# Patient Record
Sex: Female | Born: 1960
Health system: Southern US, Community
[De-identification: ages and names within clinical notes are randomized; demographics above are authoritative.]

## PROBLEM LIST (undated history)

## (undated) DIAGNOSIS — I639 Cerebral infarction, unspecified: Secondary | ICD-10-CM

## (undated) DIAGNOSIS — M797 Fibromyalgia: Secondary | ICD-10-CM

## (undated) DIAGNOSIS — R569 Unspecified convulsions: Secondary | ICD-10-CM

## (undated) DIAGNOSIS — K219 Gastro-esophageal reflux disease without esophagitis: Secondary | ICD-10-CM

## (undated) DIAGNOSIS — J45909 Unspecified asthma, uncomplicated: Secondary | ICD-10-CM

## (undated) DIAGNOSIS — F988 Other specified behavioral and emotional disorders with onset usually occurring in childhood and adolescence: Secondary | ICD-10-CM

## (undated) DIAGNOSIS — J301 Allergic rhinitis due to pollen: Secondary | ICD-10-CM

## (undated) DIAGNOSIS — F329 Major depressive disorder, single episode, unspecified: Secondary | ICD-10-CM

## (undated) DIAGNOSIS — B370 Candidal stomatitis: Secondary | ICD-10-CM

## (undated) DIAGNOSIS — D649 Anemia, unspecified: Secondary | ICD-10-CM

## (undated) DIAGNOSIS — G039 Meningitis, unspecified: Secondary | ICD-10-CM

## (undated) DIAGNOSIS — H7290 Unspecified perforation of tympanic membrane, unspecified ear: Secondary | ICD-10-CM

## (undated) DIAGNOSIS — F3289 Other specified depressive episodes: Secondary | ICD-10-CM

## (undated) DIAGNOSIS — R51 Headache: Secondary | ICD-10-CM

## (undated) DIAGNOSIS — M654 Radial styloid tenosynovitis [de Quervain]: Secondary | ICD-10-CM

## (undated) DIAGNOSIS — T8859XA Other complications of anesthesia, initial encounter: Secondary | ICD-10-CM

## (undated) DIAGNOSIS — F419 Anxiety disorder, unspecified: Secondary | ICD-10-CM

## (undated) DIAGNOSIS — N6019 Diffuse cystic mastopathy of unspecified breast: Secondary | ICD-10-CM

## (undated) DIAGNOSIS — K589 Irritable bowel syndrome without diarrhea: Secondary | ICD-10-CM

## (undated) DIAGNOSIS — H4089 Other specified glaucoma: Secondary | ICD-10-CM

## (undated) DIAGNOSIS — R296 Repeated falls: Secondary | ICD-10-CM

## (undated) DIAGNOSIS — S8410XA Injury of peroneal nerve at lower leg level, unspecified leg, initial encounter: Secondary | ICD-10-CM

## (undated) DIAGNOSIS — T7840XA Allergy, unspecified, initial encounter: Secondary | ICD-10-CM

## (undated) DIAGNOSIS — W19XXXA Unspecified fall, initial encounter: Secondary | ICD-10-CM

## (undated) DIAGNOSIS — K602 Anal fissure, unspecified: Secondary | ICD-10-CM

## (undated) DIAGNOSIS — T4145XA Adverse effect of unspecified anesthetic, initial encounter: Secondary | ICD-10-CM

## (undated) DIAGNOSIS — B019 Varicella without complication: Secondary | ICD-10-CM

## (undated) DIAGNOSIS — H269 Unspecified cataract: Secondary | ICD-10-CM

## (undated) DIAGNOSIS — J069 Acute upper respiratory infection, unspecified: Secondary | ICD-10-CM

## (undated) DIAGNOSIS — Z8659 Personal history of other mental and behavioral disorders: Secondary | ICD-10-CM

## (undated) DIAGNOSIS — N63 Unspecified lump in unspecified breast: Secondary | ICD-10-CM

## (undated) HISTORY — DX: Headache: R51

## (undated) HISTORY — DX: Unspecified perforation of tympanic membrane, unspecified ear: H72.90

## (undated) HISTORY — DX: Other specified glaucoma: H40.89

## (undated) HISTORY — PX: EYE SURGERY: SHX253

## (undated) HISTORY — DX: Injury of peroneal nerve at lower leg level, unspecified leg, initial encounter: S84.10XA

## (undated) HISTORY — DX: Cerebral infarction, unspecified: I63.9

## (undated) HISTORY — DX: Unspecified lump in unspecified breast: N63.0

## (undated) HISTORY — PX: BREAST SURGERY: SHX581

## (undated) HISTORY — DX: Anal fissure, unspecified: K60.2

## (undated) HISTORY — DX: Unspecified convulsions: R56.9

## (undated) HISTORY — PX: ABDOMINAL HYSTERECTOMY: SHX81

## (undated) HISTORY — DX: Diffuse cystic mastopathy of unspecified breast: N60.19

## (undated) HISTORY — DX: Meningitis, unspecified: G03.9

## (undated) HISTORY — DX: Allergy, unspecified, initial encounter: T78.40XA

## (undated) HISTORY — PX: TUBAL LIGATION: SHX77

## (undated) HISTORY — PX: INNER EAR SURGERY: SHX679

## (undated) HISTORY — DX: Allergic rhinitis due to pollen: J30.1

## (undated) HISTORY — PX: ADENOIDECTOMY: SUR15

## (undated) HISTORY — DX: Unspecified asthma, uncomplicated: J45.909

## (undated) HISTORY — DX: Other specified depressive episodes: F32.89

## (undated) HISTORY — DX: Gastro-esophageal reflux disease without esophagitis: K21.9

## (undated) HISTORY — DX: Acute upper respiratory infection, unspecified: J06.9

## (undated) HISTORY — DX: Major depressive disorder, single episode, unspecified: F32.9

## (undated) HISTORY — DX: Personal history of other mental and behavioral disorders: Z86.59

## (undated) HISTORY — PX: COSMETIC SURGERY: SHX468

## (undated) HISTORY — DX: Candidal stomatitis: B37.0

## (undated) HISTORY — DX: Irritable bowel syndrome, unspecified: K58.9

## (undated) HISTORY — DX: Repeated falls: R29.6

## (undated) HISTORY — DX: Varicella without complication: B01.9

## (undated) HISTORY — DX: Fibromyalgia: M79.7

## (undated) HISTORY — DX: Unspecified cataract: H26.9

## (undated) HISTORY — DX: Unspecified fall, initial encounter: W19.XXXA

## (undated) HISTORY — DX: Radial styloid tenosynovitis (de quervain): M65.4

## (undated) HISTORY — DX: Anxiety disorder, unspecified: F41.9

## (undated) HISTORY — DX: Other specified behavioral and emotional disorders with onset usually occurring in childhood and adolescence: F98.8

## (undated) HISTORY — DX: Anemia, unspecified: D64.9

---

## 1990-02-20 HISTORY — PX: ANKLE SURGERY: SHX546

## 1991-02-21 HISTORY — PX: LAPAROSCOPY: SHX197

## 1997-06-08 ENCOUNTER — Ambulatory Visit (HOSPITAL_COMMUNITY): Admission: RE | Admit: 1997-06-08 | Discharge: 1997-06-08 | Payer: Self-pay | Admitting: Gynecology

## 1999-02-21 HISTORY — PX: WRIST SURGERY: SHX841

## 2004-02-21 HISTORY — PX: DILATION AND CURETTAGE OF UTERUS: SHX78

## 2006-01-23 LAB — CONVERTED CEMR LAB: Pap Smear: NORMAL

## 2006-05-28 ENCOUNTER — Ambulatory Visit: Payer: Self-pay | Admitting: Internal Medicine

## 2006-05-28 LAB — CONVERTED CEMR LAB
ALT: 10 units/L (ref 0–40)
AST: 19 units/L (ref 0–37)
Albumin: 3.7 g/dL (ref 3.5–5.2)
Alkaline Phosphatase: 32 units/L — ABNORMAL LOW (ref 39–117)
BUN: 12 mg/dL (ref 6–23)
Basophils Absolute: 0.1 10*3/uL (ref 0.0–0.1)
Basophils Relative: 1.4 % — ABNORMAL HIGH (ref 0.0–1.0)
Bilirubin Urine: NEGATIVE
Bilirubin, Direct: 0.1 mg/dL (ref 0.0–0.3)
CO2: 28 meq/L (ref 19–32)
Calcium: 9.3 mg/dL (ref 8.4–10.5)
Chloride: 109 meq/L (ref 96–112)
Cholesterol: 135 mg/dL (ref 0–200)
Creatinine, Ser: 0.9 mg/dL (ref 0.4–1.2)
Crystals: NEGATIVE
Eosinophils Absolute: 0.3 10*3/uL (ref 0.0–0.6)
Eosinophils Relative: 6.2 % — ABNORMAL HIGH (ref 0.0–5.0)
GFR calc Af Amer: 87 mL/min
GFR calc non Af Amer: 72 mL/min
Glucose, Bld: 93 mg/dL (ref 70–99)
HCT: 40 % (ref 36.0–46.0)
HDL: 50.3 mg/dL (ref >39.0)
Hemoglobin: 13.8 g/dL (ref 12.0–15.0)
Ketones, ur: NEGATIVE mg/dL
LDL Cholesterol: 65 mg/dL (ref 0–99)
Leukocytes, UA: NEGATIVE
Lymphocytes Relative: 27.7 % (ref 12.0–46.0)
MCHC: 34.6 g/dL (ref 30.0–36.0)
MCV: 97 fL (ref 78.0–100.0)
Monocytes Absolute: 0.5 10*3/uL (ref 0.2–0.7)
Monocytes Relative: 9.5 % (ref 3.0–11.0)
Neutro Abs: 2.6 10*3/uL (ref 1.4–7.7)
Neutrophils Relative %: 55.2 % (ref 43.0–77.0)
Nitrite: NEGATIVE
Platelets: 199 10*3/uL (ref 150–400)
Potassium: 3.9 meq/L (ref 3.5–5.1)
RBC: 4.12 M/uL (ref 3.87–5.11)
RDW: 12.1 % (ref 11.5–14.6)
Sodium: 140 meq/L (ref 135–145)
Specific Gravity, Urine: 1.025 (ref 1.000–1.03)
TSH: 2.15 microintl units/mL (ref 0.35–5.50)
Total Bilirubin: 0.3 mg/dL (ref 0.3–1.2)
Total CHOL/HDL Ratio: 2.7
Total Protein, Urine: NEGATIVE mg/dL
Total Protein: 6.2 g/dL (ref 6.0–8.3)
Triglycerides: 97 mg/dL (ref 0–149)
Urine Glucose: NEGATIVE mg/dL
Urobilinogen, UA: 0.2 (ref 0.0–1.0)
VLDL: 19 mg/dL (ref 0–40)
WBC: 4.8 10*3/uL (ref 4.5–10.5)
pH: 6 (ref 5.0–8.0)

## 2006-06-04 ENCOUNTER — Ambulatory Visit: Payer: Self-pay | Admitting: Internal Medicine

## 2006-07-10 ENCOUNTER — Ambulatory Visit: Payer: Self-pay | Admitting: Internal Medicine

## 2006-09-27 ENCOUNTER — Ambulatory Visit: Payer: Self-pay | Admitting: Internal Medicine

## 2006-09-27 LAB — CONVERTED CEMR LAB
Amylase: 40 units/L (ref 27–131)
Basophils Absolute: 0 10*3/uL (ref 0.0–0.1)
Basophils Relative: 0.7 % (ref 0.0–1.0)
Eosinophils Absolute: 0.3 10*3/uL (ref 0.0–0.6)
Eosinophils Relative: 4 % (ref 0.0–5.0)
HCT: 41.9 % (ref 36.0–46.0)
Hemoglobin: 14.7 g/dL (ref 12.0–15.0)
Lipase: 21 units/L (ref 11.0–59.0)
Lymphocytes Relative: 23.6 % (ref 12.0–46.0)
MCHC: 35 g/dL (ref 30.0–36.0)
MCV: 96.4 fL (ref 78.0–100.0)
Monocytes Absolute: 0.7 10*3/uL (ref 0.2–0.7)
Monocytes Relative: 9.6 % (ref 3.0–11.0)
Neutro Abs: 4.3 10*3/uL (ref 1.4–7.7)
Neutrophils Relative %: 62.1 % (ref 43.0–77.0)
Platelets: 219 10*3/uL (ref 150–400)
RBC: 4.35 M/uL (ref 3.87–5.11)
RDW: 12 % (ref 11.5–14.6)
WBC: 7 10*3/uL (ref 4.5–10.5)

## 2006-10-18 ENCOUNTER — Encounter: Admission: RE | Admit: 2006-10-18 | Discharge: 2006-10-18 | Payer: Self-pay | Admitting: Internal Medicine

## 2006-12-05 ENCOUNTER — Ambulatory Visit: Payer: Self-pay | Admitting: Internal Medicine

## 2006-12-05 ENCOUNTER — Encounter: Payer: Self-pay | Admitting: Internal Medicine

## 2006-12-05 DIAGNOSIS — H4089 Other specified glaucoma: Secondary | ICD-10-CM | POA: Insufficient documentation

## 2006-12-05 DIAGNOSIS — F339 Major depressive disorder, recurrent, unspecified: Secondary | ICD-10-CM | POA: Insufficient documentation

## 2006-12-05 DIAGNOSIS — N6019 Diffuse cystic mastopathy of unspecified breast: Secondary | ICD-10-CM | POA: Insufficient documentation

## 2006-12-05 DIAGNOSIS — Z8659 Personal history of other mental and behavioral disorders: Secondary | ICD-10-CM | POA: Insufficient documentation

## 2006-12-05 DIAGNOSIS — Z9189 Other specified personal risk factors, not elsewhere classified: Secondary | ICD-10-CM | POA: Insufficient documentation

## 2006-12-05 DIAGNOSIS — F329 Major depressive disorder, single episode, unspecified: Secondary | ICD-10-CM

## 2006-12-05 DIAGNOSIS — H7290 Unspecified perforation of tympanic membrane, unspecified ear: Secondary | ICD-10-CM | POA: Insufficient documentation

## 2006-12-05 DIAGNOSIS — S8410XA Injury of peroneal nerve at lower leg level, unspecified leg, initial encounter: Secondary | ICD-10-CM | POA: Insufficient documentation

## 2006-12-05 DIAGNOSIS — G039 Meningitis, unspecified: Secondary | ICD-10-CM | POA: Insufficient documentation

## 2006-12-05 DIAGNOSIS — R519 Headache, unspecified: Secondary | ICD-10-CM | POA: Insufficient documentation

## 2006-12-05 DIAGNOSIS — D649 Anemia, unspecified: Secondary | ICD-10-CM | POA: Insufficient documentation

## 2006-12-05 DIAGNOSIS — Z9089 Acquired absence of other organs: Secondary | ICD-10-CM | POA: Insufficient documentation

## 2006-12-05 DIAGNOSIS — R51 Headache: Secondary | ICD-10-CM

## 2006-12-05 DIAGNOSIS — M654 Radial styloid tenosynovitis [de Quervain]: Secondary | ICD-10-CM | POA: Insufficient documentation

## 2006-12-05 HISTORY — DX: Major depressive disorder, recurrent, unspecified: F33.9

## 2006-12-05 HISTORY — DX: Unspecified perforation of tympanic membrane, unspecified ear: H72.90

## 2006-12-11 ENCOUNTER — Encounter: Admission: RE | Admit: 2006-12-11 | Discharge: 2006-12-11 | Payer: Self-pay | Admitting: Internal Medicine

## 2006-12-21 ENCOUNTER — Encounter: Payer: Self-pay | Admitting: Internal Medicine

## 2007-01-22 ENCOUNTER — Telehealth: Payer: Self-pay | Admitting: Internal Medicine

## 2007-01-22 ENCOUNTER — Ambulatory Visit: Payer: Self-pay | Admitting: Internal Medicine

## 2007-01-22 LAB — CONVERTED CEMR LAB: Streptococcus, Group A Screen (Direct): NEGATIVE

## 2007-02-09 ENCOUNTER — Telehealth (INDEPENDENT_AMBULATORY_CARE_PROVIDER_SITE_OTHER): Payer: Self-pay | Admitting: *Deleted

## 2007-02-18 DIAGNOSIS — J301 Allergic rhinitis due to pollen: Secondary | ICD-10-CM | POA: Insufficient documentation

## 2007-02-19 ENCOUNTER — Ambulatory Visit: Payer: Self-pay | Admitting: Internal Medicine

## 2007-02-19 DIAGNOSIS — M542 Cervicalgia: Secondary | ICD-10-CM

## 2007-02-19 HISTORY — DX: Cervicalgia: M54.2

## 2007-02-22 ENCOUNTER — Telehealth: Payer: Self-pay | Admitting: Internal Medicine

## 2007-03-22 ENCOUNTER — Telehealth: Payer: Self-pay | Admitting: Internal Medicine

## 2007-04-01 ENCOUNTER — Telehealth: Payer: Self-pay | Admitting: Internal Medicine

## 2007-04-25 ENCOUNTER — Ambulatory Visit: Payer: Self-pay | Admitting: Internal Medicine

## 2007-04-25 DIAGNOSIS — F410 Panic disorder [episodic paroxysmal anxiety] without agoraphobia: Secondary | ICD-10-CM | POA: Insufficient documentation

## 2007-04-25 DIAGNOSIS — G5600 Carpal tunnel syndrome, unspecified upper limb: Secondary | ICD-10-CM

## 2007-04-25 DIAGNOSIS — F40240 Claustrophobia: Secondary | ICD-10-CM | POA: Insufficient documentation

## 2007-04-25 HISTORY — DX: Carpal tunnel syndrome, unspecified upper limb: G56.00

## 2007-04-26 ENCOUNTER — Telehealth: Payer: Self-pay | Admitting: Internal Medicine

## 2007-05-15 ENCOUNTER — Encounter: Payer: Self-pay | Admitting: Internal Medicine

## 2007-06-25 ENCOUNTER — Telehealth: Payer: Self-pay | Admitting: Internal Medicine

## 2007-06-25 ENCOUNTER — Encounter: Payer: Self-pay | Admitting: Internal Medicine

## 2007-06-26 ENCOUNTER — Encounter: Payer: Self-pay | Admitting: Internal Medicine

## 2007-07-03 ENCOUNTER — Encounter: Admission: RE | Admit: 2007-07-03 | Discharge: 2007-07-03 | Payer: Self-pay | Admitting: Internal Medicine

## 2007-07-03 ENCOUNTER — Encounter (INDEPENDENT_AMBULATORY_CARE_PROVIDER_SITE_OTHER): Payer: Self-pay | Admitting: *Deleted

## 2007-07-04 ENCOUNTER — Telehealth: Payer: Self-pay | Admitting: Internal Medicine

## 2007-07-09 ENCOUNTER — Telehealth: Payer: Self-pay | Admitting: Internal Medicine

## 2007-07-10 ENCOUNTER — Telehealth (INDEPENDENT_AMBULATORY_CARE_PROVIDER_SITE_OTHER): Payer: Self-pay | Admitting: *Deleted

## 2007-07-12 ENCOUNTER — Encounter: Payer: Self-pay | Admitting: Internal Medicine

## 2007-07-22 ENCOUNTER — Ambulatory Visit (HOSPITAL_COMMUNITY): Admission: RE | Admit: 2007-07-22 | Discharge: 2007-07-22 | Payer: Self-pay | Admitting: Neurology

## 2007-09-16 ENCOUNTER — Encounter: Payer: Self-pay | Admitting: Gastroenterology

## 2007-09-23 ENCOUNTER — Encounter: Payer: Self-pay | Admitting: Internal Medicine

## 2007-10-02 ENCOUNTER — Encounter: Payer: Self-pay | Admitting: Internal Medicine

## 2007-10-17 ENCOUNTER — Encounter: Admission: RE | Admit: 2007-10-17 | Discharge: 2007-10-17 | Payer: Self-pay | Admitting: Psychiatry

## 2007-11-20 ENCOUNTER — Telehealth: Payer: Self-pay | Admitting: Internal Medicine

## 2007-11-21 ENCOUNTER — Telehealth: Payer: Self-pay | Admitting: Internal Medicine

## 2007-12-05 ENCOUNTER — Telehealth: Payer: Self-pay | Admitting: Internal Medicine

## 2007-12-06 ENCOUNTER — Ambulatory Visit: Payer: Self-pay | Admitting: Internal Medicine

## 2007-12-26 ENCOUNTER — Telehealth: Payer: Self-pay | Admitting: Internal Medicine

## 2008-01-01 ENCOUNTER — Encounter: Payer: Self-pay | Admitting: Internal Medicine

## 2008-02-03 ENCOUNTER — Telehealth: Payer: Self-pay | Admitting: Internal Medicine

## 2008-02-24 ENCOUNTER — Ambulatory Visit: Payer: Self-pay | Admitting: Internal Medicine

## 2008-02-24 LAB — CONVERTED CEMR LAB
ALT: 13 units/L (ref 0–35)
AST: 21 units/L (ref 0–37)
Albumin: 3.9 g/dL (ref 3.5–5.2)
Alkaline Phosphatase: 33 units/L — ABNORMAL LOW (ref 39–117)
BUN: 10 mg/dL (ref 6–23)
Basophils Absolute: 0 10*3/uL (ref 0.0–0.1)
Basophils Relative: 1 % (ref 0.0–3.0)
Bilirubin Urine: NEGATIVE
Bilirubin, Direct: 0.1 mg/dL (ref 0.0–0.3)
CO2: 29 meq/L (ref 19–32)
Calcium: 8.7 mg/dL (ref 8.4–10.5)
Chloride: 109 meq/L (ref 96–112)
Cholesterol: 133 mg/dL (ref 0–200)
Creatinine, Ser: 1 mg/dL (ref 0.4–1.2)
Eosinophils Absolute: 0.2 10*3/uL (ref 0.0–0.7)
Eosinophils Relative: 4.7 % (ref 0.0–5.0)
GFR calc Af Amer: 76 mL/min
GFR calc non Af Amer: 63 mL/min
Glucose, Bld: 92 mg/dL (ref 70–99)
HCT: 38.2 % (ref 36.0–46.0)
HDL: 54.5 mg/dL (ref >39.0)
Hemoglobin, Urine: NEGATIVE
Hemoglobin: 13.5 g/dL (ref 12.0–15.0)
Ketones, ur: NEGATIVE mg/dL
LDL Cholesterol: 67 mg/dL (ref 0–99)
Leukocytes, UA: NEGATIVE
Lymphocytes Relative: 23.3 % (ref 12.0–46.0)
MCHC: 35.4 g/dL (ref 30.0–36.0)
MCV: 98.8 fL (ref 78.0–100.0)
Monocytes Absolute: 0.4 10*3/uL (ref 0.1–1.0)
Monocytes Relative: 9.1 % (ref 3.0–12.0)
Neutro Abs: 3.2 10*3/uL (ref 1.4–7.7)
Neutrophils Relative %: 61.9 % (ref 43.0–77.0)
Nitrite: NEGATIVE
Platelets: 164 10*3/uL (ref 150–400)
Potassium: 4.2 meq/L (ref 3.5–5.1)
RBC: 3.87 M/uL (ref 3.87–5.11)
RDW: 11.6 % (ref 11.5–14.6)
Sodium: 142 meq/L (ref 135–145)
Specific Gravity, Urine: 1.01 (ref 1.000–1.03)
TSH: 2.07 microintl units/mL (ref 0.35–5.50)
Total Bilirubin: 0.5 mg/dL (ref 0.3–1.2)
Total CHOL/HDL Ratio: 2.4
Total Protein, Urine: NEGATIVE mg/dL
Total Protein: 6.1 g/dL (ref 6.0–8.3)
Triglycerides: 60 mg/dL (ref 0–149)
Urine Glucose: NEGATIVE mg/dL
Urobilinogen, UA: 0.2 (ref 0.0–1.0)
VLDL: 12 mg/dL (ref 0–40)
WBC: 4.9 10*3/uL (ref 4.5–10.5)
pH: 7 (ref 5.0–8.0)

## 2008-02-28 ENCOUNTER — Encounter: Payer: Self-pay | Admitting: Internal Medicine

## 2008-02-28 ENCOUNTER — Other Ambulatory Visit: Admission: RE | Admit: 2008-02-28 | Discharge: 2008-02-28 | Payer: Self-pay | Admitting: Internal Medicine

## 2008-02-28 ENCOUNTER — Ambulatory Visit: Payer: Self-pay | Admitting: Internal Medicine

## 2008-02-28 LAB — HM PAP SMEAR

## 2008-03-04 ENCOUNTER — Telehealth: Payer: Self-pay | Admitting: Internal Medicine

## 2008-03-08 ENCOUNTER — Encounter: Payer: Self-pay | Admitting: Internal Medicine

## 2008-03-20 ENCOUNTER — Telehealth: Payer: Self-pay | Admitting: Internal Medicine

## 2008-04-07 ENCOUNTER — Encounter: Payer: Self-pay | Admitting: Internal Medicine

## 2008-04-13 ENCOUNTER — Telehealth: Payer: Self-pay | Admitting: Internal Medicine

## 2008-04-16 ENCOUNTER — Ambulatory Visit: Payer: Self-pay | Admitting: Internal Medicine

## 2008-04-28 ENCOUNTER — Emergency Department (HOSPITAL_COMMUNITY): Admission: EM | Admit: 2008-04-28 | Discharge: 2008-04-28 | Payer: Self-pay | Admitting: Emergency Medicine

## 2008-04-28 ENCOUNTER — Telehealth: Payer: Self-pay | Admitting: Internal Medicine

## 2008-06-06 ENCOUNTER — Encounter: Admission: RE | Admit: 2008-06-06 | Discharge: 2008-06-06 | Payer: Self-pay | Admitting: *Deleted

## 2008-06-30 ENCOUNTER — Telehealth: Payer: Self-pay | Admitting: Internal Medicine

## 2008-07-27 ENCOUNTER — Encounter: Payer: Self-pay | Admitting: Internal Medicine

## 2008-07-28 ENCOUNTER — Telehealth: Payer: Self-pay | Admitting: Internal Medicine

## 2008-08-10 ENCOUNTER — Telehealth (INDEPENDENT_AMBULATORY_CARE_PROVIDER_SITE_OTHER): Payer: Self-pay | Admitting: *Deleted

## 2008-08-11 ENCOUNTER — Ambulatory Visit: Payer: Self-pay | Admitting: Internal Medicine

## 2008-08-27 ENCOUNTER — Ambulatory Visit: Payer: Self-pay | Admitting: Internal Medicine

## 2008-08-27 ENCOUNTER — Telehealth: Payer: Self-pay | Admitting: Internal Medicine

## 2008-08-27 LAB — CONVERTED CEMR LAB
HCT: 43.6 % (ref 36.0–46.0)
Hemoglobin: 15.2 g/dL — ABNORMAL HIGH (ref 12.0–15.0)

## 2008-09-12 ENCOUNTER — Encounter: Admission: RE | Admit: 2008-09-12 | Discharge: 2008-09-12 | Payer: Self-pay | Admitting: *Deleted

## 2008-10-14 ENCOUNTER — Telehealth: Payer: Self-pay | Admitting: Internal Medicine

## 2008-10-22 ENCOUNTER — Ambulatory Visit: Payer: Self-pay | Admitting: Internal Medicine

## 2008-10-28 ENCOUNTER — Telehealth (INDEPENDENT_AMBULATORY_CARE_PROVIDER_SITE_OTHER): Payer: Self-pay | Admitting: *Deleted

## 2008-12-24 ENCOUNTER — Ambulatory Visit: Payer: Self-pay | Admitting: Internal Medicine

## 2009-02-20 HISTORY — PX: OTHER SURGICAL HISTORY: SHX169

## 2009-03-03 ENCOUNTER — Encounter: Payer: Self-pay | Admitting: Internal Medicine

## 2009-04-02 ENCOUNTER — Encounter: Payer: Self-pay | Admitting: Internal Medicine

## 2009-05-06 ENCOUNTER — Encounter: Payer: Self-pay | Admitting: Internal Medicine

## 2009-05-10 ENCOUNTER — Telehealth: Payer: Self-pay | Admitting: Internal Medicine

## 2009-05-13 ENCOUNTER — Encounter: Payer: Self-pay | Admitting: Internal Medicine

## 2009-05-14 ENCOUNTER — Telehealth: Payer: Self-pay | Admitting: Internal Medicine

## 2009-05-14 DIAGNOSIS — J330 Polyp of nasal cavity: Secondary | ICD-10-CM | POA: Insufficient documentation

## 2009-05-14 HISTORY — DX: Polyp of nasal cavity: J33.0

## 2009-05-20 ENCOUNTER — Encounter: Payer: Self-pay | Admitting: Internal Medicine

## 2009-06-01 ENCOUNTER — Ambulatory Visit: Payer: Self-pay | Admitting: Internal Medicine

## 2009-06-01 DIAGNOSIS — N3942 Incontinence without sensory awareness: Secondary | ICD-10-CM

## 2009-06-01 HISTORY — DX: Incontinence without sensory awareness: N39.42

## 2009-06-02 DIAGNOSIS — G35 Multiple sclerosis: Secondary | ICD-10-CM | POA: Insufficient documentation

## 2009-06-03 ENCOUNTER — Telehealth: Payer: Self-pay | Admitting: Internal Medicine

## 2009-06-03 ENCOUNTER — Encounter: Payer: Self-pay | Admitting: Internal Medicine

## 2009-06-03 LAB — CONVERTED CEMR LAB
5-HIAA, 24 Hr Urine: 1.8 mg/(24.h) (ref <=6.0)
Metaneph Total, Ur: 124 ug/(24.h) — ABNORMAL LOW (ref 182–739)
Metanephrines, Ur: 21 — ABNORMAL LOW (ref 58–203)
Normetanephrine, 24H Ur: 103 (ref 88–649)

## 2009-06-07 ENCOUNTER — Telehealth: Payer: Self-pay | Admitting: Internal Medicine

## 2009-06-07 ENCOUNTER — Ambulatory Visit: Payer: Self-pay | Admitting: Internal Medicine

## 2009-06-07 LAB — CONVERTED CEMR LAB
Bilirubin Urine: NEGATIVE
Hemoglobin, Urine: NEGATIVE
Ketones, ur: NEGATIVE mg/dL
Leukocytes, UA: NEGATIVE
Nitrite: NEGATIVE
Specific Gravity, Urine: 1.015 (ref 1.000–1.030)
Total Protein, Urine: NEGATIVE mg/dL
Urine Glucose: NEGATIVE mg/dL
Urobilinogen, UA: 0.2 (ref 0.0–1.0)
pH: 7 (ref 5.0–8.0)

## 2009-06-10 ENCOUNTER — Telehealth: Payer: Self-pay | Admitting: Internal Medicine

## 2009-06-13 ENCOUNTER — Encounter: Payer: Self-pay | Admitting: Internal Medicine

## 2009-06-16 ENCOUNTER — Telehealth: Payer: Self-pay | Admitting: Internal Medicine

## 2009-06-20 ENCOUNTER — Encounter: Admission: RE | Admit: 2009-06-20 | Discharge: 2009-06-20 | Payer: Self-pay | Admitting: Psychiatry

## 2009-06-21 ENCOUNTER — Ambulatory Visit: Payer: Self-pay | Admitting: Internal Medicine

## 2009-06-21 DIAGNOSIS — R1031 Right lower quadrant pain: Secondary | ICD-10-CM | POA: Insufficient documentation

## 2009-06-21 LAB — CONVERTED CEMR LAB
BUN: 11 mg/dL (ref 6–23)
CO2: 30 meq/L (ref 19–32)
Calcium: 9.2 mg/dL (ref 8.4–10.5)
Chloride: 106 meq/L (ref 96–112)
Creatinine, Ser: 0.9 mg/dL (ref 0.4–1.2)
GFR calc non Af Amer: 70.76 mL/min (ref >60)
Glucose, Bld: 75 mg/dL (ref 70–99)
Potassium: 4.9 meq/L (ref 3.5–5.1)
Sodium: 141 meq/L (ref 135–145)

## 2009-06-23 ENCOUNTER — Ambulatory Visit: Payer: Self-pay | Admitting: Cardiology

## 2009-06-25 ENCOUNTER — Telehealth: Payer: Self-pay | Admitting: Internal Medicine

## 2009-06-29 ENCOUNTER — Encounter (INDEPENDENT_AMBULATORY_CARE_PROVIDER_SITE_OTHER): Payer: Self-pay | Admitting: *Deleted

## 2009-06-30 ENCOUNTER — Encounter: Payer: Self-pay | Admitting: Internal Medicine

## 2009-07-16 ENCOUNTER — Encounter: Payer: Self-pay | Admitting: Internal Medicine

## 2009-07-28 ENCOUNTER — Encounter: Payer: Self-pay | Admitting: Internal Medicine

## 2009-07-28 ENCOUNTER — Telehealth: Payer: Self-pay | Admitting: Internal Medicine

## 2009-07-29 ENCOUNTER — Telehealth: Payer: Self-pay | Admitting: Internal Medicine

## 2009-08-02 ENCOUNTER — Ambulatory Visit: Payer: Self-pay | Admitting: Gastroenterology

## 2009-08-02 ENCOUNTER — Telehealth: Payer: Self-pay | Admitting: Internal Medicine

## 2009-08-02 DIAGNOSIS — R110 Nausea: Secondary | ICD-10-CM | POA: Insufficient documentation

## 2009-08-03 ENCOUNTER — Encounter: Payer: Self-pay | Admitting: Internal Medicine

## 2009-08-03 ENCOUNTER — Telehealth: Payer: Self-pay | Admitting: Internal Medicine

## 2009-08-03 ENCOUNTER — Ambulatory Visit (HOSPITAL_COMMUNITY): Admission: RE | Admit: 2009-08-03 | Discharge: 2009-08-03 | Payer: Self-pay | Admitting: Internal Medicine

## 2009-08-03 ENCOUNTER — Encounter: Payer: Self-pay | Admitting: Gastroenterology

## 2009-08-04 ENCOUNTER — Telehealth: Payer: Self-pay | Admitting: Internal Medicine

## 2009-08-05 ENCOUNTER — Encounter: Payer: Self-pay | Admitting: Internal Medicine

## 2009-08-05 ENCOUNTER — Telehealth: Payer: Self-pay | Admitting: Internal Medicine

## 2009-08-05 ENCOUNTER — Encounter: Admission: RE | Admit: 2009-08-05 | Discharge: 2009-08-05 | Payer: Self-pay | Admitting: Internal Medicine

## 2009-08-08 ENCOUNTER — Emergency Department (HOSPITAL_COMMUNITY): Admission: EM | Admit: 2009-08-08 | Discharge: 2009-08-08 | Payer: Self-pay | Admitting: Emergency Medicine

## 2009-08-09 ENCOUNTER — Telehealth: Payer: Self-pay | Admitting: Internal Medicine

## 2009-08-10 ENCOUNTER — Telehealth: Payer: Self-pay | Admitting: Gastroenterology

## 2009-08-10 ENCOUNTER — Telehealth: Payer: Self-pay | Admitting: Internal Medicine

## 2009-08-11 ENCOUNTER — Telehealth: Payer: Self-pay | Admitting: Internal Medicine

## 2009-08-16 ENCOUNTER — Telehealth: Payer: Self-pay | Admitting: Internal Medicine

## 2009-08-19 ENCOUNTER — Telehealth: Payer: Self-pay | Admitting: Internal Medicine

## 2009-08-20 ENCOUNTER — Ambulatory Visit: Payer: Self-pay | Admitting: Internal Medicine

## 2009-08-20 LAB — CONVERTED CEMR LAB: T pallidum Antibodies (TP-PA): 0.05 (ref <0.90)

## 2009-09-01 ENCOUNTER — Encounter: Payer: Self-pay | Admitting: Internal Medicine

## 2009-09-06 ENCOUNTER — Telehealth: Payer: Self-pay | Admitting: Internal Medicine

## 2009-09-13 ENCOUNTER — Ambulatory Visit: Payer: Self-pay | Admitting: Internal Medicine

## 2009-09-20 ENCOUNTER — Encounter: Payer: Self-pay | Admitting: Internal Medicine

## 2009-09-21 ENCOUNTER — Encounter: Payer: Self-pay | Admitting: Internal Medicine

## 2009-09-23 ENCOUNTER — Telehealth: Payer: Self-pay | Admitting: Internal Medicine

## 2009-09-29 ENCOUNTER — Telehealth: Payer: Self-pay | Admitting: Gastroenterology

## 2009-10-19 ENCOUNTER — Telehealth: Payer: Self-pay | Admitting: Internal Medicine

## 2009-11-22 ENCOUNTER — Encounter: Admission: RE | Admit: 2009-11-22 | Discharge: 2009-11-22 | Payer: Self-pay | Admitting: Otolaryngology

## 2010-01-05 ENCOUNTER — Encounter: Payer: Self-pay | Admitting: Internal Medicine

## 2010-01-31 ENCOUNTER — Encounter
Admission: RE | Admit: 2010-01-31 | Discharge: 2010-01-31 | Payer: Self-pay | Source: Home / Self Care | Attending: Internal Medicine | Admitting: Internal Medicine

## 2010-01-31 LAB — HM MAMMOGRAPHY: HM Mammogram: NEGATIVE

## 2010-02-03 ENCOUNTER — Telehealth: Payer: Self-pay | Admitting: Internal Medicine

## 2010-02-17 ENCOUNTER — Ambulatory Visit
Admission: RE | Admit: 2010-02-17 | Discharge: 2010-02-17 | Payer: Self-pay | Source: Home / Self Care | Attending: Internal Medicine | Admitting: Internal Medicine

## 2010-03-12 ENCOUNTER — Encounter: Payer: Self-pay | Admitting: Internal Medicine

## 2010-03-22 NOTE — Consult Note (Signed)
[facility]: [facility]   Imported By: Sherian Rein 09/07/2009 11:33:53  _____________________________________________________________________  External Attachment:    Type:   Image     Comment:   External Document

## 2010-03-22 NOTE — Assessment & Plan Note (Signed)
Summary: WANTS A SHOT IN HER NECK FOR PAIN/NWS   Vital Signs:  Patient profile:   50 year old female Height:      62 inches Weight:      117 pounds BMI:     21.48 O2 Sat:      98 % on Room air Temp:     98.5 degrees F oral Pulse rate:   66 / minute BP sitting:   106 / 72  (left arm) Cuff size:   regular  Vitals Entered By: Bill Salinas CMA (September 13, 2009 4:21 PM)  O2 Flow:  Room air CC: pt here with c/o neck pain/ ab   Primary Care Provider:  Bellany Elbaum  CC:  pt here with c/o neck pain/ ab.  History of Present Illness: patient presents for pain and spasm in her right trapezius area. This is a recurrent problem that has responded in the past to trigger point injections, which is what she seeks today.  She has become more calm. We repeated her RPR which was negative. Her neurologist has ordered a third set of labs. He has also prescribed penicillin for her. She tells me that she will be seeking s a reconsult from Dr. Sandria Manly for second opinion regarding her potential for MS.   Current Medications (verified): 1)  Wellbutrin Xl 300 Mg Xr24h-Tab (Bupropion Hcl) .... Take 1 Tablet By Mouth Once A Day 2)  Lexapro 20 Mg Tabs (Escitalopram Oxalate) .... Take 1 Tablet Once Daily 3)  Lunesta 3 Mg Tabs (Eszopiclone) .... Take 1 Tab By Mouth At Bedtime 4)  Multivitamins   Tabs (Multiple Vitamin) .... Take One Tablet Once Daily 5)  Lybrel 90-20 Mcg  Tabs (Levonorgestrel-Ethinyl Estrad) .... Take As Directed 6)  Alprazolam 0.5 Mg Tabs (Alprazolam) .Marland Kitchen.. 1 Tablet By Mouth Three Times A Day 7)  Gabapentin 300 Mg Caps (Gabapentin) .... Take 1 Tablet By Mouth Three Times A Day and 2 Tabs At Bedtime 8)  Savella 50 Mg Tabs (Milnacipran Hcl) .Marland Kitchen.. 1 Tablet By Mouth Two Times A Day 9)  Hydrocodone-Acetaminophen 5-325 Mg Tabs (Hydrocodone-Acetaminophen) .... Take 1 Tablet By Mouth Two Times A Day As Needed 10)  Baclofen 10 Mg Tabs (Baclofen) .... Take 1 Tab Q 8hrs As Needed For Muscle Spasms 11)  Provigil 200  Mg Tabs (Modafinil) .... 2 Tablet Once Daily As Needed 12)  Clonidine Hcl 0.1 Mg/24hr Ptwk (Clonidine Hcl) .Marland Kitchen.. 1 Patch Weekly As Directed 13)  Promethazine Hcl 12.5 Mg Tabs (Promethazine Hcl) .Marland Kitchen.. 1 Q 6 As Needed Nausea 14)  Tamsulosin Hcl 0.4 Mg Caps (Tamsulosin Hcl) .Marland Kitchen.. 1 Tab Daily  Allergies (verified): No Known Drug Allergies  Past History:  Past Medical History: Last updated: 08/02/2009 HEADACHE (ICD-784.0) DEPRESSION (ICD-311) ANEMIA-NOS (ICD-285.9) Hx of BULIMIA, HX OF (ICD-V11.8) Hx of ANOREXIA, HX OF (ICD-V15.89) MENINGITIS NOS (ICD-322.9) GLAUCOMA NEC (ICD-365.89) HAY FEVER (ICD-477.0) UPPER RESPIRATORY INFECTION (URI) (ICD-465.9) FIBROCYSTIC BREAST DISEASE (ICD-610.1) LUMP OR MASS IN BREAST (ICD-611.72) TYMPANIC MEMBRANE PERFORATION (ICD-384.20) MENINGITIS NOS (ICD-322.9) GLAUCOMA NEC (ICD-365.89) Anal Fissure Anxiety Disorder Fibromyalgia Irritable Bowel Syndrome Seizures  Past Surgical History: Last updated: 02/18/2007 DILATION AND CURETTAGE, HX OF FOR MENOMETRORRHAGIA (ICD-V45.89) DE QUERVAIN'S TENOSYNOVITIS (ICD-727.04) INJURY, PERONEAL NERVE (ICD-956.3) ADENOIDECTOMY, HX OF (ICD-V45.79) TYMPANIC MEMBRANE PERFORATION (ICD-384.20)  Family History: Last updated: March 07, 2007 father-died 43, CHF,DM, HTN, LIPIDs, CVD mother - died 34, CAD/MI,HTN, ovarian cancer Maternal grandmother with breast cancer Neg- colon cancer.  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 No children No h/o abuse. Patient has never smoked.  Alcohol Use - yes 2-4/day Daily Caffeine Use 2/day Illicit Drug Use - no  Review of Systems       The patient complains of muscle weakness.  The patient denies anorexia, fever, weight loss, vision loss, chest pain, syncope, dyspnea on exertion, prolonged cough, abdominal pain, severe indigestion/heartburn, suspicious skin lesions,  difficulty walking, abnormal bleeding, and enlarged lymph nodes.    Physical Exam  General:  Well-developed,well-nourished,in no acute distress; alert,appropriate and cooperative throughout examination Eyes:  pupils equal, pupils round, corneas and lenses clear, and no injection.   Lungs:  normal respiratory effort and normal breath sounds.   Heart:  normal rate and regular rhythm.   Msk:  tender at the right trapezium region. No joint deformity noted.  Pulses:  2+ radial Neurologic:  alert & oriented X3 and gait normal.   Skin:  turgor normal and color normal.   Cervical Nodes:  no anterior cervical adenopathy and no posterior cervical adenopathy.   Psych:  Oriented X3, good eye contact, and not anxious appearing.     Impression & Recommendations:  Problem # 1:  NECK PAIN, CHRONIC (ICD-723.1)  did well with trigger pont injection.   Her updated medication list for this problem includes:    Hydrocodone-acetaminophen 5-325 Mg Tabs (Hydrocodone-acetaminophen) .Marland Kitchen... Take 1 tablet by mouth two times a day as needed    Baclofen 10 Mg Tabs (Baclofen) .Marland Kitchen... Take 1 tab q 8hrs as needed for muscle spasms  Orders: Trigger Point Injection (1 or 2 muscles) (11914) Depo- Medrol 40mg  (J1030)  Problem # 2:  MULTIPLE SCLEROSIS, PROGRESSIVE/RELAPSING (ICD-340) Records to Dr. Sandria Manly.   Complete Medication List: 1)  Wellbutrin Xl 300 Mg Xr24h-tab (Bupropion hcl) .... Take 1 tablet by mouth once a day 2)  Lexapro 20 Mg Tabs (Escitalopram oxalate) .... Take 1 tablet once daily 3)  Lunesta 3 Mg Tabs (Eszopiclone) .... Take 1 tab by mouth at bedtime 4)  Multivitamins Tabs (Multiple vitamin) .... Take one tablet once daily 5)  Lybrel 90-20 Mcg Tabs (Levonorgestrel-ethinyl estrad) .... Take as directed 6)  Alprazolam 0.5 Mg Tabs (Alprazolam) .Marland Kitchen.. 1 tablet by mouth three times a day 7)  Gabapentin 300 Mg Caps (Gabapentin) .... Take 1 tablet by mouth three times a day and 2 tabs at bedtime 8)  Savella 50  Mg Tabs (Milnacipran hcl) .Marland Kitchen.. 1 tablet by mouth two times a day 9)  Hydrocodone-acetaminophen 5-325 Mg Tabs (Hydrocodone-acetaminophen) .... Take 1 tablet by mouth two times a day as needed 10)  Baclofen 10 Mg Tabs (Baclofen) .... Take 1 tab q 8hrs as needed for muscle spasms 11)  Provigil 200 Mg Tabs (Modafinil) .... 2 tablet once daily as needed 12)  Clonidine Hcl 0.1 Mg/24hr Ptwk (Clonidine hcl) .Marland Kitchen.. 1 patch weekly as directed 13)  Promethazine Hcl 12.5 Mg Tabs (Promethazine hcl) .Marland Kitchen.. 1 q 6 as needed nausea 14)  Tamsulosin Hcl 0.4 Mg Caps (Tamsulosin hcl) .Marland Kitchen.. 1 tab daily   Procedure Note Last Tetanus: Historical (04/21/2005)  Injections: The patient complains of pain. Indication: acute pain  Procedure # 1: trigger point injection    Location: right trapezius region    Technique: 24 g needle    Medication: 40 mg depomedrol    Anesthesia: 2% xylocain    Comment: verbal consent obtained  Cleaned and prepped with: betadine Wound dressing: bandaid

## 2010-03-22 NOTE — Progress Notes (Signed)
Summary: Head injury  Phone Note Call from Patient Call back at Work Phone [phone] Call back at work [phone]   Caller: Patient Call For: Jacques Navy MD Summary of Call: Pt c/o headache, swelling, and blurred vision in right eye post trauma to head x 3 days ago, has been taking APAP and Ibuprofen w/o relief. Pt also requesting status of xray that was to be ordered since last OV. Please advise. Initial call taken by: Zackery Barefoot CMA,  April 28, 2008 10:26 AM  Follow-up for Phone Call        don't see what x-ray was to be ordered. For her blurred vision and headache will need OV...Marland KitchenMarland KitchenI guess tomorrow afternoon. Follow-up by: Jacques Navy MD,  April 28, 2008 1:19 PM  Additional Follow-up for Phone Call Additional follow up Details #1::        Patient notified and added to 04/29/08 schedule Additional Follow-up by: Rock Nephew CMA,  April 28, 2008 2:34 PM

## 2010-03-22 NOTE — Consult Note (Signed)
Summary: [facility]  [facility] [place]   Imported By: Maryln Gottron 05/28/2007 13:17:56  _____________________________________________________________________  External Attachment:    Type:   Image     Comment:   External Document

## 2010-03-22 NOTE — Progress Notes (Signed)
  Phone Note Call from Patient Call back at Work Phone [phone]   Summary of Call: Pt c/o upper abd pain, nausea & some vomiting x 3 days. Has had frequent black tarry stools today. She is eating a bland diet and taking in some liquids.  Initial call taken by: Lamar Sprinkles,  August 27, 2008 4:02 PM  Follow-up for Phone Call        [facility] office visit now per dr, with lab h & H 578.1 Pt informed  Follow-up by: Lamar Sprinkles,  August 27, 2008 4:09 PM

## 2010-03-22 NOTE — Letter (Signed)
Summary: symptoms compatible w MS/1 L periventricular white matter lesion  symptoms compatible w MS/1 L periventricular white matter lesion/[facility]   Imported By: Lester [place] 01/14/2008 08:15:13  _____________________________________________________________________  External Attachment:    Type:   Image     Comment:   External Document

## 2010-03-22 NOTE — Consult Note (Signed)
Summary: [facility]  [facility]   Imported By: Lennie Odor 09/24/2009 17:06:36  _____________________________________________________________________  External Attachment:    Type:   Image     Comment:   External Document

## 2010-03-22 NOTE — Progress Notes (Signed)
Summary: MRI  Phone Note Call from Patient   Caller: [phone] Summary of Call: Patient is requesting MRI for MS? Please schedule Initial call taken by: Lamar Sprinkles,  Jun 25, 2007 4:13 PM  Follow-up for Phone Call        OK to schedule. [facility] notified. Follow-up by: Jacques Navy MD,  Jun 26, 2007 8:17 AM

## 2010-03-22 NOTE — Progress Notes (Signed)
Summary: nasal polyp  Phone Note Call from Patient Call back at Home Phone [phone]   Summary of Call: Patient left message on triage that she [facility]. They made her aware that she has a nasal polyp that needs removal. Patient ? if she should come in for office visit or be referred to ENT. Patient is aware that MD will return on Monday. Please advise.  Patient also notes that she had to call the ambulance last week (Sun.) for what turned out to be a panic attack. Initial call taken by: Lucious Groves,  May 14, 2009 11:46 AM  Follow-up for Phone Call        refer directly to ENT. [facility] notified Follow-up by: Jacques Navy MD,  May 14, 2009 12:56 PM  New Problems: NASAL POLYP (ICD-471.0)   New Problems: NASAL POLYP (ICD-471.0)

## 2010-03-22 NOTE — Progress Notes (Signed)
Summary: MRI scan  Phone Note Call from Patient   Caller: Patient Call For: [phone] Summary of Call: Patient called requesting MRI scan to take to neuro MD. I returned call back o patint//lmovm to call or go by facility where scan was done. Initial call taken by: Rock Nephew CMA,  Jul 10, 2007 10:15 AM

## 2010-03-22 NOTE — Progress Notes (Signed)
Summary: req med or apt for poss strep  Phone Note Call from Patient Call back at Home Phone [phone]   Summary of Call: [facility] and was given augmentin for ear infection. She began to have ear pain and presure and [facility] her and she was advised to see her pcp asap. She was told to be checked for strep b/c of the sore throat. Pt is requesting an apt today.  Pt  is taking mucinex daily with some relief but is conserned about strep. Can we call in an antibiotic? or will you see her today? Initial call taken by: Lamar Sprinkles,  January 22, 2007 9:17 AM  Follow-up for Phone Call        Rapi strep test at the lab here today Follow-up by: Tresa Garter MD,  January 22, 2007 1:26 PM  Additional Follow-up for Phone Call Additional follow up Details #1::        Pt informed will call back with results Additional Follow-up by: Lamar Sprinkles,  January 22, 2007 2:20 PM  New Problems: UPPER RESPIRATORY INFECTION (URI) (ICD-465.9)   Additional Follow-up for Phone Call Additional follow up Details #2::    Please look at strep results and advise, Pt is also conserned about pain in her ear does she need an antibiotic?  Strep test is negative - use OTC meds. OV if not better. .................................................................Marland KitchenMarland KitchenTresa Garter MD  January 22, 2007 6:22 PM  Follow-up by: Lamar Sprinkles,  January 22, 2007 6:12 PM  New Problems: UPPER RESPIRATORY INFECTION (URI) (ICD-465.9)

## 2010-03-22 NOTE — Progress Notes (Signed)
  Phone Note Call from Patient      

## 2010-03-22 NOTE — Progress Notes (Signed)
Summary: RESULTS  Phone Note Call from Patient   Summary of Call: Patient is requesting results of LP. No results of FTA avail. Called LAB, they said this is still pending and will check w/lab and call me back.  Initial call taken by: Lamar Sprinkles, CMA,  August 16, 2009 3:59 PM  Follow-up for Phone Call        Results recieved and given to MD. FTA is non Reactive but lab notes that VDRL from CSF w/reflex to titer is reccomended. I spoke w/[facility] and the specimen has been discarded. Only way to do test is another LP. Please advise.  Follow-up by: Lamar Sprinkles, CMA,  August 16, 2009 4:25 PM  Additional Follow-up for Phone Call Additional follow up Details #1::        Let her and Dr. Lin Givens know that the CSF FTA was negative. Called at 1800 no ans. Did not leave msg. Needs call tomorrow. Additional Follow-up by: Jacques Navy MD,  August 16, 2009 5:56 PM    Additional Follow-up for Phone Call Additional follow up Details #2::    Dr Lin Givens office and patient informed. Will fax reports to MD - Phill Mutter [phone]......................Marland KitchenLamar Sprinkles, CMA  August 17, 2009 2:52 PM   Faxed results to Dr Lin Givens. Pt wants to know what to do next. Advised that Dr Lin Givens and Norins may have to discuss. Will she need to get IV antibiotics? .................Marland KitchenLamar Sprinkles, CMA  August 17, 2009 3:06 PM   Additional Follow-up for Phone Call Additional follow up Details #3:: Details for Additional Follow-up Action Taken: no indication for IV antibiotics.  left vm for pt...........Marland KitchenLamar Sprinkles, CMA  August 18, 2009 2:35 PM  Additional Follow-up by: Jacques Navy MD,  August 17, 2009 6:14 PM

## 2010-03-22 NOTE — Letter (Signed)
Summary: [facility]  [facility]   Imported By: Lester [place] 10/26/2009 10:24:46  _____________________________________________________________________  External Attachment:    Type:   Image     Comment:   External Document

## 2010-03-22 NOTE — Progress Notes (Signed)
  Phone Note Call from Patient Call back at Home Phone [phone]   Summary of Call: Patient saw Dr. Lin Givens today and he had inquired about the patient lumbar results,  upcoming procedures, if it would be inpatient, etc. Patient would like to know when she will going for those procedures b/c she is on medical leave until 08/30/09 and is perfectly ok with inpatient. Please call patient with results and status. Patient would also like to know if Dr. Debby Bud has spoken with any of her other MD's. Initial call taken by: Lucious Groves,  August 11, 2009 1:52 PM  Follow-up for Phone Call        Check on labs: CSF negative for oligoclonal bands ( a test of MS), glucose, proteint and cell count were normal. Test for syphyllis is still pending.  We can send lab results to Dr. Lin Givens.  I have not spoken with any of her other doctors.  Follow-up by: Jacques Navy MD,  August 11, 2009 2:34 PM  Additional Follow-up for Phone Call Additional follow up Details #1::        lmoam for pt to call back informed of results Additional Follow-up by: Ami Bullins CMA,  August 11, 2009 3:17 PM

## 2010-03-22 NOTE — Consult Note (Signed)
Summary: [facility] of MRI/[facility]  Review of MRI/[facility]   Imported By: Esmeralda Links D'jimraou 07/09/2007 13:46:55  _____________________________________________________________________  External Attachment:    Type:   Image     Comment:   External Document

## 2010-03-22 NOTE — Progress Notes (Signed)
Summary: APT THIS WEEK?  Phone Note Call from Patient   Summary of Call: See 03/20/08 phone note- Patient called stating that MD said that he would refer her to GYN to have some type of test that will check for cancer. LMOVM to call back, need details on why she wants referal Initial call taken by: Rock Nephew CMA,  April 14, 2008 8:28 AM  Follow-up for Phone Call        Called home phone-d/c, Called work# [id number] Follow-up by: Rock Nephew CMA,  April 15, 2008 8:34 AM  Additional Follow-up for Phone Call Additional follow up Details #1::        Patient is requesting apt for pain injection, next avail apt is not until march 11th and she does not want to wait till then. Ok apt? When?   2. Req apt for ultrasound at same time as annual pap. 3. Pt was denied for long term care insurance, Viviano Simas. Ins told pt that they provided reason why pt was denied to [place] and she wants that info. 4. Req generic alternative to birth control pill. Additional Follow-up by: Lamar Sprinkles,  April 15, 2008 8:43 AM    Additional Follow-up for Phone Call Additional follow up Details #2::    Reviewed chart: normal gyn exam and normal pap smear in January. I made no mention in notes for gyn referral. Why does she need a referral. 2) What type of U/S is she asking for? We do not do them in the office. Does she have a strong family history of ovarian cancer? 3) what kind of pain shot does she want ....where is the pain. We do joint and trigger point injections and she can be seen as a work in for pain that is appropriate for that. 4) I have no record of notification of denial by any insurance company for long term insurance. Follow-up by: Jacques Navy MD,  April 15, 2008 2:39 PM  Additional Follow-up for Phone Call Additional follow up Details #3:: Details for Additional Follow-up Action Taken: left mess to call office back ....................Marland KitchenLamar Sprinkles  April 15, 2008 2:50 PM   Pt was  advised to see GYN after last apt b/c mother died of cervical cancer. Wants to know who dr suggests. Also injection was to be for pain in thigh that she has had before. Ok wk-in apt for possible injection? ................Marland KitchenLamar Sprinkles  April 15, 2008 4:10 PM   Can work in for injection Thursday or Monday (I'm off Friday).  Pt scheduled for today ................Marland KitchenLamar Sprinkles  April 16, 2008 10:53 AM  Additional Follow-up by: Jacques Navy MD,  April 15, 2008 5:13 PM

## 2010-03-22 NOTE — Letter (Signed)
Summary: New Patient letter  [facility]  [address] [place], [place] [address]   Phone: [phone]  Fax: [phone]       06/29/2009 MRN: [id number]  Jordan [name] [address] [place], [place]  [address]  Dear Ms. Gendreau,  Welcome to the Gastroenterology Division at [facility].    You are scheduled to see Dr.  Melvia Heaps on Jul 06, 2009 at 9:00am on the 3rd floor at [facility], [address].  We ask that you try to arrive at our office 15 minutes prior to your appointment time to allow for check-in.  We would like you to complete the enclosed self-administered evaluation form prior to your visit and bring it with you on the day of your appointment.  We will review it with you.  Also, please bring a complete list of all your medications or, if you prefer, bring the medication bottles and we will list them.  Please bring your insurance card so that we may make a copy of it.  If your insurance requires a referral to see a specialist, please bring your referral form from your primary care physician.  Co-payments are due at the time of your visit and may be paid by cash, check or credit card.     Your office visit will consist of a consult with your physician (includes a physical exam), any laboratory testing he/she may order, scheduling of any necessary diagnostic testing (e.g. x-ray, ultrasound, CT-scan), and scheduling of a procedure (e.g. Endoscopy, Colonoscopy) if required.  Please allow enough time on your schedule to allow for any/all of these possibilities.    If you cannot keep your appointment, please call [phone] to cancel or reschedule prior to your appointment date.  This allows [place] the opportunity to schedule an appointment for another patient in need of care.  If you do not cancel or reschedule by 5 p.m. the business day prior to your appointment date, you will be charged a $50.00 late cancellation/no-show fee.    Thank you for  choosing [facility] for your medical needs.  We appreciate the opportunity to care for you.  Please visit [place] at our website  to learn more about our practice.                     Sincerely,                                                             The Gastroenterology Division

## 2010-03-22 NOTE — Progress Notes (Signed)
Summary: nerve pain  Phone Note Call from Patient   Caller: [phone] Summary of Call: Pt says that she has previously been treated for nerve pain. She says that it has returned and wants med called in. Initial call taken by: Lamar Sprinkles,  March 22, 2007 11:36 AM  Follow-up for Phone Call        Chart (electronic) reviewed: patient has been treated for cervical pain with NSAIDs in the past. If this is a recurrence of neck pain I would recommend meloxicam 15 mg daily, #30, 2 refills. Follow-up by: Jacques Navy MD,  March 22, 2007 4:09 PM  Additional Follow-up for Phone Call Additional follow up Details #1::        Pt informed  Additional Follow-up by: Lamar Sprinkles,  March 22, 2007 4:31 PM    New/Updated Medications: MELOXICAM 15 MG TABS (MELOXICAM) 1 once daily   Prescriptions: MELOXICAM 15 MG TABS (MELOXICAM) 1 once daily  #30 x 2   Entered by:   Lamar Sprinkles   Authorized by:   Jacques Navy MD   Signed by:   Lamar Sprinkles on 03/22/2007   Method used:   Electronically sent to ...       [id number]       [address]       [place], [place]  [address]       Ph: [phone] or [phone]       Fax: [phone]   RxID:   [id number]

## 2010-03-22 NOTE — Progress Notes (Signed)
Summary: Insurance Forms  Phone Note Call from Patient   Summary of Call: Pt has physical scheduled for January. She is filling out forms for long term disability. Wants to make sure that Dr Debby Bud is aware that she has not be dx with MS at this time. Insurance may contact dr or pt may drop off forms to be filled out.  Initial call taken by: Lamar Sprinkles,  February 03, 2008 1:01 PM  Follow-up for Phone Call        OK. Follow-up by: Jacques Navy MD,  February 03, 2008 1:18 PM

## 2010-03-22 NOTE — Letter (Signed)
Summary: [facility]  [facility]   Imported By: Lester [place] 06/04/2009 12:01:26  _____________________________________________________________________  External Attachment:    Type:   Image     Comment:   External Document

## 2010-03-22 NOTE — Progress Notes (Signed)
Summary: ABD PAIN / Diarrhea  Phone Note Call from Patient   Summary of Call: Pt called c/o abd pain & diarrhea. She is keeping a bland diet. Earliest apt with GI is october. Advised pt to call GI and speak with nurse to see if they can work her into an earlier apt. If this is not possible she will call our office back.  Initial call taken by: Lamar Sprinkles, CMA,  October 28, 2008 6:32 PM  Follow-up for Phone Call        agree - if GI cannot see her in a reasonable timeframe then I will see her Friday or Weds Follow-up by: Jacques Navy MD,  October 29, 2008 6:45 AM

## 2010-03-22 NOTE — Progress Notes (Signed)
Summary: PAIN  Phone Note Call from Patient   Summary of Call: Pt would like to know what is ok to take for pain. Advised Dr Debby Bud would return thursday. Pt will take advil as she usually does until response from Dr. Initial call taken by: Lamar Sprinkles,  Jul 09, 2007 9:26 AM  Follow-up for Phone Call        Neuro consult pending. For pain: if it is in her hand and arm she may need a NCS to better define possible carpal tunnel syndrome, best after her neuro consult. For pain it is OK to continue advil if it works. Follow-up by: Jacques Navy MD,  Jul 11, 2007 8:11 AM  Additional Follow-up for Phone Call Additional follow up Details #1::        Lf mess for pt Additional Follow-up by: Lamar Sprinkles,  Jul 11, 2007 2:26 PM

## 2010-03-22 NOTE — Letter (Signed)
Summary: [facility]  [facility]   Imported By: Lester [place] 08/03/2009 12:33:39  _____________________________________________________________________  External Attachment:    Type:   Image     Comment:   External Document

## 2010-03-22 NOTE — Progress Notes (Signed)
Summary: rx request  Phone Note Refill Request Message from:  Pharmacy on October 14, 2008 2:46 PM  Refills Requested: Medication #1:  PROMETHAZINE HCL 12.5 MG TABS 1 or 2 every 6 hours for nausea.   Last Refilled: 08/27/2008   Notes: rx written 08-27-2008 #20x0 Pharmacy requesting refill. Last Office Visit w/ Dr. Debby Bud: 08-27-2008. Okay for refills?  [id number] [phone]  Next Appointment Scheduled: 10-21-2008 w/ Vallie Teters Initial call taken by: Beola Cord, CMA,  October 14, 2008 2:47 PM  Follow-up for Phone Call        OK to refill x 2 Follow-up by: Jacques Navy MD,  October 14, 2008 2:58 PM  Additional Follow-up for Phone Call Additional follow up Details #1::        sent Additional Follow-up by: Orlan Leavens,  October 14, 2008 3:52 PM    Prescriptions: PROMETHAZINE HCL 12.5 MG TABS (PROMETHAZINE HCL) 1 or 2 every 6 hours for nausea  #20 x 2   Entered by:   Orlan Leavens   Authorized by:   Jacques Navy MD   Signed by:   Orlan Leavens on 10/14/2008   Method used:   Electronically to        [id number] ([facility]ail[facility]       [address]       [place], [place]  [address]       Ph: [phone] or [phone]       Fax: [phone]   RxID:   [id number]

## 2010-03-22 NOTE — Progress Notes (Signed)
Summary: FYI - Lumbar puncture today   Phone Note Call from Patient Call back at [facility] Phone [phone]   Summary of Call: FYI, patient's lumbar puncture did not go smoothly, had to have 2 different sticks. Just FYI, she is anxious for results.  Initial call taken by: Lamar Sprinkles, CMA,  August 03, 2009 5:52 PM  Follow-up for Phone Call        k Follow-up by: Jacques Navy MD,  August 04, 2009 11:11 AM

## 2010-03-22 NOTE — Assessment & Plan Note (Signed)
Summary: persistent abad pain...em   History of Present Illness Visit Type: consult Primary GI MD: Melvia Heaps MD [facility] Primary Provider: Norins Requesting Provider: Illene Regulus, MD Chief Complaint: Right side abdominal pain, renal ultrasound was negative History of Present Illness:   Jordan [name] is a 50 year old white female referred at the request of Dr. Debby Bud  for evaluation of nausea.  She had been complaining of right-sided abdominal pain with perhaps some radiation to her back.   abdominal pain was active for about a month.  This has spontaneously resolved.  She has been pain-free for at least one month.  Her main complaint is mild nausea that occurs with no regularity.  She denies change in bowel habits, pyrosis, dysphagia, melena or hematochezia.  She has taken PPIs in the past without much improvement.  CT of the abdomen approximately one month ago was normal except for a uterine fibroid.  Ms. Deisher also has a history of bulimia, anorexia and depression.  She was treated for multiple sclerosis but recently learn that she has neurosyphilis.   GI Review of Systems    Reports abdominal pain.     Location of  Abdominal pain: right side.    Denies acid reflux, belching, bloating, chest pain, dysphagia with liquids, dysphagia with solids, heartburn, loss of appetite, nausea, vomiting, vomiting blood, weight loss, and  weight gain.        Denies anal fissure, black tarry stools, change in bowel habit, constipation, diarrhea, diverticulosis, fecal incontinence, heme positive stool, hemorrhoids, irritable bowel syndrome, jaundice, light color stool, liver problems, rectal bleeding, and  rectal pain. Preventive Screening-Counseling & Management  Alcohol-Tobacco     Smoking Status: never      Drug Use:  no.      Current Medications (verified): 1)  Wellbutrin Xl 300 Mg Xr24h-Tab (Bupropion Hcl) .... Take 1 Tablet By Mouth Once A Day 2)  Lexapro 20 Mg Tabs (Escitalopram Oxalate) ....  Take 1/2 Tablet Once Daily 3)  Lunesta 3 Mg Tabs (Eszopiclone) .... Take 1 Tab By Mouth At Bedtime 4)  Multivitamins   Tabs (Multiple Vitamin) .... Take One Tablet Once Daily 5)  Lybrel 90-20 Mcg  Tabs (Levonorgestrel-Ethinyl Estrad) .... Take As Directed 6)  Alprazolam 0.5 Mg Tabs (Alprazolam) .Marland Kitchen.. 1 Tablet By Mouth Three Times A Day 7)  Gabapentin 300 Mg Caps (Gabapentin) .... Take 1 Tablet By Mouth Three Times A Day and 2 Tabs At Bedtime 8)  Savella 50 Mg Tabs (Milnacipran Hcl) .Marland Kitchen.. 1 Tablet By Mouth Two Times A Day 9)  Hydrocodone-Acetaminophen 5-325 Mg Tabs (Hydrocodone-Acetaminophen) .... Take 1 Tablet By Mouth Two Times A Day As Needed 10)  Baclofen 10 Mg Tabs (Baclofen) .... Take 1 Tab Q 8hrs As Needed For Muscle Spasms 11)  Provigil 200 Mg Tabs (Modafinil) .Marland Kitchen.. 1 Tablet Once Daily As Needed 12)  Nasonex 50 Mcg/act Susp (Mometasone Furoate) .Marland Kitchen.. 1 Spray Once Daily 13)  Clonidine Hcl 0.1 Mg/24hr Ptwk (Clonidine Hcl) .Marland Kitchen.. 1 Patch Weekly As Directed 14)  Promethazine Hcl 12.5 Mg Tabs (Promethazine Hcl) .Marland Kitchen.. 1 Q 6 As Needed Nausea  Allergies (verified): No Known Drug Allergies  Past History:  Past Medical History: HEADACHE (ICD-784.0) DEPRESSION (ICD-311) ANEMIA-NOS (ICD-285.9) Hx of BULIMIA, HX OF (ICD-V11.8) Hx of ANOREXIA, HX OF (ICD-V15.89) MENINGITIS NOS (ICD-322.9) GLAUCOMA NEC (ICD-365.89) HAY FEVER (ICD-477.0) UPPER RESPIRATORY INFECTION (URI) (ICD-465.9) FIBROCYSTIC BREAST DISEASE (ICD-610.1) LUMP OR MASS IN BREAST (ICD-611.72) TYMPANIC MEMBRANE PERFORATION (ICD-384.20) MENINGITIS NOS (ICD-322.9) GLAUCOMA NEC (ICD-365.89) Anal Fissure Anxiety Disorder  Fibromyalgia Irritable Bowel Syndrome Seizures  Past Surgical History: Reviewed history from 02/18/2007 and no changes required. 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 No children No h/o abuse. Patient has never smoked.  Alcohol Use - yes 2-4/day Daily Caffeine Use 2/day Illicit Drug Use - no Drug Use:  no  Review of Systems       The patient complains of back pain, change in vision, confusion, fatigue, itching, menstrual pain, muscle pains/cramps, night sweats, nosebleeds, urination - excessive, urination changes/pain, and urine leakage.  The patient denies allergy/sinus, anemia, anxiety-new, arthritis/joint pain, blood in urine, breast changes/lumps, cough, coughing up blood, depression-new, fainting, fever, headaches-new, hearing problems, heart murmur, heart rhythm changes, pregnancy symptoms, shortness of breath, skin rash, sleeping problems, sore throat, swelling of feet/legs, swollen lymph glands, thirst - excessive, and voice change.    Vital Signs:  Patient profile:   50 year old female Height:      62 inches Weight:      117 pounds BMI:     21.48 Pulse rate:   80 / minute Pulse rhythm:   regular BP sitting:   110 / 80  (left arm) Cuff size:   regular  Vitals Entered By: Francee Piccolo CMA Duncan Dull) (August 02, 2009 3:52 PM)  Physical Exam  Additional Exam:  She is a well-developed nourished female  skin: anicteric HEENT: normocephalic; PEERLA; no nasal or pharyngeal abnormalities neck: supple nodes: no cervical lymphadenopathy chest: clear to ausculatation and percussion heart: no murmurs, gallops, or rubs abd: soft, nontender; BS normoactive; no abdominal masses,  organomegaly; there is mild tenderness to palpation in the midepigastrium and right periumbilical areas rectal: deferred ext: no cynanosis, clubbing, edema skeletal: no deformities neuro: oriented x 3; no focal abnormalities    Impression & Recommendations:  Problem # 1:  ABDOMINAL PAIN RIGHT LOWER QUADRANT  (ICD-789.03) Assessment Improved This does not appear to be an active problem.  There is no evidence for intra-abdominal pathology by CT scan.  Recommendations #1 screening colonoscopy  Problem # 2:  NAUSEA ALONE (ICD-787.02)  Symptoms are probably due to nonulcer dyspepsia.  Active ulcer disease, gastroparesis and medication effect or other considerations.  Recommendations #1 upper endoscopy-to be done at the same time as colonoscopy #2 continue current medications for now  Orders: [facility] (Col/End [place])  Problem # 3:  GLAUCOMA NEC (ICD-365.89) Assessment: Comment Only  Problem # 4:  DEPRESSION (ICD-311) Assessment: Comment Only  Patient Instructions: 1)  Copy sent to : Jordan Norins,MD 2)  Colon/Endo with [facility]  [facility] 10/28/09 12:30 pm. arrive at 10:30 pm. 3)  Movi prep instructions will be given to patient 08/03/09. 4)  Movi prep Rx. sent to patient's pharmacy. 5)  Book # [phone]  attn:  Clydie Braun. 6)  The medication list was reviewed and reconciled.  All changed / newly prescribed medications were explained.  A complete medication list was provided to the patient / caregiver. Prescriptions: MOVIPREP 100 GM  SOLR (PEG-KCL-NACL-NASULF-NA ASC-C) As per prep instructions.  #1 x 0   Entered by:   Milford Cage NCMA   Authorized by:   Louis Meckel MD   Signed by:   Milford Cage NCMA on 08/03/2009   Method used:   [facility] [id number]       [address]       [place], [place]  [address]       Ph: [phone]       Fax: [phone]   RxID:   [id number]

## 2010-03-22 NOTE — Progress Notes (Signed)
Summary: results/pain  Phone Note Call from Patient   Summary of Call: Patient is requesting results of CT. Continues to have pain.  Initial call taken by: Lamar Sprinkles, CMA,  Jun 25, 2009 1:18 PM  Follow-up for Phone Call        CT reviewed: normal except for a small uterine fibroid.  Thanks Follow-up by: Jacques Navy MD,  Jun 27, 2009 9:21 PM  Additional Follow-up for Phone Call Additional follow up Details #1::        Patient left message on triage c/o a great deal of pain. Any recommendations? Additional Follow-up by: Lucious Groves,  Jun 28, 2009 1:16 PM    Additional Follow-up for Phone Call Additional follow up Details #2::    will refer to GI. [facility] notified. Follow-up by: Jacques Navy MD,  Jun 28, 2009 6:01 PM

## 2010-03-22 NOTE — Consult Note (Signed)
Summary: [facility]   Imported By: Lennie Odor 07/06/2009 17:21:39  _____________________________________________________________________  External Attachment:    Type:   Image     Comment:   External Document

## 2010-03-22 NOTE — Progress Notes (Signed)
Summary: Req for Valtrex  Phone Note Call from Patient   Caller: [phone] Summary of Call: Pt left mess: has a history of genital herpes and now has lesions that have come up. She is requesting valtrex to be called in. Left pt mess to get more info. Initial call taken by: Lamar Sprinkles,  February 22, 2007 11:55 AM  Follow-up for Phone Call        Valtrex 500mg  q 12 hours x 3 days. #6  refill x 2 Follow-up by: Jacques Navy MD,  February 22, 2007 12:31 PM  Additional Follow-up for Phone Call Additional follow up Details #1::        Patient informed  Additional Follow-up by: Lamar Sprinkles,  February 22, 2007 12:59 PM    New/Updated Medications: VALTREX 500 MG TABS (VALACYCLOVIR HCL) 1 q 12 hrs x 3 days   Prescriptions: VALTREX 500 MG TABS (VALACYCLOVIR HCL) 1 q 12 hrs x 3 days  #6 x 2   Entered by:   Lamar Sprinkles   Authorized by:   Jacques Navy MD   Signed by:   Lamar Sprinkles on 02/22/2007   Method used:   [facility] ...       [id number]       [address]       [place], [place]  [address]       Ph: [phone] or [phone]       Fax: [phone]   RxID:   [id number]

## 2010-03-22 NOTE — Assessment & Plan Note (Signed)
Summary: ok wk/in per Dr SD   Vital Signs:  Patient Profile:   50 Years Old Female Height:     63 inches Weight:      121 pounds Temp:     98.5 degrees F oral Pulse rate:   72 / minute BP sitting:   110 / 60  (left arm) Cuff size:   regular  Vitals Entered By: Zackery Barefoot CMA (April 16, 2008 4:30 PM)                 PCP:  Clerance Umland  Chief Complaint:  Right arm pain.  History of Present Illness: Patient having pain in the right trapezius area. She had this before and responded nicely to a trigger point injection.     Prior Medications Reviewed Using: Patient Recall  Updated Prior Medication List: WELLBUTRIN XL 300 MG XR24H-TAB (BUPROPION HCL) Take 1 tablet by mouth once a day LEXAPRO 20 MG TABS (ESCITALOPRAM OXALATE) Take 1 tablet by mouth once a day LUNESTA 2 MG  TABS (ESZOPICLONE) Take one tablet once daily MULTIVITAMINS   TABS (MULTIPLE VITAMIN) Take one tablet once daily LYBREL 90-20 MCG  TABS (LEVONORGESTREL-ETHINYL ESTRAD) Take as directed XANAX 0.25 MG  TABS (ALPRAZOLAM) as needed VALTREX 500 MG TABS (VALACYCLOVIR HCL) 1 q 12 hrs x 3 days GABAPENTIN 300 MG CAPS (GABAPENTIN) Take 1 tablet by mouth three times a day and 2 tabs at bedtime  Current Allergies (reviewed today): ! SULFA ! DARVON  Past Medical History:    Reviewed history from 02/18/2007 and no changes required:       HEADACHE (ICD-784.0)       DEPRESSION (ICD-311)       ANEMIA-NOS (ICD-285.9)       Hx of BULIMIA, HX OF (ICD-V11.8)       Hx of ANOREXIA, HX OF (ICD-V15.89)       MENINGITIS NOS (ICD-322.9)       GLAUCOMA NEC (ICD-365.89)       HAY FEVER (ICD-477.0)       UPPER RESPIRATORY INFECTION (URI) (ICD-465.9)       FIBROCYSTIC BREAST DISEASE (ICD-610.1)       LUMP OR MASS IN BREAST (ICD-611.72)       TYMPANIC MEMBRANE PERFORATION (ICD-384.20)       MENINGITIS NOS (ICD-322.9)       GLAUCOMA NEC (ICD-365.89)  Past Surgical History:    Reviewed history from 02/18/2007 and no changes  required:       DILATION AND CURETTAGE, HX OF FOR MENOMETRORRHAGIA (ICD-V45.89)       DE QUERVAIN'S TENOSYNOVITIS (ICD-727.04)       INJURY, PERONEAL NERVE (ICD-956.3)       ADENOIDECTOMY, HX OF (ICD-V45.79)       TYMPANIC MEMBRANE PERFORATION (ICD-384.20)    Risk Factors: Tobacco use:  never Alcohol use:  yes    Type:  wine    Drinks per day:  1 Exercise:  yes    Times per week:  3    Type:  yoga Seatbelt use:  100 %  Mammogram History:    Date of Last Mammogram:  12/11/2006  PAP Smear History:    Date of Last PAP Smear:  02/28/2008   Review of Systems  The patient denies anorexia, fever, weight loss, chest pain, dyspnea on exertion, headaches, abdominal pain, muscle weakness, and abnormal bleeding.     Physical Exam  General:     Well-developed,well-nourished,in no acute distress; alert,appropriate and cooperative throughout examination Msk:  full ROM neck, UE. Neurologic:     Normal strength and sensation in the cervical and proximal UE Skin:     no rash or lesion in the posterior cervical region. Cervical Nodes:     no posterior cervical adenopathy.      Impression & Recommendations:  Problem # 1:  NECK PAIN, CHRONIC (ICD-723.1) Patient with recurrent exacerbation of neck pain.  Plan: trigger point injection - tolerated well with good results. Orders: Trigger Point Injection (1 or 2 muscles) (78295) Depo- Medrol 40mg  (J1030)   Complete Medication List: 1)  Wellbutrin Xl 300 Mg Xr24h-tab (Bupropion hcl) .... Take 1 tablet by mouth once a day 2)  Lexapro 20 Mg Tabs (Escitalopram oxalate) .... Take 1 tablet by mouth once a day 3)  Lunesta 2 Mg Tabs (Eszopiclone) .... Take one tablet once daily 4)  Multivitamins Tabs (Multiple vitamin) .... Take one tablet once daily 5)  Lybrel 90-20 Mcg Tabs (Levonorgestrel-ethinyl estrad) .... Take as directed 6)  Xanax 0.25 Mg Tabs (Alprazolam) .... As needed 7)  Valtrex 500 Mg Tabs (Valacyclovir hcl) .Marland Kitchen.. 1 q 12  hrs x 3 days 8)  Gabapentin 300 Mg Caps (Gabapentin) .... Take 1 tablet by mouth three times a day and 2 tabs at bedtime      Procedure Note  Injections: The patient complains of pain. Indication: acute pain  Procedure # 1: trigger point injection    Location: posterior cervical - right of midline, below hair line    Technique: 23 g needle    Medication: 40 mg depomedrol    Anesthesia: 1% lidocain w/ epinephrine    Comment: verbal consent obtained. Tolerated well  Cleaned and prepped with: betadine

## 2010-03-22 NOTE — Letter (Signed)
   [facility] [address] [place], [place]  [address] Phone: [phone]      March 08, 2008   Jordan [name] [address] [place], [place] [address]  RE:  LAB RESULTS  Dear  [name],  The following is an interpretation of your most recent lab tests.  Please take note of any instructions provided or changes to medications that have resulted from your lab work.  Pap Smear: normal  ELECTROLYTES:  Good - no changes needed  KIDNEY FUNCTION TESTS:  Good - no changes needed  LIVER FUNCTION TESTS:  Good - no changes needed  Health professionals look at cholesterol as more involved than just the total cholesterol. We consider the level of LDL (bad) cholesterol, HDL (good), cholesterol, and Triglycerides (Grease) in the blood.  1. Your LDL should be under 100, and the HDL should be over 45, if you have any vascular disease such as heart attack, angina, stroke, TIA (mini stroke), claudication (pain in the legs when you walk due to poor circulation),  Abdominal Aortic Aneurysm (AAA), diabetes or prediabetes.  2. Your LDL should be under 130 if you have any two of the following:     a. Smoke or chew tobacco,     b. High blood pressure (if you are on medication or over 140/90 without medication),     c. Female gender,    d. HDL below 40,    e. A female relative (father, brother, or son), who have had any vascular event          as described in #1. above under the age of 6, or a female relative (mother,       sister, or daughter) who had an event as described above under age 50. (An HDL over 60 will subtract one risk factor from the total, so if you have two items in # 2 above, but an HDL over 60, you then fall into category # 3 below).  3. Your LDL should be under 160 if you have any one of the above.  Triglycerides should be under 200 with the ideal being under 150.  For diabetes or pre-diabetes, the ideal HgbA1C should be under 6.0%.  If you fall into any of the  above categories, you should make a follow up appointment to discuss this with your physician.  LIPID PANEL:  Good - no changes needed Triglyceride: 60   Cholesterol: 133   LDL: 67   HDL: 54.5   Chol/HDL%:  2.4 CALC  THYROID STUDIES:  Thyroid studies normal TSH: 2.07     DIABETIC STUDIES:  Good - no changes needed Blood Glucose: 92    CBC:  Good - no changes needed   Normal lab results.  Call or e-mail me if you have questions ([email or web address]).   Sincerely Yours,    Jacques Navy MD

## 2010-03-22 NOTE — Progress Notes (Signed)
Summary: call request  Phone Note Call from Patient Call back at Home Phone [phone]   Summary of Call: Patient left message on triage that she is not sure what is going on and her doctors do not seem to agree. Patient would like for MD to call her to discuss or should patient schedule office visit? Please advise. Initial call taken by: Lucious Groves,  August 19, 2009 8:57 AM  Follow-up for Phone Call        Not sure about where there is disagreement. Dr. Lin Givens found a positive FTA from the blood - this can be old exposure and if never treated may need treatment. The CSF was negative - little chance of neurosyphlis that requires IV antibiotics. If he has treatment recommendations, i.e. IV antibiotics she can come see me for an office visit or we can refer to ID for consultation. Follow-up by: Jacques Navy MD,  August 19, 2009 9:28 AM  Additional Follow-up for Phone Call Additional follow up Details #1::        Spoke w/Jenny, Dr Lin Givens nurse. She knows MD was aware of results but did not note any specific reccomendations. She will speak w/Dr Lin Givens in the am and he will call Dr Debby Bud to discuss Additional Follow-up by: Lamar Sprinkles, CMA,  August 19, 2009 4:29 PM

## 2010-03-22 NOTE — Letter (Signed)
[facility]   Imported By: Sherian Rein 05/21/2009 14:39:54  _____________________________________________________________________  External Attachment:    Type:   Image     Comment:   External Document

## 2010-03-22 NOTE — Consult Note (Signed)
Summary: Daiva Huge MD  Daiva Huge MD   Imported By: Lester [place] 05/28/2009 10:51:41  _____________________________________________________________________  External Attachment:    Type:   Image     Comment:   External Document

## 2010-03-22 NOTE — Progress Notes (Signed)
Summary: RESULTS  Phone Note Call from Patient   Summary of Call: Pt continues to have discomfort. She req to have results of 24hr u/a. Checked w/lab, testing may result as early as tomorrow but most likely next week. Advised her to call office w/any changes.  Initial call taken by: Lamar Sprinkles, CMA,  June 10, 2009 1:46 PM  Follow-up for Phone Call        k Follow-up by: Jacques Navy MD,  June 10, 2009 2:10 PM

## 2010-03-22 NOTE — Progress Notes (Signed)
Summary: APT w/Plot  Phone Note Call from Patient Call back at Home Phone [phone]   Caller: [phone] Call For: Jacques Navy MD Summary of Call: per Franchot Erichsen call want to know if dr Debby Bud will approve for her to get another pain shot . Initial call taken by: Shelbie Proctor,  Jun 30, 2008 11:16 AM  Follow-up for Phone Call        Should be ok for a trigger point injection, last in feb. Ask Dr. Posey Rea if he is willing. Follow-up by: Jacques Navy MD,  Jul 01, 2008 4:03 PM  Additional Follow-up for Phone Call Additional follow up Details #1::        OK Additional Follow-up by: Tresa Garter MD,  Jul 03, 2008 1:27 PM    Additional Follow-up for Phone Call Additional follow up Details #2::    lf mess for pt to call back and schedule apt w/plot for injection .....................Marland KitchenLamar Sprinkles  Jul 03, 2008 1:44 PM   Additional Follow-up for Phone Call Additional follow up Details #3:: Details for Additional Follow-up Action Taken: No return call Additional Follow-up by: Lamar Sprinkles,  Jul 16, 2008 1:21 PM

## 2010-03-22 NOTE — Assessment & Plan Note (Signed)
   Vital Signs:  Patient Profile:   50 Years Old Female Weight:      111 pounds Pulse rate:   87 / minute BP sitting:   104 / 67                 Acute Visit History:      Other comments include: Breast lump 2 days ago in R breast.  Current Allergies: No known allergies    Family History:    Reviewed history and no changes required:  Social History:    Never Smoked   Risk Factors:  Tobacco use:  never    Review of Systems       no periods on curren BCP   Physical Exam  General:     Well-developed,well-nourished,in no acute distress; alert,appropriate and cooperative throughout examination Head:     Normocephalic and atraumatic without obvious abnormalities. No apparent alopecia or balding. Neck:     No deformities, masses, or tenderness noted. Breasts:     no nipple discharge, no adenopathy, R breast mass, and R breast tender.   Msk:     No deformity or scoliosis noted of thoracic or lumbar spine.   Skin:     Intact without suspicious lesions or rashes    Impression & Recommendations:  Problem # 1:  LUMP OR MASS IN BREAST (ICD-611.72) Assessment: New Due to #2 Mammogr/US R breast. Doubt CA.  Problem # 2:  FIBROCYSTIC BREAST DISEASE (ICD-610.1) Assessment: Unchanged Vit E May need to change BCP.  Complete Medication List: 1)  Lamictal 200 Mg Tabs (Lamotrigine) .... Once daily 2)  Wellbutrin Xl 300 Mg Tb24 (Bupropion hcl) .... 350mg  once daily 3)  Lexapro 20 Mg Tabs (Escitalopram oxalate) .... Once daily 4)  Lunesta 2 Mg Tabs (Eszopiclone) .... Once daily 5)  Multivitamins Tabs (Multiple vitamin) .... Once daily 6)  Lybrel 90-20 Mcg Tabs (Levonorgestrel-ethinyl estrad) .... As directed 7)  Zantac 150 Mg Tabs (Ranitidine hcl) 8)  Xanax 0.25 Mg Tabs (Alprazolam) .... As needed   Patient Instructions: 1)  Please schedule an appointment with your primary doctor to go over results.    ]

## 2010-03-22 NOTE — Progress Notes (Signed)
Summary: GYN referral  Phone Note Call from Patient   Caller: Patient Call For: Jacques Navy MD Summary of Call: Pt states she has been waiting for a referral to GYN.  Please order. Initial call taken by: Payton Spark CMA,  March 20, 2008 11:42 AM  Follow-up for Phone Call        chart reviewed: Pt with a normal PAP Jan '10. She was sent a letterstating normal PAP. Why is she wanting referral to Gyn? Follow-up by: Jacques Navy MD,  March 24, 2008 6:30 AM  Additional Follow-up for Phone Call Additional follow up Details #1::        called pt home number and it is not a working number...Marland Kitchen. called pt work number listed and there was no answer Additional Follow-up by: Windell Norfolk,  March 24, 2008 8:38 AM    Additional Follow-up for Phone Call Additional follow up Details #2::    tried again still not a working number closing phone note until hear further from pt Follow-up by: Windell Norfolk,  March 25, 2008 9:24 AM

## 2010-03-22 NOTE — Progress Notes (Signed)
Summary: CALL A NURSE REPORT  Phone Note Other Incoming   Summary of Call: [facility] Triage Call Report Di Kindle Operator: [id number] Triage Record Num: Call Date & Time: Jordan [name] Patient Name: 05/09/2009 5:12:36PM Jordan [name] PCP: Patient Phone: PCP Fax : [phone] Patient Gender: Female 05/04/1960 Patient DOB: Practice Name: [facility] Reason for Call: BP 136/84, HR 93, Pt calling with chest pain, onset  ~ 2 weeks ago, now has pain in chest, hard time catching breath, fatigue, flushed, wonders if anxiety. Guideline: chest pain, advised to call 911. Chest Pain / Discomfort Protocol(s) Used: Activate EMS 911 Recommended Outcome per Protocol: Reason for Outcome: Pain with shortness of breath now or within last hour that occurs with rest and NOT relieved after being in sitting or standing position Care Advice: Write down provider's name. List or place the following in a bag for transport with the patient: current prescription and/or OTC medications; alternative treatments, therapies and medications; and street drugs.  ~ An adult should stay with the patient, preferably one trained in CPR.  ~ Page 1 of 1 05/09/2009 5:21:50PM [id number] Initial call taken by: Lamar Sprinkles, CMA,  May 10, 2009 11:42 AM

## 2010-03-22 NOTE — Letter (Signed)
[facility]   Imported By: Esmeralda Links D'jimraou 01/07/2007 14:00:48  _____________________________________________________________________  External Attachment:    Type:   Image     Comment:   External Document

## 2010-03-22 NOTE — Assessment & Plan Note (Signed)
Summary: abd pain/ SD   Vital Signs:  Patient profile:   50 year old female Height:      63 inches Weight:      115.50 pounds BMI:     20.53 Temp:     98.8 degrees F oral Pulse rate:   71 / minute BP supine:   118 / 82 BP sitting:   116 / 80  (left arm) BP standing:   102 / 76  Vitals Entered By: Lamar Sprinkles (August 27, 2008 5:11 PM)  Primary Care Provider:  Amelio Brosky   History of Present Illness: Patient report 4 days of epigastric pain at an 8/10. For two days she has had dark tarry stools and very odorous. she is also very nauseated. She feels very light headed and weak. She cannot eat due to nausea.   Current Medications (verified): 1)  Wellbutrin Xl 300 Mg Xr24h-Tab (Bupropion Hcl) .... Take 1 Tablet By Mouth Once A Day 2)  Lexapro 20 Mg Tabs (Escitalopram Oxalate) .... Take 1 Tablet By Mouth Once A Day 3)  Lunesta 2 Mg  Tabs (Eszopiclone) .... Take One Tablet Once Daily 4)  Multivitamins   Tabs (Multiple Vitamin) .... Take One Tablet Once Daily 5)  Lybrel 90-20 Mcg  Tabs (Levonorgestrel-Ethinyl Estrad) .... Take As Directed 6)  Xanax 0.25 Mg  Tabs (Alprazolam) .... As Needed 7)  Valtrex 500 Mg Tabs (Valacyclovir Hcl) .Marland Kitchen.. 1 Q 12 Hrs X 3 Days 8)  Gabapentin 300 Mg Caps (Gabapentin) .... Take 1 Tablet By Mouth Three Times A Day and 2 Tabs At Bedtime  Allergies: 1)  ! Sulfa 2)  ! Darvon PMH-FH-SH reviewed for relevance  Review of Systems       The patient complains of anorexia, weight loss, abdominal pain, and severe indigestion/heartburn.  The patient denies fever, vision loss, hoarseness, chest pain, syncope, dyspnea on exertion, peripheral edema, and prolonged cough.    Physical Exam  General:  alert, well-developed, well-nourished, and well-hydrated.   Head:  normocephalic and atraumatic.   Lungs:  normal respiratory effort and normal breath sounds.   Heart:  normal rate and regular rhythm.   Abdomen:  BS+ x 4, very tender at the epigastrum, no guarding or rebound,  no masses, no organomegaly. Rectal:  NST, stool black but heme negative.   Impression & Recommendations:  Problem # 1:  ABDOMINAL PAIN, EPIGASTRIC (ICD-789.06) Patient with pain and nausea. She has had black stools but I failed to ask if she is taking pepto-bismal. Stool was heme negative. Hgb 15.3 g  Plan: Nexium 40mg  qAm #15 provided then Rx for pantoprazole 30mg           promethazine 12.5 mg 1 opr 2 q 6 as needed         BRAT diet.   Complete Medication List: 1)  Wellbutrin Xl 300 Mg Xr24h-tab (Bupropion hcl) .... Take 1 tablet by mouth once a day 2)  Lexapro 20 Mg Tabs (Escitalopram oxalate) .... Take 1 tablet by mouth once a day 3)  Lunesta 2 Mg Tabs (Eszopiclone) .... Take one tablet once daily 4)  Multivitamins Tabs (Multiple vitamin) .... Take one tablet once daily 5)  Lybrel 90-20 Mcg Tabs (Levonorgestrel-ethinyl estrad) .... Take as directed 6)  Xanax 0.25 Mg Tabs (Alprazolam) .... As needed 7)  Valtrex 500 Mg Tabs (Valacyclovir hcl) .Marland Kitchen.. 1 q 12 hrs x 3 days 8)  Gabapentin 300 Mg Caps (Gabapentin) .... Take 1 tablet by mouth three times a day and  2 tabs at bedtime 9)  Lansoprazole 30 Mg Cpdr (Lansoprazole) .Marland Kitchen.. 1 by mouth once daily 10)  Promethazine Hcl 12.5 Mg Tabs (Promethazine hcl) .Marland Kitchen.. 1 or 2 every 6 hours for nausea Prescriptions: PROMETHAZINE HCL 12.5 MG TABS (PROMETHAZINE HCL) 1 or 2 every 6 hours for nausea  #20 x 0   Entered and Authorized by:   Jacques Navy MD   Signed by:   Jacques Navy MD on 08/27/2008   Method used:   Electronically to        [id number] ([facility]       [address]       [place], [place]  [address]       Ph: [phone] or [phone]       Fax: [phone]   RxID:   [id number] LANSOPRAZOLE 30 MG CPDR (LANSOPRAZOLE) 1 by mouth once daily  #30 x 3   Entered and Authorized by:   Jacques Navy MD   Signed by:   Jacques Navy MD on 08/27/2008   Method used:   Print then Give to Patient   RxID:   [id number]

## 2010-03-22 NOTE — Progress Notes (Signed)
Summary: Call Report  Phone Note Other Incoming   Caller: [facility] Call Report Summary of Call: [facility] Triage Call Report Triage Record Num: [id number] Operator: Dayton Martes Patient Name: Jordan [name] Call Date & Time: 08/07/2009 9:58:33AM Patient Phone: [phone] PCP: Illene Regulus Patient Gender: Female PCP Fax : [phone] Patient DOB: 1960-10-24 Practice Name: [facility] Reason for Call: LMP  ~ 1 month ago; BCP Pt sts that she has a headache. Had a spinal tap 08/03/09 and a blood patch 08/05/09. Pt sts that the hospital just called her back and told her that the spot they had to patch was rather large and she needs to continue lying down for another 24hrs. Pt sts that she told them she was still having a headache and she was told to continue lying down. Rates pain 2/10 with lying down and 10/10 with standing. Pt sts that her neck is stiff and has been since the lumbar puncture on Tuesday (08/03/09). Denies 911 sx's. Care adv given. 1015-Notified Dr. Tawanna Cooler of the above. T.O. May take Motrin 600mg  PO TID with food, cloth covered ice pack to head. If develops neurologic sx's will need to go to ER. RN adv pt per T.O. Protocol(s) Used: Headache Recommended Outcome per Protocol: See ED Immediately Reason for Outcome: New onset of stiff neck, severe generalized headache, vomiting Care Advice: Call EMS 911 if new or worsening drowsiness/difficulty awakening, photophobia (sensitive to light), confusion, or seizure  ~  ~ IMMEDIATE ACTION 06/ Initial call taken by: Margaret Pyle, CMA,  August 09, 2009 10:08 AM

## 2010-03-22 NOTE — Progress Notes (Signed)
Summary: directions prior to spinal  Phone Note Call from Patient Call back at Home Phone [phone]   Summary of Call: Patient left message on triage asking for directions prior to spinal tap. Is it NPO after midnight? Can she take her meds prior? Please advise. Initial call taken by: Lucious Groves,  August 02, 2009 2:15 PM  Follow-up for Phone Call        no prep, no fasting, can take all routine medications Follow-up by: Jacques Navy MD,  August 02, 2009 6:49 PM  Additional Follow-up for Phone Call Additional follow up Details #1::        informed pt Additional Follow-up by: Ami Bullins CMA,  August 03, 2009 8:23 AM

## 2010-03-22 NOTE — Progress Notes (Signed)
Summary: Head/Neck pain after LP  Phone Note Call from Patient   Summary of Call: Pt had lumbar puncture today. C/o head and neck pain, 6 out of 10. She has hydrocodone for pain from neurologist. This has eased pain slightly.  Initial call taken by: Lamar Sprinkles, CMA,  August 04, 2009 2:29 PM  Follow-up for Phone Call        post LP headache not uncommon. Best treatment is to lay down as much as possible. If pain continues she needs to contact radiology for possible blood patch.  Follow-up by: Jacques Navy MD,  August 04, 2009 2:57 PM  Additional Follow-up for Phone Call Additional follow up Details #1::        Left vm for pt Additional Follow-up by: Lamar Sprinkles, CMA,  August 04, 2009 5:18 PM

## 2010-03-22 NOTE — Progress Notes (Signed)
Summary: Broken Toe  Phone Note Call from Patient Call back at Home Phone [phone]   Summary of Call: Pt hit her toe this am, thinks it is broken. Is just buddy tape ok?  Initial call taken by: Lamar Sprinkles,  November 21, 2007 2:36 PM  Follow-up for Phone Call        If the toe is not deformed or clear out of place the it is ok to buddy tape it. If displaced - x-ray. Follow-up by: Jacques Navy MD,  November 21, 2007 6:14 PM  Additional Follow-up for Phone Call Additional follow up Details #1::        LF mess for pt Additional Follow-up by: Lamar Sprinkles,  November 22, 2007 3:07 PM

## 2010-03-22 NOTE — Progress Notes (Signed)
Summary: RESULTS  Phone Note Call from Patient Call back at Home Phone [phone]   Summary of Call: Patient is requesting results of last labs.  Initial call taken by: Lamar Sprinkles, CMA,  September 06, 2009 3:12 PM  Follow-up for Phone Call        RPR and T. Pallidum serum assays were negative!! provide her copy of the labs to take to her doctor or we can fax them to him. Follow-up by: Jacques Navy MD,  September 06, 2009 5:40 PM  Additional Follow-up for Phone Call Additional follow up Details #1::        Pt informed, needs to be faxed to Trudie Buckler and Dr Sandria Manly.  Additional Follow-up by: Lamar Sprinkles, CMA,  September 06, 2009 6:21 PM    Additional Follow-up for Phone Call Additional follow up Details #2::    I faxed labs over to Dr Imagene Gurney office. Could not find a fax or contact for Dr Trudie Buckler. Follow-up by: Ami Bullins CMA,  September 07, 2009 8:54 AM  Additional Follow-up for Phone Call Additional follow up Details #3:: Details for Additional Follow-up Action Taken: [facility] faxed to [facility] in [place] - Attn Dr Tinnie Gens Additional Follow-up by: Lamar Sprinkles, CMA,  September 07, 2009 1:23 PM

## 2010-03-22 NOTE — Progress Notes (Signed)
Summary: FYI  Phone Note Call from Patient Call back at [facility] Phone [phone]   Caller: Patient Summary of Call: Patient called [facility] stating that she went to [facility] and saw Dr Sandria Manly. She wanted to thank Dr Debby Bud for encouraging her to see him. She would like to make sure that Dr. Alena Bills  receives her office notes/records from here. Patient also mentioned that  she received a call from other Md who advised her that her third set of labs were negative. Initial call taken by: Rock Nephew CMA,  September 23, 2009 1:09 PM  Follow-up for Phone Call        noted Follow-up by: Jacques Navy MD,  September 24, 2009 8:12 AM

## 2010-03-22 NOTE — Letter (Signed)
Summary: [facility]  [facility]   Imported By: Sherian Rein 03/08/2009 13:35:15  _____________________________________________________________________  External Attachment:    Type:   Image     Comment:   External Document

## 2010-03-22 NOTE — Progress Notes (Signed)
Summary: Back pain   Phone Note Call from Patient   Summary of Call: Pt forgot to mention at last office visit: c/o pain in her back on lower right side that has been occuring over the last 2 wks. Would this have anything to do with the possible adrenal issues?  Initial call taken by: Lamar Sprinkles, CMA,  June 03, 2009 10:23 AM  Follow-up for Phone Call        not related to the adrenal issues I am concerned about. Follow-up by: Jacques Navy MD,  June 03, 2009 1:09 PM  Additional Follow-up for Phone Call Additional follow up Details #1::        informed pt. Pt states she has constant back pain. Should she be seen in office for evaluation of this  Additional Follow-up by: Ami Bullins CMA,  June 03, 2009 4:38 PM    Additional Follow-up for Phone Call Additional follow up Details #2::    happy to see her in the office for this problem Follow-up by: Jacques Navy MD,  June 04, 2009 4:08 AM  Additional Follow-up for Phone Call Additional follow up Details #3:: Details for Additional Follow-up Action Taken: pt will what until she sees her MS doctor next week and call if she needs an appt Additional Follow-up by: Ami Bullins CMA,  June 04, 2009 9:25 AM

## 2010-03-22 NOTE — Consult Note (Signed)
Summary: [facility]  [facility]   Imported By: Esmeralda Links D'jimraou 08/05/2007 12:05:42  _____________________________________________________________________  External Attachment:    Type:   Image     Comment:   External Document

## 2010-03-22 NOTE — Assessment & Plan Note (Signed)
Summary: CPX $50 / CD   Vital Signs:  Patient Profile:   50 Years Old Female Height:     63 inches Weight:      120.5 pounds BMI:     21.42 Temp:     97.3 degrees F oral Pulse rate:   80 / minute BP sitting:   108 / 70  (left arm) Cuff size:   regular  Vitals Entered By: Zackery Barefoot CMA (February 28, 2008 10:07 AM)                 PCP:  Enoch Moffa  Chief Complaint:  CPX with PAP.  History of Present Illness: Ms. Drohan is a 50 yo cooperative to exam who complains of the following:  1.  3 day hx of sore ears     - worse at night, 10/10 on pain scale     - not very bothersome during the day.     - no loss of hearing, no associated URI symptoms     - has taken no medication for ears  2.  weight gain 7 lbs since late November     -complains of weight gain in breasts, uneven weight gain in right breast.    Prior Medications Reviewed Using: Patient Recall  Updated Prior Medication List: WELLBUTRIN XL 150 MG  TB24 (BUPROPION HCL) Take 1 tablet by mouth two times a day LEXAPRO 20 MG TABS (ESCITALOPRAM OXALATE) Take 1 tablet by mouth once a day LUNESTA 2 MG  TABS (ESZOPICLONE) Take one tablet once daily MULTIVITAMINS   TABS (MULTIPLE VITAMIN) Take one tablet once daily LYBREL 90-20 MCG  TABS (LEVONORGESTREL-ETHINYL ESTRAD) Take as directed XANAX 0.25 MG  TABS (ALPRAZOLAM) as needed VALTREX 500 MG TABS (VALACYCLOVIR HCL) 1 q 12 hrs x 3 days GABAPENTIN 300 MG CAPS (GABAPENTIN) Take 1 tablet by mouth three times a day and 2 tabs at bedtime  Current Allergies (reviewed today): ! SULFA ! DARVON  Past Medical History:    Reviewed history from 02/18/2007 and no changes required:       HEADACHE (ICD-784.0)       DEPRESSION (ICD-311)       ANEMIA-NOS (ICD-285.9)       Hx of BULIMIA, HX OF (ICD-V11.8)       Hx of ANOREXIA, HX OF (ICD-V15.89)       MENINGITIS NOS (ICD-322.9)       GLAUCOMA NEC (ICD-365.89)       HAY FEVER (ICD-477.0)       UPPER RESPIRATORY INFECTION  (URI) (ICD-465.9)       FIBROCYSTIC BREAST DISEASE (ICD-610.1)       LUMP OR MASS IN BREAST (ICD-611.72)       TYMPANIC MEMBRANE PERFORATION (ICD-384.20)       MENINGITIS NOS (ICD-322.9)       GLAUCOMA NEC (ICD-365.89)  Past Surgical History:    Reviewed history from 02/18/2007 and no changes required:       DILATION AND CURETTAGE, HX OF FOR MENOMETRORRHAGIA (ICD-V45.89)       DE QUERVAIN'S TENOSYNOVITIS (ICD-727.04)       INJURY, PERONEAL NERVE (ICD-956.3)       ADENOIDECTOMY, HX OF (ICD-V45.79)       TYMPANIC MEMBRANE PERFORATION (ICD-384.20)   Family History:    Reviewed history from 02/19/2007 and no changes required:       father-died 2, CHF,DM, HTN, LIPIDs, CVD       mother - died 29, CAD/MI,HTN, ovarian cancer  Maternal grandmother with breast cancer       Neg- colon cancer.  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       No h/o abuse.   Risk Factors: Tobacco use:  never Alcohol use:  yes    Type:  wine    Drinks per day:  1 Exercise:  yes    Times per week:  3    Type:  yoga Seatbelt use:  100 %  Mammogram History:    Date of Last Mammogram:  12/11/2006  PAP Smear History:    Date of Last PAP Smear:  01/23/2006   Review of Systems       The patient complains of weight gain.  The patient denies fever, vision loss, decreased hearing, hoarseness, chest pain, syncope, dyspnea on exertion, peripheral edema, prolonged cough, hemoptysis, abdominal pain, hematochezia, hematuria, muscle weakness, and enlarged lymph nodes.     Physical Exam  General:     alert, well-nourished, and cooperative to examination.   Head:     normocephalic and atraumatic.   Eyes:     vision grossly intact, pupils equal, pupils round, pupils reactive to light, and corneas and lenses clear.   Ears:     Slight scarring in R eardrum, normal L  eardrum, slight inflammation in L canal Nose:     no external deformity, no external erythema, and no nasal discharge.   Mouth:     no gingival abnormalities, no dental plaque, pharynx pink and moist, no erythema, and no exudates.   Neck:     supple, full ROM, no masses, and no thyromegaly.  Slight thyroid tenderness   Chest Wall:     no deformities, no tenderness, and no mass.   Breasts:     skin/areolae normal, no masses, no nipple discharge, no tenderness, no adenopathy. Right breast is larger then the left. Lungs:     normal respiratory effort, no accessory muscle use, normal breath sounds, and no dullness.   Heart:     normal rate, regular rhythm, no murmur, no gallop, no rub, and physiological split S2.   Abdomen:     soft, non-tender, normal bowel sounds, no guarding, no rigidity, and no hepatomegaly.   Genitalia:     normal introitus, no external lesions, no vaginal discharge, mucosa pink and moist, and no friaility or hemorrhage.  Noted small cervical polyp at 5 0 clock position.  Pap obtained. Msk:     normal ROM, no joint tenderness, no joint swelling, no joint warmth, and no joint instability.   Pulses:     2+ pulses radial Neurologic:     alert & oriented X3 and cranial nerves II-XII intact.   Skin:     turgor normal, color normal, no rashes, and no suspicious lesions.  Slight brusing on medial arm bilaterally. Cervical Nodes:     no anterior cervical adenopathy and no posterior cervical adenopathy.   Psych:     Oriented X3, memory intact for recent and remote, good eye contact, and not anxious appearing.      Impression & Recommendations:  Problem # 1:  EAR PAIN, BILATERAL (ICD-388.70) Slight irritation noted on exam.  Perscribe medication as noted below.  Her updated medication list for this problem includes:    Antibiotic Ear  3.5-10000-1 Soln (Neomycin-polymyxin-hc) .Marland Kitchen... 2 qtts to as qid x 5 days   Problem # 2:  WEIGHT GAIN (ICD-783.1) No unusual weight  gain noted, although uneven breast size noted to significant abnormalities were found on exam.  Patient BMI is well within normal range and she exercises regularily, and lipid panel showed excellent cholesterol managment. Patient advised to continue exercise regimen.  Problem # 3:  Gynecological examination-routine (ICD-V72.31) No major abnormalities observed on GYN exam.  Complete Medication List: 1)  Wellbutrin Xl 150 Mg Tb24 (Bupropion hcl) .... Take 1 tablet by mouth two times a day 2)  Lexapro 20 Mg Tabs (Escitalopram oxalate) .... Take 1 tablet by mouth once a day 3)  Lunesta 2 Mg Tabs (Eszopiclone) .... Take one tablet once daily 4)  Multivitamins Tabs (Multiple vitamin) .... Take one tablet once daily 5)  Lybrel 90-20 Mcg Tabs (Levonorgestrel-ethinyl estrad) .... Take as directed 6)  Xanax 0.25 Mg Tabs (Alprazolam) .... As needed 7)  Valtrex 500 Mg Tabs (Valacyclovir hcl) .Marland Kitchen.. 1 q 12 hrs x 3 days 8)  Gabapentin 300 Mg Caps (Gabapentin) .... Take 1 tablet by mouth three times a day and 2 tabs at bedtime 9)  Antibiotic Ear 3.5-10000-1 Soln (Neomycin-polymyxin-hc) .... 2 qtts to as qid x 5 days    Prescriptions: ANTIBIOTIC EAR 3.5-10000-1 SOLN (NEOMYCIN-POLYMYXIN-HC) 2 qtts to AS qid x 5 days  #10 cc x 0   Entered and Authorized by:   Jacques Navy MD   Signed by:   Jacques Navy MD on 02/28/2008   Method used:   [facility]       [address]       [place], [place]  [address]       Ph: [phone] or [phone]       Fax: [phone]   RxID:   [id number]  ]

## 2010-03-22 NOTE — Assessment & Plan Note (Signed)
Summary: NECK SHOULDER BACK PAIN-PER PT SCHED THIS DATE-STC   Vital Signs:  Patient Profile:   50 Years Old Female Height:     63 inches Weight:      111.5 pounds Temp:     98.9 degrees F oral Pulse rate:   74 / minute BP sitting:   112 / 69  (left arm) Cuff size:   regular  Pt. in pain?   yes    Location:   neck  Vitals Entered By: Zackery Barefoot CMA (February 19, 2007 1:38 PM)                  PCP:  Tayli Buch  Chief Complaint:  Right neck pain radiating down right arm.  History of Present Illness: Patient presents for painiin the neck with radiation down her right arm. Patient had c-spine films Aug '08. No paresthesia, no focal weakness. Has tried massage, tried chiropractic all without relief.  Current Allergies: No known allergies   Past Medical History:    Reviewed history from 02/18/2007 and no changes required:       HEADACHE (ICD-784.0)       DEPRESSION (ICD-311)       ANEMIA-NOS (ICD-285.9)       Hx of BULIMIA, HX OF (ICD-V11.8)       Hx of ANOREXIA, HX OF (ICD-V15.89)       MENINGITIS NOS (ICD-322.9)       GLAUCOMA NEC (ICD-365.89)       HAY FEVER (ICD-477.0)       UPPER RESPIRATORY INFECTION (URI) (ICD-465.9)       FIBROCYSTIC BREAST DISEASE (ICD-610.1)       LUMP OR MASS IN BREAST (ICD-611.72)       TYMPANIC MEMBRANE PERFORATION (ICD-384.20)       MENINGITIS NOS (ICD-322.9)       GLAUCOMA NEC (ICD-365.89)  Past Surgical History:    Reviewed history from 02/18/2007 and no changes required:       DILATION AND CURETTAGE, HX OF FOR MENOMETRORRHAGIA (ICD-V45.89)       DE QUERVAIN'S TENOSYNOVITIS (ICD-727.04)       INJURY, PERONEAL NERVE (ICD-956.3)       ADENOIDECTOMY, HX OF (ICD-V45.79)       TYMPANIC MEMBRANE PERFORATION (ICD-384.20)   Family History:    father-died 15, CHF,DM, HTN, LIPIDs, CVD    mother - died 53, CAD/MI,HTN, ovarian cancer    Maternal grandmother with breast cancer    Neg- colon cancer.  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    No h/o abuse.   Risk Factors:  Alcohol use:  yes    Type:  wine    Drinks per day:  1    Counseled to quit/cut down alcohol use:  no Exercise:  yes    Times per week:  3    Type:  yoga Seatbelt use:  100 %  Mammogram History:     Date of Last Mammogram:  12/11/2006    Results:  Normal Bilateral   PAP Smear History:     Date of Last PAP Smear:  01/23/2006    Results:  Normal    Review of Systems  The patient denies anorexia, fever, weight loss, vision loss, chest pain, peripheral edema, incontinence, difficulty walking, depression, and angioedema.     Physical Exam  General:     Well-developed,well-nourished,in no acute distress; alert,appropriate and cooperative  throughout examination Neurologic:     alert & oriented X3, cranial nerves II-XII intact, and strength normal in all extremities.  sensation to light touch, deep vibratory sensation, pin-prick decreased proximal Right UE posterior (C3-C4).    Impression & Recommendations:  Problem # 1:  NECK PAIN, CHRONIC (ICD-723.1) On-going neck pain with radiation to the right arm, more proximally than distally. ON exam there is decreased sensation, but preserved strength. Plain cervical films were normal in Aug '08. Suspect nerve impingement.  Plan: prednisone 40mg  x 3, 30 mg x 3, 20 mg x 3, 10mg  x 6. If pain recurs or is inadequately treated will get MRI. Also will provide vicodin 5/500 q6 as needed #15.  Complete Medication List: 1)  Lamictal 200 Mg Tabs (Lamotrigine) .... Take one tablet once daily 2)  Wellbutrin Xl 150 Mg Tb24 (Bupropion hcl) .... Take 1 tablet by mouth once a day 3)  Lexapro 10 Mg Tabs (Escitalopram oxalate) .... Take 1 tablet by mouth once a day 4)  Lunesta 2 Mg Tabs (Eszopiclone) .... Take one tablet once daily 5)  Multivitamins Tabs (Multiple vitamin) .... Take one tablet once daily 6)  Lybrel 90-20 Mcg Tabs  (Levonorgestrel-ethinyl estrad) .... Take as directed 7)  Zantac 150 Mg Tabs (Ranitidine hcl) .... Take two times a day 8)  Xanax 0.25 Mg Tabs (Alprazolam) .... As needed 9)  Prednisone 10 Mg Tabs (Prednisone) .... 4 tabs once daily x 3, 3 tabs, once daily x 3, 2 tabs once daily x 3 , 1 tab once daily x 6     Prescriptions: PREDNISONE 10 MG  TABS (PREDNISONE) 4 tabs once daily x 3, 3 tabs, once daily x 3, 2 tabs once daily x 3 , 1 tab once daily x 6  #33 x 0   Entered and Authorized by:   Jacques Navy MD   Signed by:   Jacques Navy MD on 02/19/2007   Method used:   Elect[facility] to ...       [id number]       [address]       [place], [place]  [address]       Ph: [phone] or [phone]       Fax: [phone]   RxID:   [id number]  ]

## 2010-03-22 NOTE — Progress Notes (Signed)
Summary: GI APT?   Phone Note Call from Patient Call back at Home Phone [phone]   Summary of Call: Patient has an apt w/GI Monday, should she keep this apt?  Initial call taken by: Lamar Sprinkles, CMA,  July 29, 2009 2:53 PM  Follow-up for Phone Call        OK to keep appt with GI Follow-up by: Jacques Navy MD,  July 29, 2009 3:56 PM  Additional Follow-up for Phone Call Additional follow up Details #1::        left vm for pt on hm # Additional Follow-up by: Lamar Sprinkles, CMA,  July 29, 2009 4:24 PM

## 2010-03-22 NOTE — Progress Notes (Signed)
Summary: Call Report  Phone Note Other Incoming   Caller: [facility] Call Report Summary of Call: [facility] Triage Call Report Triage Record Num: [id number] Operator: Jaci Carrel Patient Name: Jordan [name] Call Date & Time: 08/08/2009 10:46:08AM Patient Phone: [phone] PCP: Illene Regulus Patient Gender: Female PCP Fax : [phone] Patient DOB: Mar 14, 1960 Practice Name: [facility] Reason for Call: LMP 1 month ago. BCP- Pt calling with concerns about blood patch 6.16. She states that she has had a headache/fever. As long as she is supine she feels "ok." 99.5O. She is unable to touch chin to chest at this time- states that it "hurts." She also has had several episodes of black, tarry stools. Pt spoke with triage RN last pm and her sx do not seem to be improving. Advised ED for eval. Protocol(s) Used: Headache Recommended Outcome per Protocol: See ED Immediately Reason for Outcome: New onset of stiff neck, severe generalized headache, vomiting Care Advice:  ~ Another adult should drive.  ~ Do not give the patient anything to eat or drink. 06/ Initial call taken by: Margaret Pyle, CMA,  August 09, 2009 10:17 AM  Follow-up for Phone Call        reviewed ED note from June 19. Labs were normal. Patient declined LP for infection or MRI. She was to call [facility] for open MRI scan and return to neurologist. She signed informed consent.  Follow-up by: Jacques Navy MD,  August 09, 2009 6:29 PM

## 2010-03-22 NOTE — Assessment & Plan Note (Signed)
Summary: discuss dbl / cd   Vital Signs:  Patient profile:   50 year old female Height:      62 inches Weight:      118 pounds BMI:     21.66 O2 Sat:      98 % on Room air Temp:     98.5 degrees F oral Pulse rate:   74 / minute BP sitting:   98 / 60  (left arm) Cuff size:   regular  Vitals Entered By: Bill Salinas CMA (August 20, 2009 2:41 PM)  O2 Flow:  Room air Comments Pt is no longer taking Savella   Primary Care Provider:  Laquan Beier   History of Present Illness: In the interval since her last visit Dr. Stoney Bang, neurologist who is following her for possible MS, ordered syphylis screening with FTA which returned as positive. Subsequently  she came to LP, complicated by post LP headache and need for blood patch, with a negative FTA-ab and negative olgioband study for MS.  She reports that she has had marked change in her abilty to carried out high level cognitive activities: her concentration is much worse, her executive organization is much worse. 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 She is more fatigued and has reduced endurance.   Current Medications (verified): 1)  Wellbutrin Xl 300 Mg Xr24h-Tab (Bupropion Hcl) .... Take 1 Tablet By Mouth Once A Day 2)  Lexapro 20 Mg Tabs (Escitalopram Oxalate) .... Take 1/2 Tablet Once Daily 3)  Lunesta 3 Mg Tabs (Eszopiclone) .... Take 1 Tab By Mouth At Bedtime 4)  Multivitamins   Tabs (Multiple Vitamin) .... Take One Tablet Once Daily 5)  Lybrel 90-20 Mcg  Tabs (Levonorgestrel-Ethinyl Estrad) .... Take As Directed 6)  Alprazolam 0.5 Mg Tabs (Alprazolam) .Marland Kitchen.. 1 Tablet By Mouth Three Times A Day 7)  Gabapentin 300 Mg Caps (Gabapentin) .... Take 1 Tablet By Mouth Three Times A Day and 2 Tabs At Bedtime 8)  Savella 50 Mg Tabs (Milnacipran Hcl) .Marland Kitchen.. 1 Tablet By Mouth Two Times A Day 9)  Hydrocodone-Acetaminophen 5-325 Mg Tabs (Hydrocodone-Acetaminophen) .... Take 1 Tablet By Mouth Two Times A  Day As Needed 10)  Baclofen 10 Mg Tabs (Baclofen) .... Take 1 Tab Q 8hrs As Needed For Muscle Spasms 11)  Provigil 200 Mg Tabs (Modafinil) .Marland Kitchen.. 1 Tablet Once Daily As Needed 12)  Clonidine Hcl 0.1 Mg/24hr Ptwk (Clonidine Hcl) .Marland Kitchen.. 1 Patch Weekly As Directed 13)  Promethazine Hcl 12.5 Mg Tabs (Promethazine Hcl) .Marland Kitchen.. 1 Q 6 As Needed Nausea  Allergies (verified): No Known Drug Allergies  Past History:  Past Medical History: Last updated: 08/02/2009 HEADACHE (ICD-784.0) DEPRESSION (ICD-311) ANEMIA-NOS (ICD-285.9) Hx of BULIMIA, HX OF (ICD-V11.8) Hx of ANOREXIA, HX OF (ICD-V15.89) MENINGITIS NOS (ICD-322.9) GLAUCOMA NEC (ICD-365.89) HAY FEVER (ICD-477.0) UPPER RESPIRATORY INFECTION (URI) (ICD-465.9) FIBROCYSTIC BREAST DISEASE (ICD-610.1) LUMP OR MASS IN BREAST (ICD-611.72) TYMPANIC MEMBRANE PERFORATION (ICD-384.20) MENINGITIS NOS (ICD-322.9) GLAUCOMA NEC (ICD-365.89) Anal Fissure Anxiety Disorder Fibromyalgia Irritable Bowel Syndrome Seizures  Past Surgical History: Last updated: 02/18/2007 DILATION AND CURETTAGE, HX OF FOR MENOMETRORRHAGIA (ICD-V45.89) DE QUERVAIN'S TENOSYNOVITIS (ICD-727.04) INJURY, PERONEAL NERVE (ICD-956.3) ADENOIDECTOMY, HX OF (ICD-V45.79) TYMPANIC MEMBRANE PERFORATION (ICD-384.20)  Family History: Last updated: 03-14-2007 father-died 59, CHF,DM, HTN, LIPIDs, CVD mother - died 73, CAD/MI,HTN, ovarian cancer Maternal grandmother with breast cancer Neg- colon cancer.  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 No children No h/o abuse. Patient has never smoked.  Alcohol Use - yes 2-4/day Daily Caffeine Use 2/day Illicit Drug Use - no  Risk Factors: Alcohol Use: 1 (02/19/2007) Exercise: yes (02/19/2007)  Risk Factors: Smoking Status: never (08/02/2009)  Review of Systems       The patient complains of weight gain, headaches, and  depression.  The patient denies anorexia, fever, weight loss, vision loss, decreased hearing, chest pain, syncope, dyspnea on exertion, prolonged cough, abdominal pain, severe indigestion/heartburn, incontinence, muscle weakness, suspicious skin lesions, difficulty walking, unusual weight change, and enlarged lymph nodes.    Physical Exam  General:  WNWD white female, upset and frustrated. Head:  normocephalic and atraumatic.   Eyes:  C&S clear Lungs:  normal respiratory effort and normal breath sounds.   Heart:  normal rate and regular rhythm.   Msk:  no joint tenderness and no redness over joints.   Neurologic:  alert & oriented X3, cranial nerves II-XII intact, and gait normal.   Skin:  turgor normal and color normal.   Psych:  Oriented X3, normally interactive, good eye contact, tearful,  moderately anxious and depressed.     Impression & Recommendations:  Problem # 1:  MULTIPLE SCLEROSIS, PROGRESSIVE/RELAPSING (ICD-340) Reviewed Dr. Johnnette Litter last correspondence. The diagnosis of MS is not certain but there is MRI evidence. In regard to syphylis - neurosyphylis is very unlikely with negative FTA ab. No indication for IV antibiotics. IN regard to latent syphylis treatment with weekly IM penicillin G benzatine 2.4 M units x 3 weeks.   Plan - confirmatory serum testing           if positive will treat.           Will complete early retirement paperwork based on her inability to perform her job duties due to medical disability.  Orders: T-RPR (Syphilis) [phone]) T- * Misc. Laboratory test [phone])  Complete Medication List: 1)  Wellbutrin Xl 300 Mg Xr24h-tab (Bupropion hcl) .... Take 1 tablet by mouth once a day 2)  Lexapro 20 Mg Tabs (Escitalopram oxalate) .... Take 1/2 tablet once daily 3)  Lunesta 3 Mg Tabs (Eszopiclone) .... Take 1 tab by mouth at bedtime 4)  Multivitamins Tabs (Multiple vitamin) .... Take one tablet once daily 5)  Lybrel 90-20 Mcg Tabs  (Levonorgestrel-ethinyl estrad) .... Take as directed 6)  Alprazolam 0.5 Mg Tabs (Alprazolam) .Marland Kitchen.. 1 tablet by mouth three times a day 7)  Gabapentin 300 Mg Caps (Gabapentin) .... Take 1 tablet by mouth three times a day and 2 tabs at bedtime 8)  Savella 50 Mg Tabs (Milnacipran hcl) .Marland Kitchen.. 1 tablet by mouth two times a day 9)  Hydrocodone-acetaminophen 5-325 Mg Tabs (Hydrocodone-acetaminophen) .... Take 1 tablet by mouth two times a day as needed 10)  Baclofen 10 Mg Tabs (Baclofen) .... Take 1 tab q 8hrs as needed for muscle spasms 11)  Provigil 200 Mg Tabs (Modafinil) .Marland Kitchen.. 1 tablet once daily as needed 12)  Clonidine Hcl 0.1 Mg/24hr Ptwk (Clonidine hcl) .Marland Kitchen.. 1 patch weekly as directed 13)  Promethazine Hcl 12.5 Mg Tabs (Promethazine hcl) .Marland Kitchen.. 1 q 6 as needed nausea

## 2010-03-22 NOTE — Letter (Signed)
Summary: [facility] Instructions  [facility]  [address] [place], [place] [address]   Phone: [phone]  Fax: [phone]       Jordan [name]    1960/09/18    MRN: [id number]        Procedure Day /Date:THURSDAY 10/28/09     Arrival Time:10:30 AM     Procedure Time:12:30 PM     Location of Procedure:                     X  [facility] ( Outpatient Registration)                        PREPARATION FOR COLONOSCOPY WITH MOVIPREP/ENDO WITH PROPOFUL   Starting 5 days prior to your procedure 10/23/09 do not eat nuts, seeds, popcorn, corn, beans, peas,  salads, or any raw vegetables.  Do not take any fiber supplements (e.g. Metamucil, Citrucel, and Benefiber).  THE DAY BEFORE YOUR PROCEDURE         DATE: 10/27/09  DAY: WEDNESDAY  1.  Drink clear liquids the entire day-NO SOLID FOOD  2.  Do not drink anything colored red or purple.  Avoid juices with pulp.  No orange juice.  3.  Drink at least 64 oz. (8 glasses) of fluid/clear liquids during the day to prevent dehydration and help the prep work efficiently.  CLEAR LIQUIDS INCLUDE: Water Jello Ice Popsicles Tea (sugar ok, no milk/cream) Powdered fruit flavored drinks Coffee (sugar ok, no milk/cream) Gatorade Juice: apple, white grape, white cranberry  Lemonade Clear bullion, consomm, broth Carbonated beverages (any kind) Strained chicken noodle soup Hard Candy                             4.  In the morning, mix first dose of MoviPrep solution:    Empty 1 Pouch A and 1 Pouch B into the disposable container    Add lukewarm drinking water to the top line of the container. Mix to dissolve    Refrigerate (mixed solution should be used within 24 hrs)  5.  Begin drinking the prep at 5:00 p.m. The MoviPrep container is divided by 4 marks.   Every 15 minutes drink the solution down to the next mark (approximately 8 oz) until the full liter is complete.   6.  Follow completed prep with 16 oz of clear  liquid of your choice (Nothing red or purple).  Continue to drink clear liquids until bedtime.  7.  Before going to bed, mix second dose of MoviPrep solution:    Empty 1 Pouch A and 1 Pouch B into the disposable container    Add lukewarm drinking water to the top line of the container. Mix to dissolve    Refrigerate  THE DAY OF YOUR PROCEDURE      DATE: 10/28/09 DAY: THURSDAY  Beginning at 7:30 a.m. (5 hours before procedure):         1. Every 15 minutes, drink the solution down to the next mark (approx 8 oz) until the full liter is complete.  2. Follow completed prep with 16 oz. of clear liquid of your choice.    3. You may drink clear liquids until MIDNIGHT  NOTHING AFTER THEN.   MEDICATION INSTRUCTIONS  Unless otherwise instructed, you should take regular prescription medications with a small sip of water   as early as possible the morning of your  procedure.        OTHER INSTRUCTIONS  You will need a responsible adult at least 50 years of age to accompany you and drive you home.   This person must remain in the waiting room during your procedure.  Wear loose fitting clothing that is easily removed.  Leave jewelry and other valuables at home.  However, you may wish to bring a book to read or  an iPod/MP3 player to listen to music as you wait for your procedure to start.  Remove all body piercing jewelry and leave at home.  Total time from sign-in until discharge is approximately 2-3 hours.  You should go home directly after your procedure and rest.  You can resume normal activities the  day after your procedure.  The day of your procedure you should not:   Drive   Make legal decisions   Operate machinery   Drink alcohol   Return to work  You will receive specific instructions about eating, activities and medications before you leave.    The above instructions have been reviewed and explained to me by   _______________________    I fully understand  and can verbalize these instructions _____________________________ Date _________

## 2010-03-22 NOTE — Progress Notes (Signed)
Summary: back pain  Phone Note Call from Patient Call back at Home Phone [phone]   Caller: Patient Summary of Call: Patient called requesting most recent lab results for urine. Returned call and advised normal per letter. Patient c/o continued back pain and wanted to let MD know. Initial call taken by: Rock Nephew CMA,  June 16, 2009 3:34 PM  Follow-up for Phone Call        if she continues to have significant pain we can see her for reevaluation Follow-up by: Jacques Navy MD,  June 17, 2009 9:10 AM  Additional Follow-up for Phone Call Additional follow up Details #1::        left message on machine for pt to return my call. Margaret Pyle, CMA  June 17, 2009 11:54 AM     Additional Follow-up for Phone Call Additional follow up Details #2::    left mess to call office back to schedule work in office visit.  Follow-up by: Lamar Sprinkles, CMA,  June 17, 2009 3:31 PM

## 2010-03-22 NOTE — Progress Notes (Signed)
Summary: speak to nurse  Phone Note Call from Patient Call back at Home Phone [phone]   Caller: Patient Call For: Arlyce Dice Reason for Call: Talk to Nurse Summary of Call: Patient wants to speak to nurse regarding procedure she's scheduled for 9-8 Initial call taken by: Tawni Levy,  August 10, 2009 11:09 AM  Follow-up for Phone Call        Pt. is scheduled for an Endo/Colon w/ propofol at [facility] on 10-28-09 at 12:30pm.   Additional Follow-up for Phone Call Additional follow up Details #1::        Discussed with Pt she will be in Friday to get instructions. She stated "We are really Great".   Additional Follow-up by: Merri Ray CMA Duncan Dull),  August 10, 2009 11:28 AM

## 2010-03-22 NOTE — Progress Notes (Signed)
Summary: HA  Phone Note Call from Patient Call back at Home Phone [phone]   Summary of Call: Patient left message on triage that she still has a HA. Patient states that it is possibly a sinus HA and also notes that her neck does not hurt. Patient has tried Mucinex and Aleve with no relief. Any recommendations? Initial call taken by: Lucious Groves,  August 10, 2009 9:50 AM  Follow-up for Phone Call        try sudafed 30mg  two times a day  Follow-up by: Jacques Navy MD,  August 10, 2009 1:16 PM  Additional Follow-up for Phone Call Additional follow up Details #1::        Pt informed, she is feeling much better Additional Follow-up by: Lamar Sprinkles, CMA,  August 10, 2009 2:36 PM

## 2010-03-22 NOTE — Progress Notes (Signed)
Summary: FLU  Phone Note Call from Patient Call back at Work Phone [phone]   Summary of Call: Pt c/o "flu symptoms" x 4 days. C/o h/a, sore throat,some nausea in beginning, body aches & fever. No sinus drainage/congestion. She is taking tylenol and will try aleve for h/a & body aches. Today fever was 102. She has been in bed w/symptoms x 4 days, she feels somewhat better today. Does not want to come in office unless Dr thinks she should. Advised continued rest & if develops worsening symptoms we would see her in office. She will call back if needed. Initial call taken by: Lamar Sprinkles,  December 26, 2007 4:26 PM  Follow-up for Phone Call        Correct. I patient with ILI but no respiratory distress or severe symptoms then rest, tylenol, fluids, etc is best treatment along with self-isolation. The normal course is 7-10 days. If she develops respiratory symptoms we will see her. Follow-up by: Jacques Navy MD,  December 26, 2007 4:38 PM

## 2010-03-22 NOTE — Progress Notes (Signed)
Summary: NAUSEA/BACK/ABD PAIN  Phone Note Call from Patient   Summary of Call: Pt c/o increased back pain and nausea. Describes it as abdominal/back pain.   Initial call taken by: Lamar Sprinkles, CMA,  June 07, 2009 1:27 PM  Follow-up for Phone Call        spoke with pt and she states she is having lower back pain with nausea. Pt states she has had no vomitting but states she is extremely nauseated. Pt would like advisement on what she should  do? Follow-up by: Ami Bullins CMA,  June 07, 2009 2:03 PM  Additional Follow-up for Phone Call Additional follow up Details #1::        OK phenergan 12.5mg  1 by mouth q 6 as needed nausea # 30 and needs u/a. Pt informed, Hold phone note for u/a results.   Results are ready......................Marland KitchenLamar Sprinkles, CMA  June 07, 2009 4:12 PM  Additional Follow-up by: Lamar Sprinkles, CMA,  June 07, 2009 2:26 PM    Additional Follow-up for Phone Call Additional follow up Details #2::    UA negative. Follow-up by: Jacques Navy MD,  June 07, 2009 5:35 PM  Additional Follow-up for Phone Call Additional follow up Details #3:: Details for Additional Follow-up Action Taken: Left vm for pt Additional Follow-up by: Lamar Sprinkles, CMA,  June 07, 2009 6:21 PM  New/Updated Medications: PROMETHAZINE HCL 12.5 MG TABS (PROMETHAZINE HCL) 1 q 6 as needed nausea Prescriptions: PROMETHAZINE HCL 12.5 MG TABS (PROMETHAZINE HCL) 1 q 6 as needed nausea  #30 x 0   Entered by:   Lamar Sprinkles, CMA   Authorized by:   Jacques Navy MD   Signed by:   Lamar Sprinkles, CMA on 06/07/2009   Method used:   Electronically to        [id number]       [address]       [place], [place]  [address]       Ph: [phone] or [phone]       Fax: [phone]   RxID:   [id number]

## 2010-03-22 NOTE — Assessment & Plan Note (Signed)
Summary: fluttery feeling for 3 wks/cd - ok per ami   Vital Signs:  Patient profile:   50 year old female Height:      62 inches (157.48 cm) Weight:      117 pounds (53.18 kg) BMI:     21.48 O2 Sat:      99 % on Room air Temp:     98.1 degrees F (36.72 degrees C) oral Pulse rate:   99 / minute Pulse rhythm:   regular BP sitting:   112 / 68  (left arm) Cuff size:   regular  Vitals Entered By: Brenton Grills (June 01, 2009 9:42 AM)  O2 Flow:  Room air CC: pt c/o palpitations/flushing/fast heart rate x 3 weeks/pt states that it is reoccuring/pt also notes sharp "stabbing" pain on left side near breast at times/aj   Primary Care Provider:  [facility]  CC:  pt c/o palpitations/flushing/fast heart rate x 3 weeks/pt states that it is reoccuring/pt also notes sharp "stabbing" pain on left side near breast at times/aj.  History of Present Illness: Presents due to a history of racing heart 3 weks ago: she felt hot and flushed; BP was elevated; pulse was rapid; chest was tight ; tremulous. Nurse line recommended EMS - patient declined hospital eval. She continues to have these episodes.   She reports that she is having epistaxis. She did see ENT  but failed to mention it. She is using Nasonex.  MS - she is working at home. She has note from neurology that states her diagnosis. She is on a clonidine patch to help with the pain associated with MS - she does not report any improvement. She may have a neuropathy. Dr. Tinnie Gens and partner are managing this problem. She is weaker and is using a cane 50%+ of the time. She is unable to work regular hours due to fatigue.  She needs a local urologist. Will refer to Dr. Barron Alvine.  She has had a sleep study - results pending.   Health Maintenance: [facility] reminder) She is due for pelvic/Pap this year or next; she is due for mammogram.       Current Medications (verified): 1)  Wellbutrin Xl 300 Mg Xr24h-Tab (Bupropion Hcl) .... Take 1 Tablet By  Mouth Once A Day 2)  Lexapro 20 Mg Tabs (Escitalopram Oxalate) .... Take 1/2 Tablet Once Daily 3)  Lunesta 3 Mg  Tabs (Eszopiclone) .... Take One Tablet At Bedtime 4)  Multivitamins   Tabs (Multiple Vitamin) .... Take One Tablet Once Daily 5)  Lybrel 90-20 Mcg  Tabs (Levonorgestrel-Ethinyl Estrad) .... Take As Directed 6)  Alprazolam 0.5 Mg Tabs (Alprazolam) .Marland Kitchen.. 1 Tablet By Mouth Three Times A Day 7)  Gabapentin 300 Mg Caps (Gabapentin) .... Take 1 Tablet By Mouth Three Times A Day and 2 Tabs At Bedtime 8)  Savella 50 Mg Tabs (Milnacipran Hcl) .Marland Kitchen.. 1 Tablet By Mouth Two Times A Day 9)  Hydrocodone-Acetaminophen 5-325 Mg Tabs (Hydrocodone-Acetaminophen) .... Take 1 Tablet By Mouth Once Daily 10)  Baclofen 10 Mg Tabs (Baclofen) .... Take 1 Tab Q 8hrs As Needed For Muscle Spasms 11)  Provigil 200 Mg Tabs (Modafinil) .Marland Kitchen.. 1 Tablet Once Daily As Needed 12)  Nasonex 50 Mcg/act Susp (Mometasone Furoate) .Marland Kitchen.. 1 Spray Once Daily 13)  Clonidine Hcl 0.1 Mg/24hr Ptwk (Clonidine Hcl) .Marland Kitchen.. 1 Patch Weekly As Directed  Allergies (verified): 1)  ! Sulfa 2)  ! Darvon  Past History:  Past Medical History: Last updated: 02/18/2007 HEADACHE (ICD-784.0) DEPRESSION (  ICD-311) ANEMIA-NOS (ICD-285.9) Hx of BULIMIA, HX OF (ICD-V11.8) Hx of ANOREXIA, HX OF (ICD-V15.89) MENINGITIS NOS (ICD-322.9) GLAUCOMA NEC (ICD-365.89) HAY FEVER (ICD-477.0) UPPER RESPIRATORY INFECTION (URI) (ICD-465.9) FIBROCYSTIC BREAST DISEASE (ICD-610.1) LUMP OR MASS IN BREAST (ICD-611.72) TYMPANIC MEMBRANE PERFORATION (ICD-384.20) MENINGITIS NOS (ICD-322.9) GLAUCOMA NEC (ICD-365.89)  Past Surgical History: Last updated: 02/18/2007 DILATION AND CURETTAGE, HX OF FOR MENOMETRORRHAGIA (ICD-V45.89) DE QUERVAIN'S TENOSYNOVITIS (ICD-727.04) INJURY, PERONEAL NERVE (ICD-956.3) ADENOIDECTOMY, HX OF (ICD-V45.79) TYMPANIC MEMBRANE PERFORATION (ICD-384.20)  Family History: Last updated: 03/19/07 father-died 31, CHF,DM, HTN, LIPIDs,  CVD mother - died 41, CAD/MI,HTN, ovarian cancer Maternal grandmother with breast cancer Neg- colon cancer.  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 No h/o abuse.  Review of Systems       The patient complains of muscle weakness.  The patient denies anorexia, fever, weight loss, weight gain, decreased hearing, hoarseness, chest pain, syncope, peripheral edema, headaches, hemoptysis, melena, suspicious skin lesions, difficulty walking, depression, and angioedema.    Physical Exam  General:  alert, well-developed, well-nourished, and well-hydrated.   Head:  normocephalic and atraumatic.   Eyes:  vision grossly intact, pupils equal, pupils round, corneas and lenses clear, and no injection.   Neck:  supple, full ROM, no masses, and no thyromegaly.   Chest Wall:  no deformities and no tenderness.   Lungs:  normal respiratory effort and normal breath sounds.   Heart:  normal rate, regular rhythm, no murmur, and no gallop.   Abdomen:  soft and normal bowel sounds.  Tender to deep palpation at the level of the umbilicus on the left.  Msk:  normal ROM, no joint tenderness, no joint swelling, no joint warmth, and no joint deformities.   Pulses:  2+ pulses radial and DP Neurologic:  alert & oriented X3, cranial nerves II-XII intact, strength normal in all extremities, sensation intact to light touch, gait normal, and DTRs symmetrical and normal.   Skin:  turgor normal, color normal, no rashes, no purpura, and no ulcerations.   Cervical Nodes:  no anterior cervical adenopathy and no posterior cervical adenopathy.   Psych:  Oriented X3, memory intact for recent and remote, normally interactive, and not anxious appearing.     Impression & Recommendations:  Problem # 1:  PALPITATIONS, RECURRENT (ICD-785.1) Patient with flusihing, palpitations, feeling panic rasing the possibility of Pheo.  Plan - 24 hr urine to r/o  pheo  Orders: T-Urine 24 Hr. 5 HIAA ([id number]) T-Urine 24 Hr. Catecholamines [phone]) T-Urine 24 Hr. Metanephrines [phone])  Problem # 2:  DEPRESSION (ICD-311) Patient reports that she is not feeling depressed.  Plan - continue present medications  Her updated medication list for this problem includes:    Wellbutrin Xl 300 Mg Xr24h-tab (Bupropion hcl) .Marland Kitchen... Take 1 tablet by mouth once a day    Lexapro 20 Mg Tabs (Escitalopram oxalate) .Marland Kitchen... Take 1/2 tablet once daily    Alprazolam 0.5 Mg Tabs (Alprazolam) .Marland Kitchen... 1 tablet by mouth three times a day  Problem # 3:  MULTIPLE SCLEROSIS, PROGRESSIVE/RELAPSING (ICD-340) Patient has been diagnosed with MS by Dr. Tinnie Gens, neurolgoist with [facility] (letter attached). She reports that she has progressive weakness and decreased stamina. This is affecting all aspects of her life including the ability to work, even from home. She is contemplating treatment prefering to take betaserone vs Tysabri. She is trying to do endurance exercise to preserve function. She is making application for early state retirement and disability.  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 She is concerned that this is related to her MS. She is requesting urology referral.  Plan - refer to Dr. Barron Alvine.  Problem # 5:  Preventive Health Care (ICD-V70.0) Patient's last pelvic/PAP was 2 years ago. She is advised to have exam every 2-3 years. She is due for mammography and will schedule this herself.   Complete Medication List: 1)  Wellbutrin Xl 300 Mg Xr24h-tab (Bupropion hcl) .... Take 1 tablet by mouth once a day 2)  Lexapro 20 Mg Tabs (Escitalopram oxalate) .... Take 1/2 tablet once daily 3)  Lunesta 3 Mg Tabs (eszopiclone)  .... Take one tablet at bedtime 4)  Multivitamins Tabs (Multiple vitamin) .... Take one tablet once daily 5)  Lybrel 90-20 Mcg Tabs (Levonorgestrel-ethinyl estrad)  .... Take as directed 6)  Alprazolam 0.5 Mg Tabs (Alprazolam) .Marland Kitchen.. 1 tablet by mouth three times a day 7)  Gabapentin 300 Mg Caps (Gabapentin) .... Take 1 tablet by mouth three times a day and 2 tabs at bedtime 8)  Savella 50 Mg Tabs (Milnacipran hcl) .Marland Kitchen.. 1 tablet by mouth two times a day 9)  Hydrocodone-acetaminophen 5-325 Mg Tabs (Hydrocodone-acetaminophen) .... Take 1 tablet by mouth once daily 10)  Baclofen 10 Mg Tabs (Baclofen) .... Take 1 tab q 8hrs as needed for muscle spasms 11)  Provigil 200 Mg Tabs (Modafinil) .Marland Kitchen.. 1 tablet once daily as needed 12)  Nasonex 50 Mcg/act Susp (Mometasone furoate) .Marland Kitchen.. 1 spray once daily 13)  Clonidine Hcl 0.1 Mg/24hr Ptwk (Clonidine hcl) .Marland Kitchen.. 1 patch weekly as directed  Other Orders: Urology Referral (Urology)

## 2010-03-22 NOTE — Progress Notes (Signed)
Summary: meloxicam side effects  Phone Note Call from Patient   Caller: [phone] Summary of Call: Pt recently given meloxicam for shoulder pain, pt says that it has not helped her pain. She has had black diarrhea & nausea & a "jittery feeling" since taking med 1 week ago. I advised pt to stop med until we called back. Initial call taken by: Lamar Sprinkles,  April 01, 2007 6:55 PM  Follow-up for Phone Call        Stop Meloxicam. Take OTC prilosec 1 by mouth two times a day.ROV  Dr Debby Bud when he is back Follow-up by: Tresa Garter MD,  April 02, 2007 1:30 PM  Additional Follow-up for Phone Call Additional follow up Details #1::        left mess to call office back ..................................................................Marland KitchenLamar Sprinkles  April 02, 2007 1:49 PM     Additional Follow-up for Phone Call Additional follow up Details #2::    Pt informed  Follow-up by: Lamar Sprinkles,  April 02, 2007 2:04 PM

## 2010-03-22 NOTE — Letter (Signed)
Summary: [facility]  [facility]   Imported By: Lester Cliffside 03/10/2009 07:43:28  _____________________________________________________________________  External Attachment:    Type:   Image     Comment:   External Document

## 2010-03-22 NOTE — Progress Notes (Signed)
Summary: Admit request  Phone Note Call from Patient Call back at Home Phone [phone]   Summary of Call: Patient left message on triage that order for blood patch was received. Patient notes that she lives alone and has a great deal of trouble after these procedures and requests to be admitted overnight. Please advise. Initial call taken by: Lucious Groves,  August 05, 2009 1:25 PM  Follow-up for Phone Call        Not a possiblility per MD for admit unless pt wanted to pay for it herself. Left vm for pt to call office back w/her status post-blood patch. Follow-up by: Lamar Sprinkles, CMA,  August 05, 2009 4:36 PM

## 2010-03-22 NOTE — Progress Notes (Signed)
Summary: Sinus complaints  Phone Note Call from Patient   Summary of Call: Pt c/o sinus congestion, pressure & pain x 1 wk. She also has had slight fever and yellow mucus. Patient is currently taking gen claritin with some relief of sinus symptoms. Any further suggestions from MD for symptoms?   Also wants to know name of ENT she has seen in the past. She would like to return to them for re-eval.  Initial call taken by: Lamar Sprinkles, CMA,  October 19, 2009 10:51 AM  Follow-up for Phone Call        1. ENT was Narda Bonds 2. Nasal saline spray as needed, generic sudafed 30mg  two times a day as needed. Continue claritin. For fever, purulent drainage - OV.  Follow-up by: Jacques Navy MD,  October 19, 2009 6:08 PM  Additional Follow-up for Phone Call Additional follow up Details #1::        Left detailed vm on hm # Additional Follow-up by: Lamar Sprinkles, CMA,  October 19, 2009 6:14 PM

## 2010-03-22 NOTE — Letter (Signed)
Summary: [facility]  [facility]   Imported By: Sherian Rein 05/11/2009 14:49:44  _____________________________________________________________________  External Attachment:    Type:   Image     Comment:   External Document

## 2010-03-22 NOTE — Miscellaneous (Signed)
Summary: RR meds  Clinical Lists Changes  Medications: Added new medication of ROBINUL-FORTE 2 MG  TABS (GLYCOPYRROLATE) Take one tab as needed for abd spasms - Signed Rx of ROBINUL-FORTE 2 MG  TABS (GLYCOPYRROLATE) Take one tab as needed for abd spasms;  #60 x 4;  Signed;  Entered by: Clide Cliff RN;  Authorized by: Meryl Dare MD [facility];  Method used: Electronic Observations: Added new observation of ALLERGY REV: Done (09/16/2007 16:41)    Prescriptions: ROBINUL-FORTE 2 MG  TABS (GLYCOPYRROLATE) Take one tab as needed for abd spasms  #60 x 4   Entered by:   Clide Cliff RN   Authorized by:   Meryl Dare MD [facility]   Signed by:   Clide Cliff RN on 09/16/2007   Method used:   Electronically sent to ...       [address]*       [address]       [place], [place]  [address]       Ph: [phone]       Fax: [phone]   RxID:   [id number]

## 2010-03-22 NOTE — Progress Notes (Signed)
Summary: APT FRI?  Phone Note Call from Patient   Summary of Call: Patient is requesting a wk in apt for continuing shoulder pain. Says that some nodules have developed. Ok wk in [place]?  Initial call taken by: Lamar Sprinkles,  December 05, 2007 1:53 PM  Follow-up for Phone Call        OK to add on to Friday's schedule Follow-up by: Jacques Navy MD,  December 05, 2007 6:02 PM  Additional Follow-up for Phone Call Additional follow up Details #1::        lf mess to call back, needs wk in apt at 1:50 Additional Follow-up by: Lamar Sprinkles,  December 06, 2007 10:13 AM    Additional Follow-up for Phone Call Additional follow up Details #2::    pt scheduled Follow-up by: Lamar Sprinkles,  December 06, 2007 11:43 AM

## 2010-03-22 NOTE — Letter (Signed)
   [facility] [address] [place], [place]  [address] Phone: [phone]      June 14, 2009   Jordan [name] [address] [place], [place] [address]  RE:  LAB RESULTS  Dear  Ms. Tozzi,  The following is an interpretation of your most recent lab tests.  Please take note of any instructions provided or changes to medications that have resulted from your lab work.    24 hr urine studies for catecholamiine, metanephrines and 5HIAA were normal.   Sincerely Yours,    Jacques Navy MD

## 2010-03-22 NOTE — Miscellaneous (Signed)
Summary: RR meds  Clinical Lists Changes  Medications: Added new medication of FLAGYL 500 MG  TABS (METRONIDAZOLE) Take one tab twice per day for 10 days - Signed Rx of FLAGYL 500 MG  TABS (METRONIDAZOLE) Take one tab twice per day for 10 days;  #20 x 0;  Signed;  Entered by: Clide Cliff RN;  Authorized by: Meryl Dare MD [facility];  Method used: Electronic Allergies: Added new allergy or adverse reaction of SULFA Added new allergy or adverse reaction of DARVON Observations: Added new observation of ALLERGY REV: Done (09/16/2007 15:28) Added new observation of NKA: F (09/16/2007 15:28)    Prescriptions: FLAGYL 500 MG  TABS (METRONIDAZOLE) Take one tab twice per day for 10 days  #20 x 0   Entered by:   Clide Cliff RN   Authorized by:   Meryl Dare MD [facility]   Signed by:   Clide Cliff RN on 09/16/2007   Method used:   Electronically sent to ...       [address]       [address]       [place], [place]  [address]       Ph: [phone]       Fax: [phone]   RxID:   [id number]

## 2010-03-22 NOTE — Assessment & Plan Note (Signed)
Summary: BACK PAIN PER PHONE NOTE/NWS   Vital Signs:  Patient profile:   50 year old female Height:      62 inches Weight:      117 pounds BMI:     21.48 O2 Sat:      99 % on Room air Temp:     98.3 degrees F oral Pulse rate:   96 / minute BP sitting:   118 / 76  (left arm) Cuff size:   regular  Vitals Entered By: Bill Salinas CMA (Jun 21, 2009 9:33 AM)  O2 Flow:  Room air CC: pt her for revaluation on back pain/ ab   Primary Care Provider:  Malakie Balis  CC:  pt her for revaluation on back pain/ ab.  History of Present Illness: Continues to have pain in the right flank and groin. She is still having "flight or fight" sensations with pin-point pupils and a rushed feeling. She does continue to have pain in the right flank, abdomen at the level of the umbilicus and right lower quadrant. No f/S.C, no weight loss, no gain in abdominal girth.   Current Medications (verified): 1)  Wellbutrin Xl 300 Mg Xr24h-Tab (Bupropion Hcl) .... Take 1 Tablet By Mouth Once A Day 2)  Lexapro 20 Mg Tabs (Escitalopram Oxalate) .... Take 1/2 Tablet Once Daily 3)  Lunesta 3 Mg  Tabs (Eszopiclone) .... Take One Tablet At Bedtime 4)  Multivitamins   Tabs (Multiple Vitamin) .... Take One Tablet Once Daily 5)  Lybrel 90-20 Mcg  Tabs (Levonorgestrel-Ethinyl Estrad) .... Take As Directed 6)  Alprazolam 0.5 Mg Tabs (Alprazolam) .Marland Kitchen.. 1 Tablet By Mouth Three Times A Day 7)  Gabapentin 300 Mg Caps (Gabapentin) .... Take 1 Tablet By Mouth Three Times A Day and 2 Tabs At Bedtime 8)  Savella 50 Mg Tabs (Milnacipran Hcl) .Marland Kitchen.. 1 Tablet By Mouth Two Times A Day 9)  Hydrocodone-Acetaminophen 5-325 Mg Tabs (Hydrocodone-Acetaminophen) .... Take 1 Tablet By Mouth Once Daily 10)  Baclofen 10 Mg Tabs (Baclofen) .... Take 1 Tab Q 8hrs As Needed For Muscle Spasms 11)  Provigil 200 Mg Tabs (Modafinil) .Marland Kitchen.. 1 Tablet Once Daily As Needed 12)  Nasonex 50 Mcg/act Susp (Mometasone Furoate) .Marland Kitchen.. 1 Spray Once Daily 13)  Clonidine Hcl 0.1  Mg/24hr Ptwk (Clonidine Hcl) .Marland Kitchen.. 1 Patch Weekly As Directed 14)  Promethazine Hcl 12.5 Mg Tabs (Promethazine Hcl) .Marland Kitchen.. 1 Q 6 As Needed Nausea  Allergies (verified): 1)  ! Sulfa 2)  ! Darvon  Past History:  Past Medical History: Last updated: 02/18/2007 HEADACHE (ICD-784.0) DEPRESSION (ICD-311) ANEMIA-NOS (ICD-285.9) Hx of BULIMIA, HX OF (ICD-V11.8) Hx of ANOREXIA, HX OF (ICD-V15.89) MENINGITIS NOS (ICD-322.9) GLAUCOMA NEC (ICD-365.89) HAY FEVER (ICD-477.0) UPPER RESPIRATORY INFECTION (URI) (ICD-465.9) FIBROCYSTIC BREAST DISEASE (ICD-610.1) LUMP OR MASS IN BREAST (ICD-611.72) TYMPANIC MEMBRANE PERFORATION (ICD-384.20) MENINGITIS NOS (ICD-322.9) GLAUCOMA NEC (ICD-365.89)  Past Surgical History: Last updated: 02/18/2007 DILATION AND CURETTAGE, HX OF FOR MENOMETRORRHAGIA (ICD-V45.89) DE QUERVAIN'S TENOSYNOVITIS (ICD-727.04) INJURY, PERONEAL NERVE (ICD-956.3) ADENOIDECTOMY, HX OF (ICD-V45.79) TYMPANIC MEMBRANE PERFORATION (ICD-384.20)  Family History: Last updated: Mar 02, 2007 father-died 55, CHF,DM, HTN, LIPIDs, CVD mother - died 86, CAD/MI,HTN, ovarian cancer Maternal grandmother with breast cancer Neg- colon cancer.  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  Risk Factors: Alcohol Use: 1 (March 02, 2007) Exercise: yes (03-02-07)  Risk Factors: Smoking Status: never (12/05/2006)  Review of Systems  The patient denies anorexia, fever, weight loss, decreased hearing,  chest pain, syncope, prolonged cough, abdominal pain, hematochezia, genital sores, suspicious skin lesions, unusual weight change, and enlarged lymph nodes.    Physical Exam  General:  WNWD white female in no acute distress Head:  Normocephalic and atraumatic without obvious abnormalities. No apparent alopecia or balding. Neck:  full ROM and no thyromegaly.   Lungs:  normal respiratory effort and normal  breath sounds.   Heart:  normal rate, regular rhythm, and no gallop.   Abdomen:  soft and normal bowel sounds.  Tender at right flank, right abdomen and right lower abdomen. No masses, no rebound. Neurologic:  alert & oriented X3, cranial nerves II-XII intact, and strength normal in all extremities.     Impression & Recommendations:  Problem # 1:  ABDOMINAL PAIN RIGHT LOWER QUADRANT (ICD-789.03)  Patient has r/o for pheo or carcinoid tumors by negative 24 hr urine studies. she continues to have signficant pain. Concern for renal disease vs ovarian disease.  Plan - CT abd/pelvis. if negative may need to consider transvaginal U/S to evaluate ovaries.   Orders: TLB-BMP (Basic Metabolic Panel-BMET) ([id number]) Radiology Referral (Radiology)  Complete Medication List: 1)  Wellbutrin Xl 300 Mg Xr24h-tab (Bupropion hcl) .... Take 1 tablet by mouth once a day 2)  Lexapro 20 Mg Tabs (Escitalopram oxalate) .... Take 1/2 tablet once daily 3)  Lunesta 3 Mg Tabs (eszopiclone)  .... Take one tablet at bedtime 4)  Multivitamins Tabs (Multiple vitamin) .... Take one tablet once daily 5)  Lybrel 90-20 Mcg Tabs (Levonorgestrel-ethinyl estrad) .... Take as directed 6)  Alprazolam 0.5 Mg Tabs (Alprazolam) .Marland Kitchen.. 1 tablet by mouth three times a day 7)  Gabapentin 300 Mg Caps (Gabapentin) .... Take 1 tablet by mouth three times a day and 2 tabs at bedtime 8)  Savella 50 Mg Tabs (Milnacipran hcl) .Marland Kitchen.. 1 tablet by mouth two times a day 9)  Hydrocodone-acetaminophen 5-325 Mg Tabs (Hydrocodone-acetaminophen) .... Take 1 tablet by mouth once daily 10)  Baclofen 10 Mg Tabs (Baclofen) .... Take 1 tab q 8hrs as needed for muscle spasms 11)  Provigil 200 Mg Tabs (Modafinil) .Marland Kitchen.. 1 tablet once daily as needed 12)  Nasonex 50 Mcg/act Susp (Mometasone furoate) .Marland Kitchen.. 1 spray once daily 13)  Clonidine Hcl 0.1 Mg/24hr Ptwk (Clonidine hcl) .Marland Kitchen.. 1 patch weekly as directed 14)  Promethazine Hcl 12.5 Mg Tabs (Promethazine  hcl) .Marland Kitchen.. 1 q 6 as needed nausea   Immunization History:  Tetanus/Td Immunization History:    Tetanus/Td:  historical (04/21/2005)

## 2010-03-22 NOTE — Progress Notes (Signed)
Summary: CX'D ENDO/COLON  Phone Note Outgoing Call Call back at [facility] Phone [phone]   Call placed by: Merri Ray CMA [facility]),  September 29, 2009 12:08 PM Summary of Call: PT CANCELLED ENDO/COLON SCHEDULED FOR 10/28/2009 AT [facility]. HAS NOT RESCHEDULED Initial call taken by: Merri Ray CMA [facility]),  September 29, 2009 12:08 PM

## 2010-03-22 NOTE — Progress Notes (Signed)
Summary: FTA TEST POSITIVE  Phone Note From Other Clinic   Caller: Dr Harriette Bouillon [phone] Summary of Call: Dr Eduard Roux called: Pt's FTA test was positive at 2.3. MD feels pt needs a lumbar puncture, if positive will need IV antibiotics. MD is expected back in office late pm. Gave Dr Tinnie Gens cell # for Dr Debby Bud. Initial call taken by: Lamar Sprinkles, CMA,  July 28, 2009 2:21 PM  Follow-up for Phone Call        Pt called to confirm that Dr Charolette Forward got in contact with Dr Debby Bud. I advised pt that MD was expected in the office late today or tomorrow. She will wait a call back from our office.  Follow-up by: Lamar Sprinkles, CMA,  July 28, 2009 3:27 PM  Additional Follow-up for Phone Call Additional follow up Details #1::        called DR. Jeffries cell # left a msg. order to [facility] to hve patient get LP by radiology tube #1 chemistries- Na, K, glucose, protein; tube #2  cell count, FTA (test for neuro-syphyllis); tube #3 oligoclonal IgG bands (OCBs) testing for MS Additional Follow-up by: Jacques Navy MD,  July 28, 2009 4:56 PM    Additional Follow-up for Phone Call Additional follow up Details #2::    Pt informed of referral. Do you need to see pt in office soon? or wait until results are back?  Follow-up by: Lamar Sprinkles, CMA,  July 28, 2009 6:52 PM

## 2010-03-22 NOTE — Progress Notes (Signed)
Summary: Results  Phone Note Outgoing Call   Reason for Call: Discuss lab or test results Summary of Call: please call patient: pap smear is normal. Thanks Initial call taken by: Jacques Navy MD,  March 04, 2008 1:39 PM  Follow-up for Phone Call        Called and left vmail on pt "work" number to return call. Follow-up by: Zackery Barefoot CMA,  March 05, 2008 5:48 PM  Additional Follow-up for Phone Call Additional follow up Details #1::        Called pt multiple times w/o an answer other the vmail on [phone], the # [phone] is disconnected. Dr. Debby Bud will you make a letter that I can mail to pt? Thanks! Additional Follow-up by: Zackery Barefoot CMA,  March 06, 2008 6:27 PM    Additional Follow-up for Phone Call Additional follow up Details #2::    Lab letter done Sunday  Jan 17th Follow-up by: Jacques Navy MD,  March 08, 2008 4:27 PM

## 2010-03-22 NOTE — Consult Note (Signed)
Summary: [facility]  [facility]   Imported By: Esmeralda Links D'jimraou 12/09/2007 15:06:40  _____________________________________________________________________  External Attachment:    Type:   Image     Comment:   External Document

## 2010-03-24 NOTE — Progress Notes (Signed)
Summary: REQ FOR RX  Phone Note Call from Patient Call back at Home Phone [phone]   Summary of Call: Pt is currently taking doxycycline from her dermatologist. She is c/o vaginal yeast infection and req rx from Dr Debby Bud.  Initial call taken by: Lamar Sprinkles, CMA,  February 03, 2010 4:38 PM  Follow-up for Phone Call        ok for fluconazole 150mg  1 tab one time.  Follow-up by: Jacques Navy MD,  February 03, 2010 7:03 PM  Additional Follow-up for Phone Call Additional follow up Details #1::        Left vm for pt to check w/pharm Additional Follow-up by: Lamar Sprinkles, CMA,  February 04, 2010 9:22 AM    New/Updated Medications: DIFLUCAN 150 MG TABS (FLUCONAZOLE) one dose Prescriptions: DIFLUCAN 150 MG TABS (FLUCONAZOLE) one dose  #1 x 0   Entered by:   Lamar Sprinkles, CMA   Authorized by:   Jacques Navy MD   Signed by:   Lamar Sprinkles, CMA on 02/04/2010   Method used:   [facility]  [address] [phone][facility] (retail)       [address]       [place], [place]  [address]       Ph: [phone]       Fax: [phone]   RxID:   [id number]

## 2010-03-24 NOTE — Assessment & Plan Note (Signed)
Summary: BACK PAIN  STC   Vital Signs:  Patient profile:   50 year old female Height:      62 inches Weight:      118 pounds BMI:     21.66 O2 Sat:      98 % on Room air Temp:     98.4 degrees F oral Pulse rate:   76 / minute BP sitting:   118 / 82  (left arm) Cuff size:   regular  Vitals Entered By: Bill Salinas CMA (February 17, 2010 4:03 PM)  O2 Flow:  Room air CC: pt here with c/o ongoing neck and back pain/ ab Comments pt is no longer taking lybrel, gabapentin, savella, clonidine lexapro or diflucan/ ab   Primary Care Provider:  [facility]  CC:  pt here with c/o ongoing neck and back pain/ ab.  History of Present Illness: Patient presents for recurrent pain and muscle spasm in the right trapezius region. This has been treated in the past with trigger point injection.  She has been following with Dr. Sandria Manly. The diagosis of MS is in question and she is willing to let go of this diagnosis. There is not strong evidence to support the diagnosis. She has had her medications changed: stopped vicodin and others (see updated med list) and is currently on lyrica 100mg  three times a day. she feels much better. She continues to work, walk her dog and she has started running. She admits to a real improvement in her life.   Current Medications (verified): 1)  Wellbutrin Xl 300 Mg Xr24h-Tab (Bupropion Hcl) .... Take 1 Tablet By Mouth Once A Day 2)  Lexapro 20 Mg Tabs (Escitalopram Oxalate) .... Take 1 Tablet Once Daily 3)  Lunesta 3 Mg Tabs (Eszopiclone) .... Take 1 Tab By Mouth At Bedtime 4)  Multivitamins   Tabs (Multiple Vitamin) .... Take One Tablet Once Daily 5)  Lybrel 90-20 Mcg  Tabs (Levonorgestrel-Ethinyl Estrad) .... Take As Directed 6)  Alprazolam 0.5 Mg Tabs (Alprazolam) .Marland Kitchen.. 1 Tablet By Mouth Three Times A Day 7)  Gabapentin 300 Mg Caps (Gabapentin) .... Take 1 Tablet By Mouth Three Times A Day and 2 Tabs At Bedtime 8)  Savella 50 Mg Tabs (Milnacipran Hcl) .Marland Kitchen.. 1 Tablet By Mouth  Two Times A Day 9)  Hydrocodone-Acetaminophen 5-325 Mg Tabs (Hydrocodone-Acetaminophen) .... Take 1 Tablet By Mouth Two Times A Day As Needed 10)  Baclofen 10 Mg Tabs (Baclofen) .... Take 1 Tab Q 8hrs As Needed For Muscle Spasms 11)  Provigil 200 Mg Tabs (Modafinil) .... 2 Tablet Once Daily As Needed 12)  Clonidine Hcl 0.1 Mg/24hr Ptwk (Clonidine Hcl) .Marland Kitchen.. 1 Patch Weekly As Directed 13)  Promethazine Hcl 12.5 Mg Tabs (Promethazine Hcl) .Marland Kitchen.. 1 Q 6 As Needed Nausea 14)  Tamsulosin Hcl 0.4 Mg Caps (Tamsulosin Hcl) .Marland Kitchen.. 1 Tab Daily 15)  Diflucan 150 Mg Tabs (Fluconazole) .... One Dose 16)  Lyrica 100 Mg Caps (Pregabalin) .Marland Kitchen.. 1 Capsule Three Times A Day 17)  Cymbalta 60 Mg Cpep (Duloxetine Hcl) .Marland Kitchen.. 1 Daily  Allergies (verified): No Known Drug Allergies  Past History:  Past Medical History: Last updated: 08/02/2009 HEADACHE (ICD-784.0) DEPRESSION (ICD-311) ANEMIA-NOS (ICD-285.9) Hx of BULIMIA, HX OF (ICD-V11.8) Hx of ANOREXIA, HX OF (ICD-V15.89) MENINGITIS NOS (ICD-322.9) GLAUCOMA NEC (ICD-365.89) HAY FEVER (ICD-477.0) UPPER RESPIRATORY INFECTION (URI) (ICD-465.9) FIBROCYSTIC BREAST DISEASE (ICD-610.1) LUMP OR MASS IN BREAST (ICD-611.72) TYMPANIC MEMBRANE PERFORATION (ICD-384.20) MENINGITIS NOS (ICD-322.9) GLAUCOMA NEC (ICD-365.89) Anal Fissure Anxiety Disorder Fibromyalgia Irritable Bowel Syndrome  Seizures  Past Surgical History: Last updated: 02/18/2007 DILATION AND CURETTAGE, HX OF FOR MENOMETRORRHAGIA (ICD-V45.89) DE QUERVAIN'S TENOSYNOVITIS (ICD-727.04) INJURY, PERONEAL NERVE (ICD-956.3) ADENOIDECTOMY, HX OF (ICD-V45.79) TYMPANIC MEMBRANE PERFORATION (ICD-384.20) FH reviewed for relevance, SH/Risk Factors reviewed for relevance  Review of Systems  The patient denies anorexia, fever, weight loss, weight gain, decreased hearing, hoarseness, chest pain, prolonged cough, abdominal pain, incontinence, muscle weakness, difficulty walking, and depression.    Physical  Exam  General:  WNWD white woman in no acute distress Head:  Normocephalic and atraumatic without obvious abnormalities. No apparent alopecia or balding. Eyes:  vision grossly intact, pupils equal, and pupils round.   Neck:  full ROM. Tight in the posterior superior right back with point tenderness but no palpable bulge Lungs:  normal respiratory effort.   Heart:  normal rate and regular rhythm.   Pulses:  2+ radial Neurologic:  alert & oriented X3, cranial nerves II-XII intact, and gait normal.   Skin:  turgor normal and color normal.   Cervical Nodes:  no anterior cervical adenopathy and no posterior cervical adenopathy.   Psych:  Oriented X3, memory intact for recent and remote, normally interactive, and good eye contact.     Impression & Recommendations:  Problem # 1:  NECK PAIN, CHRONIC (ICD-723.1)  point tenderness upper trapezius. Better after injection.  Plan - cyclobenzaprine as needed for muscle spasm.  The following medications were removed from the medication list:    Hydrocodone-acetaminophen 5-325 Mg Tabs (Hydrocodone-acetaminophen) .Marland Kitchen... Take 1 tablet by mouth two times a day as needed    Baclofen 10 Mg Tabs (Baclofen) .Marland Kitchen... Take 1 tab q 8hrs as needed for muscle spasms Her updated medication list for this problem includes:    Cyclobenzaprine Hcl 10 Mg Tabs (Cyclobenzaprine hcl) .Marland Kitchen... 1 by mouth q 8 as needed muscle spasm  Orders: Trigger Point Injection Single Tendon Origin/Insertion (16109) Depo- Medrol 40mg  (J1030)  Complete Medication List: 1)  Wellbutrin Xl 300 Mg Xr24h-tab (Bupropion hcl) .... Take 1 tablet by mouth once a day 2)  Lunesta 3 Mg Tabs (Eszopiclone) .... Take 1 tab by mouth at bedtime 3)  Multivitamins Tabs (Multiple vitamin) .... Take one tablet once daily 4)  Alprazolam 0.5 Mg Tabs (Alprazolam) .Marland Kitchen.. 1 tablet at bedtime and q 6 as needed 5)  Provigil 200 Mg Tabs (Modafinil) .Marland Kitchen.. 1 q am, 1/2 qpm 6)  Promethazine Hcl 12.5 Mg Tabs (Promethazine  hcl) .Marland Kitchen.. 1 q 6 as needed nausea 7)  Tamsulosin Hcl 0.4 Mg Caps (Tamsulosin hcl) .Marland Kitchen.. 1 tab daily 8)  Lyrica 100 Mg Caps (Pregabalin) .Marland Kitchen.. 1 capsule three times a day 9)  Cymbalta 60 Mg Cpep (Duloxetine hcl) .Marland Kitchen.. 1 daily 10)  Cyclobenzaprine Hcl 10 Mg Tabs (Cyclobenzaprine hcl) .Marland Kitchen.. 1 by mouth q 8 as needed muscle spasm Prescriptions: CYCLOBENZAPRINE HCL 10 MG TABS (CYCLOBENZAPRINE HCL) 1 by mouth q 8 as needed muscle spasm  #30 x 2   Entered and Authorized by:   Jacques Navy MD   Signed by:   Jacques Navy MD on 02/17/2010   Method used:   [id number]       [address]       [place], [place]  [address]       Ph: [phone] or [phone]       Fax: [phone]   RxID:   [id number]    Orders Added: 1)  Est. Patient Level II [66440]  2)  Trigger Point Injection Single Tendon Origin/Insertion [20551] 3)  Depo- Medrol 40mg  [J1030]     Procedure Note Last Tetanus: Historical (04/21/2005)  Injections: The patient complains of pain. Indication: acute pain  Procedure # 1: trigger point injection    Location: right superior trapezius    Technique: 1" 23g needle    Medication: 40 mg depomedrol    Anesthesia: 2% xylocain    Comment: informed verbal consent  Cleaned and prepped with: betadine Wound dressing: bandaid

## 2010-04-01 ENCOUNTER — Other Ambulatory Visit: Payer: Self-pay | Admitting: Neurology

## 2010-04-01 DIAGNOSIS — M79601 Pain in right arm: Secondary | ICD-10-CM

## 2010-04-07 ENCOUNTER — Ambulatory Visit
Admission: RE | Admit: 2010-04-07 | Discharge: 2010-04-07 | Disposition: A | Discharge status: Home / Self Care | Payer: Self-pay | Source: Ambulatory Visit | Attending: Neurology | Admitting: Neurology

## 2010-04-07 ENCOUNTER — Other Ambulatory Visit: Payer: Self-pay | Admitting: Obstetrics and Gynecology

## 2010-04-07 ENCOUNTER — Other Ambulatory Visit: Payer: Self-pay | Admitting: Neurology

## 2010-04-07 DIAGNOSIS — M79601 Pain in right arm: Secondary | ICD-10-CM

## 2010-05-04 ENCOUNTER — Telehealth: Payer: Self-pay | Admitting: Internal Medicine

## 2010-05-08 LAB — URINE CULTURE
Colony Count: NO GROWTH
Culture: NO GROWTH

## 2010-05-08 LAB — DIFFERENTIAL
Basophils Absolute: 0 10*3/uL (ref 0.0–0.1)
Basophils Relative: 1 % (ref 0–1)
Eosinophils Absolute: 0.1 10*3/uL (ref 0.0–0.7)
Eosinophils Relative: 2 % (ref 0–5)
Lymphocytes Relative: 17 % (ref 12–46)
Lymphs Abs: 1.1 10*3/uL (ref 0.7–4.0)
Monocytes Absolute: 0.4 10*3/uL (ref 0.1–1.0)
Monocytes Relative: 6 % (ref 3–12)
Neutro Abs: 5 10*3/uL (ref 1.7–7.7)
Neutrophils Relative %: 75 % (ref 43–77)

## 2010-05-08 LAB — CBC
HCT: 44.7 % (ref 36.0–46.0)
Hemoglobin: 15.7 g/dL — ABNORMAL HIGH (ref 12.0–15.0)
MCHC: 35.1 g/dL (ref 30.0–36.0)
MCV: 98.1 fL (ref 78.0–100.0)
Platelets: 204 10*3/uL (ref 150–400)
RBC: 4.55 MIL/uL (ref 3.87–5.11)
RDW: 12.7 % (ref 11.5–15.5)
WBC: 6.7 10*3/uL (ref 4.0–10.5)

## 2010-05-08 LAB — URINALYSIS, ROUTINE W REFLEX MICROSCOPIC
Bilirubin Urine: NEGATIVE
Glucose, UA: NEGATIVE mg/dL
Hgb urine dipstick: NEGATIVE
Ketones, ur: NEGATIVE mg/dL
Nitrite: NEGATIVE
Protein, ur: NEGATIVE mg/dL
Specific Gravity, Urine: 1.014 (ref 1.005–1.030)
Urobilinogen, UA: 0.2 mg/dL (ref 0.0–1.0)
pH: 6 (ref 5.0–8.0)

## 2010-05-08 LAB — COMPREHENSIVE METABOLIC PANEL
ALT: 14 U/L (ref 0–35)
AST: 17 U/L (ref 0–37)
Albumin: 3.9 g/dL (ref 3.5–5.2)
Alkaline Phosphatase: 57 U/L (ref 39–117)
BUN: 8 mg/dL (ref 6–23)
CO2: 25 mEq/L (ref 19–32)
Calcium: 9.3 mg/dL (ref 8.4–10.5)
Chloride: 109 mEq/L (ref 96–112)
Creatinine, Ser: 0.89 mg/dL (ref 0.4–1.2)
GFR calc Af Amer: >60 mL/min (ref >60)
GFR calc non Af Amer: >60 mL/min (ref >60)
Glucose, Bld: 91 mg/dL (ref 70–99)
Potassium: 4.1 mEq/L (ref 3.5–5.1)
Sodium: 140 mEq/L (ref 135–145)
Total Bilirubin: 0.5 mg/dL (ref 0.3–1.2)
Total Protein: 7 g/dL (ref 6.0–8.3)

## 2010-05-09 LAB — CSF CELL COUNT WITH DIFFERENTIAL
RBC Count, CSF: 51 /mm3 — ABNORMAL HIGH
Tube #: 2
WBC, CSF: 0 /mm3 (ref 0–5)

## 2010-05-09 LAB — MISCELLANEOUS TEST

## 2010-05-09 LAB — PROTEIN AND GLUCOSE, CSF
Glucose, CSF: 56 mg/dL (ref 43–76)
Total  Protein, CSF: 36 mg/dL (ref 15–45)

## 2010-05-09 LAB — OLIGOCLONAL BANDS, CSF + SERM

## 2010-05-10 NOTE — Progress Notes (Signed)
Summary: med inquiry  Phone Note Call from Patient Call back at Home Phone [phone]   Reason for Call: Talk to Doctor Summary of Call: Pt states she is scheduled for Hysteroscopy w/Dr Okey Dupre on 05/25/2010 and would like to speak w/you before to see if there are any considerations for her before the procedure.? Initial call taken by: Burnard Leigh [facility]),  May 04, 2010 5:00 PM  Follow-up for Phone Call        called patient and left a message - nothing special to due prior to hysteroscopy Follow-up by: Jacques Navy MD,  May 05, 2010 6:43 PM

## 2010-05-23 ENCOUNTER — Telehealth: Payer: Self-pay | Admitting: Internal Medicine

## 2010-05-23 NOTE — Telephone Encounter (Signed)
Pt has been prescribed a new medication by her psychiatrist, Dr Evelene Croon, Fanapt (Iloperidone). Pt is stating this medication is for the Tx of schizophrenia. She is perplexed as to why she would need to take this medication and if it will interfere w/her other meds.? She would like to speak w/you about this matter.

## 2010-05-23 NOTE — Telephone Encounter (Signed)
Called patient - no answer. Left a message: schizophrenic medication may be used to augment antidepressants.

## 2010-05-25 ENCOUNTER — Ambulatory Visit (HOSPITAL_COMMUNITY)
Admission: RE | Admit: 2010-05-25 | Discharge: 2010-05-25 | Disposition: A | Discharge status: Home / Self Care | Payer: 59 | Source: Ambulatory Visit | Attending: Obstetrics and Gynecology | Admitting: Obstetrics and Gynecology

## 2010-05-25 ENCOUNTER — Other Ambulatory Visit: Payer: Self-pay | Admitting: Obstetrics and Gynecology

## 2010-05-25 DIAGNOSIS — N920 Excessive and frequent menstruation with regular cycle: Secondary | ICD-10-CM | POA: Insufficient documentation

## 2010-05-25 LAB — CBC
HCT: 37.5 % (ref 36.0–46.0)
Hemoglobin: 12.8 g/dL (ref 12.0–15.0)
MCH: 32.8 pg (ref 26.0–34.0)
MCHC: 34.1 g/dL (ref 30.0–36.0)
MCV: 96.2 fL (ref 78.0–100.0)
Platelets: 152 10*3/uL (ref 150–400)
RBC: 3.9 MIL/uL (ref 3.87–5.11)
RDW: 12.9 % (ref 11.5–15.5)
WBC: 4.9 10*3/uL (ref 4.0–10.5)

## 2010-05-25 LAB — PREGNANCY, URINE: Preg Test, Ur: NEGATIVE

## 2010-06-14 ENCOUNTER — Ambulatory Visit (INDEPENDENT_AMBULATORY_CARE_PROVIDER_SITE_OTHER): Payer: 59 | Admitting: Internal Medicine

## 2010-06-14 VITALS — BP 100/60 | HR 68 | Temp 98.6°F | Wt 119.0 lb

## 2010-06-14 DIAGNOSIS — M546 Pain in thoracic spine: Secondary | ICD-10-CM

## 2010-06-15 DIAGNOSIS — M546 Pain in thoracic spine: Secondary | ICD-10-CM | POA: Insufficient documentation

## 2010-06-15 MED ORDER — METHYLPREDNISOLONE ACETATE 80 MG/ML IJ SUSP
40.0000 mg | Freq: Once | INTRAMUSCULAR | Status: AC
  Administered 2010-06-14: 40 mg via INTRAMUSCULAR

## 2010-06-15 NOTE — Progress Notes (Signed)
Subjective:    Patient ID: Jordan [name], female    DOB: 1960/03/30, 50 y.o.   MRN: [id number]  HPI Jordan [name] presents today for recurrent trigger point pain right back medial to scapula. In the interval since her last ov she has been followed by Dr. Sandria Manly. He opines that she does not have MS. She has been diagnosed with cervical radiculopathy but has not had repeat cervical imaging. She does c/o pain in the right neck, shoulder, proximal UE and right LE. She does admit to feeling better now than she has in the past. 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 She is going to the [employer].  Past Medical History  Diagnosis Date  . Headache   . Depressive disorder, not elsewhere classified   . Anemia, unspecified   . History of bulimia   . History of anorexia nervosa   . Meningitis, unspecified   . Other specified glaucoma   . Allergic rhinitis due to pollen   . Acute upper respiratory infections of unspecified site   . Diffuse cystic mastopathy   . Lump or mass in breast   . Perforation of tympanic membrane, unspecified   . Anal fissure   . Anxiety disorder   . Fibromyalgia   . IBS (irritable bowel syndrome)   . Seizures   . Radial styloid tenosynovitis   . 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 Dilation and curettage of uterus     History of Menometrorrhagia   Family History  Problem Relation Age of Onset  . Ovarian cancer Mother   . Heart disease Mother   . Hypertension Mother   . Coronary artery disease Mother   . Heart failure Father   . Hypertension Father   . Hyperlipidemia Father   . Diabetes Father   . Breast cancer Maternal Grandmother   . Colon cancer Neg Hx    History   Social History  . Marital Status: Divorced    Spouse Name: N/A    Number of Children: N/A  . Years of Education: N/A   Occupational History  . Not on file.   Social History Main Topics  . Smoking status: Never Smoker   . Smokeless tobacco:  Not on file  . Alcohol Use: Yes     2-4 / Day  . Drug Use: No  . Sexually Active: Not on file   Other Topics Concern  . Not on file   Social History Narrative   HSG, [employer] BA, App for [employer] - planner, roads, rail and landMarried 5 years - divorced, no childrenNo H/O abuseDaily Caffeine Use:  2        Review of Systems  Constitutional: Negative for fever, chills, activity change, fatigue and unexpected weight change.  HENT: Positive for neck stiffness. Negative for hearing loss, ear pain and congestion.   Eyes: Negative.   Respiratory: Negative for cough and shortness of breath.   Cardiovascular: Negative.   Gastrointestinal: Negative.   Genitourinary: Negative.   Musculoskeletal: Positive for back pain and arthralgias.  Neurological: Positive for headaches. Negative for dizziness, tremors and weakness.  Hematological: Negative.   Psychiatric/Behavioral: Negative for behavioral problems and dysphoric mood.       Objective:   Physical Exam WNWD white female in no distress HEENT - wnl Chest - no deformity. Point tenderness just medial to the right scapula. No muscle spasm. Lungs - clear Cor -RRR  Assessment & Plan:  1. Trigger point pain - right back.  Procedure: trigger pont injection Consent - verbal informed consent Location - right scapular area Prep - betadine and alcohol Meds - .5cc 2% xylocaine with epi, 40 mg depo-medrol Inject - using #23g 1" needle Tolerated well with good results. No complications Routine precautions given.

## 2010-06-16 ENCOUNTER — Telehealth: Payer: Self-pay | Admitting: *Deleted

## 2010-06-16 NOTE — Telephone Encounter (Signed)
1. May fax copy of most recent office note - this week- to Dr. Sandria Manly 2. She should tell him about her arm and leg pain and her concern about the cervical spine changes he mentioned.

## 2010-06-16 NOTE — Telephone Encounter (Signed)
Pt has OV with Dr Sandria Manly tomorrow am. She says needs some guidance to help her explain things to Dr Sandria Manly. Maybe Dr Debby Bud would speak with Dr Sandria Manly? Marland Kitchen... Please advise.

## 2010-06-17 NOTE — Telephone Encounter (Signed)
DONE

## 2010-06-21 ENCOUNTER — Other Ambulatory Visit: Payer: Self-pay | Admitting: Neurology

## 2010-06-21 DIAGNOSIS — S8410XA Injury of peroneal nerve at lower leg level, unspecified leg, initial encounter: Secondary | ICD-10-CM

## 2010-06-21 DIAGNOSIS — M79609 Pain in unspecified limb: Secondary | ICD-10-CM

## 2010-07-02 ENCOUNTER — Ambulatory Visit
Admission: RE | Admit: 2010-07-02 | Discharge: 2010-07-02 | Disposition: A | Discharge status: Home / Self Care | Payer: 59 | Source: Ambulatory Visit | Attending: Neurology | Admitting: Neurology

## 2010-07-02 DIAGNOSIS — M79609 Pain in unspecified limb: Secondary | ICD-10-CM

## 2010-07-02 DIAGNOSIS — S8410XA Injury of peroneal nerve at lower leg level, unspecified leg, initial encounter: Secondary | ICD-10-CM

## 2010-07-02 MED ORDER — GADOBENATE DIMEGLUMINE 529 MG/ML IV SOLN
10.0000 mL | Freq: Once | INTRAVENOUS | Status: AC | PRN
  Administered 2010-07-02: 10 mL via INTRAVENOUS

## 2010-07-04 ENCOUNTER — Other Ambulatory Visit: Payer: Self-pay | Admitting: Neurology

## 2010-07-04 DIAGNOSIS — M79609 Pain in unspecified limb: Secondary | ICD-10-CM

## 2010-07-04 DIAGNOSIS — R51 Headache: Secondary | ICD-10-CM

## 2010-07-06 NOTE — Op Note (Signed)
  NAME:  Jordan [name], Jordan [name] NO.:  [id number]  MEDICAL RECORD NO.:  [id number]           PATIENT TYPE:  O  LOCATION:  [facility]                          [facility]  PHYSICIAN:  Carrington Clamp, M.D. DATE OF BIRTH:  [birth year]  DATE OF PROCEDURE:  05/25/2010 DATE OF DISCHARGE:                              OPERATIVE REPORT   PREOPERATIVE DIAGNOSIS:  Menometrorrhagia and perimenopausal bleeding.  POSTOPERATIVE DIAGNOSIS:  Menometrorrhagia and perimenopausal bleeding.  PROCEDURES:  Dilation and curettage, hysteroscopy, Adiana and NovaSure ablation.  SURGEON:  Carrington Clamp, MD  ASSISTANTS:  None.  ANESTHESIA:  General LMA.  FINDINGS:  Fluffy endometrial cavity but no overt pathology seen.  SPECIMENS:  Uterine curettings to Pathology.  ESTIMATED BLOOD LOSS:  Minimal.  IV FLUIDS:  600 mL.  URINE OUTPUT:  Not measured.  COMPLICATIONS:  None.  TECHNIQUE:  After adequate general anesthesia was achieved, the patient was prepped and draped in the usual sterile fashion in dorsal lithotomy position.  The bladder was emptied with a red rubber catheter and speculum was placed in the vagina.  A single-tooth tenaculum was placed on the cervix and the cervix was dilated with Shawnie Pons dilators.  The uterine length was measured at 9 cm with a cervical length of 3 for a the cavity length of 6 cm.  The hysteroscopy was performed with glycine initially and indicated no overt pathology.  The Adiana procedures were performed for 60 seconds 60-second burns with release of the silicone matrix after all four quadrants were obtained.  Although direct visualization of the tubal orifices were not obtained, the Adiana instruments went in completely and easily into the tubal orifices.  Once that was achieved, the uterine cavity was curetted, and tissue was sent to pathology.  The uterine cavity was then flushed with normal saline and the NovaSure instrument was introduced without  complication.  The NovaSure proceeded for 45 seconds after the cavity assessment test passed.   The NovaSure instrument was then withdrawn from the cavity and good burn and contact was noted with the hysteroscope.  All instruments were then withdrawn from the uterus.  The tenaculum was released off the cervix.  A small amount of bleeding from a small tear was seen and a single stitch of 2-0 Vicryl was performed to help with hemostasis.  All instruments were then withdrawn from the vagina, and the patient tolerated the procedure well and was returned to recovery room in stable condition.     Carrington Clamp, M.D.     MH/MEDQ  D:  05/25/2010  T:  05/26/2010  Job:  [id number]  Electronically Signed by Carrington Clamp MD on 07/06/2010 02:48:10 PM

## 2010-07-08 NOTE — Assessment & Plan Note (Signed)
[facility]                           PRIMARY CARE OFFICE NOTE   JACIA, SICKMAN                       MRN:          [id number]  DATE:06/04/2006                            DOB:          [birth year]    Jordan [name] is a very pleasant 50 year old women who presents to  establish for ongoing continuity care.  She has recently moved back from  the [place] area.   CHIEF COMPLAINT:  1. Hematochezia.  The patient reports that she had onset of      significant rectal bleeding with bowel movement approximately one      week ago with initial heavy bleeding, which is now tapering off.      This only occurs with bowel movements.  She has had no pain or      discomfort.  She has no prior history of colon disease or other      episodes of hematochezia.  2. Neck pain.  The patient reports that she has pain at the right side      of her neck, really in the trapezius scalene muscles with radiation      to her right upper extremity extending down to her hand.  She      reports that she can not use a mouse with her right hand, nor can      she work in the garden doing any heavy digging with her right hand.      She has mild weakness.  She denies any paresthesia.  She has had no      known injury.  She has had no history of neck problems in the past.   PAST MEDICAL HISTORY:   SURGICAL:  1. The patient had multiple surgeries as a child for a perforated      tympanic membrane and the absence of an anvil, requiring      reconstructive surgery.  2. Adenoidectomy as a child.  3. Repair of a peroneal nerve injury from a ski boot in 1996 on the      right.  4. 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  5. A D&C August of 2007 for menometrorrhagia.   MEDICAL ILLNESSES:  1. Usual childhood disease.  2. A longstanding history of anorexia and bulimia, currently      controlled.  3. Anemia secondary to number 2.  4. Depression, on  multiple medications at this time.  She has had 3      hospitalizations for depression.  She had one episode of suicide      attempt with hospitalization as part of those 3.  5. Glaucoma and she is status post recent surgery.  6. Hayfever allergy.  7. Headache, which sounds like muscle tension type headache with      funnel distribution with nausea photophobia, persistent for several      days.  8. Spinal meningitis in 1998, it was viral.  The patient is a gravida      zero, para zero.   PHYSICIAN ROOSTER:  Local  physicians Dr. Colleen Can. [facility] for  cardiology.  The patient had a gynecologist and a family physician in  [place].  The patient had a psychiatrist in [place] as well named Dr.  Fredricka Bonine.   CURRENT MEDICATIONS:  1. Wellbutrin XL 350 mg daily.  2. Lamictal 200 mg daily.  3. Lexapro 10 mg daily.  4. Lybrel birth control.  5. Lunesta 3 mg nightly.  6. Xanax 0.5 mg q. 8 hours p.r.n.  7. Zyrtec p.r.n.  8. Multivitamin daily.   HABITS:  Alcohol averaging 3 ounces per week, no tobacco use.  The  patient does use caffeinated beverages.   FAMILY HISTORY:  History of alcoholism in grandfather and his sister.  Father had significant arthritis.  Mother died from ovarian cancer,  which was diagnosed at surgery for abdominal pain with rapid demise at  age 69.  Positive for breast cancer in her grandmother.  Positive for  hyperlipidemia in her father.  Positive for coronary artery disease in  both parents, her father having died at age 37 of an MI.  Positive for  stroke in her father her had self-diagnosed mini strokes.  Positive for  hypertension in both parents.  Positive for emotional mental disease in  her sister.  Positive for diabetes in her father.   SOCIAL HISTORY:  The patient completed a undergraduate degree and then  went on to get a masters degree in geography at [employer]  in [place].  She did work as a Industrial/product designer  most  recently employed in [place] in a very stressful job, which she is  now free of and happy to be free of it.  The patient was married for 5  years and divorced and remains single with no significant other.  She  reports that she is still very good friends with her former husband.  The patient's mother recently died, as noted, from ovarian cancer, which  was the reason for our patient moving back to [place].  She denies  any history of physical or sexual abuse.   REVIEW OF SYSTEMS:  The patient has had no fevers, sweats, chills,  weight loss or other constitutional symptoms.  The patient has  significant visual loss secondary to her glaucoma, currently controlled.  The patient is questioning whether she has an abscess tooth at the left  mandible with pain and discomfort in that area and significant  temperature sensitivity.  The patient has had some episodes of mild  chest pain.  She reports she had a full evaluation with Dr. Colleen Can.  [facility], including what sounds like a stress echo, which was negative.  Respiratory is negative.  The patient does admit to heartburn, does take  over-the-counter H2 blockers.  GU significant for recent episodes of  menometrorrhagia, for which she had a D&C as noted.  No other GU  complaints.  No musculoskeletal, dermatologic or neurologic complaints.   PHYSICAL EXAMINATION:  Temperature was 98.5, blood pressure 103/72,  pulse 73, weight 111.  GENERAL APPEARANCE:  This a well-nourished, slightly thin Caucasian  women in no acute distress.  NEUROLOGIC:  The patient has full range of motion of her neck with  flexion extension and rotation with no significant exacerbation of  discomfort.  She has mild tenderness in the posterior cervical scalene  and trapezius muscles on the right.  The patient has normal grip  strength.  She had normal proximal and distal motor strength otherwise. She had normal DTRs at 2+ at the biceps  and radial tendons.  The patient   had slightly decreased subjective sensation to light touch and pinprick  at her palms and fingertips.  She had slightly decreased deep vibratory  sensation.  RECTAL:  The patient had some mild erythematous skin changes in the  perirectal/perineal region.  She had normal sphincter tone.  She had no  visible hemorrhoids.  Anoscopy was performed.  Stool mucus was guaiac  negative above the anoscope.  Examination did reveal the patient to have  a ring of internal hemorrhoids, some of which looked like they had bled  recently.   ASSESSMENT/PLAN:  1. Hematochezia, most likely origin is the patient's internal      hemorrhoids.  Fortunately, these are painless.  She is advised to      do a sitz bath twice a day followed by the use of Anusol HC 2.5%      suppositories for 6 days.  She is to notify me if she has any      recurrent episodes of hematochezia and to notify me if she has any      episodes of rectal pain or discomfort.  2. Neck pain.  Suspect mild degenerative disk disease.  She has      minimal decrease in sensation with no profound or serious      radiculopathy.  I do not feel surgical referral is indicated at      this point.  The plan is to get full flexion and extension C-spine      series.  She is treated with non-steroidal anti-inflammatory      medication.  3. Headache.  By history this patient has what sounds like a muscle      tension type headache.  I would recommend the use of ibuprofen 600      mg to 800 mg q. 8 hours for recurrent headache.  4. Psychosocial.  I provided the patient with the names of [facility] and [facility] as possible psychiatrists.  She should      continue on her present medical regimen on which she seems to be      doing well.  I would be happy to provide refills in the interim      while she tries to establish with a psychiatrist for oversight      supervision of her medications.  5. Health maintenance.  The patient does need to  establish with a      gynecologist.  I have provided her the names of several people in      town including [facility] Clamp and [facility].  6. Ophthalmology.  Patient with known glaucoma.  She does need to      establish for ongoing followup.  I have recommended her to      [facility] or Dr. Chucky May as alternatives.   The patient did have laboratory at today's visit, which revealed a  normal CBC with a hemoglobin of 13.8 g, white count was normal at 4800.  Chemistries were normal with a glucose of 93, electrolytes were normal.  Kidney function was normal. Liver functions were normal. Cholesterol was  135, triglycerides 97, HDL was 50.3, LDL was 65. Thyroid function was  normal. Urinalysis with some blood, she was on her menstrual cycle.   The patient will need to return for a more complete physical exam, also  to follow up for her hematochezia, neck pain and other issues.  Rosalyn Gess Norins, MD  Electronically Signed   MEN/MedQ  DD: 06/04/2006  DT: 06/05/2006  Job #: [id number]   cc:   Franchot Erichsen

## 2010-07-09 ENCOUNTER — Ambulatory Visit
Admission: RE | Admit: 2010-07-09 | Discharge: 2010-07-09 | Disposition: A | Discharge status: Home / Self Care | Payer: 59 | Source: Ambulatory Visit | Attending: Neurology | Admitting: Neurology

## 2010-07-09 DIAGNOSIS — M79609 Pain in unspecified limb: Secondary | ICD-10-CM

## 2010-07-09 DIAGNOSIS — R51 Headache: Secondary | ICD-10-CM

## 2010-07-09 MED ORDER — GADOBENATE DIMEGLUMINE 529 MG/ML IV SOLN
10.0000 mL | Freq: Once | INTRAVENOUS | Status: AC | PRN
  Administered 2010-07-09: 10 mL via INTRAVENOUS

## 2010-08-15 ENCOUNTER — Encounter: Payer: Self-pay | Admitting: Internal Medicine

## 2010-08-15 ENCOUNTER — Ambulatory Visit (INDEPENDENT_AMBULATORY_CARE_PROVIDER_SITE_OTHER): Payer: 59 | Admitting: Internal Medicine

## 2010-08-15 VITALS — BP 108/68 | HR 97 | Temp 98.3°F | Resp 14 | Wt 118.0 lb

## 2010-08-15 DIAGNOSIS — T783XXA Angioneurotic edema, initial encounter: Secondary | ICD-10-CM

## 2010-08-15 NOTE — Progress Notes (Signed)
  Subjective:    Patient ID: Jordan [name], female    DOB: 08-Jun-1960, 50 y.o.   MRN: [id number]  HPI Jordan [name] presents for swelling of the left side of the tongue. This is an every day occurrence, usually later in the day. There is enough swelling to interfere with speech. She does have xerostomia as a side effect of medications. She is on cymbalta and nuvigil  Both of which can cause angioedma. She does have occasional swallowing difficulty.  PMH, FamHx and SocHx reviewed for any changes and relevance.   Review of Systems Review of Systems  Constitutional:  Negative for fever, chills, activity change and unexpected weight change.  HEENT:  Negative for hearing loss, ear pain, congestion, neck stiffness and postnasal drip. Negative for sore throat or swallowing problems. Negative for dental complaints.   Eyes: Negative for vision loss or change in visual acuity.  Respiratory: Negative for chest tightness and wheezing.   Cardiovascular: Negative for chest pain and palpitation. No decreased exercise tolerance Gastrointestinal: No change in bowel habit. No bloating or gas. No reflux or indigestion Genitourinary: Negative for urgency, frequency, flank pain and difficulty urinating.  Musculoskeletal: Negative for myalgias, back pain, arthralgias and gait problem.  Neurological: Negative for dizziness, tremors, weakness and headaches.  Hematological: Negative for adenopathy.  Psychiatric/Behavioral: Negative for behavioral problems and dysphoric mood.       Objective:   Physical Exam Vitals noted Gen'l- WNWD white woman in no distress HEENT - no obvious swelling of the tongue, uvula or other head and neck structures Pul - normal respirations.        Assessment & Plan:  ANGIOEDEMA - patient with recurrent swelling of the tongue. First issue is drug reactions.  Plan - she is to call Dr. Evelene Croon in regard to her current use of nuvigil and cymbalta as possible causative agents. If Dr. Evelene Croon  does not feel it is the meds will refer to  ENT/Allergy

## 2010-08-23 ENCOUNTER — Telehealth: Payer: Self-pay

## 2010-08-23 NOTE — Telephone Encounter (Signed)
Called home/mobile - no answer. Left msg tht I had called. Don't know what medicine she is asking for. She has flexeril on her med list already.

## 2010-08-23 NOTE — Telephone Encounter (Signed)
Patient called c/o shoulder and neck pain and would like for MD to send a rx to [facility] [address]

## 2010-08-25 NOTE — Telephone Encounter (Signed)
Left mess to call office back.   

## 2010-08-26 MED ORDER — NAPROXEN 500 MG PO TABS: 500.0000 mg | ORAL_TABLET | Freq: Two times a day (BID) | ORAL | Status: DC | PRN

## 2010-08-26 MED ORDER — RANITIDINE HCL 75 MG PO TABS: 75.0000 mg | ORAL_TABLET | Freq: Two times a day (BID) | ORAL | Status: DC

## 2010-08-26 NOTE — Telephone Encounter (Signed)
Spoke with patient. She had to stop lyrica due to confusion. She had RX for naproxen in the past but is out - this has helped her pain. I advised she try aleve 1 to 2 bid but she would like RX b/c it is cheaper. OK? Also, she is worried about possible stomach upset and would like advisement what to take along w/RX.

## 2010-08-26 NOTE — Telephone Encounter (Signed)
Naaprosyn 500mg  bid # 60 prn refills. Take ranitidine 75mg  bid to protect stomach. Still - watch for gastric irritation.

## 2010-08-26 NOTE — Telephone Encounter (Signed)
Left pt vm to check w/pharm

## 2010-09-08 ENCOUNTER — Ambulatory Visit (INDEPENDENT_AMBULATORY_CARE_PROVIDER_SITE_OTHER)
Admission: RE | Admit: 2010-09-08 | Discharge: 2010-09-08 | Disposition: A | Discharge status: Home / Self Care | Payer: 59 | Source: Ambulatory Visit | Attending: Internal Medicine | Admitting: Internal Medicine

## 2010-09-08 ENCOUNTER — Ambulatory Visit (INDEPENDENT_AMBULATORY_CARE_PROVIDER_SITE_OTHER): Payer: 59 | Admitting: Internal Medicine

## 2010-09-08 ENCOUNTER — Encounter: Payer: Self-pay | Admitting: Internal Medicine

## 2010-09-08 VITALS — BP 110/74 | HR 72 | Ht 63.0 in | Wt 113.0 lb

## 2010-09-08 DIAGNOSIS — M533 Sacrococcygeal disorders, not elsewhere classified: Secondary | ICD-10-CM

## 2010-09-08 MED ORDER — LIDOCAINE 5 % EX PTCH: 1.0000 | MEDICATED_PATCH | Freq: Two times a day (BID) | CUTANEOUS | Status: DC

## 2010-09-09 NOTE — Progress Notes (Signed)
RX called in verbally 

## 2010-09-09 NOTE — Progress Notes (Signed)
  Subjective:    Patient ID: Jordan [name], female    DOB: 05/01/1960, 50 y.o.   MRN: [id number]  HPI Jordan [name] returns for conitnued pain at the coccyx that started after riding horses. No drainage, fever or other acute symptoms  PMH, FamHx and SocHx reviewed for any changes and relevance.    Review of Systems Review of Systems  Constitutional:  Negative for fever, chills, activity change and unexpected weight change.  HEENT:  Negative for hearing loss, ear pain, congestion, neck stiffness and postnasal drip. Negative for sore throat or swallowing problems. Negative for dental complaints.   Eyes: Negative for vision loss or change in visual acuity.  Respiratory: Negative for chest tightness and wheezing.   Cardiovascular: Negative for chest pain and palpitation. No decreased exercise tolerance Gastrointestinal: No change in bowel habit. No bloating or gas. No reflux or indigestion Genitourinary: Negative for urgency, frequency, flank pain and difficulty urinating.  Musculoskeletal: Negative for myalgias, back pain, arthralgias and gait problem.  Neurological: Negative for dizziness, tremors, weakness and headaches.  Hematological: Negative for adenopathy.  Psychiatric/Behavioral: Negative for behavioral problems and dysphoric mood.       Objective:   Physical Exam Vitals noted. Back - visually no lesions or sinus tracts. Very tender to palpation over the coccyx       Assessment & Plan:  coccyxagina - very tender most likely due to bruising.  Plan - x-ray to r/o fracture           Use of lidoderm patch           Keep appointment with ortho.

## 2010-09-12 ENCOUNTER — Telehealth: Payer: Self-pay | Admitting: *Deleted

## 2010-09-12 NOTE — Telephone Encounter (Signed)
Pt c/o of multiple [place] bee stings; ambulance was dispatched to evaluate patient and made assessment that there was no allergy to the stings. Pt c/o residual swelling & itching at the sites and would like to know what she can do for these? LMOM to inform patient that: It will be normal to have swelling and itching at the sting sites for a few days and she can [after removing stingers and wash/cleanse the areas]:  Apply a cold pack or cloth filled with ice to reduce pain and swelling [10 minutes on/10 off intervals].  Try a pain reliever, such as ibuprofen (Advil, Motrin, others) or acetaminophen (Tylenol, others), to ease pain from bites or stings.  Apply a topical cream to ease pain and provide itch relief. Creams containing ingredients such as hydrocortisone, lidocaine or pramoxine may help control pain. Other creams, such as calamine lotion or those containing colloidal oatmeal or baking soda, can help soothe itchy skin.  Take an antihistamine containing diphenhydramine (Benadryl, others) or chlorpheniramine maleate (Chlor-Trimeton, others). Also, if swelling doesn't reduce in a few days, develop red streaks, any appearance of pus, fever, general tiredness, or sudden illness; she will need to be further assessed & evaluated.

## 2010-09-13 ENCOUNTER — Telehealth: Payer: Self-pay | Admitting: Internal Medicine

## 2010-09-13 ENCOUNTER — Other Ambulatory Visit (HOSPITAL_COMMUNITY): Payer: Self-pay | Admitting: Radiology

## 2010-09-13 DIAGNOSIS — N971 Female infertility of tubal origin: Secondary | ICD-10-CM

## 2010-09-13 NOTE — Telephone Encounter (Signed)
Agee with advice. MN

## 2010-09-13 NOTE — Telephone Encounter (Signed)
X-ray with no fracrure of the coccyx .

## 2010-09-14 NOTE — Telephone Encounter (Signed)
Patient informed. 

## 2010-09-16 ENCOUNTER — Telehealth: Payer: Self-pay | Admitting: Internal Medicine

## 2010-09-16 NOTE — Telephone Encounter (Signed)
Pt already informed, see other phone note.

## 2010-09-16 NOTE — Telephone Encounter (Signed)
Cal patinet - x-ray sacrum/coccyx normal - no fracture. Thanks

## 2010-09-19 ENCOUNTER — Ambulatory Visit (HOSPITAL_COMMUNITY): Payer: 59

## 2010-09-19 ENCOUNTER — Other Ambulatory Visit (HOSPITAL_COMMUNITY): Payer: Self-pay | Admitting: Obstetrics and Gynecology

## 2010-09-19 ENCOUNTER — Ambulatory Visit (HOSPITAL_COMMUNITY)
Admission: RE | Admit: 2010-09-19 | Discharge: 2010-09-19 | Disposition: A | Discharge status: Home / Self Care | Payer: 59 | Source: Ambulatory Visit | Attending: Obstetrics and Gynecology | Admitting: Obstetrics and Gynecology

## 2010-09-19 DIAGNOSIS — N971 Female infertility of tubal origin: Secondary | ICD-10-CM

## 2010-09-19 DIAGNOSIS — Z3049 Encounter for surveillance of other contraceptives: Secondary | ICD-10-CM | POA: Insufficient documentation

## 2010-09-19 MED ORDER — IOHEXOL 300 MG/ML  SOLN
3.0000 mL | Freq: Once | INTRAMUSCULAR | Status: AC | PRN
  Administered 2010-09-19: 3 mL

## 2010-11-17 LAB — CSF CELL COUNT WITH DIFFERENTIAL
RBC Count, CSF: 1 — ABNORMAL HIGH
Tube #: 3
WBC, CSF: 1

## 2010-11-17 LAB — ANGIOTENSIN CONVERTING ENZYME, CSF: Angio Convert Enzyme: 4 Units (ref <10)

## 2010-11-17 LAB — VDRL, CSF: VDRL Quant, CSF: NONREACTIVE

## 2010-11-17 LAB — OLIGOCLONAL BANDS, CSF + SERM
Albumin Index: 4.5 ratio (ref 0.0–9.0)
Albumin, CSF: 15 mg/dL (ref 0–35)
Albumin, Serum(Neph): 3360 mg/dL — ABNORMAL LOW (ref 3500–5200)
CSF Oligoclonal Bands: NEGATIVE
IgG Index, CSF: 0.52 ratio (ref 0.28–0.66)
IgG, CSF: 1.2 mg/dL (ref 0.0–6.0)
IgG, Serum: 521 mg/dL — ABNORMAL LOW (ref 768–1632)
IgG/Albumin Ratio, CSF: 0.08 ratio — ABNORMAL LOW (ref 0.09–0.25)
MS CNS IgG Synthesis Rate: <0 mg/d (ref <=8.0)

## 2010-11-17 LAB — PROTIME-INR
INR: 1.1
Prothrombin Time: 14.5

## 2010-11-17 LAB — PROTEIN AND GLUCOSE, CSF
Glucose, CSF: 56
Total  Protein, CSF: 32

## 2011-02-17 ENCOUNTER — Ambulatory Visit (HOSPITAL_COMMUNITY)
Admission: RE | Admit: 2011-02-17 | Discharge: 2011-02-17 | Disposition: A | Discharge status: Home / Self Care | Payer: 59 | Source: Ambulatory Visit | Attending: Psychiatry | Admitting: Psychiatry

## 2011-02-17 DIAGNOSIS — F331 Major depressive disorder, recurrent, moderate: Secondary | ICD-10-CM | POA: Insufficient documentation

## 2011-02-17 DIAGNOSIS — F411 Generalized anxiety disorder: Secondary | ICD-10-CM | POA: Insufficient documentation

## 2011-02-17 NOTE — BH Assessment (Signed)
Assessment Note   Jordan [name] is an 50 y.o. female who was referred for Psychiatric IOP by her outpatient psychiatrist, Barnett Abu, MD. Pt reports a history of depression and anxiety since childhood and she has been seeing Dr. Evelene Croon for medication management and Betsy Coder for therapy for the past 3 years. She reports her depressive symptoms have worsened over the past several weeks. These symptoms include crying spells, erratic sleep, poor appetite with nausea and vomiting, social withdrawal and loss of interests in usual activities such as cleaning her home and exercising. She reports severe anxiety, excessive worry and she describes herself as paranoid regarding her coworkers, whom she feels are talking about her. She reports feeling suicidal "for 15 minutes" within the past month but denies any current suicidal ideation. She has a history of one previous suicide attempt by overdose in 1996. She denies any homicidal ideation or hallucinations. She says she noticed she was drinking more recently, up to 3 glasses of wine, and has cut back. She denies substance abuse. She identifies her primary stressor as her job as a Engineer, building services for the [employer], which she does not find challenging or rewarding. She reports conflicts with coworkers. She has a conflictual relationship with her only sibling, a sister who lives in [place], and has no other family support but describes a group of supportive friends. She has had chronic medical problems and reports she was incorrectly diagnosed with multiple sclerosis and now appears to have fibromyalgia. She appears to have good insight to her mental health symptoms, reports good experiences with group therapy in the past and is motivated for treatment. She verbally contracts for safety and was given 24 hour crisis information.  Axis I: 296.32 Major Depressive Disorder, Recurrent, Moderate; 300.0 Anxiety Disorder NOS Axis II: Deferred Axis III:  Past  Medical History  Diagnosis Date  . Headache   . Depressive disorder, not elsewhere classified   . Anemia, unspecified   . History of bulimia   . History of anorexia nervosa   . Meningitis, unspecified   . Other specified glaucoma   . Allergic rhinitis due to pollen   . Acute upper respiratory infections of unspecified site   . Diffuse cystic mastopathy   . Lump or mass in breast   . Perforation of tympanic membrane, unspecified   . Anal fissure   . Anxiety disorder   . Fibromyalgia   . IBS (irritable bowel syndrome)   . Seizures   . Radial styloid tenosynovitis   . 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 Headache   . Depressive disorder, not elsewhere classified   . Anemia, unspecified   . History of bulimia   . History of anorexia nervosa   . Meningitis, unspecified   . Other specified glaucoma   . Allergic rhinitis due to pollen   . Acute upper respiratory infections of unspecified site   . Diffuse cystic mastopathy   . Lump or mass in breast   . Perforation of tympanic membrane, unspecified   . Anal fissure   . Anxiety disorder   . Fibromyalgia   . IBS (irritable bowel syndrome)   . Seizures   . Radial styloid tenosynovitis   . 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 Dilation and curettage of uterus  History of Menometrorrhagia    Family History:  Family History  Problem Relation Age of Onset  . Ovarian cancer Mother   . Heart disease Mother   . Hypertension Mother   . Coronary artery disease Mother   . Heart failure Father   . Hypertension Father   . Hyperlipidemia Father   . Diabetes Father   . Breast cancer Maternal Grandmother   . Colon cancer Neg Hx     Social History:  reports that she has never smoked. She has never used smokeless tobacco. She reports that she drinks alcohol. She reports that  she does not use illicit drugs.  Additional Social History:  Alcohol / Drug Use Pain Medications: None Prescriptions: None Over the Counter: None History of alcohol / drug use?: No history of alcohol / drug abuse Longest period of sobriety (when/how long): N/a Allergies: No Known Allergies  Home Medications:  Medications Prior to Admission  Medication Sig Dispense Refill  . ALPRAZolam (XANAX) 0.5 MG tablet Take 0.5 mg by mouth every 6 (six) hours as needed.        . Armodafinil (NUVIGIL) 250 MG tablet Take 250 mg by mouth daily.        Marland Kitchen buPROPion (WELLBUTRIN XL) 300 MG 24 hr tablet Take 300 mg by mouth daily.        . cyclobenzaprine (FLEXERIL) 10 MG tablet Take 10 mg by mouth every 8 (eight) hours as needed.        . DULoxetine (CYMBALTA) 60 MG capsule Take 60 mg by mouth daily.        Marland Kitchen lidocaine (LIDODERM) 5 % Place 1 patch onto the skin every 12 (twelve) hours. Remove & Discard patch within 12 hours or as directed by MD  10 patch  1  . Multiple Vitamins-Minerals (MULTIVITAMIN,TX-MINERALS) tablet Take 1 tablet by mouth daily.        . naproxen (NAPROSYN) 500 MG tablet Take 1 tablet (500 mg total) by mouth 2 (two) times daily as needed.  180 tablet  2  . promethazine (PHENERGAN) 12.5 MG tablet Take 12.5 mg by mouth every 6 (six) hours as needed.        . ranitidine (RANITIDINE 75) 75 MG tablet Take 1 tablet (75 mg total) by mouth 2 (two) times daily. Take with naproxen  180 tablet  3  . TRAZODONE HCL PO Take 3 each by mouth at bedtime. Patient is unsure of [mg] taking        No current facility-administered medications on file as of 02/17/2011.    OB/GYN Status:  No LMP recorded. Patient has had an ablation.  General Assessment Data Location of Assessment: [facility] Assessment Services Living Arrangements: Alone (Has pet dog) Can pt return to current living arrangement?: Yes Admission Status: Voluntary Is patient capable of signing voluntary admission?: Yes Transfer from:  Home Referral Source: Psychiatrist Barnett Abu, MD)  Education Status Is patient currently in school?: No  Risk to self Suicidal Ideation: No Suicidal Intent: No Is patient at risk for suicide?: No Suicidal Plan?: No Access to Means: No What has been your use of drugs/alcohol within the last 12 months?: Pt reports she has drank up to 3 glasses of wine daily. 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 Injurious Behavior: None Family Suicide History: No Recent stressful life  event(s): Other (Comment) (Dislikes job, conflicts with sister) Persecutory voices/beliefs?: No Depression: Yes Depression Symptoms: Despondent;Insomnia;Tearfulness;Fatigue;Loss of interest in usual pleasures;Isolating Substance abuse history and/or treatment for substance abuse?: No Suicide prevention information given to non-admitted patients: Yes  Risk to Others Homicidal Ideation: No Thoughts of Harm to Others: No Current Homicidal Intent: No Current Homicidal Plan: No Access to Homicidal Means: No Identified Victim: None History of harm to others?: No Assessment of Violence: None Noted Violent Behavior Description: No history of violence. Does patient have access to weapons?: No Criminal Charges Pending?: No Does patient have a court date: No  Psychosis Hallucinations: None noted Delusions: Unspecified (Pt reports feeling paranoid at work.)  Mental Status Report Appear/Hygiene: Other (Comment) (Casually dressed, hygiene intack) Eye Contact: Good Motor Activity: Unremarkable Speech: Logical/coherent Level of Consciousness: Alert Mood: Anxious;Depressed Affect: Appropriate to circumstance Anxiety Level: Panic Attacks Panic attack frequency: Panic attacks over the past 3 weeks Most  recent panic attack: This week Thought Processes: Coherent;Relevant Judgement: Unimpaired Orientation: Person;Place;Time;Situation Obsessive Compulsive Thoughts/Behaviors: Minimal (Pt reports excessive worry, hx of compulsive behaviors)  Cognitive Functioning Concentration: Decreased Memory: Recent Intact;Remote Intact IQ: Average Insight: Good Impulse Control: Good Appetite: Poor (Poor appetite, trouble keeping food down) Weight Loss: 0  Weight Gain: 0  Sleep: Decreased Total Hours of Sleep: 6  (Pt reports erratic sleep pattern with under and over sleepin) Vegetative Symptoms: Staying in bed (Wants to stay in bed to avoid work)  Prior Inpatient Therapy Prior Inpatient Therapy: Yes Prior Therapy Dates: 1996; 1997; 2006 Prior Therapy Facilty/Provider(s): [facility]; [facility]; [facility] Reason for Treatment: Depression, Anxiety  Prior Outpatient Therapy Prior Outpatient Therapy: Yes Prior Therapy Dates: 2008-Current Prior Therapy Facilty/Provider(s): Barnett Abu MD & Betsy Coder Reason for Treatment: Depression and anxiety  ADL Screening (condition at time of admission) Patient's cognitive ability adequate to safely complete daily activities?: Yes Patient able to express need for assistance with ADLs?: Yes Independently performs ADLs?: Yes Weakness of Legs: None Weakness of Arms/Hands: None  Home Assistive Devices/Equipment Home Assistive Devices/Equipment: None          Advance Directives (For Healthcare) Advance Directive: Patient has advance directive, copy not in chart (Pt will bring Advanced Directive to Psych IOP) Type of Advance Directive: Healthcare Power of Attorney Advance Directive not in Chart: Copy requested from family Nutrition Screen Diet: Regular Unintentional weight loss greater than 10lbs within the last month: No Dysphagia: No Home Tube Feeding or Total Parenteral Nutrition (TPN): No Patient appears severely  malnourished: No Pregnant or Lactating: No Dietitian Consult Needed: No  Additional Information 1:1 In Past 12 Months?: No CIRT Risk: No Elopement Risk: No Does patient have medical clearance?: No     Disposition:  Disposition Disposition of Patient: Outpatient treatment Type of outpatient treatment: Psych Intensive Outpatient (Projected start date: 02/27/2011)  Current outpatient psychiatrist Barnett Abu, MD and therapist Betsy Coder were notified that patient presented for assessment and has been scheduled to start Psych IOP.   On Site Evaluation by:   Reviewed with Physician:     Patsy Baltimore, Harlin Rain 02/17/2011 3:03 PM

## 2011-03-14 ENCOUNTER — Telehealth: Payer: Self-pay

## 2011-03-14 MED ORDER — DIPHENOXYLATE-ATROPINE 2.5-0.025 MG PO TABS: 1.0000 | ORAL_TABLET | Freq: Four times a day (QID) | ORAL | Status: AC | PRN

## 2011-03-14 NOTE — Telephone Encounter (Signed)
Called and left msg: will call in lomotil. For fever, pain, blood in the stool or lack of response to lomotil will need OV.

## 2011-03-14 NOTE — Telephone Encounter (Signed)
The patient has had constant diarrhea for 2 weeks, constant thirst (wanting something sweet to drink). She is between jobs and has no insurance.  Please advise if OV or call something in. [facility] [address], call back number is [phone]

## 2011-05-08 ENCOUNTER — Telehealth: Payer: Self-pay

## 2011-05-08 DIAGNOSIS — H04123 Dry eye syndrome of bilateral lacrimal glands: Secondary | ICD-10-CM

## 2011-05-08 NOTE — Telephone Encounter (Signed)
Pt called stating it was suggested by her dentist, dermatologist and eye doctor that she be tested for Sjogren's. Pt would like to know if this can be done by MEN at upcoming OV?

## 2011-05-08 NOTE — Telephone Encounter (Signed)
sjogren's lab panel ordered - may come to lab any time

## 2011-05-09 NOTE — Telephone Encounter (Signed)
Pt advised of lab panel via VM

## 2011-05-11 ENCOUNTER — Ambulatory Visit (INDEPENDENT_AMBULATORY_CARE_PROVIDER_SITE_OTHER): Payer: Self-pay | Admitting: Internal Medicine

## 2011-05-11 ENCOUNTER — Encounter: Payer: Self-pay | Admitting: Internal Medicine

## 2011-05-11 ENCOUNTER — Other Ambulatory Visit: Payer: PRIVATE HEALTH INSURANCE

## 2011-05-11 VITALS — BP 112/70 | HR 88 | Temp 97.0°F | Ht 62.0 in | Wt 117.8 lb

## 2011-05-11 DIAGNOSIS — H04129 Dry eye syndrome of unspecified lacrimal gland: Secondary | ICD-10-CM

## 2011-05-11 DIAGNOSIS — M542 Cervicalgia: Secondary | ICD-10-CM

## 2011-05-11 DIAGNOSIS — H04123 Dry eye syndrome of bilateral lacrimal glands: Secondary | ICD-10-CM

## 2011-05-11 MED ORDER — METHYLPREDNISOLONE ACETATE 80 MG/ML IJ SUSP
40.0000 mg | Freq: Once | INTRAMUSCULAR | Status: AC
  Administered 2011-05-11: 40 mg via INTRAMUSCULAR

## 2011-05-11 MED ORDER — DOXYCYCLINE HYCLATE 100 MG PO TABS: 100.0000 mg | ORAL_TABLET | Freq: Two times a day (BID) | ORAL | Status: AC

## 2011-05-11 NOTE — Progress Notes (Signed)
Subjective:    Patient ID: Jordan [name], female    DOB: 04-Aug-1960, 51 y.o.   MRN: [id number]  HPI Jordan [name] presents for skin trouble. She had a lesion on the nose initially frozen off but it did not heal and she went to Dr. Meredith [name] at the skin surgery center for treatment-shave excision. She has had continued erythema and discomfort. She has had an outbreak on her chin of a lesion that now looks infected. Both areas recurrently get inflamed. She reports that she will have erythema on the malar surfaces and rarely in the glabellar region. She has never been diagnosed with  Rosacea.  She has dry eyes and and dry mouth and there is a concern for sjorgren's syndrome expressed by both her dentist and her opthamologist. Labs have been ordered.  She is have deep tissue pain in the right neck, a recurrent problem.  Past Medical History  Diagnosis Date  . Headache   . Depressive disorder, not elsewhere classified   . Anemia, unspecified   . History of bulimia   . History of anorexia nervosa   . Meningitis, unspecified   . Other specified glaucoma   . Allergic rhinitis due to pollen   . Acute upper respiratory infections of unspecified site   . Diffuse cystic mastopathy   . Lump or mass in breast   . Perforation of tympanic membrane, unspecified   . Anal fissure   . Anxiety disorder   . Fibromyalgia   . IBS (irritable bowel syndrome)   . Seizures   . Radial styloid tenosynovitis   . 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 Dilation and curettage of uterus     History of Menometrorrhagia   Family History  Problem Relation Age of Onset  . Ovarian cancer Mother   . Heart disease Mother   . Hypertension Mother   . Coronary artery disease Mother   . Heart failure Father   . Hypertension Father   . Hyperlipidemia Father   . Diabetes Father   . Breast cancer Maternal Grandmother   . Colon cancer Neg Hx    History   Social History  . Marital Status:  Divorced    Spouse Name: N/A    Number of Children: N/A  . Years of Education: N/A   Occupational History  . Not on file.   Social History Main Topics  . Smoking status: Never Smoker   . Smokeless tobacco: Never Used  . Alcohol Use: Yes     2-4 / Day  . Drug Use: No  . Sexually Active: Not on file   Other Topics Concern  . Not on file   Social History Narrative   [employer], [employer] BA, [employer] for MA. 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 Married 5 years - divorced, no children       Review of Systems System review is negative for any constitutional, cardiac, pulmonary, GI or neuro symptoms or complaints other than as described in the HPI.     Objective:   Physical Exam Filed Vitals:   05/11/11 1426  BP: 112/70  Pulse: 88  Temp: 97 F (36.1 C)   Weight: 117 lb 12.8 oz (53.434 kg)  Gen'l- WNWD white woman in no distress HEENT - [place]/AT Cor - RRR Pulm - normal respirations. MSK- tenderat the right neck Derm - erythema across the bridge of her nose. Chin with  erythema and a 2 mm pustule right of center.   Procedure: trigger pont injection Consent - verbal informed consent Location - right trapezius region Prep - betadine and alcohol Meds - .5cc 2% xylocaine with epi, 40 mg depo-medrol Inject - using #23g 1" needle Tolerated well with good results. No complications Routine precautions given.           Assessment & Plan:  Pustule/chin -  Plan - doxycycline 100 mg bid x 10           Warm compresses           Avoid manipulating lesion  Trigger point pain - recurrent problem that does respond to trigger point injections. Plan - injection done.

## 2011-05-12 LAB — SJOGRENS SYNDROME-A EXTRACTABLE NUCLEAR ANTIBODY: SSA (Ro) (ENA) Antibody, IgG: 4 AU/mL (ref <30)

## 2011-05-12 LAB — SJOGRENS SYNDROME-B EXTRACTABLE NUCLEAR ANTIBODY: SSB (La) (ENA) Antibody, IgG: <1 AU/mL (ref <30)

## 2011-05-12 LAB — ANA: Anti Nuclear Antibody(ANA): NEGATIVE

## 2011-05-13 DIAGNOSIS — H04123 Dry eye syndrome of bilateral lacrimal glands: Secondary | ICD-10-CM | POA: Insufficient documentation

## 2011-05-14 ENCOUNTER — Encounter: Payer: Self-pay | Admitting: Internal Medicine

## 2011-05-17 IMAGING — RF DG FLUORO GUIDE NDL PLC/BX
1 series · 3 of 3 positions shown · non-contrast
Comparison: 06/20/2009

CLINICAL DATA: Multiple sclerosis, progressive, relapsing.  Testing
for neurosyphilis.

FLUORO GUIDED NEEDLE PLACEMENT - diagnostic lumbar puncture.

[Series 1: run · 3 of 3 slices shown]
[im 1/3]
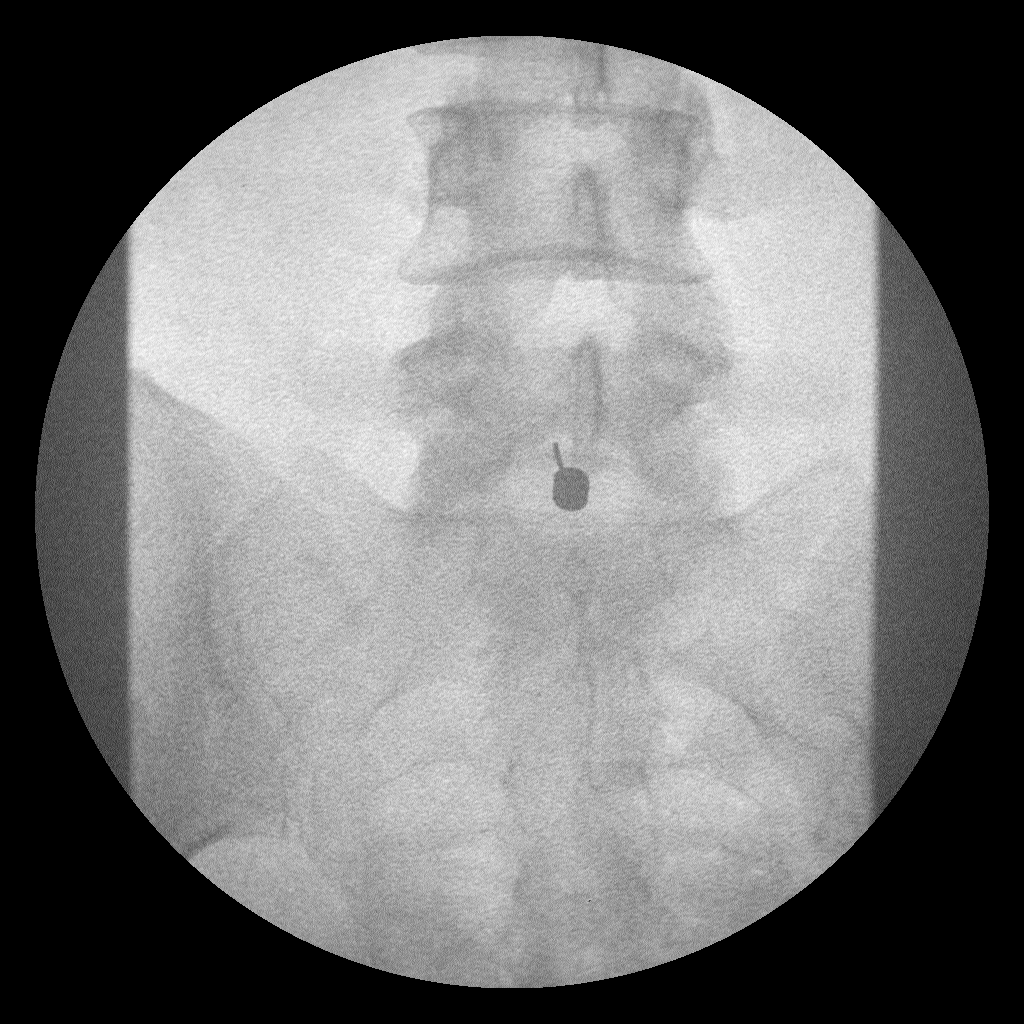
[im 2/3]
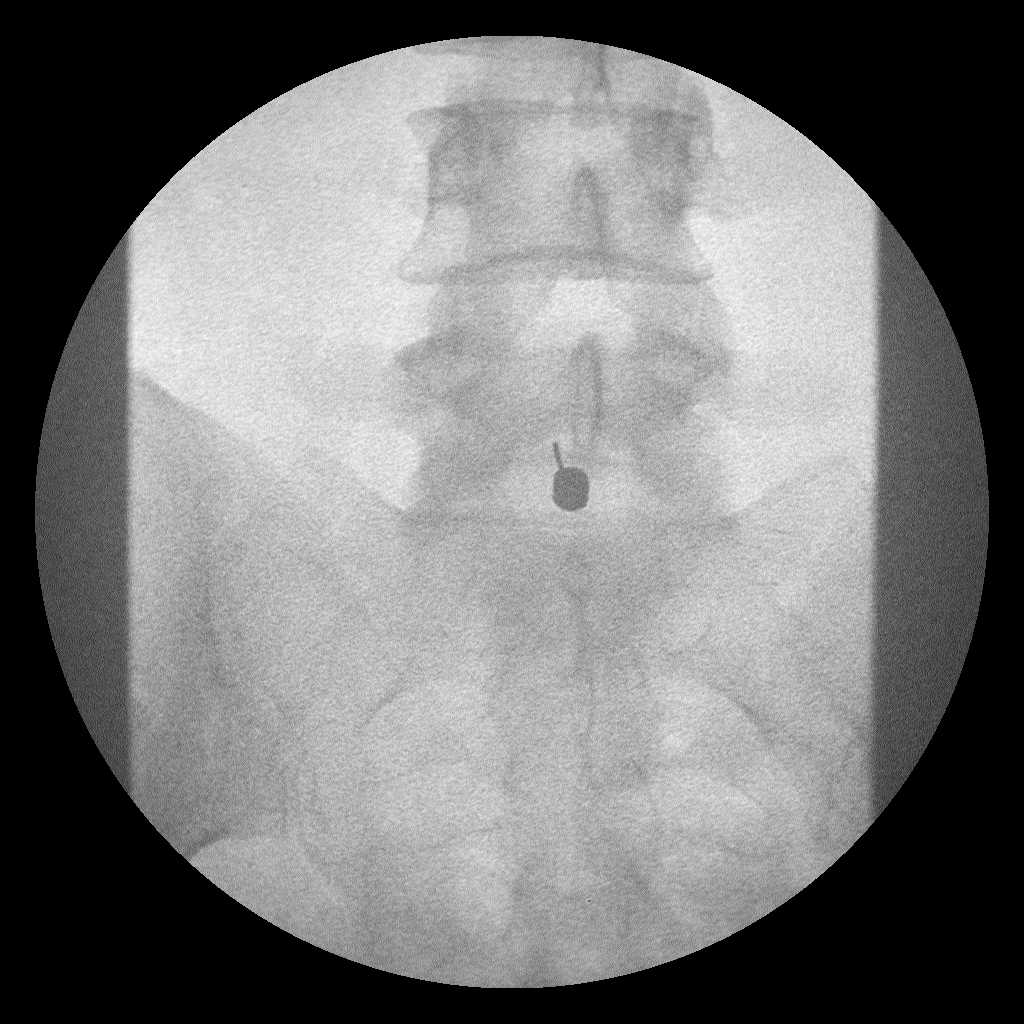
[im 3/3]
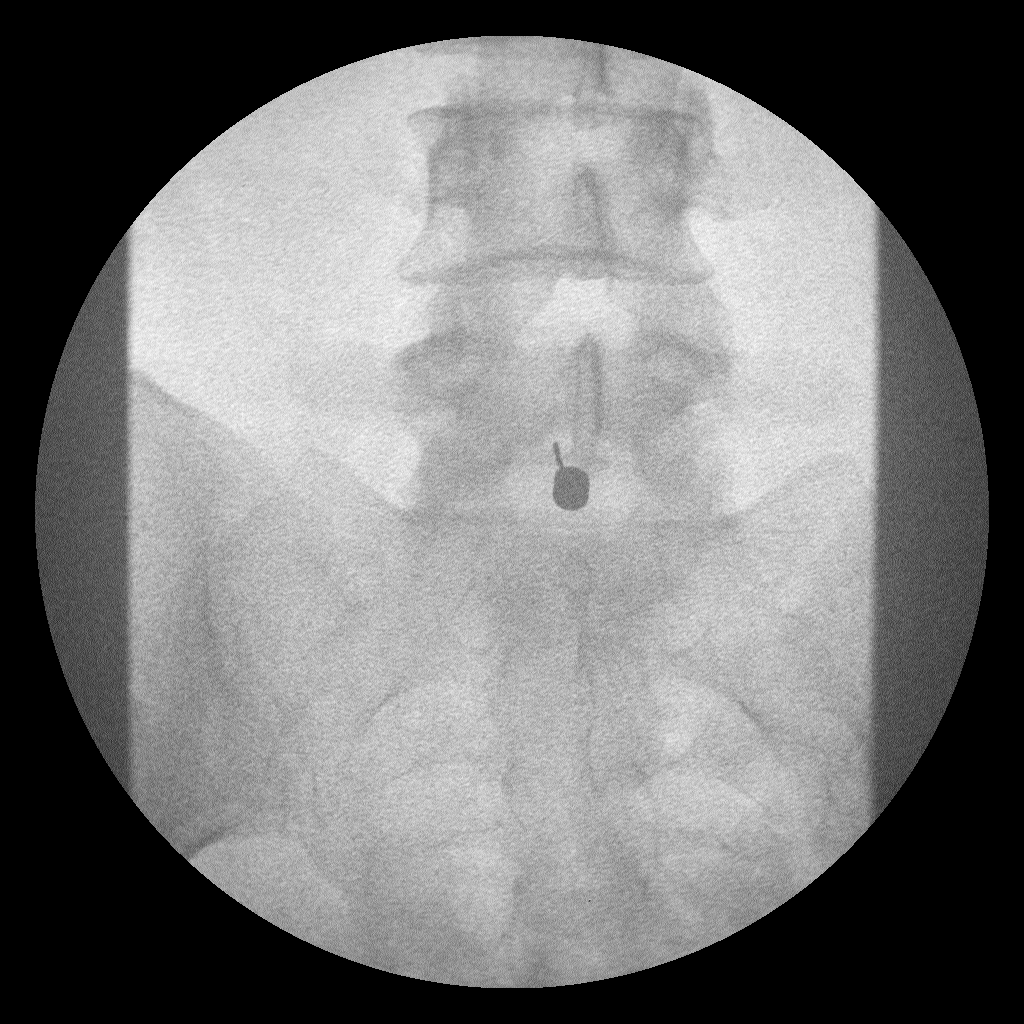

[3 of 3 positions shown; findings below may reference images not displayed]

FINDINGS: Informed consent was obtained.  Time-out procedure was
   performed.  I sterilely prepped the lower lumbar region with
Betadine and x3.  Local anesthesia with 1% subcutaneous lidocaine.
A 20 gauge spinal needle was initially inserted at the L 4-5 level
 however no CSF could be obtained at that site.  Next, the needle
was inserted into the subarachnoid space the L5-S1 level.  The CSF
 was clear and colorless.  I obtained approximate 6 ml of CSF for
                   requested laboratory studies.
IMPRESSION: Successful diagnostic lumbar puncture.

The patient tolerated the procedure well without immediate
complications.

## 2011-06-04 ENCOUNTER — Other Ambulatory Visit: Payer: Self-pay | Admitting: Internal Medicine

## 2011-06-06 ENCOUNTER — Encounter: Payer: Self-pay | Admitting: Internal Medicine

## 2011-06-06 ENCOUNTER — Telehealth: Payer: Self-pay | Admitting: *Deleted

## 2011-06-06 ENCOUNTER — Ambulatory Visit (INDEPENDENT_AMBULATORY_CARE_PROVIDER_SITE_OTHER): Payer: PRIVATE HEALTH INSURANCE | Admitting: Internal Medicine

## 2011-06-06 VITALS — BP 110/70 | HR 77 | Temp 98.3°F | Ht 62.0 in | Wt 114.1 lb

## 2011-06-06 DIAGNOSIS — J209 Acute bronchitis, unspecified: Secondary | ICD-10-CM | POA: Insufficient documentation

## 2011-06-06 DIAGNOSIS — J019 Acute sinusitis, unspecified: Secondary | ICD-10-CM | POA: Insufficient documentation

## 2011-06-06 DIAGNOSIS — R062 Wheezing: Secondary | ICD-10-CM | POA: Insufficient documentation

## 2011-06-06 MED ORDER — LEVOFLOXACIN 250 MG PO TABS: 250.0000 mg | ORAL_TABLET | Freq: Every day | ORAL | Status: DC

## 2011-06-06 MED ORDER — PREDNISONE 10 MG PO TABS: 10.0000 mg | ORAL_TABLET | Freq: Every day | ORAL | Status: DC

## 2011-06-06 MED ORDER — HYDROCODONE-HOMATROPINE 5-1.5 MG/5ML PO SYRP: 5.0000 mL | ORAL_SOLUTION | Freq: Four times a day (QID) | ORAL | Status: AC | PRN

## 2011-06-06 NOTE — Assessment & Plan Note (Signed)
Mild to mod, for antibx course,  to f/u any worsening symptoms or concerns 

## 2011-06-06 NOTE — Telephone Encounter (Signed)
Patient called c/o cough & congestion. Scheduled OV 04.16.13 at 4:00pm w/Dr. Jonny Ruiz; per patient request/SLS

## 2011-06-06 NOTE — Patient Instructions (Signed)
Take all new medications as prescribed Continue all other medications as before You are given the work note  

## 2011-06-06 NOTE — Assessment & Plan Note (Signed)
Mild to mod, for predpack,  to f/u any worsening symptoms or concerns 

## 2011-06-06 NOTE — Progress Notes (Signed)
Subjective:    Patient ID: Jordan [name], female    DOB: 1961-01-20, 51 y.o.   MRN: [id number]  HPI   Here with 3 days acute onset fever, facial pain, pressure, general weakness and malaise, and greenish d/c, and Here with acute onset mild to mod 2-3 days ST, HA,  with prod cough greenish sputum, as well as midl wheezing onset this last evening.  Pt denies chest pain,  orthopnea, PND, increased LE swelling, palpitations, dizziness or syncope.  Pt denies new neurological symptoms such as new headache, or facial or extremity weakness or numbness   Pt denies polydipsia, polyuria.   Past Medical History  Diagnosis Date  . Headache   . Depressive disorder, not elsewhere classified   . Anemia, unspecified   . History of bulimia   . History of anorexia nervosa   . Meningitis, unspecified   . Other specified glaucoma   . Allergic rhinitis due to pollen   . Acute upper respiratory infections of unspecified site   . Diffuse cystic mastopathy   . Lump or mass in breast   . Perforation of tympanic membrane, unspecified   . Anal fissure   . Anxiety disorder   . Fibromyalgia   . IBS (irritable bowel syndrome)   . Seizures   . Radial styloid tenosynovitis   . 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 Dilation and curettage of uterus     History of Menometrorrhagia    reports that she has never smoked. She has never used smokeless tobacco. She reports that she drinks alcohol. She reports that she does not use illicit drugs. family history includes Breast cancer in her maternal grandmother; Coronary artery disease in her mother; Diabetes in her father; Heart disease in her mother; Heart failure in her father; Hyperlipidemia in her father; Hypertension in her father and mother; and Ovarian cancer in her mother.  There is no history of Colon cancer. No Known Allergies Current Outpatient Prescriptions on File Prior to Visit  Medication Sig Dispense Refill  . ALPRAZolam  (XANAX) 0.5 MG tablet Take 0.5 mg by mouth every 6 (six) hours as needed.        . Armodafinil (NUVIGIL) 250 MG tablet Take 250 mg by mouth daily.        Marland Kitchen buPROPion (WELLBUTRIN XL) 300 MG 24 hr tablet Take 300 mg by mouth daily.        . DULoxetine (CYMBALTA) 60 MG capsule Take 60 mg by mouth daily.        . Multiple Vitamins-Minerals (MULTIVITAMIN,TX-MINERALS) tablet Take 1 tablet by mouth daily.        . naproxen (NAPROSYN) 500 MG tablet Take 1 tablet (500 mg total) by mouth 2 (two) times daily as needed.  180 tablet  2  . promethazine (PHENERGAN) 12.5 MG tablet Take 12.5 mg by mouth every 6 (six) hours as needed.        . ranitidine (RANITIDINE 75) 75 MG tablet Take 1 tablet (75 mg total) by mouth 2 (two) times daily. Take with naproxen  180 tablet  3  . TRAZODONE HCL PO Take 3 each by mouth at bedtime. Patient is unsure of [mg] taking        Review of Systems Review of Systems  Constitutional: Negative for diaphoresis and unexpected weight change.  Eyes: Negative for photophobia and visual disturbance.  Respiratory: Negative for choking and stridor.   Gastrointestinal: Negative for vomiting and blood in  stool.  Genitourinary: Negative for hematuria and decreased urine volume.   Neurological: Negative for tremors and numbness.  Psychiatric/Behavioral: Negative for decreased concentration. The patient is not hyperactive.       Objective:   Physical Exam BP 110/70  Pulse 77  Temp(Src) 98.3 F (36.8 C) (Oral)  Ht 5\' 2"  (1.575 m)  Wt 114 lb 2 oz (51.767 kg)  BMI 20.87 kg/m2  SpO2 97% Physical Exam  VS noted Constitutional: Pt appears well-developed and well-nourished.  HENT: Head: Normocephalic.  Right Ear: External ear normal.  Left Ear: External ear normal.  Bilat tm's mild erythema.  Sinus tender bilat.  Pharynx mild erythema Eyes: Conjunctivae and EOM are normal. Pupils are equal, round, and reactive to light.  Neck: Normal range of motion. Neck supple.  Cardiovascular:  Normal rate and regular rhythm.   Pulmonary/Chest: Effort normal and breath sounds decr bilat with mild wheezes Neurological: Pt is alert.motor intact Skin: Skin is warm. No erythema.  Psychiatric: Pt behavior is normal. Thought content normal. 1+ nervous    Assessment & Plan:

## 2011-06-08 ENCOUNTER — Telehealth: Payer: Self-pay | Admitting: *Deleted

## 2011-06-08 NOTE — Telephone Encounter (Signed)
Overall should still improve, but could use some mucinex bid otc as well to help any congestion difficult to "get up"

## 2011-06-08 NOTE — Telephone Encounter (Signed)
Pt seen on 04.16.13-reports that coughing has calmed down during day, but c/o chest pain at night when starts again, weakness & body aches; would like to know if there's any other advice that can be given. Please advise.

## 2011-06-09 ENCOUNTER — Ambulatory Visit: Payer: Self-pay | Admitting: Internal Medicine

## 2011-06-09 NOTE — Telephone Encounter (Signed)
Called pt no answer LMOM RTC... 06/09/11@9 :10am/LMB

## 2011-06-09 NOTE — Telephone Encounter (Signed)
Patient notified per MD.

## 2011-06-10 ENCOUNTER — Ambulatory Visit: Payer: Self-pay | Admitting: Family Medicine

## 2011-06-10 ENCOUNTER — Ambulatory Visit (INDEPENDENT_AMBULATORY_CARE_PROVIDER_SITE_OTHER): Payer: PRIVATE HEALTH INSURANCE | Admitting: Family Medicine

## 2011-06-10 ENCOUNTER — Encounter: Payer: Self-pay | Admitting: Family Medicine

## 2011-06-10 VITALS — BP 120/80 | Temp 98.0°F | Wt 111.0 lb

## 2011-06-10 DIAGNOSIS — J209 Acute bronchitis, unspecified: Secondary | ICD-10-CM

## 2011-06-10 DIAGNOSIS — J329 Chronic sinusitis, unspecified: Secondary | ICD-10-CM

## 2011-06-10 DIAGNOSIS — J309 Allergic rhinitis, unspecified: Secondary | ICD-10-CM

## 2011-06-10 MED ORDER — FLUTICASONE PROPIONATE 50 MCG/ACT NA SUSP: 2.0000 | Freq: Every day | NASAL | Status: DC

## 2011-06-10 MED ORDER — ALBUTEROL SULFATE HFA 108 (90 BASE) MCG/ACT IN AERS
2.0000 | INHALATION_SPRAY | Freq: Four times a day (QID) | RESPIRATORY_TRACT | Status: DC | PRN
Start: 2011-06-10 — End: 2011-06-14

## 2011-06-10 NOTE — Patient Instructions (Signed)
Restart your allegra.  Start the nasal spray. Continue your antibiotic and make sure to complete all of it. Start using the inhaler as needed for shortness of breath.

## 2011-06-10 NOTE — Progress Notes (Signed)
  Subjective:    Patient ID: Jordan [name], female    DOB: Oct 27, 1960, 51 y.o.   MRN: [id number]  HPI Dx with bronchitis/sinusitis about 4 days ago. She is taking her levaquin, prednisone, cough med. Says she still feels dizzy and still with cough but does feel a lot better but does feel worse with acitivity.  Hx of allergies. Normally take allegra but hasn't taken in weeks.  No other allergy meds right now.  Cough is stiil productive. Chest feels tight today. No hx of asthma.    Review of Systems     Objective:   Physical Exam  Constitutional: She is oriented to person, place, and time. She appears well-developed and well-nourished.  HENT:  Head: Normocephalic and atraumatic.  Right Ear: External ear normal.  Left Ear: External ear normal.  Nose: Nose normal.  Mouth/Throat: Oropharynx is clear and moist.       TMs and canals are clear. Sig scarring on right TM   Eyes: Conjunctivae and EOM are normal. Pupils are equal, round, and reactive to light.  Neck: Neck supple. No thyromegaly present.  Cardiovascular: Normal rate, regular rhythm and normal heart sounds.   Pulmonary/Chest: Effort normal and breath sounds normal. She has no wheezes.  Musculoskeletal: She exhibits no edema.  Lymphadenopathy:    She has no cervical adenopathy.  Neurological: She is alert and oriented to person, place, and time.  Skin: Skin is warm and dry.  Psychiatric: She has a normal mood and affect.          Assessment & Plan:  Bronchitis/sinusitis - Given neb in the office bc of chest tightness today. She did well with xopenex neb.  Will rx an inhaler for her bronchitis.  Continue levaquin as she is feeling some better. She completed her steroids yesterday. Also important to tx her allergies to gte hre improving more quickly.   AR- will have her restart her Allegra.  Will call in flonase which she has used before for allergies. Call if not better in 1 week of if getting worse call sooner.

## 2011-06-14 ENCOUNTER — Emergency Department (HOSPITAL_COMMUNITY)
Admission: EM | Admit: 2011-06-14 | Discharge: 2011-06-14 | Disposition: A | Discharge status: Home / Self Care | Payer: PRIVATE HEALTH INSURANCE | Source: Home / Self Care | Attending: Emergency Medicine | Admitting: Emergency Medicine

## 2011-06-14 ENCOUNTER — Encounter (HOSPITAL_COMMUNITY): Payer: Self-pay | Admitting: *Deleted

## 2011-06-14 ENCOUNTER — Emergency Department (INDEPENDENT_AMBULATORY_CARE_PROVIDER_SITE_OTHER): Payer: PRIVATE HEALTH INSURANCE

## 2011-06-14 ENCOUNTER — Telehealth: Payer: Self-pay | Admitting: Internal Medicine

## 2011-06-14 DIAGNOSIS — J45909 Unspecified asthma, uncomplicated: Secondary | ICD-10-CM

## 2011-06-14 DIAGNOSIS — J45901 Unspecified asthma with (acute) exacerbation: Secondary | ICD-10-CM

## 2011-06-14 MED ORDER — IPRATROPIUM BROMIDE 0.02 % IN SOLN
0.5000 mg | Freq: Once | RESPIRATORY_TRACT | Status: AC
  Administered 2011-06-14: 0.5 mg via RESPIRATORY_TRACT

## 2011-06-14 MED ORDER — PREDNISONE 20 MG PO TABS
60.0000 mg | ORAL_TABLET | Freq: Once | ORAL | Status: AC
  Administered 2011-06-14: 60 mg via ORAL

## 2011-06-14 MED ORDER — ALBUTEROL SULFATE HFA 108 (90 BASE) MCG/ACT IN AERS: 1.0000 | INHALATION_SPRAY | Freq: Four times a day (QID) | RESPIRATORY_TRACT | Status: DC | PRN

## 2011-06-14 MED ORDER — ALBUTEROL SULFATE (5 MG/ML) 0.5% IN NEBU
INHALATION_SOLUTION | RESPIRATORY_TRACT | Status: AC
  Filled 2011-06-14: qty 1

## 2011-06-14 MED ORDER — PREDNISONE 20 MG PO TABS
ORAL_TABLET | ORAL | Status: AC
  Filled 2011-06-14: qty 3

## 2011-06-14 MED ORDER — ALBUTEROL SULFATE (5 MG/ML) 0.5% IN NEBU
5.0000 mg | INHALATION_SOLUTION | Freq: Once | RESPIRATORY_TRACT | Status: AC
  Administered 2011-06-14: 5 mg via RESPIRATORY_TRACT

## 2011-06-14 MED ORDER — DEXAMETHASONE 4 MG PO TABS: ORAL_TABLET | ORAL | Status: DC

## 2011-06-14 MED ORDER — PREDNISONE 20 MG PO TABS: 20.0000 mg | ORAL_TABLET | Freq: Every day | ORAL | Status: DC

## 2011-06-14 NOTE — Discharge Instructions (Signed)
Take two puffs from your albuterol inhaler every 4 hours. Finish the steroids unless your doctor tells you to stop. Do a peak flow, once the morning and once at night. Write this down. The number should be going up, not down. You may decrease the frequency of your albuterol inhaler as the numbers go up and you start feeling better. You may start the steroids tomorrow, as you have already taken today's dose. You may take tylenol 1 gram up to 4 times a day as needed for pain. This with 600 mg of motrin is an effective combination for pain and fever. Make sure you drink extra fluids. Return if you get worse, have a fever >100.4, or any other concerns.  ° °Go to www.goodrx.com to look up your medications. This will give you a list of where you can find your prescriptions at the most affordable prices.   °

## 2011-06-14 NOTE — ED Notes (Signed)
pEAK  FLO  POST  [place]  250

## 2011-06-14 NOTE — Telephone Encounter (Signed)
Got message late. Called - left msg on patient's phone - will work in first thing in the AM. If she has distress she does need to go to Urgent Care tonight.

## 2011-06-14 NOTE — Telephone Encounter (Signed)
The pt called and is hoping to get an appointment for a breathing treatment today.  She states her bronchitis is not better.  Please advise if you want her added on to your afternoon.  Thanks!

## 2011-06-14 NOTE — ED Provider Notes (Signed)
History     CSN: [id number]  Arrival date & time 06/14/11  1608   First MD Initiated Contact with Patient 06/14/11 1626      Chief Complaint  Patient presents with  . Cough    (Consider location/radiation/quality/duration/timing/severity/associated sxs/prior treatment) HPI Comments: Patient reports 3 weeks of coughing, wheezing, shortness of breath, dyspnea on exertion. Symptoms initially started with nasal congestion, URI like symptoms. Patient was seen by her primary care physician on 4/16, thought to have sinusitis, bronchitis, placed on Levaquin for 10 days, and a five-day prednisone Dosepak. Patient states she finished both of steroids and Levaquin. She was advised to start Mucinex on 4/18. Repeat visit on 4/20 with continued chest tightness, given Xopenex with some relief. Was started on an inhaler for bronchitis. Patient states that she is using this every 4-6 hours with mild relief. States that she is not sure she is using it correctly. Patient is also noted to have seasonal allergies, was restarted on Allegra, Flonase called in.  Patient presents today because she states she is not getting any better. Reports continued nonproductive cough, chest tightness, wheezing, dyspnea on exertion. Has achy chest pain after coughing. Reports fatigue, decreased appetite, unintentional 11 pound weight loss over the past 3 weeks. Reports nausea, no vomiting. No orthopnea, nocturia, lower extremity swelling.  ROS as noted in HPI. All other ROS negative.   Patient is a 51 y.o. female presenting with shortness of breath. The history is provided by the patient. No language interpreter was used.  Shortness of Breath  The current episode started more than 2 weeks ago. The problem has been gradually worsening. The problem is mild. The symptoms are relieved by nothing. The symptoms are aggravated by activity. Associated symptoms include cough, shortness of breath and wheezing. Pertinent negatives include  no chest pressure, no orthopnea, no fever and no stridor. She has not inhaled smoke recently. Her past medical history does not include asthma. Recently, medical care has been given by the PCP.    Past Medical History  Diagnosis Date  . Headache   . Depressive disorder, not elsewhere classified   . Anemia, unspecified   . History of bulimia   . History of anorexia nervosa   . Meningitis, unspecified   . Other specified glaucoma   . Allergic rhinitis due to pollen   . Acute upper respiratory infections of unspecified site   . Diffuse cystic mastopathy   . Lump or mass in breast   . Perforation of tympanic membrane, unspecified   . Anal fissure   . Anxiety disorder   . Fibromyalgia   . IBS (irritable bowel syndrome)   . Seizures   . Radial styloid tenosynovitis   . 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 Dilation and curettage of uterus     History of Menometrorrhagia    Family History  Problem Relation Age of Onset  . Ovarian cancer Mother   . Heart disease Mother   . Hypertension Mother   . Coronary artery disease Mother   . Heart failure Father   . Hypertension Father   . Hyperlipidemia Father   . Diabetes Father   . Breast cancer Maternal Grandmother   . Colon cancer Neg Hx     History  Substance Use Topics  . Smoking status: Never Smoker   . Smokeless tobacco: Never Used  . Alcohol Use: Yes     2-4 / Day    OB  History    Grav Para Term Preterm Abortions TAB SAB Ect Mult Living                  Review of Systems  Constitutional: Negative for fever.  Respiratory: Positive for cough, shortness of breath and wheezing. Negative for stridor.   Cardiovascular: Negative for orthopnea.    Allergies  Review of patient's allergies indicates no known allergies.  Home Medications   Current Outpatient Rx  Name Route Sig Dispense Refill  . ALBUTEROL SULFATE HFA 108 (90 BASE) MCG/ACT IN AERS Inhalation Inhale 1-2 puffs into the  lungs every 6 (six) hours as needed for wheezing or shortness of breath (dispense with aerochamber. ). Please show pt proper use of inhaler 1 Inhaler 0  . ALPRAZOLAM 0.5 MG PO TABS Oral Take 0.5 mg by mouth every 6 (six) hours as needed.      . ARMODAFINIL 250 MG PO TABS Oral Take 250 mg by mouth daily.      . BUPROPION HCL ER (XL) 300 MG PO TB24 Oral Take 300 mg by mouth daily.      Marland Kitchen DEXAMETHASONE 4 MG PO TABS  4 tabs po at once on day one, 4 tabs po at once on day 2 8 tablet 0  . DULOXETINE HCL 60 MG PO CPEP Oral Take 60 mg by mouth daily.      Marland Kitchen FLUTICASONE PROPIONATE 50 MCG/ACT NA SUSP Nasal Place 2 sprays into the nose daily. 16 g 6  . HYDROCODONE-HOMATROPINE 5-1.5 MG/5ML PO SYRP Oral Take 5 mLs by mouth every 6 (six) hours as needed for cough. 120 mL 1  . SUPER HIGH VITAMINS/MINERALS PO TABS Oral Take 1 tablet by mouth daily.      Marland Kitchen NAPROXEN 500 MG PO TABS Oral Take 1 tablet (500 mg total) by mouth 2 (two) times daily as needed. 180 tablet 2  . PROMETHAZINE HCL 12.5 MG PO TABS Oral Take 12.5 mg by mouth every 6 (six) hours as needed.      Marland Kitchen RANITIDINE HCL 75 MG PO TABS Oral Take 1 tablet (75 mg total) by mouth 2 (two) times daily. Take with naproxen 180 tablet 3  . TRAZODONE HCL PO Oral Take 3 each by mouth at bedtime. Patient is unsure of [mg] taking       BP 143/81  Pulse 96  Temp(Src) 99.6 F (37.6 C) (Oral)  Resp 18  SpO2 97%  Physical Exam  Nursing note and vitals reviewed. Constitutional: She is oriented to person, place, and time. She appears well-developed and well-nourished. No distress.  HENT:  Head: Normocephalic and atraumatic.  Eyes: Conjunctivae and EOM are normal.  Neck: Normal range of motion.  Cardiovascular: Normal rate.   Pulmonary/Chest: Effort normal. No respiratory distress. She has no wheezes. She has no rales. She exhibits tenderness.       dyspnic when talking. Poor air movement.   Abdominal: She exhibits no distension.  Musculoskeletal: Normal range  of motion.  Neurological: She is alert and oriented to person, place, and time.  Skin: Skin is warm and dry.  Psychiatric: She has a normal mood and affect. Her behavior is normal. Judgment and thought content normal.    ED Course  Procedures (including critical care time)  Labs Reviewed - No data to display Dg Chest 2 View  06/14/2011  *RADIOLOGY REPORT*  Clinical Data: Cough.  CHEST - 2 VIEW  Comparison: Two-view chest 04/07/2010.  Findings: The heart size is normal.  The  lungs are clear.  The visualized soft tissues and bony thorax are unremarkable.  IMPRESSION: Negative chest.  Original Report Authenticated By: Jamesetta Orleans. MATTERN, M.D.     1. Bronchitis with asthma, acute       MDM  Pt seen and examined. Pt has poor air movement, no wheezing. Patient dyspneic when talking. Speaking in short sentences only. Pt given albuterol/atrovent, predinsone.  Peak flow done. pretx 150. Calculated peak flow 460. Will do CXR to rule out pneumonia, PTX, mass as patient has had symptoms for 3 weeks, and has not improved with course of Levaquin.   Posttreatment #1:220/250/270   Reevaluation, patient states she feels slightly better. No change in physical exam. Will give one more DuoNeb, and reevaluate.   Imaging reviewed by myself. Hyperinflation. No pNA.Report per radiologist.  After tx #2: 230/290/290. Pt states she feels much better. Improved air movement on exam. No wheezing. States she feels okay enough to go home. Sending home with 2 days of dexamethasone, and AeroChamber to help her use her albuterol inhaler correctly, and sending her home with her peak flow meter. Educated her on how to use this. She is to keep a record of her peak flows. She is to go to the ER if her peak flows are not improving, or getting worse with current treatment. She'll followup with Dr. Debby Bud tomorrow. We'll CC him regarding today's visit.   Luiz Blare, MD 06/14/11 2308

## 2011-06-14 NOTE — ED Notes (Signed)
Pt  Seen  X  2  Recently  For  Bronchitis     Took  2  Rounds        Of  Anti  Biotics  As  Well  As  Some  Prednisone  And    Inhalers     -  She  Is  Somewhat  Tight  And  congested  She  Is  Sitting  Upright on  Exam table

## 2011-06-14 NOTE — ED Notes (Signed)
Peak flo  Prior  To  tx  150

## 2011-06-15 ENCOUNTER — Encounter (HOSPITAL_COMMUNITY): Payer: Self-pay | Admitting: *Deleted

## 2011-06-15 ENCOUNTER — Encounter: Payer: Self-pay | Admitting: Internal Medicine

## 2011-06-15 ENCOUNTER — Inpatient Hospital Stay (HOSPITAL_COMMUNITY)
Admission: AD | Admit: 2011-06-15 | Discharge: 2011-06-19 | DRG: 202 | Disposition: A | Discharge status: Home / Self Care | Payer: PRIVATE HEALTH INSURANCE | Source: Ambulatory Visit | Attending: Internal Medicine | Admitting: Internal Medicine

## 2011-06-15 ENCOUNTER — Ambulatory Visit (INDEPENDENT_AMBULATORY_CARE_PROVIDER_SITE_OTHER): Payer: PRIVATE HEALTH INSURANCE | Admitting: Internal Medicine

## 2011-06-15 VITALS — BP 122/80 | HR 81 | Temp 98.8°F | Resp 15 | Wt 113.0 lb

## 2011-06-15 DIAGNOSIS — F3289 Other specified depressive episodes: Secondary | ICD-10-CM | POA: Diagnosis present

## 2011-06-15 DIAGNOSIS — F329 Major depressive disorder, single episode, unspecified: Secondary | ICD-10-CM | POA: Diagnosis present

## 2011-06-15 DIAGNOSIS — J9801 Acute bronchospasm: Secondary | ICD-10-CM

## 2011-06-15 DIAGNOSIS — R0602 Shortness of breath: Secondary | ICD-10-CM | POA: Diagnosis present

## 2011-06-15 DIAGNOSIS — IMO0001 Reserved for inherently not codable concepts without codable children: Secondary | ICD-10-CM | POA: Diagnosis present

## 2011-06-15 DIAGNOSIS — J209 Acute bronchitis, unspecified: Secondary | ICD-10-CM

## 2011-06-15 DIAGNOSIS — E46 Unspecified protein-calorie malnutrition: Secondary | ICD-10-CM | POA: Diagnosis present

## 2011-06-15 DIAGNOSIS — F411 Generalized anxiety disorder: Secondary | ICD-10-CM | POA: Diagnosis present

## 2011-06-15 DIAGNOSIS — J40 Bronchitis, not specified as acute or chronic: Principal | ICD-10-CM | POA: Diagnosis present

## 2011-06-15 DIAGNOSIS — R062 Wheezing: Secondary | ICD-10-CM

## 2011-06-15 HISTORY — DX: Other complications of anesthesia, initial encounter: T88.59XA

## 2011-06-15 HISTORY — DX: Adverse effect of unspecified anesthetic, initial encounter: T41.45XA

## 2011-06-15 LAB — CBC
HCT: 34.9 % — ABNORMAL LOW (ref 36.0–46.0)
Hemoglobin: 12.2 g/dL (ref 12.0–15.0)
MCH: 32.2 pg (ref 26.0–34.0)
MCHC: 35 g/dL (ref 30.0–36.0)
MCV: 92.1 fL (ref 78.0–100.0)
Platelets: 214 10*3/uL (ref 150–400)
RBC: 3.79 MIL/uL — ABNORMAL LOW (ref 3.87–5.11)
RDW: 12.9 % (ref 11.5–15.5)
WBC: 8 10*3/uL (ref 4.0–10.5)

## 2011-06-15 LAB — BASIC METABOLIC PANEL
BUN: 11 mg/dL (ref 6–23)
CO2: 23 mEq/L (ref 19–32)
Calcium: 8.9 mg/dL (ref 8.4–10.5)
Chloride: 104 mEq/L (ref 96–112)
Creatinine, Ser: 0.81 mg/dL (ref 0.50–1.10)
GFR calc Af Amer: >90 mL/min (ref >90)
GFR calc non Af Amer: 83 mL/min — ABNORMAL LOW (ref >90)
Glucose, Bld: 83 mg/dL (ref 70–99)
Potassium: 3.6 mEq/L (ref 3.5–5.1)
Sodium: 138 mEq/L (ref 135–145)

## 2011-06-15 LAB — DIFFERENTIAL
Basophils Absolute: 0 10*3/uL (ref 0.0–0.1)
Basophils Relative: 0 % (ref 0–1)
Eosinophils Absolute: 0.3 10*3/uL (ref 0.0–0.7)
Eosinophils Relative: 3 % (ref 0–5)
Lymphocytes Relative: 28 % (ref 12–46)
Lymphs Abs: 2.2 10*3/uL (ref 0.7–4.0)
Monocytes Absolute: 0.7 10*3/uL (ref 0.1–1.0)
Monocytes Relative: 9 % (ref 3–12)
Neutro Abs: 4.7 10*3/uL (ref 1.7–7.7)
Neutrophils Relative %: 59 % (ref 43–77)

## 2011-06-15 MED ORDER — ACETAMINOPHEN 650 MG RE SUPP: 650.0000 mg | Freq: Four times a day (QID) | RECTAL | Status: DC | PRN

## 2011-06-15 MED ORDER — ZOLPIDEM TARTRATE 5 MG PO TABS
5.0000 mg | ORAL_TABLET | Freq: Every evening | ORAL | Status: DC | PRN
  Administered 2011-06-15 – 2011-06-16 (×2): 5 mg via ORAL
  Filled 2011-06-15 (×2): qty 1

## 2011-06-15 MED ORDER — ALPRAZOLAM 0.5 MG PO TABS
0.5000 mg | ORAL_TABLET | Freq: Four times a day (QID) | ORAL | Status: DC | PRN
  Administered 2011-06-16 – 2011-06-18 (×4): 0.5 mg via ORAL
  Filled 2011-06-15 (×4): qty 1

## 2011-06-15 MED ORDER — PROMETHAZINE HCL 25 MG PO TABS
12.5000 mg | ORAL_TABLET | Freq: Four times a day (QID) | ORAL | Status: DC | PRN
  Administered 2011-06-16: 12.5 mg via ORAL
  Filled 2011-06-15: qty 1

## 2011-06-15 MED ORDER — ALBUTEROL SULFATE HFA 108 (90 BASE) MCG/ACT IN AERS
1.0000 | INHALATION_SPRAY | Freq: Four times a day (QID) | RESPIRATORY_TRACT | Status: DC | PRN
  Filled 2011-06-15: qty 6.7

## 2011-06-15 MED ORDER — GUAIFENESIN ER 600 MG PO TB12
600.0000 mg | ORAL_TABLET | Freq: Two times a day (BID) | ORAL | Status: DC
  Administered 2011-06-15 – 2011-06-19 (×8): 600 mg via ORAL
  Filled 2011-06-15 (×9): qty 1

## 2011-06-15 MED ORDER — TRAZODONE HCL 100 MG PO TABS
100.0000 mg | ORAL_TABLET | Freq: Every day | ORAL | Status: DC
  Administered 2011-06-15 – 2011-06-18 (×4): 100 mg via ORAL
  Filled 2011-06-15 (×5): qty 1

## 2011-06-15 MED ORDER — PROMETHAZINE-CODEINE 6.25-10 MG/5ML PO SYRP: 5.0000 mL | ORAL_SOLUTION | Freq: Four times a day (QID) | ORAL | Status: DC | PRN

## 2011-06-15 MED ORDER — ACETAMINOPHEN 325 MG PO TABS
650.0000 mg | ORAL_TABLET | Freq: Four times a day (QID) | ORAL | Status: DC | PRN
  Administered 2011-06-17 – 2011-06-18 (×3): 650 mg via ORAL
  Filled 2011-06-15 (×4): qty 2

## 2011-06-15 MED ORDER — BUPROPION HCL ER (XL) 300 MG PO TB24
300.0000 mg | ORAL_TABLET | Freq: Every day | ORAL | Status: DC
  Administered 2011-06-16 – 2011-06-19 (×4): 300 mg via ORAL
  Filled 2011-06-15 (×4): qty 1

## 2011-06-15 MED ORDER — BENZONATATE 100 MG PO CAPS
100.0000 mg | ORAL_CAPSULE | Freq: Three times a day (TID) | ORAL | Status: DC
  Administered 2011-06-15 – 2011-06-19 (×11): 100 mg via ORAL
  Filled 2011-06-15 (×13): qty 1

## 2011-06-15 MED ORDER — DEXTROSE-NACL 5-0.45 % IV SOLN
INTRAVENOUS | Status: DC
  Administered 2011-06-15 – 2011-06-17 (×2): via INTRAVENOUS

## 2011-06-15 MED ORDER — PANTOPRAZOLE SODIUM 40 MG PO TBEC
40.0000 mg | DELAYED_RELEASE_TABLET | Freq: Every day | ORAL | Status: DC
  Administered 2011-06-16 – 2011-06-19 (×4): 40 mg via ORAL
  Filled 2011-06-15 (×4): qty 1

## 2011-06-15 MED ORDER — NAPROXEN 500 MG PO TABS
500.0000 mg | ORAL_TABLET | Freq: Two times a day (BID) | ORAL | Status: DC | PRN
  Administered 2011-06-15 – 2011-06-16 (×3): 500 mg via ORAL
  Filled 2011-06-15 (×3): qty 1

## 2011-06-15 MED ORDER — DULOXETINE HCL 60 MG PO CPEP
60.0000 mg | ORAL_CAPSULE | Freq: Every day | ORAL | Status: DC
  Administered 2011-06-16 – 2011-06-19 (×4): 60 mg via ORAL
  Filled 2011-06-15 (×4): qty 1

## 2011-06-15 MED ORDER — METHYLPREDNISOLONE SODIUM SUCC 125 MG IJ SOLR
80.0000 mg | Freq: Three times a day (TID) | INTRAMUSCULAR | Status: DC
  Administered 2011-06-15 – 2011-06-17 (×5): 80 mg via INTRAVENOUS
  Filled 2011-06-15 (×8): qty 1.28

## 2011-06-15 MED ORDER — FLUTICASONE PROPIONATE 50 MCG/ACT NA SUSP
1.0000 | Freq: Every day | NASAL | Status: DC
  Administered 2011-06-16 – 2011-06-19 (×4): 1 via NASAL
  Filled 2011-06-15: qty 16

## 2011-06-15 MED ORDER — SENNA 8.6 MG PO TABS
1.0000 | ORAL_TABLET | Freq: Two times a day (BID) | ORAL | Status: DC
  Administered 2011-06-15 – 2011-06-18 (×6): 8.6 mg via ORAL
  Filled 2011-06-15 (×7): qty 1

## 2011-06-15 MED ORDER — ARMODAFINIL 250 MG PO TABS: 250.0000 mg | ORAL_TABLET | Freq: Every day | ORAL | Status: DC

## 2011-06-15 MED ORDER — ENOXAPARIN SODIUM 40 MG/0.4ML ~~LOC~~ SOLN
40.0000 mg | SUBCUTANEOUS | Status: DC
  Administered 2011-06-15 – 2011-06-18 (×4): 40 mg via SUBCUTANEOUS
  Filled 2011-06-15 (×5): qty 0.4

## 2011-06-15 NOTE — Telephone Encounter (Signed)
Your 3:15pm just canceled, so I moved her into that slot.  She is aware.

## 2011-06-15 NOTE — H&P (Signed)
Jordan [name] is an 51 y.o. female.   Chief Complaint: Refractory cough with bronchospasm  HPI: Jordan [name] was seen in the office April 16th diagnosed with acute bronchitis with wheezing. She was treated with Levaquin 250 mg qd x 7, hycodan cough syrup and 5day steripred dose pak. She returned to Saturday clinic April 20th for persistent wheezing and cough. She received an albuterol treatment and was prescribed abluterol MDI. For persistent symptoms she was seen at [facility] April 24th. Chest x-ray was negative. She did respond to several breathing treatments and was discharged to take dexamethasone and was to pick-up an Aerochamber to use with the albuterol MDI. Peak flow was check and was initially 160 with a predicted value of 400+. In the office today she was wheezing, had an oxygen saturation of 90% and peak flow was 180, 220. After an albuterol nebulizer treatment wheezing was better but the cough was persistent, she felt weak and short of breath. She is now admitted on 24 hr observation  for IV steroids, IV fluids, continued nebulizer treatments. All of her home medications will be continued.  Past Medical History  Diagnosis Date  . Headache   . Depressive disorder, not elsewhere classified   . Anemia, unspecified   . History of bulimia   . History of anorexia nervosa   . Meningitis, unspecified   . Other specified glaucoma   . Allergic rhinitis due to pollen   . Acute upper respiratory infections of unspecified site   . Diffuse cystic mastopathy   . Lump or mass in breast   . Perforation of tympanic membrane, unspecified   . Anal fissure   . Anxiety disorder   . Fibromyalgia   . IBS (irritable bowel syndrome)   . Seizures   . Radial styloid tenosynovitis   . 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 Dilation and curettage of uterus     History of Menometrorrhagia    Family History  Problem Relation Age of Onset  . Ovarian cancer Mother   . Heart  disease Mother   . Hypertension Mother   . Coronary artery disease Mother   . Heart failure Father   . Hypertension Father   . Hyperlipidemia Father   . Diabetes Father   . Breast cancer Maternal Grandmother   . Colon cancer Neg Hx    Social History:  reports that she has never smoked. She has never used smokeless tobacco. She reports that she drinks alcohol. She reports that she does not use illicit drugs.  Allergies: No Known Allergies  Medications Prior to Admission  Medication Sig Dispense Refill  . albuterol (PROVENTIL HFA;VENTOLIN HFA) 108 (90 BASE) MCG/ACT inhaler Inhale 1-2 puffs into the lungs every 6 (six) hours as needed for wheezing or shortness of breath (dispense with aerochamber. ). Please show pt proper use of inhaler  1 Inhaler  0  . ALPRAZolam (XANAX) 0.5 MG tablet Take 0.5 mg by mouth at bedtime as needed. For sleep.      . Armodafinil (NUVIGIL) 250 MG tablet Take 250 mg by mouth daily.        Marland Kitchen buPROPion (WELLBUTRIN XL) 300 MG 24 hr tablet Take 300 mg by mouth daily.        . DULoxetine (CYMBALTA) 60 MG capsule Take 60 mg by mouth daily.        . fluticasone (FLONASE) 50 MCG/ACT nasal spray Place 2 sprays into the nose daily.  16 g  6  . HYDROcodone-homatropine (HYCODAN) 5-1.5 MG/5ML syrup Take 5 mLs by mouth every 6 (six) hours as needed for cough.  120 mL  1  . Multiple Vitamin (MULITIVITAMIN WITH MINERALS) TABS Take 1 tablet by mouth daily.      . ranitidine (ZANTAC) 75 MG tablet Take 75 mg by mouth 2 (two) times daily as needed. For acid reflux.      . TRAZODONE HCL PO Take 2 tablets by mouth at bedtime.        No results found for this or any previous visit (from the past 48 hour(s)). Dg Chest 2 View  06/14/2011  *RADIOLOGY REPORT*  Clinical Data: Cough.  CHEST - 2 VIEW  Comparison: Two-view chest 04/07/2010.  Findings: The heart size is normal.  The lungs are clear.  The visualized soft tissues and bony thorax are unremarkable.  IMPRESSION: Negative chest.   Original Report Authenticated By: Jamesetta Orleans. MATTERN, M.D.    Review of Systems  Constitutional: Positive for weight loss and malaise/fatigue. Negative for fever, chills and diaphoresis.  HENT: Positive for congestion, sore throat and neck pain. Negative for hearing loss, ear pain, nosebleeds, tinnitus and ear discharge.   Eyes: Negative.   Respiratory: Positive for cough, shortness of breath and wheezing. Negative for hemoptysis and sputum production.   Cardiovascular: Positive for chest pain. Negative for palpitations, leg swelling and PND.  Gastrointestinal: Negative for heartburn, nausea, vomiting, abdominal pain, diarrhea and constipation.  Genitourinary: Negative.   Musculoskeletal: Positive for myalgias and back pain.  Skin: Negative.   Neurological: Positive for speech change, weakness and headaches. Negative for dizziness, tingling, tremors, sensory change and focal weakness.  Endo/Heme/Allergies: Negative.   Psychiatric/Behavioral: Positive for depression. Negative for suicidal ideas, hallucinations, memory loss and substance abuse. The patient is nervous/anxious and has insomnia.     Blood pressure 120/79, pulse 86, temperature 98.6 F (37 C), temperature source Oral, resp. rate 16, height 5\' 2"  (1.575 m), weight 113 lb (51.256 kg), SpO2 100.00%. Physical Exam  Gen'l - WNWD but haggard appearing white woman who is uncomfortable due to continued coughing and shortness of breath. HEENT- C&S clear, oropharynx w/o lesions Neck- supple, no thyromegaly Nodes - negative submandibular, cervical and supraclavicular region Cor - 2+ radial pulse, RRR, no murmur no gallop Chest - no deformity, tender to palpation at intercostal spaces Breast - deferred Pulm - mild increased WOB, persistent dry cough, initally she had diffuse wheezing which cleared with albuterol nebulizer treatment Abd- BS+, no HSM, no guarding or rebound. Mild muscular tenderness to palpation Pelvic/rectal -  deferred Ext - no deformity Neuro - A&O x 3, CN II-XII normal, MS- MAE Psych - anxious, fearful about being alone with this shortness of breath. Skin - no skin lesions noted. Assessment/Plan 1. Pulm - patient with asthmatic bronchitis that has failed out patient therapy for control. She has completed a course of levaquin. She is afebrile. She had mild hypoxemia in the office with O2 sat 90% on RA  Plan - observation admit            Lab - CBCD, Bmet            Rx - IV solumedrol 80 mg q8, tessalon perles, promethazine with codeine q 4, oxygen at 2 L [place] to keep O2 sat >92%. Will hold additional antibiotics unless she has leukocytosis with left shift.  Will continue her home medications.  Illene Regulus 06/15/2011, 7:20 PM

## 2011-06-15 NOTE — Telephone Encounter (Signed)
Called pt, she went to urgent care yesterday evening, but the urgent care advised her to see you this am.  I worked her in at 11am because she stated it would take her some time to get ready.  Let me know if this is not a good slot, and I promptly move her.  Thanks!

## 2011-06-16 MED ORDER — BOOST / RESOURCE BREEZE PO LIQD: 1.0000 | Freq: Every day | ORAL | Status: DC | PRN

## 2011-06-16 MED ORDER — PROMETHAZINE-CODEINE 6.25-10 MG/5ML PO SYRP
5.0000 mL | ORAL_SOLUTION | ORAL | Status: DC
  Administered 2011-06-16 – 2011-06-19 (×19): 5 mL via ORAL
  Filled 2011-06-16 (×19): qty 5

## 2011-06-16 MED ORDER — ALBUTEROL SULFATE HFA 108 (90 BASE) MCG/ACT IN AERS
1.0000 | INHALATION_SPRAY | Freq: Four times a day (QID) | RESPIRATORY_TRACT | Status: DC
  Administered 2011-06-16 – 2011-06-19 (×12): 2 via RESPIRATORY_TRACT

## 2011-06-16 MED ORDER — ENSURE COMPLETE PO LIQD
237.0000 mL | Freq: Two times a day (BID) | ORAL | Status: DC
  Administered 2011-06-16 – 2011-06-19 (×3): 237 mL via ORAL

## 2011-06-16 NOTE — Progress Notes (Addendum)
INITIAL ADULT NUTRITION ASSESSMENT Date: 06/16/2011   Time: 3:27 PM Reason for Assessment: Nutrition risk   ASSESSMENT: Female 51 y.o.  Dx: Refractory cough with bronchospasm  Food/Nutrition Related Hx: Pt lives alone and reports she has always nibbled on food throughout her life but her intake was very poor for the past 10 days PTA r/t nausea and lack of appetite from being on Levaquin. Pt states she avoided red meat and pork for the past 20 years but recently started trying grass fed beef. Pt reports lactose intolerance. Pt reports 11 pound unintended weight loss in the past month. Noted pt with history of bulimia, however pt states nausea/almost vomiting is r/t medication. Noted pt with history of anorexia nervosa, however pt states weight loss was unintended and is not against trying nutrition supplements to increase calorie intake. Pt reports her recent cough also contributes to nausea and states that almost immediately after eating she gets nauseated. Pt states this morning she almost vomited her breakfast. Pt getting Phenergan. Pt states she did better with lunch and ate 50%. Pt states she has been trying to rinse her mouth and brush her teeth frequently throughout the day to get the bad taste from coughing out of her mouth. Recommend MD order Biotene for mouth rinsing. Pt denies being on any nutritional supplements PTA. Pt denies any difficulty chewing or swallowing. Pt reports feeling weak PTA.   Hx:  Past Medical History  Diagnosis Date  . Headache   . Depressive disorder, not elsewhere classified   . Anemia, unspecified   . History of bulimia   . History of anorexia nervosa   . Meningitis, unspecified   . Other specified glaucoma   . Allergic rhinitis due to pollen   . Acute upper respiratory infections of unspecified site   . Diffuse cystic mastopathy   . Lump or mass in breast   . Perforation of tympanic membrane, unspecified   . Anal fissure   . Anxiety disorder   .  Fibromyalgia   . IBS (irritable bowel syndrome)   . Seizures   . Radial styloid tenosynovitis   . 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 Complication of anesthesia     wakes up slow   Related Meds:  Scheduled Meds:   . albuterol  1-2 puff Inhalation Q6H  . Armodafinil  250 mg Oral Daily  . benzonatate  100 mg Oral TID  . buPROPion  300 mg Oral Daily  . DULoxetine  60 mg Oral Daily  . enoxaparin  40 mg Subcutaneous Q24H  . fluticasone  1 spray Each Nare Daily  . guaiFENesin  600 mg Oral BID  . methylPREDNISolone (SOLU-MEDROL) injection  80 mg Intravenous Q8H  . pantoprazole  40 mg Oral Q1200  . promethazine-codeine  5 mL Oral Q4H  . senna  1 tablet Oral BID  . traZODone  100 mg Oral QHS   Continuous Infusions:   . dextrose 5 % and 0.45% NaCl 75 mL/hr at 06/15/11 2206   PRN Meds:.acetaminophen, acetaminophen, ALPRAZolam, naproxen, promethazine, zolpidem, DISCONTD: albuterol, DISCONTD: promethazine-codeine  Ht: 5\' 2"  (157.5 cm)  Wt: 113 lb (51.256 kg)  Ideal Wt: 110 lb % Ideal Wt: 103  Usual Wt: 124 lb % Usual Wt: 91  Body mass index is 20.67 kg/(m^2).   Labs:  CMP     Component Value Date/Time   NA 138 06/15/2011 2004   K 3.6 06/15/2011 2004   CL 104 06/15/2011 2004   CO2 23 06/15/2011 2004  GLUCOSE 83 06/15/2011 2004   BUN 11 06/15/2011 2004   CREATININE 0.81 06/15/2011 2004   CALCIUM 8.9 06/15/2011 2004   PROT 7.0 08/08/2009 1245   ALBUMIN 3.9 08/08/2009 1245   AST 17 08/08/2009 1245   ALT 14 08/08/2009 1245   ALKPHOS 57 08/08/2009 1245   BILITOT 0.5 08/08/2009 1245   GFRNONAA 83* 06/15/2011 2004   GFRAA >90 06/15/2011 2004    Intake/Output Summary (Last 24 hours) at 06/16/11 1541 Last data filed at 06/16/11 0900  Gross per 24 hour  Intake    360 ml  Output      3 ml  Net    357 ml   Last BM - 06/15/11  Diet Order: General   IVF:    dextrose 5 % and 0.45% NaCl Last Rate: 75 mL/hr at 06/15/11 2206    Estimated Nutritional  Needs:   Kcal:1550-1800 Protein:60-75g Fluid:1.5-1.8L  NUTRITION DIAGNOSIS: -Inadequate oral intake (NI-2.1).  Status: Ongoing -Pt meets criteria for severe PCM of acute illness AEB 8.8% weight loss in the past 4 weeks with <50% energy intake in the past 5 days per pt report   RELATED TO: nausea/coughing  AS EVIDENCE BY: pt statement, weight loss PTA  MONITORING/EVALUATION(Goals):  1. Resolution of nausea 2. Pt to consume >90% of meals/supplements  EDUCATION NEEDS: -Education needs addressed - discussed bland foods and nutrition therapy for nausea  INTERVENTION: Ensure Complete BID, Resource Breeze PRN. Encouraged gradual increased intake as nausea continues to improve. Encouraged rinsing mouth before meals to help with bad taste from coughing. Recommend MD to order Biotene to help with this. Will monitor.   Dietitian #: [phone]  DOCUMENTATION CODES Per approved criteria  -Severe malnutrition in the context of acute illness or injury    Marshall Cork 06/16/2011, 3:27 PM

## 2011-06-16 NOTE — Progress Notes (Signed)
Subjective: Not much change. She hasn't had MDI treatment since being in hospital. Steroids were started. She hasn't had cough syrup and just one tessalon perle last night. She is not short of breath like she was.  Objective: Lab: Lab Results  Component Value Date   WBC 8.0 06/15/2011   HGB 12.2 06/15/2011   HCT 34.9* 06/15/2011   MCV 92.1 06/15/2011   PLT 214 06/15/2011   BMET    Component Value Date/Time   NA 138 06/15/2011 2004   K 3.6 06/15/2011 2004   CL 104 06/15/2011 2004   CO2 23 06/15/2011 2004   GLUCOSE 83 06/15/2011 2004   BUN 11 06/15/2011 2004   CREATININE 0.81 06/15/2011 2004   CALCIUM 8.9 06/15/2011 2004   GFRNONAA 83* 06/15/2011 2004   GFRAA >90 06/15/2011 2004      Imaging: No imaging  Physical Exam: Filed Vitals:   06/16/11 0613  BP: 113/73  Pulse: 78  Temp: 98.1 F (36.7 C)  Resp: 18  WNWD white woman Pulm - no increased WOB, lungs CTAP Cor -RRR     Assessment/Plan: 1. PUlm - bronchitis with bronchospasm - too early for major results since she hasn't really had benefit of all meds ordered. Will change Albuterol from prn to scheduled. Continue present treatment. Hopefully home tomorrow, perhaps Sunday.   Casimiro [facility] [facility]ago 06/16/2011, 11:52 AM

## 2011-06-17 DIAGNOSIS — J9801 Acute bronchospasm: Secondary | ICD-10-CM

## 2011-06-17 DIAGNOSIS — J410 Simple chronic bronchitis: Secondary | ICD-10-CM

## 2011-06-17 DIAGNOSIS — E46 Unspecified protein-calorie malnutrition: Secondary | ICD-10-CM

## 2011-06-17 MED ORDER — METHYLPREDNISOLONE SODIUM SUCC 125 MG IJ SOLR
80.0000 mg | Freq: Two times a day (BID) | INTRAMUSCULAR | Status: DC
  Administered 2011-06-17: 19:00:00 via INTRAVENOUS
  Administered 2011-06-18: 80 mg via INTRAVENOUS
  Filled 2011-06-17 (×4): qty 1.28

## 2011-06-17 NOTE — Progress Notes (Signed)
Subjective: Generally doing better: slept well. Less cough. Much less wheezing. Appreciate nutrition consult for acute weight loss due to illness.  Objective: Lab: no new lab Imaging: no new imaging   Physical Exam: Filed Vitals:   06/17/11 0534  BP: 101/65  Pulse: 75  Temp: 98.1 F (36.7 C)  Resp: 17  gen'l - WNWD white woman in no distress Cor - RRR Pulm - good breath sounds, no wheezing, no increased work of breathing     Assessment/Plan: 1. PUlm - bronchitis with bronchospasm - responding to treatment Plan Reduce solumedrol to 80 q 12 x 2 dose then prednisone  2. Malnutrition - acute weight loss with illness. Consult appreciated. Supplements started.   Illene Regulus 06/17/2011, 9:17 AM

## 2011-06-18 MED ORDER — PREDNISONE 20 MG PO TABS
30.0000 mg | ORAL_TABLET | Freq: Every day | ORAL | Status: DC
  Administered 2011-06-18 – 2011-06-19 (×2): 30 mg via ORAL
  Filled 2011-06-18 (×3): qty 1

## 2011-06-18 NOTE — Progress Notes (Signed)
Subjective: Up walking the halls, feels better but is still having paroxysms of cough and mild SOB  Objective: Lab: No new lab Imaging: No new imaging  Physical Exam: Filed Vitals:   06/18/11 0615  BP: 132/76  Pulse: 63  Temp: 98.4 F (36.9 C)  Resp: 18  chest - good breath sounds.     Assessment/Plan: 1. PUlm - resolving post-bronchitic bronchospasm Plan Change to oral steroids: prednisone 30 mg bid  D/c IV fluids   Jordan [name] 06/18/2011, 11:17 AM

## 2011-06-18 NOTE — Progress Notes (Signed)
  Subjective:    Patient ID: Jordan [name], female    DOB: [birth year], 51 y.o.   MRN: [id number]  HPI Patient with post-bronchitic bronchospasm - admitted to hospital   Review of Systems     Objective:   Physical Exam        Assessment & Plan:

## 2011-06-19 ENCOUNTER — Ambulatory Visit: Payer: Self-pay | Admitting: Internal Medicine

## 2011-06-19 MED ORDER — PREDNISONE 10 MG PO TABS: 10.0000 mg | ORAL_TABLET | Freq: Every day | ORAL | Status: DC

## 2011-06-19 MED ORDER — PROMETHAZINE-CODEINE 6.25-10 MG/5ML PO SYRP: 5.0000 mL | ORAL_SOLUTION | ORAL | Status: AC

## 2011-06-19 MED ORDER — BENZONATATE 100 MG PO CAPS: 100.0000 mg | ORAL_CAPSULE | Freq: Three times a day (TID) | ORAL | Status: AC

## 2011-06-19 NOTE — Progress Notes (Signed)
Patient doing much better and ready for discharge. She will continue a prednisone burst and taper, continue prom/cod cough syrup.  Dictation # [phone] [id number]

## 2011-06-19 NOTE — Progress Notes (Signed)
Patient received discharge instructions with patient verbalizing understanding. Patient prescription given to her by Dr Debby Bud. Patient follow up appointments discussed. Patient ambulated downstairs to be discharged home. Patient drove self to hospital and will drive self home. Patient stable from AM assessment.

## 2011-06-20 NOTE — Discharge Summary (Signed)
NAME:  SHEARON, CLONCH NO.:  [id number]  MEDICAL RECORD NO.:  [id number]  LOCATION:                                 FACILITY:  PHYSICIAN:  Rosalyn Gess. Mcgregor Tinnon, MD  DATE OF BIRTH:  1960/03/31  DATE OF ADMISSION:  06/15/2011 DATE OF DISCHARGE:  06/19/2011                              DISCHARGE SUMMARY   ADMISSION DIAGNOSIS:  Bronchospasm.  DISCHARGE DIAGNOSIS:  Bronchospasm.  CONSULTANTS:  None.  PROCEDURES:  Chest x-ray on April 24, which showed normal heart size. Lungs were clear.  No abnormalities noted.  HISTORY OF PRESENT ILLNESS:  Ms. Salonga a pleasant 51 year old woman was seen in the office on April 16 and diagnosed with acute bronchitis with wheezing.  She was treated with Levaquin 250 mg daily x7, Hycodan cough syrup, 5 days Sterapred Dosepak.  She returned to the Saturday clinic, April 20, for persistent wheezing and cough.  At that time she received albuterol and was put on albuterol MDI.  She continued to have a problem with wheezing and cough was seen April 24 at the [facility] urgent care. Chest x-ray was negative as noted.  She did respond to breathing treatments, and was discharged home to take dexamethasone and to pick up an AeroChamber to use with her MDI.  Peak flow was checked and was initially 160 with a predicted value of 400+.  The patient presented to the office on the day of admission because of persistent wheezing. Oxygen saturation had dropped to 90%.  Peak flow was only 180.  The patient felt improved after an albuterol nebulizer treatment, but because of her persistent cough, 3 visits for acute care and ongoing wheezing, she was subsequently admitted to the hospital for IV steroids and further treatment.  Please see the H and P for past medical history, family history, and social history.  HOSPITAL COURSE: 1. Pulmonary.  The patient with a history of bronchitis adequately     treated with Levaquin, but with post bronchitic  bronchospasm that     was resistant.  The patient was started on Solu-Medrol 80 mg IV     q.12 hours.  This was continued through the 28th at which time she     was switched to prednisone 30 mg b.i.d.  The patient also was     treated with Tessalon pearls 100 mg t.i.d., promethazine with     codeine 1 teaspoon every 4-6 hours as needed for cough.  On this     regimen, she did slowly improve, and at the time of discharge     dictation had a minimal cough, no wheezing, and was hemodynamically     stable with an adequate oxygen saturation of 99% on room air.  PHYSICAL EXAMINATION:  On discharge, VITAL SIGNS:  Temperature was 97.8, blood pressure 120/74, pulse 67, respirations 20, O2 sat 99%. GENERAL APPEARANCE:  A well-nourished, well-developed woman in no acute distress. HEENT:  Exam unremarkable. CHEST:  No deformities. PULMONARY:  The patient has no increased work of breathing.  She has normal breath sounds with no rales, no wheezes.  She has normal expiatory phase.  She is able to take deep inspiration  without cough. CARDIOVASCULAR:  The patient had a quiet precordium.  She had a regular rate and rhythm.  No further examination conducted.  LABORATORY DATA:  The patient had labs at admission on 4/25 with a sodium of 138, potassium 3.6, chloride 104, CO2 of 23, BUN 11, creatinine 0.8, glucose 83.  White count was 8000 with 59% segs, 28% lymphs, 9% monos, 3% eosinophils, hemoglobin 12.2 g, platelet count 214,000.  DISPOSITION:  The patient will be discharged to home.  She is to continue on a prednisone taper dropping to 20 mg b.i.d. for 3 days, 30 mg daily x3 days, 20 mg daily x3 days, 10 mg daily x6 days.  She will continue on Tessalon Perles 100 mg t.i.d. and she will continue on promethazine with codeine 1 teaspoon q.6h as needed.  The patient will be seen in the office for followup in 1 week, sooner as needed.     Rosalyn Gess Marcellus Pulliam, MD     MEN/MEDQ  D:  06/19/2011  T:   06/20/2011  Job:  [id number]

## 2011-06-22 ENCOUNTER — Telehealth: Payer: Self-pay

## 2011-06-22 ENCOUNTER — Ambulatory Visit (INDEPENDENT_AMBULATORY_CARE_PROVIDER_SITE_OTHER): Payer: PRIVATE HEALTH INSURANCE | Admitting: Internal Medicine

## 2011-06-22 ENCOUNTER — Encounter: Payer: Self-pay | Admitting: Internal Medicine

## 2011-06-22 VITALS — BP 108/80 | HR 73 | Temp 97.4°F | Resp 16 | Wt 112.0 lb

## 2011-06-22 DIAGNOSIS — R062 Wheezing: Secondary | ICD-10-CM

## 2011-06-22 NOTE — Telephone Encounter (Signed)
Pt called stating she is still experiencing SOB, DOE and tightness in her chest. Pt was winded just speaking with me on the phone. She has post-hosp f/u scheduled 05/06 but pt is requesting to be seen today. Okay to add-on?

## 2011-06-22 NOTE — Telephone Encounter (Signed)
Per Dr Debby Bud, pt to be added on today for possible readmittance to hospital. Pt scheduled and informed of same.

## 2011-06-25 NOTE — Progress Notes (Signed)
  Subjective:    Patient ID: Jordan [name], female    DOB: 08-Jun-1960, 51 y.o.   MRN: [id number]  HPI Ms. Crain was just discharged from hospital where she spent 4 days due to post-bronchitic bronchospasm. She had called today with laryngitis and SOB. She is finishing a steroid taper, she has been able to manage her own ADLs. She is not particularly short of breath but she has difficulty talking due to hoarseness  PMH, FamHx and SocHx reviewed for any changes and relevance.    Review of Systems System review is negative for any constitutional, cardiac, pulmonary, GI or neuro symptoms or complaints other than as described in the HPI.     Objective:   Physical Exam Filed Vitals:   06/22/11 1216  BP: 108/80  Pulse: 73  Temp: 97.4 F (36.3 C)  Resp: 16   O2 sat 95% Gen'l- WNWD white woman in no distress Pulm - good breath sounds, no wheezing, no rales, no increased WOB. She is hoarse       Assessment & Plan:  Post-bronchitis bronchospasm - she appears to be doing OK.   Plan - finish steroids           Continue with MDI treatments as needed.           Will refer to pulmonary for evaluation of possible asthma.

## 2011-06-26 ENCOUNTER — Other Ambulatory Visit: Payer: Self-pay | Admitting: Internal Medicine

## 2011-06-26 ENCOUNTER — Encounter: Payer: Self-pay | Admitting: Internal Medicine

## 2011-06-27 ENCOUNTER — Telehealth: Payer: Self-pay | Admitting: Internal Medicine

## 2011-06-27 NOTE — Telephone Encounter (Signed)
Return to work from IM (me). She is to see pulmonary for consultation and diagnosis.

## 2011-06-27 NOTE — Telephone Encounter (Signed)
PATIENT WOULD LIKE TO KNOW IF SHE CAN RETURN TO WORK FOR 1/2 DAYS TILL MAY 13TH WHEN HER APPT. WITH PULMONOLOGIST IS. SHE REQUEST A NOTE FOR HER EMPLOYER THAT SHE CAN RETURN TO WORK TO DO THIS. STATES SHE HAS NO APPT. TO FOLLOW UP WITH [facility].    ADVISE.   SUE

## 2011-06-27 NOTE — Telephone Encounter (Signed)
Jordan [name] called unsure if she should schedule a post hospital here or if she is to be referred to Pulmonary.  She will need clearance to return to work.  When should she come in?

## 2011-06-29 ENCOUNTER — Encounter: Payer: Self-pay | Admitting: Internal Medicine

## 2011-06-29 NOTE — Telephone Encounter (Signed)
Letter done

## 2011-07-03 ENCOUNTER — Encounter: Payer: Self-pay | Admitting: Internal Medicine

## 2011-07-03 ENCOUNTER — Ambulatory Visit (INDEPENDENT_AMBULATORY_CARE_PROVIDER_SITE_OTHER): Payer: PRIVATE HEALTH INSURANCE | Admitting: Internal Medicine

## 2011-07-03 ENCOUNTER — Encounter: Payer: Self-pay | Admitting: *Deleted

## 2011-07-03 VITALS — BP 100/76 | HR 87 | Temp 98.4°F | Ht 62.0 in | Wt 115.6 lb

## 2011-07-03 DIAGNOSIS — R059 Cough, unspecified: Secondary | ICD-10-CM

## 2011-07-03 DIAGNOSIS — R05 Cough: Secondary | ICD-10-CM | POA: Insufficient documentation

## 2011-07-03 HISTORY — DX: Cough, unspecified: R05.9

## 2011-07-03 MED ORDER — DEXLANSOPRAZOLE 60 MG PO CPDR: 60.0000 mg | DELAYED_RELEASE_CAPSULE | Freq: Every day | ORAL | Status: DC

## 2011-07-03 MED ORDER — RANITIDINE HCL 75 MG PO TABS: 150.0000 mg | ORAL_TABLET | Freq: Two times a day (BID) | ORAL | Status: DC | PRN

## 2011-07-03 MED ORDER — TRAMADOL HCL 50 MG PO TABS: ORAL_TABLET | ORAL | Status: AC

## 2011-07-03 NOTE — Patient Instructions (Signed)
Only use the albuterol (inhalers) if needed for breathing after first controlling the coughing  Take delsym two tsp every 12 hours and supplement if needed with  tramadol 50 mg up to 1- 2 every 4 hours to suppress the urge to cough. Swallowing water or using ice chips/non mint and menthol containing candies (such as lifesavers or sugarless jolly ranchers) are also effective.  You should rest your voice and avoid activities that you know make you cough.  Once you have eliminated the cough for 3 straight days try reducing the tramadol first,  then the delsym as tolerated.    Dexilant 60 mg 30 min before your first meal and ranitidine 150 mg one bedtime  GERD (REFLUX)  is an extremely common cause of respiratory symptoms, many times with no significant heartburn at all.    It can be treated with medication, but also with lifestyle changes including avoidance of late meals, excessive alcohol, smoking cessation, and avoid fatty foods, chocolate, peppermint, colas, red wine, and acidic juices such as orange juice.  NO MINT OR MENTHOL PRODUCTS SO NO COUGH DROPS  USE SUGARLESS CANDY INSTEAD (jolley ranchers or Stover's)  NO OIL BASED VITAMINS - use powdered substitutes.   Please schedule a follow up office visit in 2 weeks, sooner if needed

## 2011-07-03 NOTE — Assessment & Plan Note (Addendum)
The most common causes of chronic cough in immunocompetent adults include the following: upper airway cough syndrome (UACS), previously referred to as postnasal drip syndrome (PNDS), which is caused by variety of rhinosinus conditions; (2) asthma; (3) GERD; (4) chronic bronchitis from cigarette smoking or other inhaled environmental irritants; (5) nonasthmatic eosinophilic bronchitis; and (6) bronchiectasis.   These conditions, singly or in combination, have accounted for up to 94% of the causes of chronic cough in prospective studies.   Other conditions have constituted no >6% of the causes in prospective studies These have included bronchogenic carcinoma, chronic interstitial pneumonia, sarcoidosis, left ventricular failure, ACEI-induced cough, and aspiration from a condition associated with pharyngeal dysfunction.   Most likely this is a form of  Classic Upper airway cough syndrome, so named because it's frequently impossible to sort out how much is  CR/sinusitis with freq throat clearing (which can be related to primary GERD)   vs  causing  secondary (" extra esophageal")  GERD from wide swings in gastric pressure that occur with throat clearing, often  promoting self use of mint and menthol lozenges that reduce the lower esophageal sphincter tone and exacerbate the problem further in a cyclical fashion.   These are the same pts (now being labeled as having "irritable larynx syndrome" by some cough centers) who not infrequently have a history of having failed to tolerate ace inhibitors,  dry powder inhalers or biphosphonates or report having atypical reflux symptoms that don't respond to standard doses of PPI , and are easily confused as having aecopd or asthma flares by even experienced allergists/ pulmonologists.   For now will try to eliminate cyclical coughing and GERD then regroup.  Not clear this isn't asthma but absence of symptoms disturbing sleep is very unusual for asthma as is her nl f/v  loop today at a time when she says her peak flows are down and she feels the urge to uses saba

## 2011-07-03 NOTE — Progress Notes (Signed)
Subjective:    Patient ID: Jordan [name], female    DOB: [birth year]  MRN: [id number]  HPI  51 yowf never smoker with h/o spring > fall eyes and nose itching and running since 2007 when morved to [place] from [place] referred by [facility]ins 07/03/2011 for eval of cough.  07/03/2011 1st pulmonary eval cc acute late April lost voice, dry cough not assoc with all typical spring time rhinitis symptoms with worsening sob subjective wheeze and ultimately admitted with dx of asthma exac  DATE OF ADMISSION: 06/15/2011  DATE OF DISCHARGE: 06/19/2011  DISCHARGE SUMMARY  ADMISSION DIAGNOSIS: Bronchospasm.  DISCHARGE DIAGNOSIS: Bronchospasm.  CONSULTANTS: None.  PROCEDURES: Chest x-ray on April 24, which showed normal heart size.  Lungs were clear. No abnormalities noted.  HISTORY OF PRESENT ILLNESS: Ms. Jordan [name] a pleasant 51 year old woman was  seen in the office on April 16 and diagnosed with acute bronchitis with  wheezing. She was treated with Levaquin 250 mg daily x7, Hycodan cough  syrup, 5 days Sterapred Dosepak. She returned to the Saturday clinic,  April 20, for persistent wheezing and cough. At that time she received  albuterol and was put on albuterol MDI. She continued to have a problem  with wheezing and cough was seen April 24 at the [facility] urgent care.  Chest x-ray was negative as noted. She did respond to breathing  treatments, and was discharged home to take dexamethasone and to pick up  an AeroChamber to use with her MDI. Peak flow was checked and was  initially 160 with a predicted value of 400+. The patient presented to  the office on the day of admission because of persistent wheezing.  Oxygen saturation had dropped to 90%. Peak flow was only 180. The  patient felt improved after an albuterol nebulizer treatment, but  because of her persistent cough, 3 visits for acute care and ongoing  wheezing, she was subsequently admitted to the hospital for IV steroids  and further  treatment.  07/03/2011 post f/u ov 80% better p last prednisone sev dats prior to OV  On proventil sev times a day but not using on day of ov and feels her flow rates are down and she would have usually have used it by hour of ov.  Has zantac but not using it.  Symptoms more day than night.  Cough is dry, usually only sob when coughing.  No overt hb or sinus complaints at present  Interestingly, Sleeping ok without nocturnal  or early am exacerbation  of respiratory  c/o's or need for noct saba. Also denies any obvious fluctuation of symptoms with weather or environmental changes or other aggravating or alleviating factors except as outlined above     Review of Systems  Constitutional: Negative for fever and unexpected weight change.  HENT: Positive for congestion. Negative for ear pain, nosebleeds, sore throat, rhinorrhea, sneezing, trouble swallowing, dental problem, postnasal drip and sinus pressure.   Eyes: Negative for redness and itching.  Respiratory: Positive for cough, chest tightness and shortness of breath. Negative for wheezing.   Cardiovascular: Negative for palpitations and leg swelling.  Gastrointestinal: Negative for nausea and vomiting.  Genitourinary: Negative for dysuria.  Musculoskeletal: Negative for joint swelling.  Skin: Negative for rash.  Neurological: Negative for headaches.  Hematological: Does not bruise/bleed easily.  Psychiatric/Behavioral: Negative for dysphoric mood. The patient is not nervous/anxious.        Objective:   Physical Exam  amb wf nad with freq dry cough   HEENT:  nl dentition, turbinates, and orophanx. Nl external ear canals without cough reflex   NECK :  without JVD/Nodes/TM/ nl carotid upstrokes bilaterally   LUNGS: no acc muscle use, clear to A and P bilaterally without cough on insp or exp maneuvers   CV:  RRR  no s3 or murmur or increase in P2, no edema   ABD:  soft and nontender with nl excursion in the supine position. No  bruits or organomegaly, bowel sounds nl  MS:  warm without deformities, calf tenderness, cyanosis or clubbing  SKIN: warm and dry without lesions    NEURO:  alert, approp, no deficits    cxr 06/14/11 Negative chest.       Assessment & Plan:

## 2011-07-19 ENCOUNTER — Encounter: Payer: Self-pay | Admitting: Internal Medicine

## 2011-07-19 ENCOUNTER — Ambulatory Visit (INDEPENDENT_AMBULATORY_CARE_PROVIDER_SITE_OTHER): Payer: PRIVATE HEALTH INSURANCE | Admitting: Internal Medicine

## 2011-07-19 ENCOUNTER — Encounter: Payer: Self-pay | Admitting: *Deleted

## 2011-07-19 VITALS — BP 90/60 | HR 82 | Temp 98.3°F | Ht 62.0 in | Wt 115.0 lb

## 2011-07-19 DIAGNOSIS — R059 Cough, unspecified: Secondary | ICD-10-CM

## 2011-07-19 DIAGNOSIS — R05 Cough: Secondary | ICD-10-CM

## 2011-07-19 MED ORDER — PREDNISONE (PAK) 10 MG PO TABS: ORAL_TABLET | ORAL | Status: DC

## 2011-07-19 MED ORDER — TRAMADOL HCL 50 MG PO TABS: ORAL_TABLET | ORAL | Status: AC

## 2011-07-19 NOTE — Patient Instructions (Addendum)
The key to effective treatment for your cough is eliminating the non-stop cycle of cough you're stuck in long enough to let your airway heal completely and then see if there is anything still making you cough once you stop the cough suppression, but this should take no more than 5 days to figure out.  First take delsym two tsp every 12 hours and supplement if needed with  tramadol 50 mg up to 2 every 4 hours to suppress the urge to cough. Swallowing water or using ice chips/non mint and menthol containing candies (such as lifesavers or sugarless jolly ranchers) are also effective.  You should rest your voice and avoid activities that you know make you cough.  Once you have eliminated the cough for 3 straight days try reducing the tramadol first,  then the delsym as tolerated.    Try dexilant 60 mg  Take 30-60 min before first meal of the day and Zantac 150  one bedtime until cough is completely gone for at least a week without the need for cough suppression  I think of reflux for chronic cough like I do oxygen for fire (doesn't cause the fire but once you get the oxygen suppressed it usually goes away regardless of the exact cause).  GERD (REFLUX)  is an extremely common cause of respiratory symptoms, many times with no significant heartburn at all.    It can be treated with medication, but also with lifestyle changes including avoidance of late meals, excessive alcohol, smoking cessation, and avoid fatty foods, chocolate, peppermint, colas, red wine, and acidic juices such as orange juice.  NO MINT OR MENTHOL PRODUCTS SO NO COUGH DROPS  USE SUGARLESS CANDY INSTEAD (jolley ranchers or Stover's)  NO OIL BASED VITAMINS - use powdered substitutes.    Prednisone 10 mg take  4 each am x 2 days,   2 each am x 2 days,  1 each am x2days and stop      Please schedule a follow up office visit in 2 weeks, sooner if needed

## 2011-07-19 NOTE — Progress Notes (Signed)
Subjective:    Patient ID: Jordan [name], female    DOB: 24-Apr-1960  MRN: [id number]  HPI  51 yowf never smoker with h/o spring > fall eyes and nose itching and running since 2007 when moved to [place] from [place]  referred by [facility] 07/03/2011 for eval of cough.  07/03/2011 1st pulmonary eval cc acute late April lost voice, dry cough not assoc with all typical spring time rhinitis symptoms with worsening sob subjective wheeze and ultimately admitted with dx of asthma exac  DATE OF ADMISSION: 06/15/2011  DATE OF DISCHARGE: 06/19/2011  DISCHARGE SUMMARY  ADMISSION DIAGNOSIS: Bronchospasm.  DISCHARGE DIAGNOSIS: Bronchospasm.  CONSULTANTS: None.  PROCEDURES: Chest x-ray on April 24, which showed normal heart size.  Lungs were clear. No abnormalities noted.  HISTORY OF PRESENT ILLNESS: Jordan [name] a pleasant 51 year old woman was  seen in the office on April 16 and diagnosed with acute bronchitis with  wheezing. She was treated with Levaquin 250 mg daily x7, Hycodan cough  syrup, 5 days Sterapred Dosepak. She returned to the Saturday clinic,  April 20, for persistent wheezing and cough. At that time she received  albuterol and was put on albuterol MDI. She continued to have a problem  with wheezing and cough was seen April 24 at the [facility] urgent care.  Chest x-ray was negative as noted. She did respond to breathing  treatments, and was discharged home to take dexamethasone and to pick up  an AeroChamber to use with her MDI. Peak flow was checked and was  initially 160 with a predicted value of 400+. The patient presented to  the office on the day of admission because of persistent wheezing.  Oxygen saturation had dropped to 90%. Peak flow was only 180. The  patient felt improved after an albuterol nebulizer treatment, but  because of her persistent cough, 3 visits for acute care and ongoing  wheezing, she was subsequently admitted to the hospital for IV steroids  and further  treatment.  07/03/2011 post f/u ov 51% better p last prednisone sev days prior to OV  On proventil sev times a day but not using on day of ov and feels her flow rates are down and she would have usually have used it by hour of ov.  Has zantac but not using it.  Symptoms more day than night.  Cough is dry, usually only sob when coughing.  No overt hb or sinus complaints at present rec Only use the albuterol (inhalers) if needed for breathing after first controlling the coughing Take delsym two tsp every 12 hours and supplement if needed with  tramadol 50 mg up to 1- 2 every 4 hours  Once you have eliminated the cough for 3 straight days try reducing the tramadol first,  then the delsym as tolerated.   Dexilant 60 mg 30 min before your first meal and ranitidine 150 mg one bedtime GERD diet.   07/19/2011 f/u ov/Giorgi Debruin cc cough never eradicated but took no more than 4 total tramadol in any 24 h period, not using albuterol, cough remains dry, no overt hb or sinus complaints. No variability.  Interestingly, Sleeping ok without nocturnal  or early am exacerbation  of respiratory  c/o's or need for noct saba. Also denies any obvious fluctuation of symptoms with weather or environmental changes or other aggravating or alleviating factors except as outlined above.  ROS  At present neg for  any significant sore throat, dysphagia, dental problems, itching, sneezing,  nasal congestion or excess/  purulent secretions, ear ache,   fever, chills, sweats, unintended wt loss, pleuritic or exertional cp, hemoptysis, palpitations, orthopnea pnd or leg swelling.  Also denies presyncope, palpitations, heartburn, abdominal pain, anorexia, nausea, vomiting, diarrhea  or change in bowel or urinary habits, change in stools or urine, dysuria,hematuria,  rash, arthralgias, visual complaints, headache, numbness weakness or ataxia or problems with walking or coordination. No noted change in mood/affect or memory.               Objective:   Physical Exam  amb wf nad with freq dry cough and throat clearing  HEENT: nl dentition, turbinates, and orophanx. Nl external ear canals without cough reflex   NECK :  without JVD/Nodes/TM/ nl carotid upstrokes bilaterally   LUNGS: no acc muscle use, clear to A and P bilaterally without cough on insp or exp maneuvers   CV:  RRR  no s3 or murmur or increase in P2, no edema   ABD:  soft and nontender with nl excursion in the supine position. No bruits or organomegaly, bowel sounds nl  MS:  warm without deformities, calf tenderness, cyanosis or clubbing       cxr 06/14/11 Negative chest.       Assessment & Plan:

## 2011-07-21 NOTE — Assessment & Plan Note (Signed)
The most common causes of chronic cough in immunocompetent adults include the following: upper airway cough syndrome (UACS), previously referred to as postnasal drip syndrome (PNDS), which is caused by variety of rhinosinus conditions; (2) asthma; (3) GERD; (4) chronic bronchitis from cigarette smoking or other inhaled environmental irritants; (5) nonasthmatic eosinophilic bronchitis; and (6) bronchiectasis.   These conditions, singly or in combination, have accounted for up to 94% of the causes of chronic cough in prospective studies.   Other conditions have constituted no >6% of the causes in prospective studies These have included bronchogenic carcinoma, chronic interstitial pneumonia, sarcoidosis, left ventricular failure, ACEI-induced cough, and aspiration from a condition associated with pharyngeal dysfunction.   Chronic cough is often simultaneously caused by more than one condition. A single cause has been found from 38 to 82% of the time, multiple causes from 18 to 62%. Multiply caused cough has been the result of three diseases up to 42% of the time.   Reviewed with Pt:   The standardized cough guidelines recently published in Chest by [place] in 2006  are a multiple step process (up to 12!) , not a single office visit,  and are intended  to address this problem logically,  with an alogrithm dependent on response to empiric treatment at  each progressive step  to determine a specific diagnosis with  minimal addtional testing needed. Therefore if compliance is an issue or can't be accurately verified then it's very unlikely the standard evaluation and treatment will be successful here.    Furthermore, response to therapy (other than acute cough suppression, which should only be used short term with avoidance of narcotic containing cough syrups if possible), can be a gradual process for which the patient may not receive immediate benefit.  Unlike going to an eye doctor where the right rx  is almost always the first one and is immediately effective, this is almost never the case in the management of chronic cough syndromes and the patient needs to commit up front to compliance with recommendations and have the patience to wait out a response for up to 6 weeks of therapy directed at the likely underlying problem(s).    Never able to elminate 100% the cycle of cough > airway trauma> inflammation > irritable throat > cough but need to do so for at least 3 days (preferably 5) before next step considered  .

## 2011-07-26 ENCOUNTER — Telehealth: Payer: Self-pay | Admitting: Internal Medicine

## 2011-07-26 NOTE — Telephone Encounter (Signed)
[id number] to advise the pt of protocol to change docs and that MW is not back until June 12th. Carron Curie, CMA

## 2011-07-26 NOTE — Telephone Encounter (Signed)
Pt returned call.  Advised pt of protocol to change docs & MW is not back until June 12th.  Pt ok with this.   Antionette Fairy

## 2011-07-26 NOTE — Telephone Encounter (Signed)
Dr. Sherene Sires, please advise, thanks!

## 2011-07-27 ENCOUNTER — Encounter: Payer: Self-pay | Admitting: Adult Health

## 2011-07-27 ENCOUNTER — Ambulatory Visit: Payer: Self-pay | Admitting: Adult Health

## 2011-07-27 ENCOUNTER — Ambulatory Visit (INDEPENDENT_AMBULATORY_CARE_PROVIDER_SITE_OTHER): Payer: PRIVATE HEALTH INSURANCE | Admitting: Adult Health

## 2011-07-27 VITALS — BP 120/78 | HR 81 | Temp 100.9°F | Ht 62.0 in | Wt 117.2 lb

## 2011-07-27 DIAGNOSIS — J40 Bronchitis, not specified as acute or chronic: Secondary | ICD-10-CM

## 2011-07-27 MED ORDER — AMOXICILLIN-POT CLAVULANATE 875-125 MG PO TABS: 1.0000 | ORAL_TABLET | Freq: Two times a day (BID) | ORAL | Status: AC

## 2011-07-27 NOTE — Progress Notes (Signed)
Subjective:    Patient ID: Jordan [name], female    DOB: 02-15-1961  MRN: [id number]  HPI  51 yowf never smoker with h/o spring > fall eyes and nose itching and running since 2007 when moved to [place] from [place]  referred by [facility] 07/03/2011 for eval of cough.  07/03/2011 1st pulmonary eval cc acute late April lost voice, dry cough not assoc with all typical spring time rhinitis symptoms with worsening sob subjective wheeze and ultimately admitted with dx of asthma exac  DATE OF ADMISSION: 06/15/2011  DATE OF DISCHARGE: 06/19/2011  DISCHARGE SUMMARY  ADMISSION DIAGNOSIS: Bronchospasm.  DISCHARGE DIAGNOSIS: Bronchospasm.  CONSULTANTS: None.  PROCEDURES: Chest x-ray on April 24, which showed normal heart size.  Lungs were clear. No abnormalities noted.  HISTORY OF PRESENT ILLNESS: Jordan [name] a pleasant 51 year old woman was  seen in the office on April 16 and diagnosed with acute bronchitis with  wheezing. She was treated with Levaquin 250 mg daily x7, Hycodan cough  syrup, 5 days Sterapred Dosepak. She returned to the Saturday clinic,  April 20, for persistent wheezing and cough. At that time she received  albuterol and was put on albuterol MDI. She continued to have a problem  with wheezing and cough was seen April 24 at the [facility] urgent care.  Chest x-ray was negative as noted. She did respond to breathing  treatments, and was discharged home to take dexamethasone and to pick up  an AeroChamber to use with her MDI. Peak flow was checked and was  initially 160 with a predicted value of 400+. The patient presented to  the office on the day of admission because of persistent wheezing.  Oxygen saturation had dropped to 90%. Peak flow was only 180. The  patient felt improved after an albuterol nebulizer treatment, but  because of her persistent cough, 3 visits for acute care and ongoing  wheezing, she was subsequently admitted to the hospital for IV steroids  and further  treatment.  07/03/2011 post f/u ov 80% better p last prednisone sev days prior to OV  On proventil sev times a day but not using on day of ov and feels her flow rates are down and she would have usually have used it by hour of ov.  Has zantac but not using it.  Symptoms more day than night.  Cough is dry, usually only sob when coughing.  No overt hb or sinus complaints at present rec Only use the albuterol (inhalers) if needed for breathing after first controlling the coughing Take delsym two tsp every 12 hours and supplement if needed with  tramadol 50 mg up to 1- 2 every 4 hours  Once you have eliminated the cough for 3 straight days try reducing the tramadol first,  then the delsym as tolerated.   Dexilant 60 mg 30 min before your first meal and ranitidine 150 mg one bedtime GERD diet.   07/19/2011 f/u ov/Wert cc cough never eradicated but took no more than 4 total tramadol in any 24 h period, not using albuterol, cough remains dry, no overt hb or sinus complaints. No variability. >>PPI and tramadol rec   07/27/2011 Acute OV  Complains of cough , congestion and hoarseness worse for last week  Now has fever -101 .  No hemoptysis or chest pain .  No edema.  OTC not helping.      ROS:  Constitutional:   No  weight loss, night sweats,  Fevers, chill +, fatigue, or  lassitude.  HEENT:   No  Difficulty swallowing,  Tooth/dental problems, or  Sore throat,                No sneezing, itching, ear ache,  +nasal congestion, post nasal drip,   CV:  No chest pain,  Orthopnea, PND, swelling in lower extremities, anasarca, dizziness, palpitations, syncope.   GI  No heartburn, indigestion, abdominal pain, nausea, vomiting, diarrhea, change in bowel habits, loss of appetite, bloody stools.   Resp:    No coughing up of blood.   No chest wall deformity  Skin: no rash or lesions.  GU: no dysuria, change in color of urine, no urgency or frequency.  No flank pain, no hematuria   MS:  No joint  pain or swelling.  No decreased range of motion.  No back pain.  Psych:  No change in mood or affect. No depression or anxiety.  No memory loss.         Objective:   Physical Exam  amb wf nad   HEENT: nl dentition, turbinates, and orophanx. Nl external ear canals without cough reflex   NECK :  without JVD/Nodes/TM/ nl carotid upstrokes bilaterally   LUNGS: no acc muscle use, clear to A and P bilaterally     CV:  RRR  no s3 or murmur or increase in P2, no edema   ABD:  soft and nontender with nl excursion in the supine position. No bruits or organomegaly, bowel sounds nl  MS:  warm without deformities, calf tenderness, cyanosis or clubbing       cxr 06/14/11 Negative chest.       Assessment & Plan:

## 2011-07-27 NOTE — Telephone Encounter (Signed)
LMTCB

## 2011-07-27 NOTE — Telephone Encounter (Signed)
Spoke with pt and notified of MW recs. She verbalized understanding. States that she is unsure about referral to [facility] at this point. She is concerned about recurrent fever that has been bothering her all wk and states that her cough is only minimally better. She states that this is how she felt before last hospital admit. OV with TP at 3:30 today for eval.

## 2011-07-27 NOTE — Assessment & Plan Note (Signed)
Augmentin 875mg Twice daily  For 7days  Mucinex DM Twice daily  As needed  Cough/congestion  Fluids and rest  Please contact office for sooner follow up if symptoms do not improve or worsen or seek emergency care   

## 2011-07-27 NOTE — Telephone Encounter (Signed)
Fine with me though the best option for her would be to consider referral to [facility] - I had already explained the up to 12 step process to her that we use based on Richard Irwin's guidelines and she didn't actually comlete step one yet

## 2011-07-27 NOTE — Patient Instructions (Signed)
Augmentin 875mg Twice daily  For 7days  Mucinex DM Twice daily  As needed  Cough/congestion  Fluids and rest  Please contact office for sooner follow up if symptoms do not improve or worsen or seek emergency care   

## 2011-07-28 ENCOUNTER — Telehealth: Payer: Self-pay | Admitting: Adult Health

## 2011-07-28 ENCOUNTER — Ambulatory Visit: Payer: Self-pay | Admitting: Adult Health

## 2011-07-28 MED ORDER — CEFDINIR 300 MG PO CAPS: 300.0000 mg | ORAL_CAPSULE | Freq: Two times a day (BID) | ORAL | Status: AC

## 2011-07-28 NOTE — Telephone Encounter (Signed)
Omnicef 300mg  Twice daily  X 7d, #14 , no refills  Eat yogurt while on meds  Take abx with food  Please contact office for sooner follow up if symptoms do not improve or worsen or seek emergency care

## 2011-07-28 NOTE — Telephone Encounter (Signed)
Spoke with pt and notified of all recs per TP. Pt verbalized understanding and states nothing further needed. Omnicef was sent to pharm.

## 2011-07-28 NOTE — Telephone Encounter (Signed)
Pt states she ate a light dinner last night and took her first dose of the Augmentin. She had severe nausea and vomiting after only one dose. She has not taken anymore of the abx and is requesting that we call in an alternate medication for her. Pls advise.No Known Allergies

## 2011-07-31 ENCOUNTER — Telehealth: Payer: Self-pay | Admitting: Internal Medicine

## 2011-07-31 NOTE — Telephone Encounter (Signed)
Pt still has a fever and "crackley" in the chest.  Her fever is around 100 today.

## 2011-07-31 NOTE — Telephone Encounter (Signed)
Last visit was with Jordan [name] in pulmonary, who has been helping with this prolonged respiratory problem. I suggest a return visit to Dr. Sherene Sires or Jordan. If this is not acceptable she can be worked into my schedule and I can try to help.

## 2011-08-01 NOTE — Telephone Encounter (Signed)
Pt is aware.  She is on antibiotics and will give them more time to work.  If not better she will call Pulmonary.

## 2011-08-03 ENCOUNTER — Ambulatory Visit: Payer: Self-pay | Admitting: Adult Health

## 2011-08-11 ENCOUNTER — Telehealth: Payer: Self-pay | Admitting: Internal Medicine

## 2011-08-11 NOTE — Telephone Encounter (Signed)
Please advise thanks.

## 2011-08-12 ENCOUNTER — Ambulatory Visit: Payer: Self-pay | Admitting: Family Medicine

## 2011-08-12 ENCOUNTER — Ambulatory Visit: Payer: Self-pay | Admitting: Internal Medicine

## 2011-08-12 DIAGNOSIS — Z0289 Encounter for other administrative examinations: Secondary | ICD-10-CM

## 2011-08-12 NOTE — Telephone Encounter (Signed)
Fine with me - however, we need to remind her:  The standardized cough guidelines recently published in Chest by [place] in 2006  are a multiple step process (up to 12!) , not a single office visit,  and are intended  to address this problem logically,  with an alogrithm dependent on response to empiric treatment at  each progressive step  to determine a specific diagnosis with  minimal addtional testing needed. Therefore if compliance is an issue or can't be accurately verified then it's very unlikely the standard evaluation and treatment will be successful here.    Furthermore, response to therapy (other than acute cough suppression, which should only be used short term with avoidance of narcotic containing cough syrups if possible), can be a gradual process for which the patient may not receive immediate benefit.  Unlike going to an eye doctor where the right rx is almost always the first one and is immediately effective, this is almost never the case in the management of chronic cough syndromes and the patient needs to commit up front to compliance with recommendations and have the patience to wait out a response for up to 6 weeks of therapy directed at the likely underlying problem(s).    If she's willing to do the f/u steps then we should see her here, if not, refer to [facility]

## 2011-08-15 NOTE — Telephone Encounter (Signed)
LMTCB x 1 

## 2011-08-23 ENCOUNTER — Encounter (HOSPITAL_COMMUNITY): Payer: Self-pay | Admitting: Emergency Medicine

## 2011-08-23 ENCOUNTER — Telehealth: Payer: Self-pay | Admitting: Internal Medicine

## 2011-08-23 ENCOUNTER — Emergency Department (HOSPITAL_COMMUNITY): Payer: PRIVATE HEALTH INSURANCE

## 2011-08-23 ENCOUNTER — Emergency Department (HOSPITAL_COMMUNITY)
Admission: EM | Admit: 2011-08-23 | Discharge: 2011-08-23 | Disposition: A | Discharge status: Home / Self Care | Payer: PRIVATE HEALTH INSURANCE | Attending: Emergency Medicine | Admitting: Emergency Medicine

## 2011-08-23 DIAGNOSIS — Z79899 Other long term (current) drug therapy: Secondary | ICD-10-CM | POA: Insufficient documentation

## 2011-08-23 DIAGNOSIS — IMO0001 Reserved for inherently not codable concepts without codable children: Secondary | ICD-10-CM | POA: Insufficient documentation

## 2011-08-23 DIAGNOSIS — R079 Chest pain, unspecified: Secondary | ICD-10-CM | POA: Insufficient documentation

## 2011-08-23 DIAGNOSIS — F341 Dysthymic disorder: Secondary | ICD-10-CM | POA: Insufficient documentation

## 2011-08-23 DIAGNOSIS — K589 Irritable bowel syndrome without diarrhea: Secondary | ICD-10-CM | POA: Insufficient documentation

## 2011-08-23 DIAGNOSIS — F411 Generalized anxiety disorder: Secondary | ICD-10-CM | POA: Insufficient documentation

## 2011-08-23 DIAGNOSIS — F419 Anxiety disorder, unspecified: Secondary | ICD-10-CM

## 2011-08-23 LAB — CBC
HCT: 39.1 % (ref 36.0–46.0)
Hemoglobin: 13.7 g/dL (ref 12.0–15.0)
MCH: 32.9 pg (ref 26.0–34.0)
MCHC: 35 g/dL (ref 30.0–36.0)
MCV: 93.8 fL (ref 78.0–100.0)
Platelets: 207 10*3/uL (ref 150–400)
RBC: 4.17 MIL/uL (ref 3.87–5.11)
RDW: 12.3 % (ref 11.5–15.5)
WBC: 4.9 10*3/uL (ref 4.0–10.5)

## 2011-08-23 LAB — BASIC METABOLIC PANEL
BUN: 12 mg/dL (ref 6–23)
CO2: 30 mEq/L (ref 19–32)
Calcium: 9.5 mg/dL (ref 8.4–10.5)
Chloride: 100 mEq/L (ref 96–112)
Creatinine, Ser: 0.92 mg/dL (ref 0.50–1.10)
GFR calc Af Amer: 82 mL/min — ABNORMAL LOW (ref >90)
GFR calc non Af Amer: 71 mL/min — ABNORMAL LOW (ref >90)
Glucose, Bld: 76 mg/dL (ref 70–99)
Potassium: 4 mEq/L (ref 3.5–5.1)
Sodium: 142 mEq/L (ref 135–145)

## 2011-08-23 LAB — POCT I-STAT TROPONIN I: Troponin i, poc: 0 ng/mL (ref 0.00–0.08)

## 2011-08-23 NOTE — ED Notes (Signed)
Pt refuses to leave monitor on at this time due to her stating she is planning on signing out AMA.

## 2011-08-23 NOTE — ED Notes (Signed)
Pt c/o left sided CP worse with movement and palpation x 3 days with some SOB and nausea

## 2011-08-23 NOTE — ED Notes (Signed)
No rx given, pt voiced understanding to f/u with PCP w/i 3 days and return for worsening of sx.

## 2011-08-23 NOTE — ED Notes (Signed)
Pt c/o intermittent CP for the past 3 days, states she thinks it was a "panic attack" and has hx of the same.  Denies CP, SOB, n/v at this time.

## 2011-08-23 NOTE — ED Provider Notes (Signed)
History     CSN: [id number]  Arrival date & time 08/23/11  1624   First MD Initiated Contact with Patient 08/23/11 1903      Chief Complaint  Patient presents with  . Chest Pain    (Consider location/radiation/quality/duration/timing/severity/associated sxs/prior treatment) Patient is a 51 y.o. female presenting with chest pain. The history is provided by the patient.  Chest Pain The chest pain began yesterday. Chest pain occurs intermittently. The chest pain is resolved. At its most intense, the pain is at 5/10. The pain is currently at 0/10. The quality of the pain is described as aching and similar to previous episodes. The pain does not radiate. Chest pain is worsened by stress. Primary symptoms include palpitations. Pertinent negatives for primary symptoms include no fever, no shortness of breath, no cough, no abdominal pain, no nausea ("quesy") and no vomiting.  The palpitations did not occur with shortness of breath.  She tried nothing for the symptoms. Risk factors include no known risk factors.  Her past medical history is significant for anxiety/panic attacks.  Her family medical history is significant for CAD in family.  Pertinent negatives for family medical history include: no early MI in family and no PE in family.     Past Medical History  Diagnosis Date  . Headache   . Depressive disorder, not elsewhere classified   . Anemia, unspecified   . History of bulimia   . History of anorexia nervosa   . Meningitis, unspecified   . Other specified glaucoma   . Allergic rhinitis due to pollen   . Acute upper respiratory infections of unspecified site   . Diffuse cystic mastopathy   . Lump or mass in breast   . Perforation of tympanic membrane, unspecified   . Anal fissure   . Anxiety disorder   . Fibromyalgia   . IBS (irritable bowel syndrome)   . Seizures   . Radial styloid tenosynovitis   . 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 Complication of anesthesia     wakes up  slow    Past Surgical History  Procedure Date  . Dilation and curettage of uterus     History of Menometrorrhagia  . Wrist surgery     for de-quervaine - bilateral wrists - tendonitis  . Ankle surgery     right ankle - scar tissue  . Inner ear surgery     6 surgeries on right ear    Family History  Problem Relation Age of Onset  . Ovarian cancer Mother   . Heart disease Mother   . Hypertension Mother   . Coronary artery disease Mother   . Heart failure Father   . Hypertension Father   . Hyperlipidemia Father   . Diabetes Father   . Breast cancer Maternal Grandmother   . Colon cancer Neg Hx     History  Substance Use Topics  . Smoking status: Never Smoker   . Smokeless tobacco: Never Used  . Alcohol Use: Yes     2/ Day    OB History    Grav Para Term Preterm Abortions TAB SAB Ect Mult Living                  Review of Systems  Constitutional: Negative for fever and chills.  HENT: Negative for congestion and rhinorrhea.   Respiratory: Negative for cough and shortness of breath.   Cardiovascular: Positive for chest pain and palpitations.  Gastrointestinal: Negative for nausea Montel Clock"), vomiting and abdominal  pain.  Musculoskeletal: Negative for back pain.  Skin: Negative for color change and rash.  Neurological: Negative for light-headedness and headaches.  Psychiatric/Behavioral: The patient is nervous/anxious.        Increased stress  All other systems reviewed and are negative.    Allergies  Review of patient's allergies indicates no known allergies.  Home Medications   Current Outpatient Rx  Name Route Sig Dispense Refill  . ALPRAZOLAM 0.5 MG PO TABS Oral Take 0.5 mg by mouth at bedtime as needed. For sleep.    . ARMODAFINIL 250 MG PO TABS Oral Take 250 mg by mouth daily.      . BUPROPION HCL ER (XL) 300 MG PO TB24 Oral Take 300 mg by mouth daily.      . DULOXETINE HCL 60 MG PO CPEP Oral Take 60 mg by mouth daily.      Marland Kitchen RANITIDINE HCL 75 MG PO  TABS Oral Take 150 mg by mouth at bedtime. For acid reflux.    . TRAZODONE HCL 50 MG PO TABS Oral Take 100 mg by mouth at bedtime.    Marland Kitchen ZALEPLON 10 MG PO CAPS Oral Take 20 mg by mouth at bedtime.      BP 119/76  Pulse 80  Temp 98.2 F (36.8 C) (Oral)  Resp 16  SpO2 98%  Physical Exam  Nursing note and vitals reviewed. Constitutional: She is oriented to person, place, and time. She appears well-developed and well-nourished.  HENT:  Head: Normocephalic and atraumatic.  Eyes: Pupils are equal, round, and reactive to light.  Cardiovascular: Normal rate, regular rhythm, normal heart sounds and intact distal pulses.   Pulmonary/Chest: Effort normal and breath sounds normal. No respiratory distress. She exhibits no tenderness.  Abdominal: Soft. She exhibits no distension. There is no tenderness.  Neurological: She is alert and oriented to person, place, and time.  Skin: Skin is warm and dry.  Psychiatric: She has a normal mood and affect.    ED Course  Procedures (including critical care time)  Labs Reviewed  BASIC METABOLIC PANEL - Abnormal; Notable for the following:    GFR calc non Af Amer 71 (*)     GFR calc Af Amer 82 (*)     All other components within normal limits  CBC  POCT I-STAT TROPONIN I   Dg Chest 2 View  08/23/2011  *RADIOLOGY REPORT*  Clinical Data: Chest pain  CHEST - 2 VIEW  Comparison: 06/14/2011  Findings: Cardiomediastinal silhouette is stable.  No acute infiltrate or pleural effusion.  No pulmonary edema.  Bony thorax is stable.  IMPRESSION: No active disease.  No significant change.  Original Report Authenticated By: Natasha Mead, M.D.    Date: 08/23/2011  Rate: 89  Rhythm: normal sinus rhythm  QRS Axis: normal  Intervals: normal  ST/T Wave abnormalities: normal  Conduction Disutrbances:none  Narrative Interpretation: NSR  Old EKG Reviewed: none available     1. Chest pain   2. Anxiety       MDM  This is a 51 year old female who presents here  today with chest pain. She says that over the past 2 days she has had some intermittent episodes of gnawing, aching pain in her left upper chest, which she describes as similar to a pulled muscle. She said at worst of these episodes have been about 5-6/10, and it happened only at work. She says during the episode she felt like her heart was racing slightly, and felt a little "queasy" which she  says she has felt previously when she has had panic attacks. She does state that these episodes are similar to panic attacks that she has had previously, however she has not before had episodes on 2 consecutive days. She says that she resigned from her position at work today, and since then she has felt much better. Her coworkers insisted that she be seen by a physician because of her pain, but her regular doctor advised her to come to the ED. The patient has no medical problems other than anxiety and depression, is a nonsmoker, and does not have any family history of early MI; she says her dad had a heart attack in his 102s, and her mother in her 11s. She is TIMI negative and PERC negative. Her exam is unremarkable. Given her current symptoms is more likely due to anxiety, and do not think is ACS. Basic labs and chest x-ray are unremarkable. Discussed with the patient close followup with her regular doctor, as well as indications to return. She expressed understanding.      Theotis Burrow, MD 08/24/11 [phone]

## 2011-08-30 NOTE — ED Provider Notes (Signed)
I saw and evaluated the patient, reviewed the resident's note and I agree with the findings and plan. I personally reviewed the pt's EKG.  51yF with CP. Etiology unclear, but feel low risk for ACS. EKG nonprovocative. Trop normal. Pain resolved. No respiratory complaints. Doubt PE. CXR clear. RRR w/o murmur. Lungs clear. Lower extremities symmetric as compared to each other. No calf tenderness. Negative Homan's. No palpable cords. Return precautions discussed. Close outpt fu.  Raeford Razor, MD 08/30/11 [phone]

## 2011-09-08 ENCOUNTER — Telehealth: Payer: Self-pay | Admitting: Internal Medicine

## 2011-09-08 NOTE — Telephone Encounter (Signed)
Could not reach earlier.  Couldn't come today.  Appt on Saturday Clinic.

## 2011-09-08 NOTE — Telephone Encounter (Signed)
OV today - any MD Thx

## 2011-09-08 NOTE — Telephone Encounter (Signed)
Caller: Rilynn/Patient; PCP: Illene Regulus; CB#: [phone];  Call regarding sores on chin;  sx started 1 week ago on 09/01/11; looks like impetigo with yellow scabs; pt has had this in the past and would like to have medication called in; antibiotics are usually given to her for these; 2 lesions noted; approx size of an eraser head and the other is a little smaller; no fever;clear drainage noted; yellow scabs; pt has no insurance now; Triaged per Skin Lesions Guideline; See in 24 hr d/t new signs and symptoms of local infection; OFFICE PLEASE REVIEW AND CALL PT BACK WITH REGARDS TO CALLING IN MEDICATION SINCE SHE HAD NO INSURANCE; EXPLAINED TO PT THAT NO ANTIBIOTICS WILL BE CALLED IN; PT REQUESTS TO PLEASE SEND NOTE TO MD;

## 2011-09-09 ENCOUNTER — Ambulatory Visit: Payer: Self-pay | Admitting: Family Medicine

## 2011-09-09 DIAGNOSIS — Z0289 Encounter for other administrative examinations: Secondary | ICD-10-CM

## 2011-10-05 ENCOUNTER — Telehealth: Payer: Self-pay | Admitting: Internal Medicine

## 2011-10-05 ENCOUNTER — Ambulatory Visit (INDEPENDENT_AMBULATORY_CARE_PROVIDER_SITE_OTHER): Payer: Self-pay | Admitting: Internal Medicine

## 2011-10-05 ENCOUNTER — Encounter: Payer: Self-pay | Admitting: Internal Medicine

## 2011-10-05 VITALS — BP 106/80 | HR 92 | Temp 98.8°F | Resp 16 | Wt 115.0 lb

## 2011-10-05 DIAGNOSIS — L02429 Furuncle of limb, unspecified: Secondary | ICD-10-CM

## 2011-10-05 MED ORDER — SULFAMETHOXAZOLE-TRIMETHOPRIM 800-160 MG PO TABS: 1.0000 | ORAL_TABLET | Freq: Two times a day (BID) | ORAL | Status: AC

## 2011-10-05 MED ORDER — ALBUTEROL SULFATE HFA 108 (90 BASE) MCG/ACT IN AERS: 2.0000 | INHALATION_SPRAY | Freq: Four times a day (QID) | RESPIRATORY_TRACT | Status: AC | PRN

## 2011-10-05 MED ORDER — ALBUTEROL SULFATE HFA 108 (90 BASE) MCG/ACT IN AERS: 2.0000 | INHALATION_SPRAY | Freq: Four times a day (QID) | RESPIRATORY_TRACT | Status: DC | PRN

## 2011-10-05 MED ORDER — SULFAMETHOXAZOLE-TRIMETHOPRIM 800-160 MG PO TABS: 1.0000 | ORAL_TABLET | Freq: Two times a day (BID) | ORAL | Status: DC

## 2011-10-05 NOTE — Progress Notes (Signed)
Subjective:    Patient ID: Jordan [name], female    DOB: 31-Oct-1960, 51 y.o.   MRN: [id number]  HPI Jordan [name] presents for evaluation of a boil in the left axilla. She reports an area of increasing induration, erythema, calore and dolore. She incised the lesion using a razor blade at home with the release of a small amount of purulent material. She is now for follow-up. She has had no fever, chills, drainage, enlarged lymph nodes axilla or cervical.  She continues to have intermittent cough problems - she has followed with Dr. Sherene [name].   Past Medical History  Diagnosis Date  . Headache   . Depressive disorder, not elsewhere classified   . Anemia, unspecified   . History of bulimia   . History of anorexia nervosa   . Meningitis, unspecified   . Other specified glaucoma   . Allergic rhinitis due to pollen   . Acute upper respiratory infections of unspecified site   . Diffuse cystic mastopathy   . Lump or mass in breast   . Perforation of tympanic membrane, unspecified   . Anal fissure   . Anxiety disorder   . Fibromyalgia   . IBS (irritable bowel syndrome)   . Seizures   . Radial styloid tenosynovitis   . 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 Complication of anesthesia     wakes up slow   Past Surgical History  Procedure Date  . Dilation and curettage of uterus     History of Menometrorrhagia  . Wrist surgery     for de-quervaine - bilateral wrists - tendonitis  . Ankle surgery     right ankle - scar tissue  . Inner ear surgery     6 surgeries on right ear   Family History  Problem Relation Age of Onset  . Ovarian cancer Mother   . Heart disease Mother   . Hypertension Mother   . Coronary artery disease Mother   . Heart failure Father   . Hypertension Father   . Hyperlipidemia Father   . Diabetes Father   . Breast cancer Maternal Grandmother   . Colon cancer Neg Hx    History   Social History  . Marital Status: Divorced    Spouse Name: N/A    Number of Children: N/A    . Years of Education: N/A   Occupational History  . Not on file.   Social History Main Topics  . Smoking status: Never Smoker   . Smokeless tobacco: Never Used  . Alcohol Use: Yes     2/ Day  . Drug Use: No  . Sexually Active: Not on file   Other Topics Concern  . Not on file   Social History Narrative   [employer], [employer] BA, App for MA. 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 Married 5 years - divorced, no children       Review of Systems System review is negative for any constitutional, cardiac, pulmonary, GI or neuro symptoms or complaints other than as described in the HPI.     Objective:   Physical Exam Filed Vitals:   10/05/11 1633  BP: 106/80  Pulse: 92  Temp: 98.8 F (37.1 C)  Resp: 16   Gen'l- WNWD white woman in no distress Derm - small wound left axilla with mild enduration, w/o fluctuance, w/o drainage.       Assessment &  Plan:  Boil Patient has already incised and drained the lesion. No evidence of secondary infection.  Plan Warm compresses until resolved  For recurrent accumulation of fluctuant material return for aseptic I&D.

## 2011-10-05 NOTE — Telephone Encounter (Signed)
Pt aware of apt.  

## 2011-10-05 NOTE — Telephone Encounter (Signed)
Should have 4 o'clock spot for a quick look - if drainage required will do today but it may be a wait.

## 2011-10-05 NOTE — Patient Instructions (Addendum)
Cyst in the left axilla. Self - incised!!! No need for incision and drainage at this point but there may still be some infection.  Plan   Warm compresses  110-15 minutes at least three times a day  Antibiotics - septra DS twice a day for 10 days.  Tylenol 1,000 mg three times a day.  Abscess An abscess (boil or furuncle) is an infected area that contains a collection of pus.   SYMPTOMS Signs and symptoms of an abscess include pain, tenderness, redness, or hardness. You may feel a moveable soft area under your skin. An abscess can occur anywhere in the body.   TREATMENT   A surgical cut (incision) may be made over your abscess to drain the pus. Gauze may be packed into the space or a drain may be looped through the abscess cavity (pocket). This provides a drain that will allow the cavity to heal from the inside outwards. The abscess may be painful for a few days, but should feel much better if it was drained.   Your abscess, if seen early, may not have localized and may not have been drained. If not, another appointment may be required if it does not get better on its own or with medications. HOME CARE INSTRUCTIONS    Only take over-the-counter or prescription medicines for pain, discomfort, or fever as directed by your caregiver.   Take your antibiotics as directed if they were prescribed. Finish them even if you start to feel better.   Keep the skin and clothes clean around your abscess.   If the abscess was drained, you will need to use gauze dressing to collect any draining pus. Dressings will typically need to be changed 3 or more times a day.   The infection may spread by skin contact with others. Avoid skin contact as much as possible.   Practice good hygiene. This includes regular hand washing, cover any draining skin lesions, and do not share personal care items.   If you participate in sports, do not share athletic equipment, towels, whirlpools, or personal care items. Shower  after every practice or tournament.   If a draining area cannot be adequately covered:   Do not participate in sports.   Children should not participate in day care until the wound has healed or drainage stops.   If your caregiver has given you a follow-up appointment, it is very important to keep that appointment. Not keeping the appointment could result in a much worse infection, chronic or permanent injury, pain, and disability. If there is any problem keeping the appointment, you must call back to this facility for assistance.  SEEK MEDICAL CARE IF:    You develop increased pain, swelling, redness, drainage, or bleeding in the wound site.   You develop signs of generalized infection including muscle aches, chills, fever, or a general ill feeling.   You have an oral temperature above 102 F (38.9 C).  MAKE SURE YOU:    Understand these instructions.   Will watch your condition.   Will get help right away if you are not doing well or get worse.  Document Released: 11/16/2004 Document Revised: 01/26/2011 Document Reviewed: 09/10/2007 [facility] Patient Information 2012 [place], [place].

## 2011-10-08 ENCOUNTER — Encounter: Payer: Self-pay | Admitting: Internal Medicine

## 2011-10-31 ENCOUNTER — Ambulatory Visit: Payer: Self-pay | Admitting: Internal Medicine

## 2011-11-13 ENCOUNTER — Ambulatory Visit: Payer: Self-pay | Admitting: Internal Medicine

## 2011-11-14 ENCOUNTER — Ambulatory Visit: Payer: Self-pay | Admitting: Internal Medicine

## 2011-12-13 ENCOUNTER — Telehealth: Payer: Self-pay | Admitting: *Deleted

## 2011-12-13 NOTE — Telephone Encounter (Signed)
Patient called request refill on flonase nasel spray for allergies, do not see on medication list. Also patient would like consultation with you regarding her pneunomia and respiratory issues she had couple of months ago that has interfered with her job and now has legal issues related to this. Do you need her to make appt. For this.

## 2011-12-13 NOTE — Telephone Encounter (Signed)
Ok for flonase nasal spray.  Needs ov for discussion of issues so that there is full documentation that she will be able to use later.

## 2011-12-14 MED ORDER — FLUTICASONE PROPIONATE 50 MCG/ACT NA SUSP: 1.0000 | Freq: Every day | NASAL | Status: DC

## 2011-12-14 NOTE — Addendum Note (Signed)
Addended by: Elnora Morrison on: 12/14/2011 06:00 PM   Modules accepted: Orders

## 2011-12-14 NOTE — Telephone Encounter (Signed)
Left message on home/cell # of need to schedule appt. With Dr. Debby Bud to discuss problems of work related illness. Medication refill flonase sent to pharmacy

## 2012-01-02 ENCOUNTER — Ambulatory Visit (INDEPENDENT_AMBULATORY_CARE_PROVIDER_SITE_OTHER): Payer: BC Managed Care – PPO | Admitting: Internal Medicine

## 2012-01-02 ENCOUNTER — Other Ambulatory Visit (INDEPENDENT_AMBULATORY_CARE_PROVIDER_SITE_OTHER): Payer: Self-pay

## 2012-01-02 ENCOUNTER — Other Ambulatory Visit (HOSPITAL_COMMUNITY)
Admission: RE | Admit: 2012-01-02 | Discharge: 2012-01-02 | Disposition: A | Discharge status: Home / Self Care | Payer: BC Managed Care – PPO | Source: Ambulatory Visit | Attending: Internal Medicine | Admitting: Internal Medicine

## 2012-01-02 ENCOUNTER — Encounter: Payer: Self-pay | Admitting: Internal Medicine

## 2012-01-02 VITALS — BP 102/80 | HR 82 | Temp 98.2°F | Resp 16 | Wt 115.0 lb

## 2012-01-02 DIAGNOSIS — Z01419 Encounter for gynecological examination (general) (routine) without abnormal findings: Secondary | ICD-10-CM | POA: Insufficient documentation

## 2012-01-02 DIAGNOSIS — R059 Cough, unspecified: Secondary | ICD-10-CM

## 2012-01-02 DIAGNOSIS — Z124 Encounter for screening for malignant neoplasm of cervix: Secondary | ICD-10-CM

## 2012-01-02 DIAGNOSIS — G35 Multiple sclerosis: Secondary | ICD-10-CM

## 2012-01-02 DIAGNOSIS — G3184 Mild cognitive impairment, so stated: Secondary | ICD-10-CM

## 2012-01-02 DIAGNOSIS — H4089 Other specified glaucoma: Secondary | ICD-10-CM

## 2012-01-02 DIAGNOSIS — N6019 Diffuse cystic mastopathy of unspecified breast: Secondary | ICD-10-CM

## 2012-01-02 DIAGNOSIS — R413 Other amnesia: Secondary | ICD-10-CM

## 2012-01-02 DIAGNOSIS — Z23 Encounter for immunization: Secondary | ICD-10-CM

## 2012-01-02 DIAGNOSIS — F329 Major depressive disorder, single episode, unspecified: Secondary | ICD-10-CM

## 2012-01-02 DIAGNOSIS — Z0000 Encounter for general adult medical examination without abnormal findings: Secondary | ICD-10-CM

## 2012-01-02 DIAGNOSIS — F410 Panic disorder [episodic paroxysmal anxiety] without agoraphobia: Secondary | ICD-10-CM

## 2012-01-02 DIAGNOSIS — N3942 Incontinence without sensory awareness: Secondary | ICD-10-CM

## 2012-01-02 DIAGNOSIS — R05 Cough: Secondary | ICD-10-CM

## 2012-01-02 DIAGNOSIS — F3289 Other specified depressive episodes: Secondary | ICD-10-CM

## 2012-01-02 LAB — LIPID PANEL
Cholesterol: 185 mg/dL (ref 0–200)
HDL: 72.9 mg/dL (ref >39.00)
LDL Cholesterol: 96 mg/dL (ref 0–99)
Total CHOL/HDL Ratio: 3
Triglycerides: 79 mg/dL (ref 0.0–149.0)
VLDL: 15.8 mg/dL (ref 0.0–40.0)

## 2012-01-02 NOTE — Patient Instructions (Addendum)
Thanks for coming to see me.   Exam is normal. Reviewed labs from July - no need to repeat. Will check cholesterol today.  Full report to follow  Have a great year in [place]

## 2012-01-02 NOTE — Progress Notes (Signed)
Subjective:    Patient ID: Jordan [name], female    DOB: 1960-07-17, 51 y.o.   MRN: [id number]  HPI Since last visit she did resign from her job and she did apply for [employer] retirement disability and was awarded this 2 weeks ago. Since not working she is doing better - but she still has issues with short-term memory loss, major depressive disorder per Dr. Janace Hoard.  She will be moving to [place] for a year.  The patient is here for annual wellness examination and management of other chronic and acute problems.  She still uses her inhalers as needed. She still has flushing episodes that started when she had pneumonia.  She is taking adderall instead of provigil and doing well.  She is does have cognitive deficits of unknown origin.  She has low back pain over the right sacroiliac region. No radiation of pain, no weakness, no paresthesia.    The risk factors are reflected in the social history.  The roster of all physicians providing medical care to patient - is listed in the Snapshot section of the chart.  Activities of daily living:  The patient is 100% inedpendent in all ADLs: dressing, toileting, feeding as well as independent mobility  Home safety : The patient has smoke detectors in the home. They wear seatbelts. No firearms at home. There is no violence in the home.   There is no risks for hepatitis, STDs or HIV. There is no   history of blood transfusion. They have no travel history to infectious disease endemic areas of the world.  The patient has seen their dentist in the last six month. They have  seen their eye doctor in the last year. They deny any hearing difficulty and have not had audiologic testing in the last year.    They do not  have excessive sun exposure. Discussed the need for sun protection: hats, long sleeves and use of sunscreen if there is significant sun exposure.   Diet: the importance of a healthy diet is discussed. They do have a healthy diet.  The patient  has a regular exercise program: walking/jogging, medicine ball, TaiChi ,  60 min duration,  5per week.  The benefits of regular aerobic exercise were discussed.  Depression screen: Under treatment directed by Psychiatry  Cognitive assessment: the patient manages all their financial but it is a struggle.Has help with personal affairs.  Fairly significant distractibility and memory loss.   Past Medical History  Diagnosis Date  . Headache   . Depressive disorder, not elsewhere classified   . Anemia, unspecified   . History of bulimia   . History of anorexia nervosa   . Meningitis, unspecified   . Other specified glaucoma   . Allergic rhinitis due to pollen   . Acute upper respiratory infections of unspecified site   . Diffuse cystic mastopathy   . Lump or mass in breast   . Perforation of tympanic membrane, unspecified   . Anal fissure   . Anxiety disorder   . Fibromyalgia   . IBS (irritable bowel syndrome)   . Seizures   . Radial styloid tenosynovitis   . 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 Complication of anesthesia     wakes up slow   Past Surgical History  Procedure Date  . Dilation and curettage of uterus     History of Menometrorrhagia  . Wrist surgery     for de-quervaine - bilateral wrists - tendonitis  . Ankle surgery  right ankle - scar tissue  . Inner ear surgery     6 surgeries on right ear   Family History  Problem Relation Age of Onset  . Ovarian cancer Mother   . Heart disease Mother   . Hypertension Mother   . Coronary artery disease Mother   . Heart failure Father   . Hypertension Father   . Hyperlipidemia Father   . Diabetes Father   . Breast cancer Maternal Grandmother   . Colon cancer Neg Hx    History   Social History  . Marital Status: Divorced    Spouse Name: N/A    Number of Children: N/A  . Years of Education: N/A   Occupational History  . Not on file.   Social History Main Topics  . Smoking status: Never Smoker   . Smokeless  tobacco: Never Used  . Alcohol Use: Yes     Comment: 2/ Day  . Drug Use: No  . Sexually Active: Not on file   Other Topics Concern  . Not on file   Social History Narrative   HSG, [employer] BA, App for MA. 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 Married 5 years - divorced, no children    Current Outpatient Prescriptions on File Prior to Visit  Medication Sig Dispense Refill  . albuterol (PROVENTIL HFA;VENTOLIN HFA) 108 (90 BASE) MCG/ACT inhaler Inhale 2 puffs into the lungs every 6 (six) hours as needed for wheezing or shortness of breath.  1 Inhaler  0  . ALPRAZolam (XANAX) 0.5 MG tablet Take 0.5 mg by mouth at bedtime as needed. For sleep.      Marland Kitchen amphetamine-dextroamphetamine (ADDERALL) 20 MG tablet Take 20 mg by mouth. Take 20mg  at 1 AM and 12 noon q day      . buPROPion (WELLBUTRIN XL) 300 MG 24 hr tablet Take 300 mg by mouth daily.        . DULoxetine (CYMBALTA) 60 MG capsule Take 60 mg by mouth daily.        . fluticasone (FLONASE) 50 MCG/ACT nasal spray Place 1 spray into the nose daily.  16 g  3  . ranitidine (ZANTAC) 75 MG tablet Take 150 mg by mouth at bedtime. For acid reflux.      . traZODone (DESYREL) 50 MG tablet Take 100 mg by mouth at bedtime.      . Armodafinil (NUVIGIL) 250 MG tablet Take 250 mg by mouth daily.        . zaleplon (SONATA) 10 MG capsule Take 20 mg by mouth at bedtime.       During the course of the visit the patient was educated and counseled about appropriate screening and preventive services including : fall prevention , diabetes screening, nutrition counseling, colorectal cancer screening, and recommended immunizations.    Review of Systems Constitutional:  Negative for fever, chills, activity change and unexpected weight change.  HEENT:  Negative for increased hearing loss -= life long hearing loss right. Positive for  neck stiffness w/ pain. Has chronic rhinnitis.  Negative for sore throat or swallowing problems. Negative for dental complaints.   Eyes: Negative for vision loss or change in visual acuity.  Respiratory: Positive for chest tightness and wheezing. MIldy positive for DOE.   Cardiovascular: Negative for chest pain. Positive for palpitations. No decreased exercise tolerance Gastrointestinal: No change in bowel habit - chronic irregular. Positive for bloating  or gas, reflux and indigestion. Gets relief with ranitidine. Genitourinary: Positive for urgency, frequency, incontinence.No flank pain and difficulty urinating.  Musculoskeletal: Negative for myalgias, arthralgias and gait problem. Positive for pain at right sacro-iliac joint. Neurological: Negative for dizziness, tremors, weakness and headaches. But does have right hand tremor Hematological: Negative for adenopathy.  Psychiatric/Behavioral: Negative for behavioral problems and dysphoric mood.       Objective:   Physical Exam Filed Vitals:   01/02/12 1400  BP: 102/80  Pulse: 82  Temp: 98.2 F (36.8 C)  Resp: 16   Wt Readings from Last 3 Encounters:  01/02/12 115 lb (52.164 kg)  10/05/11 115 lb (52.164 kg)  07/27/11 117 lb 3.2 oz (53.162 kg)   Gen'l: well nourished, well developed white Woman in no distress HEENT - [place]/AT, EACs/TMs normal, oropharynx with native dentition in good condition, no buccal or palatal lesions, posterior pharynx clear, mucous membranes moist. C&S clear, PERRLA Neck - supple, no thyromegaly Nodes- negative submental, cervical, supraclavicular regions Chest - no deformity, no CVAT Lungs - clear without rales, wheezes. No increased work of breathing Breast - - Skin normal, nipples w/o discharge, no fixed mass or lesion, no axillary adenopathy. Cardiovascular - regular rate and rhythm, quiet precordium, no murmurs, rubs or gallops, 2+ radial, DP and PT pulses Abdomen - BS+ x 4, no HSM, no guarding or rebound or tenderness Pelvic - NEG/BUS nl, vaginal mucosa  normal, posterior fornix w/o discharge, Cervix nulliparous, PAP done, no CMT, no adnexal enlargement Rectal - deferred  Extremities - no clubbing, cyanosis, edema or deformity.  Neuro - A&O x 3, CN II-XII normal, motor strength normal and equal, DTRs 2+ and symmetrical biceps, radial, and patellar tendons. Cerebellar - no tremor, no rigidity, fluid movement and normal gait. Derm - Head, neck, back, abdomen and extremities without suspicious lesions  Lab Results  Component Value Date   WBC 4.9 08/23/2011   HGB 13.7 08/23/2011   HCT 39.1 08/23/2011   PLT 207 08/23/2011   GLUCOSE 76 08/23/2011   CHOL 185 01/02/2012   TRIG 79.0 01/02/2012   HDL 72.90 01/02/2012   LDLCALC 96 01/02/2012   ALT 14 08/08/2009   AST 17 08/08/2009   NA 142 08/23/2011   K 4.0 08/23/2011   CL 100 08/23/2011   CREATININE 0.92 08/23/2011   BUN 12 08/23/2011   CO2 30 08/23/2011   TSH 2.07 02/24/2008   INR 1.1 07/22/2007           Assessment & Plan:

## 2012-01-03 ENCOUNTER — Encounter: Payer: Self-pay | Admitting: Internal Medicine

## 2012-01-03 DIAGNOSIS — G3184 Mild cognitive impairment, so stated: Secondary | ICD-10-CM | POA: Insufficient documentation

## 2012-01-03 DIAGNOSIS — Z0000 Encounter for general adult medical examination without abnormal findings: Secondary | ICD-10-CM | POA: Insufficient documentation

## 2012-01-03 NOTE — Assessment & Plan Note (Signed)
Had severe asthmatic bronchitis vs pneumonia requiring hospitalization April '13 for severe bronchospasm. Subsequently had persistent cough and bronchospasm for several months. Currently she is doing much better but will have occasional cough and episodes of wheezing.

## 2012-01-03 NOTE — Assessment & Plan Note (Signed)
Interval history - made a slow recovery from asthmatic bronchitis/Pneumonia and is doing well. She has not had any major medical flare in the last 6 months. No surgery or injury. Physical exam was normal. Will research her last colonoscopy. Last mammogram Dec '11 and she is due for follow up. Immunization - tetanus is current.  In summary - a nice woman with a complex medical history who is doing OK medically. She is very active physically. She will return as needed.

## 2012-01-03 NOTE — Assessment & Plan Note (Signed)
Appears to be stable and doing better than in the past. She continues under the psychiatric care of Dr. Evelene Croon

## 2012-01-03 NOTE — Assessment & Plan Note (Signed)
Patient with weakness and fatigue but no definitive diagnosis of MS

## 2012-01-03 NOTE — Assessment & Plan Note (Signed)
Patient reports continued difficulty with cognitive function and working memory; difficult to stay on task. She is doing well and has coping mechanisms in place that allow her to remain very functional in ADLs and other activities but she is impaired in regard to working at her profession, especially with full-time work.

## 2012-01-03 NOTE — Assessment & Plan Note (Signed)
Breast exam today with minimal changes.

## 2012-01-03 NOTE — Assessment & Plan Note (Signed)
Current with her ophthalmologist

## 2012-01-03 NOTE — Assessment & Plan Note (Signed)
At today's visit she seems to be doing well. She admits to feeling better now that she is not trying to work a 40 hours week.  Plan  Medication per Dr. Evelene Croon

## 2012-01-03 NOTE — Assessment & Plan Note (Signed)
She has been followed by Dr. Patsi Sears. This continues to be a problem. Her symptoms are not typical for OAB  Plan-  Follow up with Dr. Patsi Sears as needed.

## 2012-01-05 ENCOUNTER — Telehealth: Payer: Self-pay | Admitting: *Deleted

## 2012-01-05 NOTE — Telephone Encounter (Signed)
Called pt and left a detailed vm that PAP was normal.

## 2012-01-24 ENCOUNTER — Ambulatory Visit (INDEPENDENT_AMBULATORY_CARE_PROVIDER_SITE_OTHER): Payer: BC Managed Care – PPO | Admitting: Internal Medicine

## 2012-01-24 ENCOUNTER — Other Ambulatory Visit: Payer: Self-pay | Admitting: Internal Medicine

## 2012-01-24 ENCOUNTER — Telehealth: Payer: Self-pay | Admitting: Internal Medicine

## 2012-01-24 ENCOUNTER — Encounter: Payer: Self-pay | Admitting: Internal Medicine

## 2012-01-24 VITALS — BP 102/82 | HR 86 | Temp 98.6°F | Ht 62.0 in | Wt 117.0 lb

## 2012-01-24 DIAGNOSIS — B029 Zoster without complications: Secondary | ICD-10-CM

## 2012-01-24 DIAGNOSIS — M62830 Muscle spasm of back: Secondary | ICD-10-CM

## 2012-01-24 DIAGNOSIS — M5380 Other specified dorsopathies, site unspecified: Secondary | ICD-10-CM

## 2012-01-24 MED ORDER — GABAPENTIN 100 MG PO CAPS: 100.0000 mg | ORAL_CAPSULE | Freq: Three times a day (TID) | ORAL | Status: DC

## 2012-01-24 MED ORDER — ACYCLOVIR 200 MG PO CAPS: 800.0000 mg | ORAL_CAPSULE | Freq: Every day | ORAL | Status: DC

## 2012-01-24 MED ORDER — PREDNISONE 20 MG PO TABS: 20.0000 mg | ORAL_TABLET | Freq: Every day | ORAL | Status: DC

## 2012-01-24 MED ORDER — CYCLOBENZAPRINE HCL 10 MG PO TABS: 10.0000 mg | ORAL_TABLET | Freq: Three times a day (TID) | ORAL | Status: DC | PRN

## 2012-01-24 NOTE — Patient Instructions (Addendum)

## 2012-01-24 NOTE — Telephone Encounter (Signed)
Patient Information:  Caller Name: Shakyra  Phone: [phone]  Patient: Jordan [name], Jordan [name]  Gender: Female  DOB: [birth year]  Age: 51 Years  PCP: Illene Regulus (Adults only)  Pregnant: No   Symptoms  Reason For Call & Symptoms: Calling about pain, sensitivity and rash to right side.  Reviewed Health History In EMR: Yes  Reviewed Medications In EMR: Yes  Reviewed Allergies In EMR: Yes  Reviewed Surgeries / Procedures: Yes  Date of Onset of Symptoms: 01/21/2012 OB:  LMP: Unknown  Guideline(s) Used:  Rash or Redness - Localized  Disposition Per Guideline:   See Today in Office  Reason For Disposition Reached:   Localized rash is very painful (no fever)  Advice Given:  N/A  Office Follow Up:  Does the office need to follow up with this patient?: No  Instructions For The Office: N/A  Appointment Scheduled:  01/24/2012 11:15:00 Appointment Scheduled Provider:  Nicki Reaper  RN Note:  Has had left side pain for past week; but rash noted 01/23/12 with swelling today.  Is just below waistline.  Rash look like dark pink, flatter than yesterday.  Painful to touch; 8/10 on pain scale. Scheduled for 01/24/12 with Nicki Reaper at 11:15. Dr. Debby Bud schedule is full.

## 2012-01-24 NOTE — Telephone Encounter (Signed)
Reviewed note on Ms. Mortellaro - diagnosed with shingles. Left her a voice message that I usually co-prescribe prednisone 20 mg daily x 10 days  To reduce risk of PHN.  Rx escribed to pharmacy

## 2012-01-24 NOTE — Telephone Encounter (Signed)
R'cd fax from [facility] regarding quantity of Acylovir rx-script sent for 35 capsules incorrect, per Rene Kocher, okay to send for 100 capsules. Rx sent with updated quantity to [facility].

## 2012-01-24 NOTE — Progress Notes (Addendum)
Subjective:    Patient ID: Jordan [name], female    DOB: 05/08/1960, 51 y.o.   MRN: [id number]  HPI  Pt present to the clinic today with c/o a painful rash around her waist. This started yesterday. It is very painful and intermittently itchy. She has not tried to put anything on it, and has not taking anything for the pain. She has never had shingles before but does have a history of chicken pox.  Review of Systems      Past Medical History  Diagnosis Date  . Headache   . Depressive disorder, not elsewhere classified   . Anemia, unspecified   . History of bulimia   . History of anorexia nervosa   . Meningitis, unspecified   . Other specified glaucoma   . Allergic rhinitis due to pollen   . Acute upper respiratory infections of unspecified site   . Diffuse cystic mastopathy   . Lump or mass in breast   . Perforation of tympanic membrane, unspecified   . Anal fissure   . Anxiety disorder   . Fibromyalgia   . IBS (irritable bowel syndrome)   . Seizures   . Radial styloid tenosynovitis   . 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 Complication of anesthesia     wakes up slow    Current Outpatient Prescriptions  Medication Sig Dispense Refill  . albuterol (PROVENTIL HFA;VENTOLIN HFA) 108 (90 BASE) MCG/ACT inhaler Inhale 2 puffs into the lungs every 6 (six) hours as needed for wheezing or shortness of breath.  1 Inhaler  0  . ALPRAZolam (XANAX) 0.5 MG tablet Take 0.5 mg by mouth at bedtime as needed. For sleep.      Marland Kitchen amphetamine-dextroamphetamine (ADDERALL) 20 MG tablet Take 20 mg by mouth. Take 20mg  at 1 AM and 12 noon q day      . Armodafinil (NUVIGIL) 250 MG tablet Take 250 mg by mouth daily.        Marland Kitchen b complex vitamins tablet Take 1 tablet by mouth daily.      Marland Kitchen buPROPion (WELLBUTRIN XL) 300 MG 24 hr tablet Take 300 mg by mouth daily.        . DULoxetine (CYMBALTA) 60 MG capsule Take 60 mg by mouth daily.        . fluticasone (FLONASE) 50 MCG/ACT nasal spray Place 1 spray into the  nose daily.  16 g  3  . folic acid (FOLVITE) 400 MCG tablet Take 400 mcg by mouth daily.      . ranitidine (ZANTAC) 75 MG tablet Take 150 mg by mouth at bedtime. For acid reflux.      . traZODone (DESYREL) 50 MG tablet Take 100 mg by mouth at bedtime.      . zaleplon (SONATA) 10 MG capsule Take 20 mg by mouth at bedtime.        No Known Allergies  Family History  Problem Relation Age of Onset  . Ovarian cancer Mother   . Heart disease Mother   . Hypertension Mother   . Coronary artery disease Mother   . Heart failure Father   . Hypertension Father   . Hyperlipidemia Father   . Diabetes Father   . Breast cancer Maternal Grandmother   . Colon cancer Neg Hx     History   Social History  . Marital Status: Divorced    Spouse Name: N/A    Number of Children: N/A  . Years of Education: N/A   Occupational History  .  Not on file.   Social History Main Topics  . Smoking status: Never Smoker   . Smokeless tobacco: Never Used  . Alcohol Use: Yes     Comment: 2/ Day  . Drug Use: No  . Sexually Active: Not on file   Other Topics Concern  . Not on file   Social History Narrative   HSG, [employer] BA, App for MA. 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 Married 5 years - divorced, no children     Constitutional: Denies fever, malaise, fatigue, headache or abrupt weight changes.  Skin: Pt reports rash around waist. Denies redness, lesions or ulcercations.  Neurological: Pt reports stinging pain near the rash at her waist. Denies dizziness, difficulty with memory, difficulty with speech or problems with balance and coordination.   No other specific complaints in a complete review of systems (except as listed in HPI above).  Objective:   Physical Exam    BP 102/82  Pulse 86  Temp 98.6 F (37 C) (Oral)  Ht 5\' 2"  (1.575 m)  Wt 117 lb (53.071 kg)  BMI 21.40 kg/m2  SpO2 98% Wt Readings from  Last 3 Encounters:  01/24/12 117 lb (53.071 kg)  01/02/12 115 lb (52.164 kg)  10/05/11 115 lb (52.164 kg)    General: Appears her stated age, well developed, well nourished in NAD. Skin: Pt with erythematous lesions that are painful to touch and itching extending from left hip to left anterior groin.  Cardiovascular: Normal rate and rhythm. S1,S2 noted.  No murmur, rubs or gallops noted. No JVD or BLE edema. No carotid bruits noted. Pulmonary/Chest: Normal effort and positive vesicular breath sounds. No respiratory distress. No wheezes, rales or ronchi noted.  Neurological: Alert and oriented. Cranial nerves II-XII intact. Coordination normal. +DTRs bilaterally.   BMET    Component Value Date/Time   NA 142 08/23/2011 1657   K 4.0 08/23/2011 1657   CL 100 08/23/2011 1657   CO2 30 08/23/2011 1657   GLUCOSE 76 08/23/2011 1657   BUN 12 08/23/2011 1657   CREATININE 0.92 08/23/2011 1657   CALCIUM 9.5 08/23/2011 1657   GFRNONAA 71* 08/23/2011 1657   GFRAA 82* 08/23/2011 1657    Lipid Panel     Component Value Date/Time   CHOL 185 01/02/2012 1503   TRIG 79.0 01/02/2012 1503   HDL 72.90 01/02/2012 1503   CHOLHDL 3 01/02/2012 1503   VLDL 15.8 01/02/2012 1503   LDLCALC 96 01/02/2012 1503    CBC    Component Value Date/Time   WBC 4.9 08/23/2011 1657   RBC 4.17 08/23/2011 1657   HGB 13.7 08/23/2011 1657   HCT 39.1 08/23/2011 1657   PLT 207 08/23/2011 1657   MCV 93.8 08/23/2011 1657   MCH 32.9 08/23/2011 1657   MCHC 35.0 08/23/2011 1657   RDW 12.3 08/23/2011 1657   LYMPHSABS 2.2 06/15/2011 2004   MONOABS 0.7 06/15/2011 2004   EOSABS 0.3 06/15/2011 2004   BASOSABS 0.0 06/15/2011 2004    Hgb A1C No results found for this basename: HGBA1C       Assessment & Plan:   Rash, due to shingles, new onset with additional workup required:  eRx given for valtrex eRx given for neurontin 100 mg TID Should get zostavax when this clears  Muscle spasms, new onset with additional workup required:  eRx given for  flexeril  RTC as needed or if  symptoms persist

## 2012-01-25 ENCOUNTER — Other Ambulatory Visit: Payer: Self-pay | Admitting: Internal Medicine

## 2012-01-25 ENCOUNTER — Telehealth: Payer: Self-pay | Admitting: *Deleted

## 2012-01-25 MED ORDER — HYDROCODONE-ACETAMINOPHEN 7.5-325 MG PO TABS: 1.0000 | ORAL_TABLET | Freq: Four times a day (QID) | ORAL | Status: DC | PRN

## 2012-01-25 MED ORDER — GABAPENTIN 600 MG PO TABS: 600.0000 mg | ORAL_TABLET | Freq: Three times a day (TID) | ORAL | Status: DC

## 2012-01-25 MED ORDER — TRAMADOL HCL 50 MG PO TABS: 50.0000 mg | ORAL_TABLET | Freq: Three times a day (TID) | ORAL | Status: DC | PRN

## 2012-01-25 NOTE — Telephone Encounter (Signed)
Called pt per Dr. Debby Bud. Told her that she has an new rx for oxycodone-acetominophen for the pain at her pharmacy. Also, a new rx for gabapentin 600 mg. In the mean time he advised she increase her gabapentin to: 2 pills (100 mg ea) 3 x a day for 2 days, then 3 pills (100 mg ea) 3 x a day for 2 days. Then begin taking the new rx of gabapentin 600 mg 3 x a day. Any further problems or questions, please call us back. Pt understands.

## 2012-01-25 NOTE — Addendum Note (Signed)
Addended by: Lorre Munroe on: 01/25/2012 08:30 AM   Modules accepted: Orders

## 2012-03-04 ENCOUNTER — Encounter: Payer: Self-pay | Admitting: Internal Medicine

## 2012-03-04 ENCOUNTER — Other Ambulatory Visit: Payer: Self-pay | Admitting: Internal Medicine

## 2012-03-04 ENCOUNTER — Other Ambulatory Visit (INDEPENDENT_AMBULATORY_CARE_PROVIDER_SITE_OTHER): Payer: Self-pay

## 2012-03-04 DIAGNOSIS — B029 Zoster without complications: Secondary | ICD-10-CM

## 2012-03-04 MED ORDER — RANITIDINE HCL 75 MG PO TABS: 150.0000 mg | ORAL_TABLET | Freq: Every day | ORAL | Status: DC

## 2012-03-05 ENCOUNTER — Encounter: Payer: Self-pay | Admitting: Internal Medicine

## 2012-03-06 ENCOUNTER — Telehealth: Payer: Self-pay | Admitting: Internal Medicine

## 2012-03-06 MED ORDER — HYDROCODONE-ACETAMINOPHEN 7.5-325 MG PO TABS: 1.0000 | ORAL_TABLET | Freq: Four times a day (QID) | ORAL | Status: DC | PRN

## 2012-03-06 NOTE — Telephone Encounter (Signed)
Pt couldn't come today.  She has an appt Thurs at 9:00.

## 2012-03-06 NOTE — Telephone Encounter (Signed)
OV this PM to evaluate rash

## 2012-03-06 NOTE — Telephone Encounter (Signed)
FYI:  From a patient my chart e-mail.

## 2012-03-06 NOTE — Telephone Encounter (Signed)
Think I have shingles now on my face. Just not sure but have a similar rash. Doesn't look like acne....on other side. Still having back pain and right side pain from earlier area. Had emailed for a refill of hydrocodonemeds It was not forwarded to him but to Ms. Fredric Mare. Need to see and or talk with Dr. Arthur Holms as he changed my medications after Ms Bailey's initial thoughts. Thank you and please forward this to Dr. Verdell Face

## 2012-03-07 ENCOUNTER — Ambulatory Visit: Payer: Self-pay | Admitting: Internal Medicine

## 2012-03-07 ENCOUNTER — Encounter: Payer: Self-pay | Admitting: Internal Medicine

## 2012-03-07 DIAGNOSIS — Z0289 Encounter for other administrative examinations: Secondary | ICD-10-CM

## 2012-03-13 ENCOUNTER — Ambulatory Visit (INDEPENDENT_AMBULATORY_CARE_PROVIDER_SITE_OTHER): Payer: BC Managed Care – PPO | Admitting: Internal Medicine

## 2012-03-13 ENCOUNTER — Encounter: Payer: Self-pay | Admitting: Internal Medicine

## 2012-03-13 VITALS — BP 104/66 | HR 84 | Temp 97.2°F | Resp 10 | Wt 121.0 lb

## 2012-03-13 DIAGNOSIS — F329 Major depressive disorder, single episode, unspecified: Secondary | ICD-10-CM

## 2012-03-13 DIAGNOSIS — Z0000 Encounter for general adult medical examination without abnormal findings: Secondary | ICD-10-CM

## 2012-03-13 DIAGNOSIS — K319 Disease of stomach and duodenum, unspecified: Secondary | ICD-10-CM

## 2012-03-13 DIAGNOSIS — R21 Rash and other nonspecific skin eruption: Secondary | ICD-10-CM

## 2012-03-13 DIAGNOSIS — K3189 Other diseases of stomach and duodenum: Secondary | ICD-10-CM

## 2012-03-13 DIAGNOSIS — F3289 Other specified depressive episodes: Secondary | ICD-10-CM

## 2012-03-13 DIAGNOSIS — R1013 Epigastric pain: Secondary | ICD-10-CM

## 2012-03-13 MED ORDER — RANITIDINE HCL 150 MG PO TABS: 150.0000 mg | ORAL_TABLET | Freq: Two times a day (BID) | ORAL | Status: DC

## 2012-03-13 MED ORDER — GABAPENTIN 600 MG PO TABS: 600.0000 mg | ORAL_TABLET | Freq: Three times a day (TID) | ORAL | Status: DC

## 2012-03-13 MED ORDER — HYDROCODONE-ACETAMINOPHEN 7.5-325 MG PO TABS: 1.0000 | ORAL_TABLET | Freq: Four times a day (QID) | ORAL | Status: DC | PRN

## 2012-03-13 MED ORDER — AMPHETAMINE-DEXTROAMPHETAMINE 20 MG PO TABS: 20.0000 mg | ORAL_TABLET | ORAL | Status: DC

## 2012-03-13 NOTE — Telephone Encounter (Signed)
No refill at this date. Will be seeing patient today.

## 2012-03-13 NOTE — Patient Instructions (Addendum)
Facial rash - right side of the face - may be shingles. At this point it is to late for any anti-viral medication. Plan Wash twice a day with soap and water  No make up as much as possible  Gabapentin 600 mg three times a day as long as there is pain from the rash  Chronic pain issues - refill on the hydrocodone/APAP 7.5/325 every 6 hours as needed.  For reflux and acid - Ranitidine 150 mg twice a day.

## 2012-03-16 DIAGNOSIS — K319 Disease of stomach and duodenum, unspecified: Secondary | ICD-10-CM | POA: Insufficient documentation

## 2012-03-16 NOTE — Progress Notes (Signed)
Subjective:    Patient ID: Jordan [name], female    DOB: 1961-02-19, 52 y.o.   MRN: [id number]  HPI Jordan [name] present for follow up of facial rash. She recently saw Ms. Baity for a rash diagnosed as shingles. The rash was truncal - around the waist. She was treated with acyclovir and gabapentin 100 mg tid. She did have resolution of the rash. Several days ago she thought she had a recurrence of shingles on the right face. Messages went back and forth involving Ms. Baity and myself. She was asked to present to the office for urgent evaluation but was unable to come in before today. The facial rash has stabilized but is still a little painful.  There has been increased stress in her life: a childhood friend is dying with cancer; her disability application was denied. She admits to deepening depression that has lead to withdrawal. She is thankful to have a puppy which requires that she get out of bed to feed and walk her pet. She denies suicidal ideation.  PMH, FamHx and SocHx reviewed for any changes and relevance. Current Outpatient Prescriptions on File Prior to Visit  Medication Sig Dispense Refill  . albuterol (PROVENTIL HFA;VENTOLIN HFA) 108 (90 BASE) MCG/ACT inhaler Inhale 2 puffs into the lungs every 6 (six) hours as needed for wheezing or shortness of breath.  1 Inhaler  0  . ALPRAZolam (XANAX) 0.5 MG tablet Take 0.5 mg by mouth at bedtime as needed. For sleep.      Marland Kitchen amphetamine-dextroamphetamine (ADDERALL) 20 MG tablet Take 1 tablet (20 mg total) by mouth as directed. Take 20mg  at 1 AM and 10 mg in PM      . b complex vitamins tablet Take 1 tablet by mouth daily.      . cyclobenzaprine (FLEXERIL) 10 MG tablet Take 1 tablet (10 mg total) by mouth 3 (three) times daily as needed for muscle spasms.  30 tablet  0  . DULoxetine (CYMBALTA) 60 MG capsule Take 90 mg by mouth daily.       . fluticasone (FLONASE) 50 MCG/ACT nasal spray Place 1 spray into the nose daily.  16 g  3  . folic acid  (FOLVITE) 400 MCG tablet Take 400 mcg by mouth daily.      . traZODone (DESYREL) 50 MG tablet Take 100 mg by mouth at bedtime.      Marland Kitchen buPROPion (WELLBUTRIN XL) 300 MG 24 hr tablet Take 300 mg by mouth daily.             Review of Systems System review is negative for any constitutional, cardiac, pulmonary, GI or neuro symptoms or complaints other than as described in the HPI.     Objective:   Physical Exam Filed Vitals:   03/13/12 1635  BP: 104/66  Pulse: 84  Temp: 97.2 F (36.2 C)  Resp: 10   Cor- RRR Pulm - normal respirations Derm - erythematous rash with what may be resolving vesicles on the right cheek. There is no involvement of the periorbital area or eyelids. There are fewer scattered lesions on the chin and left face. No secondary infection Psych - awake and alert, clear and rational speech.        Assessment & Plan:  1. Facial rash - not classic but may be resolving shingles outbreak. The window for using antiviral medication is closed.  Plan Wash twice a day with soap and water  No make up as much as possible  Gabapentin 600  mg three times a day as long as there is pain from the rash

## 2012-03-16 NOTE — Assessment & Plan Note (Signed)
For dyspepsia - continue H2 blocker therapy twice a day.

## 2012-03-16 NOTE — Assessment & Plan Note (Signed)
Chronic problem with recent exacerbation. She does not appear to be in danger at this visit.  Plan - continue present medications  See Dr. Evelene Croon as instructed, sooner if you feel worse.

## 2012-03-19 ENCOUNTER — Encounter: Payer: Self-pay | Admitting: Internal Medicine

## 2012-03-20 MED ORDER — DOXYCYCLINE HYCLATE 100 MG PO TABS: 100.0000 mg | ORAL_TABLET | Freq: Two times a day (BID) | ORAL | Status: DC

## 2012-03-24 ENCOUNTER — Encounter: Payer: Self-pay | Admitting: Internal Medicine

## 2012-04-02 ENCOUNTER — Ambulatory Visit: Payer: Self-pay | Admitting: Psychiatry

## 2012-04-10 ENCOUNTER — Other Ambulatory Visit: Payer: Self-pay | Admitting: Internal Medicine

## 2012-04-10 DIAGNOSIS — Z1231 Encounter for screening mammogram for malignant neoplasm of breast: Secondary | ICD-10-CM

## 2012-04-11 ENCOUNTER — Encounter: Payer: Self-pay | Admitting: Internal Medicine

## 2012-04-16 ENCOUNTER — Ambulatory Visit (INDEPENDENT_AMBULATORY_CARE_PROVIDER_SITE_OTHER): Payer: BC Managed Care – PPO | Admitting: Psychiatry

## 2012-04-16 DIAGNOSIS — F988 Other specified behavioral and emotional disorders with onset usually occurring in childhood and adolescence: Secondary | ICD-10-CM

## 2012-04-16 DIAGNOSIS — F331 Major depressive disorder, recurrent, moderate: Secondary | ICD-10-CM

## 2012-04-18 ENCOUNTER — Ambulatory Visit: Payer: Self-pay | Admitting: Psychiatry

## 2012-04-24 ENCOUNTER — Ambulatory Visit (INDEPENDENT_AMBULATORY_CARE_PROVIDER_SITE_OTHER): Payer: BC Managed Care – PPO | Admitting: Psychiatry

## 2012-04-24 DIAGNOSIS — F331 Major depressive disorder, recurrent, moderate: Secondary | ICD-10-CM

## 2012-04-24 DIAGNOSIS — F988 Other specified behavioral and emotional disorders with onset usually occurring in childhood and adolescence: Secondary | ICD-10-CM

## 2012-05-01 ENCOUNTER — Ambulatory Visit (INDEPENDENT_AMBULATORY_CARE_PROVIDER_SITE_OTHER): Payer: BC Managed Care – PPO | Admitting: Psychiatry

## 2012-05-01 DIAGNOSIS — F331 Major depressive disorder, recurrent, moderate: Secondary | ICD-10-CM

## 2012-05-01 DIAGNOSIS — F988 Other specified behavioral and emotional disorders with onset usually occurring in childhood and adolescence: Secondary | ICD-10-CM

## 2012-05-06 ENCOUNTER — Ambulatory Visit: Payer: Self-pay

## 2012-05-06 ENCOUNTER — Telehealth: Payer: Self-pay | Admitting: Internal Medicine

## 2012-05-06 NOTE — Telephone Encounter (Signed)
[facility] Triage Call Report Triage Record Num: [id number] Operator: Baldomero Lamy Patient Name: Jordan [name] Call Date & Time: 05/05/2012 10:37:21AM Patient Phone: [phone] PCP: Illene Regulus Patient Gender: Female PCP Fax : [phone] Patient DOB: 06/19/1960 Practice Name: [facility] Reason for Call: Caller: Tandra/Patient; PCP: Illene Regulus (Adults only); CB#: [phone]; Pt calling requesting Xanax for panic attack. Pt states she is having rapid heartbeat and worsening feelings of depression with diarrhea. Has been very busy with family "taking care of others" and has not been able to sleep the last 2 night b/c of Fibromyalgia pain. Has Norco on hand but does not wish to take it for pain as it speeds her up instead of slowing her down. Has taken other meds as prescribed today. Pt denies any suicidal or homicidal ideations. Emergent sx of Depression: Recurring worsening symptoms AND hx of suicide or homicide attempts or self destructive behavior. Spoke with Dr. Patsy Lager. Ok to call in #3 Xanax, 0.5 mg 1 po hs prn for sleep. Called in to [employer] [phone] message line. Strict call back parameters and care advice given. PT agreed to f/u with office and Psychiatrist on 05/06/12. Protocol(s) Used: Anxiety: Panic Protocol(s) Used: Depression Recommended Outcome per Protocol: See ED Immediately Override Outcome if Used in Protocol: Call Provider within 24 Hours RN Reason for Override Outcome: Physician Orders. Reason for Outcome: Describing symptoms of depression Recurring or worsening symptoms AND history of suicide or homicide attempt(s) or self-destructive behavior Care Advice: ~ Another adult should drive. ~ Minimize stimulation in environment. ~ Call EMS 911 if behavior is threatening or becomes harmful to others or self. ~ Talk with person but do not be judgmental; be supportive and offer hope and encouragement. Avoid the use of stimulants including  caffeine (coffee, some soft drinks, some energy drinks, tea and chocolate), cocaine, and amphetamines. Also avoid drinking alcohol. ~ IMMEDIATE ACTION Write down provider's name. List or place the following in a bag for transport with the patient: current prescription and/or nonprescription medications; alternative treatments, therapies and medications; and street drugs.

## 2012-05-06 NOTE — Telephone Encounter (Signed)
Sounds like she needs an OV - tomorrow should be soon enough unless she sounds upset or agitated when you call with appointment

## 2012-05-06 NOTE — Telephone Encounter (Signed)
Left message for pt to call back and schedule

## 2012-05-07 NOTE — Telephone Encounter (Signed)
Pt scheduled 05/08/12 with Dr. Debby Bud

## 2012-05-08 ENCOUNTER — Encounter: Payer: Self-pay | Admitting: Internal Medicine

## 2012-05-08 ENCOUNTER — Ambulatory Visit (INDEPENDENT_AMBULATORY_CARE_PROVIDER_SITE_OTHER): Payer: BC Managed Care – PPO | Admitting: Internal Medicine

## 2012-05-08 VITALS — BP 130/86 | HR 82 | Temp 99.1°F | Resp 8 | Ht 62.0 in | Wt 123.6 lb

## 2012-05-08 DIAGNOSIS — F410 Panic disorder [episodic paroxysmal anxiety] without agoraphobia: Secondary | ICD-10-CM

## 2012-05-08 DIAGNOSIS — F3289 Other specified depressive episodes: Secondary | ICD-10-CM

## 2012-05-08 DIAGNOSIS — M542 Cervicalgia: Secondary | ICD-10-CM

## 2012-05-08 DIAGNOSIS — F329 Major depressive disorder, single episode, unspecified: Secondary | ICD-10-CM

## 2012-05-08 MED ORDER — MELOXICAM 15 MG PO TABS: 15.0000 mg | ORAL_TABLET | Freq: Every day | ORAL | Status: DC

## 2012-05-08 NOTE — Progress Notes (Signed)
Subjective:    Patient ID: Jordan [name], female    DOB: [birth year], 52 y.o.   MRN: [id number]  HPI [name] presents due to increased pain in the right neck radiating to the trapezius group, the shoulder, scapula and radiating down the right arm. She reports that she will develop a dense paresthesia radiating to right arm and hand over night. There has been a diagnosis of thoracic outlet syndrome made at [facility] in the past (2011). She also c/o point pain in the right triceps at mid-proximaly location.  She has had significant depression and on-going anxiety. She has continued her medications per Dr. Evelene Croon but is not currently in counseling.  Past Medical History  Diagnosis Date  . Headache   . Depressive disorder, not elsewhere classified   . Anemia, unspecified   . History of bulimia   . History of anorexia nervosa   . Meningitis, unspecified   . Other specified glaucoma   . Allergic rhinitis due to pollen   . Acute upper respiratory infections of unspecified site   . Diffuse cystic mastopathy   . Lump or mass in breast   . Perforation of tympanic membrane, unspecified   . Anal fissure   . Anxiety disorder   . Fibromyalgia   . IBS (irritable bowel syndrome)   . Seizures   . Radial styloid tenosynovitis   . 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 Complication of anesthesia     wakes up slow   Past Surgical History  Procedure Laterality Date  . Dilation and curettage of uterus      History of Menometrorrhagia  . Wrist surgery      for de-quervaine - bilateral wrists - tendonitis  . Ankle surgery      right ankle - scar tissue  . Inner ear surgery      6 surgeries on right ear   Family History  Problem Relation Age of Onset  . Ovarian cancer Mother   . Heart disease Mother   . Hypertension Mother   . Coronary artery disease Mother   . Heart failure Father   . Hypertension Father   . Hyperlipidemia Father   . Diabetes Father   . Breast cancer Maternal Grandmother   . Colon  cancer Neg Hx    History   Social History  . Marital Status: Divorced    Spouse Name: N/A    Number of Children: N/A  . Years of Education: N/A   Occupational History  . Not on file.   Social History Main Topics  . Smoking status: Never Smoker   . Smokeless tobacco: Never Used  . Alcohol Use: Yes     Comment: 2/ Day  . Drug Use: No  . Sexually Active: Not on file   Other Topics Concern  . Not on file   Social History Narrative   HSG, [employer] BA, App for MA. 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 Married 5 years - divorced, no children                   Current Outpatient Prescriptions on File Prior to Visit  Medication Sig Dispense Refill  . albuterol (PROVENTIL HFA;VENTOLIN HFA) 108 (90 BASE) MCG/ACT inhaler Inhale 2 puffs into the lungs every 6 (six) hours as needed for wheezing or shortness of breath.  1 Inhaler  0  . ALPRAZolam (XANAX) 0.5  MG tablet Take 0.5 mg by mouth at bedtime as needed. For sleep.      Marland Kitchen amphetamine-dextroamphetamine (ADDERALL) 20 MG tablet Take 1 tablet (20 mg total) by mouth as directed. Take 20mg  at 1 AM and 10 mg in PM      . b complex vitamins tablet Take 1 tablet by mouth daily.      Marland Kitchen buPROPion (WELLBUTRIN XL) 300 MG 24 hr tablet Take 150 mg by mouth daily.       . cyclobenzaprine (FLEXERIL) 10 MG tablet Take 1 tablet (10 mg total) by mouth 3 (three) times daily as needed for muscle spasms.  30 tablet  0  . doxycycline (VIBRA-TABS) 100 MG tablet Take 1 tablet (100 mg total) by mouth 2 (two) times daily.  20 tablet  1  . DULoxetine (CYMBALTA) 60 MG capsule Take 90 mg by mouth daily.       . fluticasone (FLONASE) 50 MCG/ACT nasal spray Place 1 spray into the nose daily.  16 g  3  . folic acid (FOLVITE) 400 MCG tablet Take 400 mcg by mouth daily.      Marland Kitchen gabapentin (NEURONTIN) 600 MG tablet Take 1 tablet (600 mg total) by mouth 3 (three) times daily.  90  tablet  5  . ranitidine (ZANTAC) 150 MG tablet Take 1 tablet (150 mg total) by mouth 2 (two) times daily. For acid reflux.  60 tablet  11  . traZODone (DESYREL) 50 MG tablet Take 100 mg by mouth at bedtime.       No current facility-administered medications on file prior to visit.      Review of Systems System review is negative for any constitutional, cardiac, pulmonary, GI or neuro symptoms or complaints other than as described in the HPI.     Objective:   Physical Exam Filed Vitals:   05/08/12 1633  BP: 130/86  Pulse: 82  Temp: 99.1 F (37.3 C)  Resp: 8   Wt Readings from Last 3 Encounters:  05/08/12 123 lb 9.6 oz (56.065 kg)  03/13/12 121 lb (54.885 kg)  01/24/12 117 lb (53.071 kg)   General - WNWD white woman who is not in distress HEENT - C&S clear Cor - RRR Pulm - normal respirations Neuro - A&O x 3, CN II-XII normal MSK point tenderness right trapezius group; point tenderness right triceps.  Procedure Trigger point injection(s)  Indication - localized pain: right trapezius; right triceps. Consent - informed verbal consent from patient after explanation of risks of bleeding and infection Prep - injection sites identified, prepped with betadine followed by alcohol. Injection -   1) trapezius group right: 40 Mg depomedrol with 0.5Cc 2% xylocain w/ epi  Injected to trigger point using #25g 3/4 " needle         2) right tricep - belly of muscle trigger point: 40 mg Depomedrol with 0.5cc 2% xylocain w/ epi using #25g 3/4 " needle Post-procedure - patient with rapid reduction in discomfort. Bandaid applied. Routine precautions provided including instruction to return for fever, drainage or increased pain        Assessment & Plan:  Point pain right triceps  Plan Trigger point injection

## 2012-05-08 NOTE — Patient Instructions (Addendum)
Anxiety and depression - really need to defer to Dr. Evelene Croon and [name]  Pain syndrome shoulder and right upper extremity - seems to be a recurrence of problems you had in 2011-2012. Trigger point injections today may help. Plan Resume gabapentin 600 mg three times a day. Start once a day x 3 , twice a day x 3 and then three times a day  Need to investigate the issue of thoracic outlet syndrome. If you can get records from [facility] that would be great.  Also , start NSAID = meloxicam 15 mg once a day. Also start taking Zantac twice a day for GI protection. Watch for symptoms

## 2012-05-09 ENCOUNTER — Ambulatory Visit: Payer: BC Managed Care – PPO | Admitting: Psychiatry

## 2012-05-12 MED ORDER — METHYLPREDNISOLONE ACETATE 40 MG/ML IJ SUSP: 40.0000 mg | Freq: Once | INTRAMUSCULAR | Status: DC

## 2012-05-12 NOTE — Assessment & Plan Note (Signed)
She reports recent bout of deep depression which is lifted somewhat at today's visit. She denies any suicidal ideation.  Plan Continued medical management per Dr. Evelene Croon and she is encouraged to have continued counseling.

## 2012-05-12 NOTE — Assessment & Plan Note (Signed)
Continued symptoms despite complex medical regimen that is directed by Dr. Evelene Croon

## 2012-05-12 NOTE — Assessment & Plan Note (Signed)
Recurrent pain at the right trapezius with trigger point tenderness.  Plan Trigger point injection performed using depomedrol 40 mg

## 2012-05-13 ENCOUNTER — Encounter: Payer: Self-pay | Admitting: Internal Medicine

## 2012-05-15 ENCOUNTER — Ambulatory Visit: Payer: BC Managed Care – PPO | Admitting: Psychiatry

## 2012-05-16 ENCOUNTER — Other Ambulatory Visit: Payer: Self-pay | Admitting: Internal Medicine

## 2012-05-16 ENCOUNTER — Encounter: Payer: Self-pay | Admitting: Internal Medicine

## 2012-05-22 ENCOUNTER — Ambulatory Visit (INDEPENDENT_AMBULATORY_CARE_PROVIDER_SITE_OTHER): Payer: BC Managed Care – PPO | Admitting: Psychiatry

## 2012-05-22 DIAGNOSIS — Z638 Other specified problems related to primary support group: Secondary | ICD-10-CM

## 2012-05-22 DIAGNOSIS — F0630 Mood disorder due to known physiological condition, unspecified: Secondary | ICD-10-CM

## 2012-06-04 ENCOUNTER — Ambulatory Visit (INDEPENDENT_AMBULATORY_CARE_PROVIDER_SITE_OTHER): Payer: BC Managed Care – PPO | Admitting: Psychiatry

## 2012-06-04 DIAGNOSIS — F331 Major depressive disorder, recurrent, moderate: Secondary | ICD-10-CM

## 2012-06-04 DIAGNOSIS — F988 Other specified behavioral and emotional disorders with onset usually occurring in childhood and adolescence: Secondary | ICD-10-CM

## 2012-06-18 ENCOUNTER — Ambulatory Visit: Payer: BC Managed Care – PPO | Admitting: Psychiatry

## 2012-06-20 ENCOUNTER — Ambulatory Visit (INDEPENDENT_AMBULATORY_CARE_PROVIDER_SITE_OTHER): Payer: BC Managed Care – PPO | Admitting: Psychiatry

## 2012-06-20 DIAGNOSIS — F988 Other specified behavioral and emotional disorders with onset usually occurring in childhood and adolescence: Secondary | ICD-10-CM

## 2012-06-20 DIAGNOSIS — F331 Major depressive disorder, recurrent, moderate: Secondary | ICD-10-CM

## 2012-06-26 ENCOUNTER — Ambulatory Visit (INDEPENDENT_AMBULATORY_CARE_PROVIDER_SITE_OTHER): Payer: BC Managed Care – PPO | Admitting: Psychiatry

## 2012-06-26 DIAGNOSIS — F331 Major depressive disorder, recurrent, moderate: Secondary | ICD-10-CM

## 2012-06-26 DIAGNOSIS — F988 Other specified behavioral and emotional disorders with onset usually occurring in childhood and adolescence: Secondary | ICD-10-CM

## 2012-07-03 ENCOUNTER — Ambulatory Visit: Payer: BC Managed Care – PPO | Admitting: Psychiatry

## 2012-07-04 ENCOUNTER — Ambulatory Visit: Payer: BC Managed Care – PPO | Admitting: Psychiatry

## 2012-07-09 ENCOUNTER — Ambulatory Visit: Payer: BC Managed Care – PPO | Admitting: Psychiatry

## 2012-07-11 ENCOUNTER — Ambulatory Visit (INDEPENDENT_AMBULATORY_CARE_PROVIDER_SITE_OTHER): Payer: BC Managed Care – PPO | Admitting: Psychiatry

## 2012-07-11 DIAGNOSIS — F988 Other specified behavioral and emotional disorders with onset usually occurring in childhood and adolescence: Secondary | ICD-10-CM

## 2012-07-11 DIAGNOSIS — F331 Major depressive disorder, recurrent, moderate: Secondary | ICD-10-CM

## 2012-07-12 ENCOUNTER — Encounter: Payer: Self-pay | Admitting: Internal Medicine

## 2012-07-17 ENCOUNTER — Ambulatory Visit (INDEPENDENT_AMBULATORY_CARE_PROVIDER_SITE_OTHER): Payer: BC Managed Care – PPO | Admitting: Psychiatry

## 2012-07-17 DIAGNOSIS — F988 Other specified behavioral and emotional disorders with onset usually occurring in childhood and adolescence: Secondary | ICD-10-CM

## 2012-07-17 DIAGNOSIS — F331 Major depressive disorder, recurrent, moderate: Secondary | ICD-10-CM

## 2012-07-19 ENCOUNTER — Encounter: Payer: Self-pay | Admitting: Internal Medicine

## 2012-07-22 ENCOUNTER — Telehealth: Payer: Self-pay

## 2012-07-22 MED ORDER — PREDNISONE 10 MG PO TABS: 10.0000 mg | ORAL_TABLET | Freq: Every day | ORAL | Status: DC

## 2012-07-22 NOTE — Telephone Encounter (Signed)
Rx for prednsione sent to pharmacy

## 2012-07-22 NOTE — Telephone Encounter (Signed)
Phone call from patient, requesting a prescription because she has poison ivy on her legs and arms.

## 2012-07-23 ENCOUNTER — Ambulatory Visit (INDEPENDENT_AMBULATORY_CARE_PROVIDER_SITE_OTHER): Payer: BC Managed Care – PPO | Admitting: Psychiatry

## 2012-07-23 DIAGNOSIS — F331 Major depressive disorder, recurrent, moderate: Secondary | ICD-10-CM

## 2012-07-23 DIAGNOSIS — F988 Other specified behavioral and emotional disorders with onset usually occurring in childhood and adolescence: Secondary | ICD-10-CM

## 2012-07-23 NOTE — Telephone Encounter (Signed)
Pt.notified

## 2012-07-24 ENCOUNTER — Telehealth: Payer: Self-pay

## 2012-07-24 MED ORDER — PREDNISONE 10 MG PO TABS
10.0000 mg | ORAL_TABLET | Freq: Every day | ORAL | Status: DC
Start: 2012-07-24 — End: 2012-08-29

## 2012-07-24 NOTE — Telephone Encounter (Signed)
Patient called and stated her prescription for prednisone needs to go to [facility] [address] since [facility] her ins wont cover it there

## 2012-07-29 ENCOUNTER — Encounter: Payer: Self-pay | Admitting: Internal Medicine

## 2012-07-29 ENCOUNTER — Ambulatory Visit (INDEPENDENT_AMBULATORY_CARE_PROVIDER_SITE_OTHER): Payer: BC Managed Care – PPO | Admitting: Internal Medicine

## 2012-07-29 VITALS — BP 140/90 | HR 84 | Temp 98.6°F | Resp 12 | Ht 62.0 in | Wt 118.0 lb

## 2012-07-29 DIAGNOSIS — R413 Other amnesia: Secondary | ICD-10-CM

## 2012-07-29 DIAGNOSIS — M542 Cervicalgia: Secondary | ICD-10-CM

## 2012-07-29 DIAGNOSIS — G35 Multiple sclerosis: Secondary | ICD-10-CM

## 2012-07-29 DIAGNOSIS — IMO0001 Reserved for inherently not codable concepts without codable children: Secondary | ICD-10-CM

## 2012-07-29 DIAGNOSIS — G3184 Mild cognitive impairment, so stated: Secondary | ICD-10-CM

## 2012-07-29 DIAGNOSIS — F410 Panic disorder [episodic paroxysmal anxiety] without agoraphobia: Secondary | ICD-10-CM

## 2012-07-29 DIAGNOSIS — M797 Fibromyalgia: Secondary | ICD-10-CM

## 2012-07-29 DIAGNOSIS — F3289 Other specified depressive episodes: Secondary | ICD-10-CM

## 2012-07-29 DIAGNOSIS — F329 Major depressive disorder, single episode, unspecified: Secondary | ICD-10-CM

## 2012-07-29 MED ORDER — METHYLPREDNISOLONE ACETATE 40 MG/ML IJ SUSP
40.0000 mg | Freq: Once | INTRAMUSCULAR | Status: AC
  Administered 2012-07-29: 40 mg via INTRAMUSCULAR

## 2012-07-29 MED ORDER — FLUTICASONE PROPIONATE 50 MCG/ACT NA SUSP: 1.0000 | Freq: Every day | NASAL | Status: DC

## 2012-07-29 MED ORDER — MELOXICAM 15 MG PO TABS: 15.0000 mg | ORAL_TABLET | Freq: Every day | ORAL | Status: DC

## 2012-07-29 NOTE — Assessment & Plan Note (Signed)
Patient with progressive weakness that is asymmetric in distribution.  Plan She will need to establish with a new [place] neurologist since Dr. Imagene Gurney retirement. Her weakness prevents her from doing any prolonged physical activity.

## 2012-07-29 NOTE — Assessment & Plan Note (Signed)
Chronic problem with waxing and waning symptoms. Presents today for worsening pain.  Plan Trigger point injection done at right trapezius point of maximal tenderness.

## 2012-07-29 NOTE — Assessment & Plan Note (Signed)
Eval Dr. Maple Hudson July '11: decreased executive function, decreased cognitive flexibility, poor working memory, impaired problem solving, diminished planning and organizational skills. Eval by [name], psychologist - spring '14 - ADD, cognitive impairment that was significant, cannot stay on task.

## 2012-07-29 NOTE — Assessment & Plan Note (Signed)
Severe depression with emotional lability and anhedonia. On multiple medications. Had an independent assessment by [name]. Her depression is disabling.  Plan Continue present medications

## 2012-07-29 NOTE — Progress Notes (Signed)
Subjective:    Patient ID: Jordan [name], female    DOB: [birth year], 52 y.o.   MRN: [id number]  HPI Jordan [name] presents on her birthday with acute on chronic right neck/trapezius pain. She reviewed with me her recent disability exam experience and we reviewed Jordan [name] recent evaluation. She has progressive weakness, increasing memory problems, inability to stay focused and complete tasks. Her depression is significant but stable without suicidal ideation at today's visit. She is on the verge of tears especially when describing her difficulties with the disability application process. She was w/o insurance but now has a Product manager blue advantage plan that is helping with her coverage.  Past Medical History  Diagnosis Date  . Headache(784.0)   . Depressive disorder, not elsewhere classified   . Anemia, unspecified   . History of bulimia   . History of anorexia nervosa   . Meningitis, unspecified(322.9)   . Other specified glaucoma   . Allergic rhinitis due to pollen   . Acute upper respiratory infections of unspecified site   . Diffuse cystic mastopathy   . Lump or mass in breast   . Perforation of tympanic membrane, unspecified   . Anal fissure   . Anxiety disorder   . Fibromyalgia   . IBS (irritable bowel syndrome)   . Seizures   . Radial styloid tenosynovitis   . 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 Complication of anesthesia     wakes up slow   Past Surgical History  Procedure Laterality Date  . Dilation and curettage of uterus      History of Menometrorrhagia  . Wrist surgery      for de-quervaine - bilateral wrists - tendonitis  . Ankle surgery      right ankle - scar tissue  . Inner ear surgery      6 surgeries on right ear   Family History  Problem Relation Age of Onset  . Ovarian cancer Mother   . Heart disease Mother   . Hypertension Mother   . Coronary artery disease Mother   . Heart failure Father   . Hypertension Father   . Hyperlipidemia Father   .  Diabetes Father   . Breast cancer Maternal Grandmother   . Colon cancer Neg Hx    History   Social History  . Marital Status: Divorced    Spouse Name: N/A    Number of Children: N/A  . Years of Education: N/A   Occupational History  . Not on file.   Social History Main Topics  . Smoking status: Never Smoker   . Smokeless tobacco: Never Used  . Alcohol Use: Yes     Comment: 2/ Day  . Drug Use: No  . Sexually Active: Not on file   Other Topics Concern  . Not on file   Social History Narrative   HSG, [employer] BA, App for MA. 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 Married 5 years - divorced, no children                   Current Outpatient Prescriptions on File Prior to Visit  Medication Sig Dispense Refill  . albuterol (PROVENTIL HFA;VENTOLIN HFA) 108 (90 BASE) MCG/ACT inhaler Inhale 2 puffs into the lungs every 6 (six) hours as needed for wheezing or shortness of breath.  1 Inhaler  0  . ALPRAZolam (XANAX) 0.5 MG tablet  Take 0.5 mg by mouth at bedtime as needed. For sleep.      Marland Kitchen amphetamine-dextroamphetamine (ADDERALL) 20 MG tablet Take 1 tablet (20 mg total) by mouth as directed. Take 20mg  at 1 AM and 10 mg in PM      . b complex vitamins tablet Take 1 tablet by mouth daily.      . cyclobenzaprine (FLEXERIL) 10 MG tablet TAKE ONE TABLET 3 TIMES A DAY AS NEEDED.FOR MUSCLE SPASMS  30 tablet  0  . doxycycline (VIBRA-TABS) 100 MG tablet Take 1 tablet (100 mg total) by mouth 2 (two) times daily.  20 tablet  1  . DULoxetine (CYMBALTA) 60 MG capsule Take 90 mg by mouth daily.       . methylPREDNISolone acetate (DEPO-MEDROL) 40 MG/ML injection Inject 1 mL (40 mg total) into the muscle once.  2 mL  0  . predniSONE (DELTASONE) 10 MG tablet Take 1 tablet (10 mg total) by mouth daily. 3 tabs daily for 3 days; 2 tabs daily for 3 days; 1 tab daily for 6 days  21 tablet  0  . ranitidine (ZANTAC) 150 MG  tablet Take 1 tablet (150 mg total) by mouth 2 (two) times daily. For acid reflux.  60 tablet  11  . traZODone (DESYREL) 50 MG tablet Take 100 mg by mouth at bedtime.       No current facility-administered medications on file prior to visit.      Review of Systems System review is negative for any constitutional, cardiac, pulmonary, GI or neuro symptoms or complaints other than as described in the HPI.     Objective:   Physical Exam Filed Vitals:   07/29/12 1545  BP: 140/90  Pulse: 84  Temp: 98.6 F (37 C)  Resp: 12   Wt Readings from Last 3 Encounters:  07/29/12 118 lb (53.524 kg)  05/08/12 123 lb 9.6 oz (56.065 kg)  03/13/12 121 lb (54.885 kg)   Gen'l - WNWD white woman who is emotional but in no acute distress HEENT- C&S clear Neck - full ROM. Point tenderness - right posterior trapezius location at base of neck posteriorly Cor- 2+ radial, RRR Pulm - normal respirations, no increased WOB, no cough, no rales or wheezing Neuro - formal strength testing deferred to recent disability exam. She has a normal gait. No tremor.  Procedure trigger point injection  Indication - localized pain Consent - informed verbal consent from patient after explanation of risks of bleeding and infection Prep - injection site identified, prepped with betadine followed by alcohol. Med -  40 Mg depomedrol with  1Cc 2% xylocain Injection - trigger point confirmed by exam. Injected without difficulty. Patient tolerated this well. Post-procedure - patient with rapid reduction in discomfort. Bandaid applied. Routine precautions provided including instruction to return for fever, drainage or increased pain       Assessment & Plan:

## 2012-07-29 NOTE — Assessment & Plan Note (Signed)
Remains a problem with medical management by Dr. Evelene Croon

## 2012-07-30 ENCOUNTER — Telehealth: Payer: Self-pay

## 2012-07-30 NOTE — Telephone Encounter (Signed)
Phone call to patient again. She states she will come by the office tomorrow and pick up the letter. I told her it will be at the front desk.

## 2012-07-30 NOTE — Telephone Encounter (Signed)
Phone call to patient to seen if she wants the letter Dr Debby Bud signed mailed or if she wants to pick it up.

## 2012-07-31 ENCOUNTER — Ambulatory Visit: Payer: BC Managed Care – PPO | Admitting: Psychiatry

## 2012-08-01 ENCOUNTER — Ambulatory Visit (INDEPENDENT_AMBULATORY_CARE_PROVIDER_SITE_OTHER): Payer: BC Managed Care – PPO | Admitting: Psychiatry

## 2012-08-01 DIAGNOSIS — F331 Major depressive disorder, recurrent, moderate: Secondary | ICD-10-CM

## 2012-08-01 DIAGNOSIS — F988 Other specified behavioral and emotional disorders with onset usually occurring in childhood and adolescence: Secondary | ICD-10-CM

## 2012-08-21 ENCOUNTER — Encounter: Payer: Self-pay | Admitting: Internal Medicine

## 2012-08-23 ENCOUNTER — Encounter: Payer: Self-pay | Admitting: Internal Medicine

## 2012-08-26 ENCOUNTER — Other Ambulatory Visit: Payer: Self-pay | Admitting: Internal Medicine

## 2012-08-26 DIAGNOSIS — G3184 Mild cognitive impairment, so stated: Secondary | ICD-10-CM

## 2012-08-26 DIAGNOSIS — N3942 Incontinence without sensory awareness: Secondary | ICD-10-CM

## 2012-08-27 ENCOUNTER — Ambulatory Visit: Payer: BC Managed Care – PPO | Admitting: Psychiatry

## 2012-08-28 ENCOUNTER — Telehealth: Payer: Self-pay

## 2012-08-28 ENCOUNTER — Emergency Department (HOSPITAL_COMMUNITY)
Admission: EM | Admit: 2012-08-28 | Discharge: 2012-08-29 | Disposition: A | Discharge status: Home / Self Care | Payer: BC Managed Care – PPO | Attending: Emergency Medicine | Admitting: Emergency Medicine

## 2012-08-28 ENCOUNTER — Encounter (HOSPITAL_COMMUNITY): Payer: Self-pay | Admitting: Emergency Medicine

## 2012-08-28 DIAGNOSIS — F411 Generalized anxiety disorder: Secondary | ICD-10-CM | POA: Insufficient documentation

## 2012-08-28 DIAGNOSIS — Z8719 Personal history of other diseases of the digestive system: Secondary | ICD-10-CM | POA: Insufficient documentation

## 2012-08-28 DIAGNOSIS — Z8661 Personal history of infections of the central nervous system: Secondary | ICD-10-CM | POA: Insufficient documentation

## 2012-08-28 DIAGNOSIS — Z8679 Personal history of other diseases of the circulatory system: Secondary | ICD-10-CM | POA: Insufficient documentation

## 2012-08-28 DIAGNOSIS — F329 Major depressive disorder, single episode, unspecified: Secondary | ICD-10-CM | POA: Insufficient documentation

## 2012-08-28 DIAGNOSIS — M79671 Pain in right foot: Secondary | ICD-10-CM

## 2012-08-28 DIAGNOSIS — Z8742 Personal history of other diseases of the female genital tract: Secondary | ICD-10-CM | POA: Insufficient documentation

## 2012-08-28 DIAGNOSIS — Z8659 Personal history of other mental and behavioral disorders: Secondary | ICD-10-CM | POA: Insufficient documentation

## 2012-08-28 DIAGNOSIS — Z79899 Other long term (current) drug therapy: Secondary | ICD-10-CM | POA: Insufficient documentation

## 2012-08-28 DIAGNOSIS — Z862 Personal history of diseases of the blood and blood-forming organs and certain disorders involving the immune mechanism: Secondary | ICD-10-CM | POA: Insufficient documentation

## 2012-08-28 DIAGNOSIS — Z8669 Personal history of other diseases of the nervous system and sense organs: Secondary | ICD-10-CM | POA: Insufficient documentation

## 2012-08-28 DIAGNOSIS — Z8709 Personal history of other diseases of the respiratory system: Secondary | ICD-10-CM | POA: Insufficient documentation

## 2012-08-28 DIAGNOSIS — Z87828 Personal history of other (healed) physical injury and trauma: Secondary | ICD-10-CM | POA: Insufficient documentation

## 2012-08-28 DIAGNOSIS — Z8739 Personal history of other diseases of the musculoskeletal system and connective tissue: Secondary | ICD-10-CM | POA: Insufficient documentation

## 2012-08-28 DIAGNOSIS — M79609 Pain in unspecified limb: Secondary | ICD-10-CM | POA: Insufficient documentation

## 2012-08-28 DIAGNOSIS — R45851 Suicidal ideations: Secondary | ICD-10-CM

## 2012-08-28 DIAGNOSIS — F3289 Other specified depressive episodes: Secondary | ICD-10-CM | POA: Insufficient documentation

## 2012-08-28 NOTE — Telephone Encounter (Signed)
Phone call from patient stating she has been involuntary taken to [facility] crisis center,mental holding tank. They are not letting her go for another 2 days since they do not know her health history. She has not had access to any of her medications. She is requesting you place a call to Dr Georjean Mode [phone] at the facility to talk with him regarding her so she can possibly be let go.

## 2012-08-28 NOTE — ED Notes (Signed)
Pt sent here from [facility] for medical clearance  Paperwork states pt is c/o numbness betow the right knee accompanied by pain at the bottom of her right foot radiating to heel and up the back of foot to the ankle  Pt states it is difficult to ambulate

## 2012-08-29 ENCOUNTER — Emergency Department (HOSPITAL_COMMUNITY): Payer: BC Managed Care – PPO

## 2012-08-29 LAB — CBC WITH DIFFERENTIAL/PLATELET
Basophils Absolute: 0.1 10*3/uL (ref 0.0–0.1)
Basophils Relative: 1 % (ref 0–1)
Eosinophils Absolute: 0.1 10*3/uL (ref 0.0–0.7)
Eosinophils Relative: 2 % (ref 0–5)
HCT: 42 % (ref 36.0–46.0)
Hemoglobin: 15.3 g/dL — ABNORMAL HIGH (ref 12.0–15.0)
Lymphocytes Relative: 20 % (ref 12–46)
Lymphs Abs: 1.4 10*3/uL (ref 0.7–4.0)
MCH: 33.4 pg (ref 26.0–34.0)
MCHC: 36.4 g/dL — ABNORMAL HIGH (ref 30.0–36.0)
MCV: 91.7 fL (ref 78.0–100.0)
Monocytes Absolute: 0.7 10*3/uL (ref 0.1–1.0)
Monocytes Relative: 10 % (ref 3–12)
Neutro Abs: 4.7 10*3/uL (ref 1.7–7.7)
Neutrophils Relative %: 68 % (ref 43–77)
Platelets: 229 10*3/uL (ref 150–400)
RBC: 4.58 MIL/uL (ref 3.87–5.11)
RDW: 12.2 % (ref 11.5–15.5)
WBC: 7 10*3/uL (ref 4.0–10.5)

## 2012-08-29 LAB — BASIC METABOLIC PANEL
BUN: 9 mg/dL (ref 6–23)
CO2: 27 mEq/L (ref 19–32)
Calcium: 9.8 mg/dL (ref 8.4–10.5)
Chloride: 103 mEq/L (ref 96–112)
Creatinine, Ser: 0.72 mg/dL (ref 0.50–1.10)
GFR calc Af Amer: >90 mL/min (ref >90)
GFR calc non Af Amer: >90 mL/min (ref >90)
Glucose, Bld: 108 mg/dL — ABNORMAL HIGH (ref 70–99)
Potassium: 4.2 mEq/L (ref 3.5–5.1)
Sodium: 140 mEq/L (ref 135–145)

## 2012-08-29 LAB — RAPID URINE DRUG SCREEN, HOSP PERFORMED
Amphetamines: NOT DETECTED
Barbiturates: NOT DETECTED
Benzodiazepines: NOT DETECTED
Cocaine: NOT DETECTED
Opiates: NOT DETECTED
Tetrahydrocannabinol: NOT DETECTED

## 2012-08-29 LAB — URINALYSIS, ROUTINE W REFLEX MICROSCOPIC
Bilirubin Urine: NEGATIVE
Glucose, UA: NEGATIVE mg/dL
Hgb urine dipstick: NEGATIVE
Ketones, ur: NEGATIVE mg/dL
Leukocytes, UA: NEGATIVE
Nitrite: NEGATIVE
Protein, ur: NEGATIVE mg/dL
Specific Gravity, Urine: 1.011 (ref 1.005–1.030)
Urobilinogen, UA: 0.2 mg/dL (ref 0.0–1.0)
pH: 7.5 (ref 5.0–8.0)

## 2012-08-29 MED ORDER — IBUPROFEN 800 MG PO TABS
800.0000 mg | ORAL_TABLET | Freq: Once | ORAL | Status: AC
  Administered 2012-08-29: 800 mg via ORAL
  Filled 2012-08-29: qty 1

## 2012-08-29 MED ORDER — ALPRAZOLAM 0.5 MG PO TABS
0.5000 mg | ORAL_TABLET | Freq: Once | ORAL | Status: AC
  Administered 2012-08-29: 0.5 mg via ORAL
  Filled 2012-08-29: qty 1

## 2012-08-29 MED ORDER — DULOXETINE HCL 30 MG PO CPEP
30.0000 mg | ORAL_CAPSULE | Freq: Once | ORAL | Status: AC
  Administered 2012-08-29: 30 mg via ORAL
  Filled 2012-08-29: qty 1

## 2012-08-29 NOTE — ED Notes (Signed)
Report called to [facility]; states ok to send her back via [facility]

## 2012-08-29 NOTE — ED Notes (Signed)
MD at bedside. 

## 2012-08-29 NOTE — ED Notes (Signed)
Assumed care of pt; [employer] x 2 at bedside; pt medicated as ordered; c/o feeling like muscles are tight all over; waiting for discharge back to [facility]

## 2012-08-30 ENCOUNTER — Telehealth: Payer: Self-pay

## 2012-08-30 ENCOUNTER — Ambulatory Visit: Payer: Self-pay | Admitting: Internal Medicine

## 2012-08-30 NOTE — ED Provider Notes (Signed)
History    CSN: [id number] Arrival date & time 08/28/12  2255  First MD Initiated Contact with Patient 08/29/12 0210     Chief Complaint  Patient presents with  . Medical Clearance    HPIpatient presents to the emergency department for medical clearance. Patient has been trying to get disability however this was rejected by the state, patient remarked about suicidal ideations and is under IVC given history of depressive disorder. Patient complains about right foot pain, she did not into the foot, the pain is severe, worse when ambulating, pain spans from the MTP joints back to the heel. Patient also complains about some tingling below the right knee, this is chronic. Pain is severe, sharp. She has not taken anything for it. Past Medical History  Diagnosis Date  . Headache(784.0)   . Depressive disorder, not elsewhere classified   . Anemia, unspecified   . History of bulimia   . History of anorexia nervosa   . Meningitis, unspecified(322.9)   . Other specified glaucoma   . Allergic rhinitis due to pollen   . Acute upper respiratory infections of unspecified site   . Diffuse cystic mastopathy   . Lump or mass in breast   . Perforation of tympanic membrane, unspecified   . Anal fissure   . Anxiety disorder   . Fibromyalgia   . IBS (irritable bowel syndrome)   . Seizures   . Radial styloid tenosynovitis   . 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 Complication of anesthesia     wakes up slow   Past Surgical History  Procedure Laterality Date  . Dilation and curettage of uterus      History of Menometrorrhagia  . Wrist surgery      for de-quervaine - bilateral wrists - tendonitis  . Ankle surgery      right ankle - scar tissue  . Inner ear surgery      6 surgeries on right ear   Family History  Problem Relation Age of Onset  . Ovarian cancer Mother   . Heart disease Mother   . Hypertension Mother   . Coronary artery disease Mother   . Heart failure Father   . Hypertension Father    . Hyperlipidemia Father   . Diabetes Father   . Breast cancer Maternal Grandmother   . Colon cancer Neg Hx    History  Substance Use Topics  . Smoking status: Never Smoker   . Smokeless tobacco: Never Used  . Alcohol Use: Yes     Comment: 2/ Day   OB History   Grav Para Term Preterm Abortions TAB SAB Ect Mult Living                 Review of Systems At least 10pt or greater review of systems completed and are negative except where specified in the HPI.  Allergies  Review of patient's allergies indicates no known allergies.  Home Medications   Current Outpatient Rx  Name  Route  Sig  Dispense  Refill  . albuterol (PROVENTIL HFA;VENTOLIN HFA) 108 (90 BASE) MCG/ACT inhaler   Inhalation   Inhale 2 puffs into the lungs every 6 (six) hours as needed for wheezing or shortness of breath.   1 Inhaler   0   . ALPRAZolam (XANAX) 0.5 MG tablet   Oral   Take 0.5 mg by mouth at bedtime as needed. For sleep.         Marland Kitchen amphetamine-dextroamphetamine (ADDERALL) 20 MG tablet  Oral   Take 1 tablet (20 mg total) by mouth as directed. Take 20mg  at 1 AM and 10 mg in PM         . b complex vitamins tablet   Oral   Take 1 tablet by mouth daily.         . DULoxetine (CYMBALTA) 30 MG capsule   Oral   Take 90 mg by mouth daily.         . fluticasone (FLONASE) 50 MCG/ACT nasal spray   Nasal   Place 1 spray into the nose daily.   16 g   3   . meloxicam (MOBIC) 15 MG tablet   Oral   Take 1 tablet (15 mg total) by mouth daily.   30 tablet   0   . ranitidine (ZANTAC) 150 MG tablet   Oral   Take 1 tablet (150 mg total) by mouth 2 (two) times daily. For acid reflux.   60 tablet   11   . traZODone (DESYREL) 50 MG tablet   Oral   Take 100 mg by mouth at bedtime.         . cyclobenzaprine (FLEXERIL) 10 MG tablet      TAKE ONE TABLET 3 TIMES A DAY AS NEEDED.FOR MUSCLE SPASMS   30 tablet   0    BP 98/54  Pulse 69  Temp(Src) 98.1 F (36.7 C) (Oral)  Resp 17   SpO2 97% Physical Exam  Nursing notes reviewed.  Electronic medical record reviewed. VITAL SIGNS:   Filed Vitals:   08/28/12 2300 08/29/12 0459  BP: 128/76 98/54  Pulse: 75 69  Temp: 97.6 F (36.4 C) 98.1 F (36.7 C)  TempSrc: Oral Oral  Resp: 16 17  SpO2: 100% 97%   CONSTITUTIONAL: Awake, oriented, appears non-toxic HENT: Atraumatic, normocephalic, oral mucosa pink and moist, airway patent. Nares patent without drainage. External ears normal. EYES: Conjunctiva clear, EOMI, PERRLA NECK: Trachea midline, non-tender, supple CARDIOVASCULAR: Normal heart rate, Normal rhythm, No murmurs, rubs, gallops PULMONARY/CHEST: Clear to auscultation, no rhonchi, wheezes, or rales. Symmetrical breath sounds. Non-tender. ABDOMINAL: Non-distended, soft, non-tender - no rebound or guarding.  BS normal. NEUROLOGIC: Non-focal, moving all four extremities, no gross sensory or motor deficits. EXTREMITIES: No clubbing, cyanosis, or edema. Tenderness to palpation along the plantar aspect of the right foot from the MTP joints all went back to the heel. SKIN: Warm, Dry, No erythema, No rash  ED Course  Procedures (including critical care time) Labs Reviewed  BASIC METABOLIC PANEL - Abnormal; Notable for the following:    Glucose, Bld 108 (*)    All other components within normal limits  CBC WITH DIFFERENTIAL - Abnormal; Notable for the following:    Hemoglobin 15.3 (*)    MCHC 36.4 (*)    All other components within normal limits  URINE RAPID DRUG SCREEN (HOSP PERFORMED)  URINALYSIS, ROUTINE W REFLEX MICROSCOPIC   Dg Foot Complete Right  08/29/2012   *RADIOLOGY REPORT*  Clinical Data: Posterior foot pain for a long time.  No known injury.  RIGHT FOOT COMPLETE - 3+ VIEW  Comparison: None.  Findings: The right foot appears intact. No evidence of acute fracture or subluxation.  No focal bone lesions.  Bone matrix and cortex appear intact.  No abnormal radiopaque densities in the soft tissues.  IMPRESSION:  No acute bony abnormalities demonstrated in the right foot.   Original Report Authenticated By: Burman Nieves, M.D.   1. Suicidal ideation   2. Foot pain,  right     MDM  Patient arrives for medical clearance from [facility]-patient is medically clear, she appears depressed although she denies suicidal ideation at this time. Patient likely suffered from plantar fasciitis. [facility]stable to [facility], MD 08/30/12 [phone]

## 2012-08-30 NOTE — Telephone Encounter (Signed)
Phone call from patient stating she is still at the [place] crisis center. She is not able to see a doctor today, so she says now she will be there through the weekend. They are only giving her Ibuprofen which is not helping her pain she has in her legs and arms due to fibromyalgia and other medical conditions. She is requesting you call the physician there [phone] so they can be aware of you medical conditions.

## 2012-09-03 ENCOUNTER — Ambulatory Visit (INDEPENDENT_AMBULATORY_CARE_PROVIDER_SITE_OTHER): Payer: BC Managed Care – PPO | Admitting: Psychiatry

## 2012-09-03 DIAGNOSIS — F988 Other specified behavioral and emotional disorders with onset usually occurring in childhood and adolescence: Secondary | ICD-10-CM

## 2012-09-03 DIAGNOSIS — F331 Major depressive disorder, recurrent, moderate: Secondary | ICD-10-CM

## 2012-09-10 ENCOUNTER — Ambulatory Visit (INDEPENDENT_AMBULATORY_CARE_PROVIDER_SITE_OTHER): Payer: BC Managed Care – PPO | Admitting: Internal Medicine

## 2012-09-10 ENCOUNTER — Ambulatory Visit (INDEPENDENT_AMBULATORY_CARE_PROVIDER_SITE_OTHER): Payer: BC Managed Care – PPO | Admitting: Psychiatry

## 2012-09-10 ENCOUNTER — Encounter: Payer: Self-pay | Admitting: Internal Medicine

## 2012-09-10 VITALS — BP 108/80 | HR 82 | Temp 97.6°F | Wt 118.0 lb

## 2012-09-10 DIAGNOSIS — G35 Multiple sclerosis: Secondary | ICD-10-CM

## 2012-09-10 DIAGNOSIS — F331 Major depressive disorder, recurrent, moderate: Secondary | ICD-10-CM

## 2012-09-10 DIAGNOSIS — F3289 Other specified depressive episodes: Secondary | ICD-10-CM

## 2012-09-10 DIAGNOSIS — F0630 Mood disorder due to known physiological condition, unspecified: Secondary | ICD-10-CM

## 2012-09-10 DIAGNOSIS — F329 Major depressive disorder, single episode, unspecified: Secondary | ICD-10-CM

## 2012-09-10 NOTE — Progress Notes (Signed)
Subjective:    Patient ID: Jordan [name], female    DOB: 1960/03/25, 52 y.o.   MRN: [id number]  HPI Recent psychiatric hospitalization - for suicidal ideation - one week at [facility].  She had a great nail injury right foot - she went to the ED [facility] and it appears she had the great nail avulsed. She still has great nail pain and bleeding.  Focused on neurologic changes: weakness, foot drag, memory and cognitive decline. A diagnosis of MS was entertained but not substantiated although she did have white lesions in the Brain on MRI. She has raised the  question of chronic lyme meningitis - has had a telephone consult with a Dr. In [place] who specializes in Lyme's disease. Would like a second opinion and return to Dr. Garen Lah at [facility].   Past Medical History  Diagnosis Date  . Headache(784.0)   . Depressive disorder, not elsewhere classified   . Anemia, unspecified   . History of bulimia   . History of anorexia nervosa   . Meningitis, unspecified(322.9)   . Other specified glaucoma   . Allergic rhinitis due to pollen   . Acute upper respiratory infections of unspecified site   . Diffuse cystic mastopathy   . Lump or mass in breast   . Perforation of tympanic membrane, unspecified   . Anal fissure   . Anxiety disorder   . Fibromyalgia   . IBS (irritable bowel syndrome)   . Seizures   . Radial styloid tenosynovitis   . 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 Complication of anesthesia     wakes up slow   Past Surgical History  Procedure Laterality Date  . Dilation and curettage of uterus      History of Menometrorrhagia  . Wrist surgery      for de-quervaine - bilateral wrists - tendonitis  . Ankle surgery      right ankle - scar tissue  . Inner ear surgery      6 surgeries on right ear   Family History  Problem Relation Age of Onset  . Ovarian cancer Mother   . Heart disease Mother   . Hypertension Mother   . Coronary artery disease Mother   . Heart failure  Father   . Hypertension Father   . Hyperlipidemia Father   . Diabetes Father   . Breast cancer Maternal Grandmother   . Colon cancer Neg Hx    History   Social History  . Marital Status: Divorced    Spouse Name: N/A    Number of Children: N/A  . Years of Education: N/A   Occupational History  . Not on file.   Social History Main Topics  . Smoking status: Never Smoker   . Smokeless tobacco: Never Used  . Alcohol Use: Yes     Comment: 2/ Day  . Drug Use: No  . Sexually Active: Not on file   Other Topics Concern  . Not on file   Social History Narrative   [employer], [employer], [employer] for MA. 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 Married 5 years - divorced, no children                   Current Outpatient Prescriptions on File Prior to Visit  Medication Sig Dispense Refill  . albuterol (PROVENTIL HFA;VENTOLIN HFA) 108 (90 BASE) MCG/ACT inhaler Inhale 2 puffs into  the lungs every 6 (six) hours as needed for wheezing or shortness of breath.  1 Inhaler  0  . ALPRAZolam (XANAX) 0.5 MG tablet Take 0.5 mg by mouth at bedtime as needed. For sleep.      Marland Kitchen amphetamine-dextroamphetamine (ADDERALL) 20 MG tablet Take 1 tablet (20 mg total) by mouth as directed. Take 20mg  at 1 AM and 10 mg in PM      . b complex vitamins tablet Take 1 tablet by mouth daily.      . cyclobenzaprine (FLEXERIL) 10 MG tablet TAKE ONE TABLET 3 TIMES A DAY AS NEEDED.FOR MUSCLE SPASMS  30 tablet  0  . DULoxetine (CYMBALTA) 30 MG capsule Take 90 mg by mouth daily.      . fluticasone (FLONASE) 50 MCG/ACT nasal spray Place 1 spray into the nose daily.  16 g  3  . meloxicam (MOBIC) 15 MG tablet Take 1 tablet (15 mg total) by mouth daily.  30 tablet  0  . ranitidine (ZANTAC) 150 MG tablet Take 1 tablet (150 mg total) by mouth 2 (two) times daily. For acid reflux.  60 tablet  11  . traZODone (DESYREL) 50 MG tablet Take 100 mg by mouth at  bedtime.       No current facility-administered medications on file prior to visit.      Review of Systems System review is negative for any constitutional, cardiac, pulmonary, GI or neuro symptoms or complaints other than as described in the HPI.     Objective:   Physical Exam Filed Vitals:   09/10/12 1146  BP: 108/80  Pulse: 82  Temp: 97.6 F (36.4 C)   gen'l - WNWD nicely groomed white woman in no distress HEENT - C&S clear, PERRLA Cor - RRR Pulm - normal respirations Derm - right great nail seems to have been avulsed - nail bed is shiny but no erythematous or infected appearing. Neuro - A&O x 3, normal facial symmetry, normal gait.       Assessment & Plan:

## 2012-09-10 NOTE — Assessment & Plan Note (Addendum)
Progressive problems with leg weakness and memory loss. She would like to be revaluated by Dr. Garen Lah at [place], especially since. Dr. Alena Bills retirement. In her reading she has become concerned for late-phase lyme's disease. She is advised to not seek over the phone or internet consultation or accept any suggestions for expensive labs without a formal doctor - patient relationship. If she remains concerned about Lymes disease she should consult with an infectious disease specialist either at [facility] or at [facility].

## 2012-09-10 NOTE — Assessment & Plan Note (Signed)
Recently hospitalized with suicidal ideation which she denies - at least she has no plan. She does seem obsessive about her problems . Still seeing Dr. Evelene Croon to our knowledge. Does not appear to be a threat to herself or others at this interview.

## 2012-09-10 NOTE — Patient Instructions (Addendum)
1. Great toe - it appears that the nail was avulsed. There is no sign of infection  Plan Warm soaks  Moisturizing lotion  For increased problems you should see a podiatrist  2. Neuro - with on-going questions about leg weakness, fatigue and other symptoms will arrange for repeat consult with Dr. Garen Lah at [facility].   3. Meds - it is ok to take "old" meloxicam for pain - it is an effective anti-inflammatory.

## 2012-09-14 ENCOUNTER — Encounter: Payer: Self-pay | Admitting: Internal Medicine

## 2012-09-16 ENCOUNTER — Other Ambulatory Visit: Payer: Self-pay | Admitting: Internal Medicine

## 2012-09-16 DIAGNOSIS — G35 Multiple sclerosis: Secondary | ICD-10-CM

## 2012-09-17 ENCOUNTER — Other Ambulatory Visit: Payer: Self-pay | Admitting: Internal Medicine

## 2012-09-17 DIAGNOSIS — Z1231 Encounter for screening mammogram for malignant neoplasm of breast: Secondary | ICD-10-CM

## 2012-09-18 ENCOUNTER — Ambulatory Visit (INDEPENDENT_AMBULATORY_CARE_PROVIDER_SITE_OTHER): Payer: BC Managed Care – PPO | Admitting: Psychiatry

## 2012-09-18 ENCOUNTER — Encounter: Payer: Self-pay | Admitting: Internal Medicine

## 2012-09-18 DIAGNOSIS — F988 Other specified behavioral and emotional disorders with onset usually occurring in childhood and adolescence: Secondary | ICD-10-CM

## 2012-09-18 DIAGNOSIS — F331 Major depressive disorder, recurrent, moderate: Secondary | ICD-10-CM

## 2012-09-20 ENCOUNTER — Ambulatory Visit (HOSPITAL_COMMUNITY)
Admission: RE | Admit: 2012-09-20 | Discharge: 2012-09-20 | Disposition: A | Discharge status: Home / Self Care | Payer: BC Managed Care – PPO | Source: Ambulatory Visit | Attending: Internal Medicine | Admitting: Internal Medicine

## 2012-09-20 DIAGNOSIS — Z1231 Encounter for screening mammogram for malignant neoplasm of breast: Secondary | ICD-10-CM

## 2012-09-25 ENCOUNTER — Ambulatory Visit: Payer: BC Managed Care – PPO | Admitting: Psychiatry

## 2012-10-08 ENCOUNTER — Encounter: Payer: Self-pay | Admitting: Neurology

## 2012-10-08 ENCOUNTER — Ambulatory Visit (INDEPENDENT_AMBULATORY_CARE_PROVIDER_SITE_OTHER): Payer: BC Managed Care – PPO | Admitting: Neurology

## 2012-10-08 VITALS — BP 110/70 | HR 74 | Temp 97.8°F | Ht 62.5 in | Wt 117.0 lb

## 2012-10-08 DIAGNOSIS — R4189 Other symptoms and signs involving cognitive functions and awareness: Secondary | ICD-10-CM

## 2012-10-08 DIAGNOSIS — F3289 Other specified depressive episodes: Secondary | ICD-10-CM

## 2012-10-08 DIAGNOSIS — F329 Major depressive disorder, single episode, unspecified: Secondary | ICD-10-CM

## 2012-10-08 DIAGNOSIS — F09 Unspecified mental disorder due to known physiological condition: Secondary | ICD-10-CM

## 2012-10-08 DIAGNOSIS — F32A Depression, unspecified: Secondary | ICD-10-CM

## 2012-10-08 DIAGNOSIS — R52 Pain, unspecified: Secondary | ICD-10-CM

## 2012-10-08 NOTE — Patient Instructions (Addendum)
You already had an extensive neurological workup, including repeat testing, such as MRIs, nerve conduction testing and spinal taps.  Regarding the cognitive issues, I would like to look at Dr. Phill Mutter notes.  However, I feel you would benefit from a more in-depth evaluation, specifically a formal neuropsychological evaluation.  Follow up after testing.   We will refer you to Dr. Leonides Cave for memory testing as recommended by Dr. Everlena Cooper. His office is located at [address] in [place]. Dr. Leonides Cave will call you to set up this appointment.   [phone].

## 2012-10-08 NOTE — Progress Notes (Signed)
NEUROLOGY CONSULTATION NOTE  Jordan [name] MRN: [id number] DOB: 1961/02/01  Referring provider: Dr. Debby Bud Primary care provider: Dr. Debby Bud  Reason for consult:  Memory problems, right sided arm pain and weakness  HISTORY OF PRESENT ILLNESS: Jordan [name] is a 52 year old woman with history of depression, painc disorder, chronic neck pain, and right peroneal neuropathy who presents for evaluation of progressive leg weakness and memory problems.  Records and images were personally reviewed where available.  She says she contracted meningitis "from a tick" in 1997.  Since that time, she has developed multiple symptoms.  For a few years, she suffered seizures and was on carbamazepine.  She hasn't had seizures in many years and is no longer taking antiepileptic medication for it.  She has history of non-traumatic right-sided neck, shoulder and arm pain dating back to 2008, as well as right-sided numbness involving the right arm and foot.  She initially saw Dr. Josephine Igo, a hand specialist, who felt symptoms were myofascial rather than radicular.  CT of cervical spine was reportedly normal.  She underwent muscular therapy, with was not effective.  She then had an MRI of the brain w/contrast performed on 07/03/07, which was fairly unremarkable except for two punctate FLAIR signal adjacent to the atria of the left lateral ventricle.  No abnormal enhancement seen.  She was referred to Dr. Avie Echevaria from [facility], who has since retired.  Lab work from 07/12/07, including Lyme titer, ANA, ACE, SSA and SSB-antibodies, lupus anticoagulant, and anti-cardiolipin antibodies were normal.  BAER and VEP performed 07/19/07 were normal.  LP was performed on 07/22/07, which revealed protein 32, glucose 56, one red blood cell, one WBC, ACE 4, VDRL NR, negative IgG index, negative CSF IgG, and negative oligoclonal bands.  EMG/NCV performed on 08/09/07 revealed evidence of carpal tunnel syndrome.    She  was then referred to Dr. Trudie Buckler, an MS specialist, at [facility] in August 2009 for these symptoms.  Multiple repeated MRIs of the brain w/wo (10/17/07, 06/06/08, 09/12/08, 06/20/09) were stable without any change to the two small lesions from initial MRI.  MRI of cervical spine performed on 09/12/08 and 06/20/09 were normal.  Although prior CSF VDRL was non-reactive, there was a reported positive FTA-abs and was treated with Pen-Vee K for one month.  She had a repeat NCV/EMG performed, which was reportedly unremarkable.  It was suggested that she may have complex regional pain syndrome.  She also had excessive daytime somnolence.  A polysomnogram was reportedly normal.   Dr. Sandria Manly suspected symptoms likely fibromyalgia in conjunction with depression.  Management included Lyrica, gabapentin, yoga, swimming, walking and trigger point injections in right shoulder.  Due to persistent symptoms, as well as right arm pain and shoulder tightness, Dr. Sandria Manly repeated an EMG to look for possible Thoracic Outlet Syndrome, as symptoms were worse when arms were raised.  It was performed by Dr. Lesia Sago, on 06/20/10, which was normal.  She was then referred to Dr. Orson Gear at [facility].  Imaging did not reveal a cervical rib.  Despite no clear structural cause of neurogenic thoracic outlet syndrome, and no electrodiagnostic evidence of plexus or nerve damage, it was considered to possibly refer her to neurosurgery.  Unfortunately, she lost her job and insurance.  She continues to have perceived memory problems and poor organizational skills.  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  Her office was disorganized.  She was unable to  prioritize.  Her home is disorganized.  She would misplace items all the time.  She also got lost driving in familiar areas.  She is concerned about dementia, as both parents had dementia, and her grandfather.  She had cognitive skill testing in July 2011,  which revealed difficulty with executive functioning, cognitive flexibility, working memory, Tourist information centre manager, and problem-solving skills but no significant memory impairment.  Results seemed more compatible with depression rather than organic cognitive impairment.   She had behavioral cognitive testing performed by Dr. [name] on 04/16/12.  Neurocognitive index was low, psychomotor speed was low, complex attention was low, composite memory was low average, visual memory was low average, reaction time was low average.  Verbal memory, cognitive flexibility, processing speed and executive functioning was average.  She exhibited marked to severe anxiety levels as well as moderate to severe depression.  She had a recent hospitalization for suicidal ideation.  She is concerned that she may have chronic Lyme disease and possibly early dementia.Marland Kitchen  PAST MEDICAL HISTORY: Past Medical History  Diagnosis Date  . Headache(784.0)   . Depressive disorder, not elsewhere classified   . Anemia, unspecified   . History of bulimia   . History of anorexia nervosa   . Meningitis, unspecified(322.9)   . Other specified glaucoma   . Allergic rhinitis due to pollen   . Acute upper respiratory infections of unspecified site   . Diffuse cystic mastopathy   . Lump or mass in breast   . Perforation of tympanic membrane, unspecified   . Anal fissure   . Anxiety disorder   . Fibromyalgia   . IBS (irritable bowel syndrome)   . Seizures   . Radial styloid tenosynovitis   . 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 Complication of anesthesia     wakes up slow    PAST SURGICAL HISTORY: Past Surgical History  Procedure Laterality Date  . Dilation and curettage of uterus      History of Menometrorrhagia  . Wrist surgery      for de-quervaine - bilateral wrists - tendonitis  . Ankle surgery      right ankle - scar tissue  . Inner ear surgery      6 surgeries on right ear    MEDICATIONS: Current  Outpatient Prescriptions on File Prior to Visit  Medication Sig Dispense Refill  . ALPRAZolam (XANAX) 0.5 MG tablet Take 0.5 mg by mouth at bedtime as needed. For sleep.      Marland Kitchen amphetamine-dextroamphetamine (ADDERALL) 20 MG tablet Take 1 tablet (20 mg total) by mouth as directed. Take 20mg  at 1 AM and 10 mg in PM      . b complex vitamins tablet Take 1 tablet by mouth daily.      . cyclobenzaprine (FLEXERIL) 10 MG tablet TAKE ONE TABLET 3 TIMES A DAY AS NEEDED.FOR MUSCLE SPASMS  30 tablet  0  . DULoxetine (CYMBALTA) 30 MG capsule Take 90 mg by mouth daily.      . fluticasone (FLONASE) 50 MCG/ACT nasal spray Place 1 spray into the nose daily.  16 g  3  . ranitidine (ZANTAC) 150 MG tablet Take 1 tablet (150 mg total) by mouth 2 (two) times daily. For acid reflux.  60 tablet  11  . traZODone (DESYREL) 50 MG tablet Take 100 mg by mouth at bedtime.      . meloxicam (MOBIC) 15 MG tablet Take 1 tablet (15 mg total) by mouth daily.  30 tablet  0   No current facility-administered medications on file prior to visit.    ALLERGIES: No Known Allergies  FAMILY HISTORY: Family History  Problem Relation Age of Onset  . Ovarian cancer Mother   . Heart disease Mother   . Hypertension Mother   . Coronary artery disease Mother   . Heart failure Father   . Hypertension Father   . Hyperlipidemia Father   . Diabetes Father   . Breast cancer Maternal Grandmother   . Colon cancer Neg Hx     SOCIAL HISTORY: History   Social History  . Marital Status: Divorced    Spouse Name: N/A    Number of Children: N/A  . Years of Education: N/A   Occupational History  . Not on file.   Social History Main Topics  . Smoking status: Never Smoker   . Smokeless tobacco: Never Used  . Alcohol Use: Yes     Comment: 2 glasses of wine/Day  . Drug Use: No  . Sexual Activity: Not on file   Other Topics Concern  . Not on file   Social History Narrative   HSG, [employer] BA, App for MA. 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 Married 5 years - divorced, no children                   REVIEW OF SYSTEMS: Constitutional: No fevers, chills, or sweats, no generalized fatigue, change in appetite Eyes: No visual changes, double vision, eye pain Ear, nose and throat: No hearing loss, ear pain, nasal congestion, sore throat Cardiovascular: No chest pain, palpitations Respiratory:  No shortness of breath at rest or with exertion, wheezes GastrointestinaI: No nausea, vomiting, diarrhea, abdominal pain, fecal incontinence Genitourinary:  No dysuria, urinary retention or frequency Musculoskeletal:  No neck pain, back pain Integumentary: No rash, pruritus, skin lesions Neurological: as above Psychiatric: No depression, insomnia, anxiety Endocrine: No palpitations, fatigue, diaphoresis, mood swings, change in appetite, change in weight, increased thirst Hematologic/Lymphatic:  No anemia, purpura, petechiae. Allergic/Immunologic: no itchy/runny eyes, nasal congestion, recent allergic reactions, rashes  PHYSICAL EXAM: Filed Vitals:   10/08/12 1229  BP: 110/70  Pulse: 74  Temp: 97.8 F (36.6 C)   General: No acute distress Head:  Normocephalic/atraumatic Neck: supple, no paraspinal tenderness, full range of motion Back: No paraspinal tenderness Heart: regular rate and rhythm Lungs: Clear to auscultation bilaterally. Vascular: No carotid bruits. Neurological Exam: Mental status: alert and oriented to person, place, and time, speech fluent and not dysarthric, language intact.  Serial 7 substraction intact, follows 3-step commands, recalls 1 out of 3 words after a couple of minutes, copies intersecting pentagons and clock correctly.  MMSE 28/30. Cranial nerves: CN I: not tested CN II: pupils equal, round and reactive to light, visual fields intact, fundi unremarkable. CN III, IV, VI:  full range of motion, no  nystagmus, no ptosis CN V: facial sensation intact CN VII: upper and lower face symmetric CN VIII: hearing intact CN IX, X: gag intact, uvula midline CN XI: sternocleidomastoid and trapezius muscles intact CN XII: tongue midline Bulk & Tone: normal, no fasciculations. Motor: reduced effort of RUE and LE, but otherwise 5/5 throughout. Sensation: Endorses mildly reduced temperature sensation on right UE and LE, vibration intact Deep Tendon Reflexes: 2+ throughout, toes down Finger to nose testing: slowed but no dysmetria Heel to shin: slowed but no ataxia Gait: steady stride, no  ataxia, able to walk on heels and toes, sways when walking in tandem but does not lose balance. Romberg negative.  IMPRESSION & PLAN: Jordan [name] is a 52 year old woman with longstanding history of multiple symptoms, including fatigue, memory problems, right-sided pain, numbness and weakness.    She has had an extensive workup which has not revealed any organic etiology.  I do not think she has multiple sclerosis, as demonstrated by normal objective exam findings, repeated MRIs and CSF study.  I do not see any evidence of thoracic outlet syndrome, given the normal EMG and no structural etiology.  I feel that her symptoms are likely psychogenic and need to be addressed and treated as so.   It has been suggested that she may have fibromyalgia.  Symptomatic treatment with appropriate pain medications would be reasonable, however this is out of my scope of practice.  Consider rheumatology consult to evaluate this further.  Continued management by psychiatry and a pain specialist would be appropriate.   If there is a concern for  chronic Lyme, then I would recommend consultation from an infectious disease specialist.  Regarding the memory problems, this may very well be secondary to depression.  However, I think a formal neuropsychological evaluation would be beneficial in straightening out what may be organic cognitive  impairment vs depression-related.  I will refer her for formal neuropsychological testing.  She will follow up after this test.  60 minute spent with patient on this complicated case, over 50% spent reviewing the notes, counseling and coordinating care.  Thank you for allowing me to take part in the care of this patient.  Shon Millet, DO  CC:  Illene Regulus, MD

## 2012-10-09 ENCOUNTER — Ambulatory Visit (INDEPENDENT_AMBULATORY_CARE_PROVIDER_SITE_OTHER): Payer: BC Managed Care – PPO | Admitting: Psychiatry

## 2012-10-09 DIAGNOSIS — F331 Major depressive disorder, recurrent, moderate: Secondary | ICD-10-CM

## 2012-10-09 DIAGNOSIS — F988 Other specified behavioral and emotional disorders with onset usually occurring in childhood and adolescence: Secondary | ICD-10-CM

## 2012-10-11 DIAGNOSIS — Z0279 Encounter for issue of other medical certificate: Secondary | ICD-10-CM

## 2012-10-14 ENCOUNTER — Encounter: Payer: Self-pay | Admitting: Internal Medicine

## 2012-10-16 ENCOUNTER — Ambulatory Visit (INDEPENDENT_AMBULATORY_CARE_PROVIDER_SITE_OTHER): Payer: BC Managed Care – PPO | Admitting: Psychiatry

## 2012-10-16 DIAGNOSIS — F331 Major depressive disorder, recurrent, moderate: Secondary | ICD-10-CM

## 2012-10-16 DIAGNOSIS — F988 Other specified behavioral and emotional disorders with onset usually occurring in childhood and adolescence: Secondary | ICD-10-CM

## 2012-10-23 ENCOUNTER — Ambulatory Visit (INDEPENDENT_AMBULATORY_CARE_PROVIDER_SITE_OTHER): Payer: BC Managed Care – PPO | Admitting: Psychiatry

## 2012-10-23 DIAGNOSIS — F988 Other specified behavioral and emotional disorders with onset usually occurring in childhood and adolescence: Secondary | ICD-10-CM

## 2012-10-23 DIAGNOSIS — F331 Major depressive disorder, recurrent, moderate: Secondary | ICD-10-CM

## 2012-10-30 ENCOUNTER — Ambulatory Visit: Payer: BC Managed Care – PPO | Admitting: Psychiatry

## 2012-11-06 ENCOUNTER — Encounter: Payer: Self-pay | Admitting: Internal Medicine

## 2012-11-06 DIAGNOSIS — F3289 Other specified depressive episodes: Secondary | ICD-10-CM

## 2012-11-06 DIAGNOSIS — F329 Major depressive disorder, single episode, unspecified: Secondary | ICD-10-CM

## 2012-11-07 ENCOUNTER — Ambulatory Visit (INDEPENDENT_AMBULATORY_CARE_PROVIDER_SITE_OTHER): Payer: BC Managed Care – PPO | Admitting: Psychiatry

## 2012-11-07 DIAGNOSIS — F331 Major depressive disorder, recurrent, moderate: Secondary | ICD-10-CM

## 2012-11-07 DIAGNOSIS — F988 Other specified behavioral and emotional disorders with onset usually occurring in childhood and adolescence: Secondary | ICD-10-CM

## 2012-11-12 ENCOUNTER — Ambulatory Visit (INDEPENDENT_AMBULATORY_CARE_PROVIDER_SITE_OTHER): Payer: BC Managed Care – PPO | Admitting: Internal Medicine

## 2012-11-12 ENCOUNTER — Encounter: Payer: Self-pay | Admitting: Internal Medicine

## 2012-11-12 VITALS — BP 114/70 | HR 86 | Temp 96.6°F | Wt 118.8 lb

## 2012-11-12 DIAGNOSIS — M79609 Pain in unspecified limb: Secondary | ICD-10-CM

## 2012-11-12 DIAGNOSIS — Z23 Encounter for immunization: Secondary | ICD-10-CM

## 2012-11-12 MED ORDER — HYDROCODONE-ACETAMINOPHEN 5-325 MG PO TABS: 1.0000 | ORAL_TABLET | Freq: Four times a day (QID) | ORAL | Status: DC | PRN

## 2012-11-12 NOTE — Patient Instructions (Addendum)
Toe without obvious infection but a lot of nail damage. Plan - referral to Dr. Harriet Pho and associates  Norco 5/325 every 6 hours as needed

## 2012-11-12 NOTE — Progress Notes (Signed)
Subjective:    Patient ID: Jordan [name], female    DOB: 27-Feb-1960, 52 y.o.   MRN: [id number]  HPI Jordan [name] presents for sore toes. She has had injury in the past with total avulsion left great nail. He now has involvement of many toes: she has been "picking" at the nail and also had trauma to the 2nd toe right - dropped a floor tile on it. She has a lot of pain but no fever, no drainage.  Past Medical History  Diagnosis Date  . Headache(784.0)   . Depressive disorder, not elsewhere classified   . Anemia, unspecified   . History of bulimia   . History of anorexia nervosa   . Meningitis, unspecified(322.9)   . Other specified glaucoma   . Allergic rhinitis due to pollen   . Acute upper respiratory infections of unspecified site   . Diffuse cystic mastopathy   . Lump or mass in breast   . Perforation of tympanic membrane, unspecified   . Anal fissure   . Anxiety disorder   . Fibromyalgia   . IBS (irritable bowel syndrome)   . Seizures   . Radial styloid tenosynovitis   . 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 Complication of anesthesia     wakes up slow   Past Surgical History  Procedure Laterality Date  . Dilation and curettage of uterus      History of Menometrorrhagia  . Wrist surgery      for de-quervaine - bilateral wrists - tendonitis  . Ankle surgery      right ankle - scar tissue  . Inner ear surgery      6 surgeries on right ear   Family History  Problem Relation Age of Onset  . Ovarian cancer Mother   . Heart disease Mother   . Hypertension Mother   . Coronary artery disease Mother   . Heart failure Father   . Hypertension Father   . Hyperlipidemia Father   . Diabetes Father   . Breast cancer Maternal Grandmother   . Colon cancer Neg Hx    History   Social History  . Marital Status: Divorced    Spouse Name: N/A    Number of Children: N/A  . Years of Education: N/A   Occupational History  . Not on file.   Social History Main Topics  . Smoking status:  Never Smoker   . Smokeless tobacco: Never Used  . Alcohol Use: Yes     Comment: 2 glasses of wine/Day  . Drug Use: No  . Sexual Activity: Not on file   Other Topics Concern  . Not on file   Social History Narrative   HSG, [employer] BA, App for MA. 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 Married 5 years - divorced, no children                    Current Outpatient Prescriptions on File Prior to Visit  Medication Sig Dispense Refill  . ALPRAZolam (XANAX) 0.5 MG tablet Take 0.5 mg by mouth at bedtime as needed. For sleep.      Marland Kitchen amphetamine-dextroamphetamine (ADDERALL) 20 MG tablet Take 1 tablet (20 mg total) by mouth as directed. Take 20mg  at 1 AM and 10 mg in PM      . b complex vitamins tablet Take 1 tablet by mouth daily.      Marland Kitchen  cyclobenzaprine (FLEXERIL) 10 MG tablet TAKE ONE TABLET 3 TIMES A DAY AS NEEDED.FOR MUSCLE SPASMS  30 tablet  0  . DULoxetine (CYMBALTA) 30 MG capsule Take 90 mg by mouth daily.      . fluticasone (FLONASE) 50 MCG/ACT nasal spray Place 1 spray into the nose daily.  16 g  3  . meloxicam (MOBIC) 15 MG tablet Take 1 tablet (15 mg total) by mouth daily.  30 tablet  0  . ranitidine (ZANTAC) 150 MG tablet Take 1 tablet (150 mg total) by mouth 2 (two) times daily. For acid reflux.  60 tablet  11  . traZODone (DESYREL) 50 MG tablet Take 100 mg by mouth at bedtime.       No current facility-administered medications on file prior to visit.      Review of Systems System review is negative for any constitutional, cardiac, pulmonary, GI or neuro symptoms or complaints other than as described in the HPI.     Objective:   Physical Exam Filed Vitals:   11/12/12 0947  BP: 114/70  Pulse: 86  Temp: 96.6 F (35.9 C)   gen'l- WNWD white woman Derm - toe nail missing left great, almost missing right great nail, erythema of other nail beds. Not hot, no drainage.        Assessment & Plan:  Toe pain - multiple involved toes. Plan  Referral to podiatry - Dr. Harriet Pho

## 2012-11-19 ENCOUNTER — Ambulatory Visit (INDEPENDENT_AMBULATORY_CARE_PROVIDER_SITE_OTHER): Payer: BC Managed Care – PPO | Admitting: Psychiatry

## 2012-11-19 DIAGNOSIS — F331 Major depressive disorder, recurrent, moderate: Secondary | ICD-10-CM

## 2012-11-19 DIAGNOSIS — F988 Other specified behavioral and emotional disorders with onset usually occurring in childhood and adolescence: Secondary | ICD-10-CM

## 2012-11-20 ENCOUNTER — Encounter: Payer: Self-pay | Admitting: Internal Medicine

## 2012-11-20 DIAGNOSIS — Z1239 Encounter for other screening for malignant neoplasm of breast: Secondary | ICD-10-CM

## 2012-11-27 ENCOUNTER — Ambulatory Visit (INDEPENDENT_AMBULATORY_CARE_PROVIDER_SITE_OTHER): Payer: BC Managed Care – PPO | Admitting: Psychiatry

## 2012-11-27 DIAGNOSIS — F988 Other specified behavioral and emotional disorders with onset usually occurring in childhood and adolescence: Secondary | ICD-10-CM

## 2012-11-27 DIAGNOSIS — F331 Major depressive disorder, recurrent, moderate: Secondary | ICD-10-CM

## 2012-11-28 ENCOUNTER — Ambulatory Visit: Payer: BC Managed Care – PPO | Admitting: Psychiatry

## 2012-12-05 ENCOUNTER — Telehealth: Payer: Self-pay | Admitting: Internal Medicine

## 2012-12-05 ENCOUNTER — Ambulatory Visit (INDEPENDENT_AMBULATORY_CARE_PROVIDER_SITE_OTHER): Payer: BC Managed Care – PPO | Admitting: Psychiatry

## 2012-12-05 DIAGNOSIS — F331 Major depressive disorder, recurrent, moderate: Secondary | ICD-10-CM

## 2012-12-05 DIAGNOSIS — F988 Other specified behavioral and emotional disorders with onset usually occurring in childhood and adolescence: Secondary | ICD-10-CM

## 2012-12-05 NOTE — Telephone Encounter (Signed)
Pt is requesting a refill on Hydrocodone for foot pain.  She wants at least a weeks worth. She has an appointment next week with the podiatrist.

## 2012-12-06 MED ORDER — HYDROCODONE-ACETAMINOPHEN 5-325 MG PO TABS: 1.0000 | ORAL_TABLET | Freq: Four times a day (QID) | ORAL | Status: DC | PRN

## 2012-12-06 NOTE — Telephone Encounter (Signed)
Script printed and waiting for signature. Patient will be notified when ready.

## 2012-12-06 NOTE — Telephone Encounter (Signed)
Ok for #25 hydrocodone/APAP

## 2012-12-12 ENCOUNTER — Ambulatory Visit (INDEPENDENT_AMBULATORY_CARE_PROVIDER_SITE_OTHER): Payer: BC Managed Care – PPO | Admitting: Psychiatry

## 2012-12-12 DIAGNOSIS — F331 Major depressive disorder, recurrent, moderate: Secondary | ICD-10-CM

## 2012-12-12 DIAGNOSIS — F988 Other specified behavioral and emotional disorders with onset usually occurring in childhood and adolescence: Secondary | ICD-10-CM

## 2012-12-18 DIAGNOSIS — F331 Major depressive disorder, recurrent, moderate: Secondary | ICD-10-CM

## 2012-12-18 DIAGNOSIS — R413 Other amnesia: Secondary | ICD-10-CM

## 2012-12-19 ENCOUNTER — Ambulatory Visit: Payer: BC Managed Care – PPO | Admitting: Psychiatry

## 2012-12-19 ENCOUNTER — Ambulatory Visit: Payer: Self-pay | Admitting: Internal Medicine

## 2012-12-19 DIAGNOSIS — Z0289 Encounter for other administrative examinations: Secondary | ICD-10-CM

## 2012-12-24 DIAGNOSIS — F331 Major depressive disorder, recurrent, moderate: Secondary | ICD-10-CM

## 2012-12-24 DIAGNOSIS — R413 Other amnesia: Secondary | ICD-10-CM

## 2012-12-26 ENCOUNTER — Ambulatory Visit (INDEPENDENT_AMBULATORY_CARE_PROVIDER_SITE_OTHER): Payer: BC Managed Care – PPO | Admitting: Psychiatry

## 2012-12-26 DIAGNOSIS — F988 Other specified behavioral and emotional disorders with onset usually occurring in childhood and adolescence: Secondary | ICD-10-CM

## 2012-12-26 DIAGNOSIS — F331 Major depressive disorder, recurrent, moderate: Secondary | ICD-10-CM

## 2012-12-27 DIAGNOSIS — F331 Major depressive disorder, recurrent, moderate: Secondary | ICD-10-CM

## 2012-12-27 DIAGNOSIS — R413 Other amnesia: Secondary | ICD-10-CM

## 2013-01-02 ENCOUNTER — Ambulatory Visit: Payer: BC Managed Care – PPO | Admitting: Psychiatry

## 2013-01-09 ENCOUNTER — Ambulatory Visit (INDEPENDENT_AMBULATORY_CARE_PROVIDER_SITE_OTHER): Payer: BC Managed Care – PPO | Admitting: Psychiatry

## 2013-01-09 DIAGNOSIS — F988 Other specified behavioral and emotional disorders with onset usually occurring in childhood and adolescence: Secondary | ICD-10-CM

## 2013-01-09 DIAGNOSIS — F331 Major depressive disorder, recurrent, moderate: Secondary | ICD-10-CM

## 2013-01-14 ENCOUNTER — Ambulatory Visit (INDEPENDENT_AMBULATORY_CARE_PROVIDER_SITE_OTHER): Payer: BC Managed Care – PPO | Admitting: Psychiatry

## 2013-01-14 DIAGNOSIS — F988 Other specified behavioral and emotional disorders with onset usually occurring in childhood and adolescence: Secondary | ICD-10-CM

## 2013-01-14 DIAGNOSIS — F331 Major depressive disorder, recurrent, moderate: Secondary | ICD-10-CM

## 2013-01-15 ENCOUNTER — Ambulatory Visit: Payer: BC Managed Care – PPO | Admitting: Psychiatry

## 2013-01-29 ENCOUNTER — Encounter: Payer: Self-pay | Admitting: Internal Medicine

## 2013-01-30 ENCOUNTER — Ambulatory Visit: Payer: BC Managed Care – PPO | Admitting: Psychiatry

## 2013-01-30 ENCOUNTER — Ambulatory Visit (INDEPENDENT_AMBULATORY_CARE_PROVIDER_SITE_OTHER): Payer: BC Managed Care – PPO | Admitting: Psychiatry

## 2013-01-30 DIAGNOSIS — F331 Major depressive disorder, recurrent, moderate: Secondary | ICD-10-CM

## 2013-01-30 DIAGNOSIS — F988 Other specified behavioral and emotional disorders with onset usually occurring in childhood and adolescence: Secondary | ICD-10-CM

## 2013-02-05 ENCOUNTER — Telehealth: Payer: Self-pay | Admitting: Internal Medicine

## 2013-02-05 ENCOUNTER — Ambulatory Visit: Payer: Self-pay | Admitting: Family Medicine

## 2013-02-05 NOTE — Telephone Encounter (Signed)
Patient Information:  Caller Name: [place]  Phone: [phone]  Patient: Jordan [name], Jordan [name]  Gender: Female  DOB: [birth year]  Age: 52 Years  PCP: Illene Regulus (Adults only)  Pregnant: No  Office Follow Up:  Does the office need to follow up with this patient?: No  Instructions For The Office: N/A  RN Note:  Uterine ablation. Mild intermittent nausea. Declined appointment another office; she feels she can wait 5 hrs to be seen at [facility] office.    Symptoms  Reason For Call & Symptoms: Ongoing, intermittent diarrhea. Reports 8-10 stools daily.  Vomiting stopped after 2 days.  Reviewed Health History In EMR: Yes  Reviewed Medications In EMR: Yes  Reviewed Allergies In EMR: Yes  Reviewed Surgeries / Procedures: Yes  Date of Onset of Symptoms: 02/01/2013  Treatments Tried: Drinking water, Sprite, Jello, juice  Treatments Tried Worked: No OB / GYN:  LMP: Unknown  Guideline(s) Used:  Diarrhea  Disposition Per Guideline:   Go to Office Now  Reason For Disposition Reached:   Age < 60 years and has had > 15 diarrhea stools in past 24 hours  Advice Given:  Reassurance:  In healthy adults, new-onset diarrhea is usually caused by a viral infection of the intestines, which you can treat at home. Diarrhea is the body's way of getting rid of the infection. Here are some tips on how to keep ahead of the fluid losses.  Fluids:  Drink more fluids, at least 8-10 glasses (8 oz or 240 ml) daily.  For example: sports drinks, diluted fruit juices, soft drinks.  Supplement this with saltine crackers or soups to make certain that you are getting sufficient fluid and salt to meet your body's needs.  Avoid caffeinated beverages (Reason: caffeine is mildly dehydrating).  Nutrition:  Maintaining some food intake during episodes of diarrhea is important.  Ideal initial foods include boiled starches/cereals (e.g., potatoes, rice, noodles, wheat, oats) with a small amount of salt to taste.  Other  acceptable foods include: bananas, yogurt, crackers, soup.  As your stools return to normal consistency, resume a normal diet.  Diarrhea Medication - Bismuth Subsalicylate (e.g., Kaopectate, PeptoBismol):  Helps reduce diarrhea, vomiting, and abdominal cramping.  Do not use for more than 2 days  This medication can make the stools look dark or even black (but not red or tarry).  Expected Course:  Viral diarrhea lasts 4-7 days. Always worse on days 1 and 2.  Call Back If:  Signs of dehydration occur (e.g., no urine for more than 12 hours, very dry mouth, lightheaded, etc.)  Diarrhea lasts over 7 days  You become worse.  Patient Will Follow Care Advice:  YES  Appointment Scheduled:  02/05/2013 16:00:00 Appointment Scheduled Provider:  Terrilee Files

## 2013-02-06 ENCOUNTER — Ambulatory Visit: Payer: BC Managed Care – PPO | Admitting: Psychiatry

## 2013-02-18 ENCOUNTER — Ambulatory Visit: Payer: Self-pay | Admitting: Psychiatry

## 2013-02-27 ENCOUNTER — Ambulatory Visit (INDEPENDENT_AMBULATORY_CARE_PROVIDER_SITE_OTHER): Payer: BC Managed Care – PPO | Admitting: Psychiatry

## 2013-02-27 DIAGNOSIS — F331 Major depressive disorder, recurrent, moderate: Secondary | ICD-10-CM

## 2013-02-27 DIAGNOSIS — F988 Other specified behavioral and emotional disorders with onset usually occurring in childhood and adolescence: Secondary | ICD-10-CM

## 2013-03-06 ENCOUNTER — Ambulatory Visit: Payer: BC Managed Care – PPO | Admitting: Psychiatry

## 2013-03-11 ENCOUNTER — Encounter: Payer: Self-pay | Admitting: Internal Medicine

## 2013-03-13 ENCOUNTER — Ambulatory Visit (INDEPENDENT_AMBULATORY_CARE_PROVIDER_SITE_OTHER): Payer: BC Managed Care – PPO | Admitting: Psychiatry

## 2013-03-13 DIAGNOSIS — F988 Other specified behavioral and emotional disorders with onset usually occurring in childhood and adolescence: Secondary | ICD-10-CM

## 2013-03-13 DIAGNOSIS — F331 Major depressive disorder, recurrent, moderate: Secondary | ICD-10-CM

## 2013-03-20 ENCOUNTER — Ambulatory Visit: Payer: BC Managed Care – PPO | Admitting: Psychiatry

## 2013-03-27 ENCOUNTER — Ambulatory Visit: Payer: BC Managed Care – PPO | Admitting: Psychiatry

## 2013-03-31 ENCOUNTER — Other Ambulatory Visit: Payer: Self-pay | Admitting: Psychiatry

## 2013-03-31 DIAGNOSIS — R413 Other amnesia: Secondary | ICD-10-CM

## 2013-03-31 DIAGNOSIS — M6281 Muscle weakness (generalized): Secondary | ICD-10-CM

## 2013-04-03 ENCOUNTER — Encounter: Payer: Self-pay | Admitting: Internal Medicine

## 2013-04-03 ENCOUNTER — Ambulatory Visit (INDEPENDENT_AMBULATORY_CARE_PROVIDER_SITE_OTHER): Payer: BC Managed Care – PPO | Admitting: Psychiatry

## 2013-04-03 ENCOUNTER — Ambulatory Visit (INDEPENDENT_AMBULATORY_CARE_PROVIDER_SITE_OTHER): Payer: BC Managed Care – PPO | Admitting: Internal Medicine

## 2013-04-03 VITALS — BP 110/80 | HR 81 | Temp 98.9°F | Wt 125.4 lb

## 2013-04-03 DIAGNOSIS — G35 Multiple sclerosis: Secondary | ICD-10-CM

## 2013-04-03 DIAGNOSIS — Z1211 Encounter for screening for malignant neoplasm of colon: Secondary | ICD-10-CM

## 2013-04-03 DIAGNOSIS — M797 Fibromyalgia: Secondary | ICD-10-CM

## 2013-04-03 DIAGNOSIS — M5010 Cervical disc disorder with radiculopathy, unspecified cervical region: Secondary | ICD-10-CM

## 2013-04-03 DIAGNOSIS — IMO0001 Reserved for inherently not codable concepts without codable children: Secondary | ICD-10-CM

## 2013-04-03 DIAGNOSIS — F988 Other specified behavioral and emotional disorders with onset usually occurring in childhood and adolescence: Secondary | ICD-10-CM

## 2013-04-03 DIAGNOSIS — M533 Sacrococcygeal disorders, not elsewhere classified: Secondary | ICD-10-CM

## 2013-04-03 DIAGNOSIS — M5030 Other cervical disc degeneration, unspecified cervical region: Secondary | ICD-10-CM

## 2013-04-03 DIAGNOSIS — Z0000 Encounter for general adult medical examination without abnormal findings: Secondary | ICD-10-CM

## 2013-04-03 DIAGNOSIS — F331 Major depressive disorder, recurrent, moderate: Secondary | ICD-10-CM

## 2013-04-03 DIAGNOSIS — M5412 Radiculopathy, cervical region: Secondary | ICD-10-CM

## 2013-04-03 DIAGNOSIS — M545 Low back pain, unspecified: Secondary | ICD-10-CM

## 2013-04-03 NOTE — Assessment & Plan Note (Addendum)
Repeat MR of cervical spine because of new and progressive weakness and pain in RUE.

## 2013-04-03 NOTE — Assessment & Plan Note (Signed)
Patient needs a colonoscopy, will schedule in next several months.

## 2013-04-03 NOTE — Patient Instructions (Addendum)
It has been a pleasure providing medical care for you today.  1) Back Pain  - Rest! - Avoid lifting heavy things as possible.  2) Arm weakness - We will get a MRI of cervical spine to see if we can figure out a cause of your arm weakness.  3) Health Maintenance - Colonoscopy referral for June with Dr. Deatra Ina.  - Continue yearly mammograms.  - Maintain a healthy diet.      Lumbosacral Strain Lumbosacral strain is a strain of any of the parts that make up your lumbosacral vertebrae. Your lumbosacral vertebrae are the bones that make up the lower third of your backbone. Your lumbosacral vertebrae are held together by muscles and tough, fibrous tissue (ligaments).  CAUSES  A sudden blow to your back can cause lumbosacral strain. Also, anything that causes an excessive stretch of the muscles in the low back can cause this strain. This is typically seen when people exert themselves strenuously, fall, lift heavy objects, bend, or crouch repeatedly. RISK FACTORS  Physically demanding work.  Participation in pushing or pulling sports or sports that require sudden twist of the back (tennis, golf, baseball).  Weight lifting.  Excessive lower back curvature.  Forward-tilted pelvis.  Weak back or abdominal muscles or both.  Tight hamstrings. SIGNS AND SYMPTOMS  Lumbosacral strain may cause pain in the area of your injury or pain that moves (radiates) down your leg.  DIAGNOSIS Your health care provider can often diagnose lumbosacral strain through a physical exam. In some cases, you may need tests such as X-ray exams.  TREATMENT  Treatment for your lower back injury depends on many factors that your clinician will have to evaluate. However, most treatment will include the use of anti-inflammatory medicines. HOME CARE INSTRUCTIONS   Avoid hard physical activities (tennis, racquetball, waterskiing) if you are not in proper physical condition for it. This may aggravate or create  problems.  If you have a back problem, avoid sports requiring sudden body movements. Swimming and walking are generally safer activities.  Maintain good posture.  Maintain a healthy weight.  For acute conditions, you may put ice on the injured area.  Put ice in a plastic bag.  Place a towel between your skin and the bag.  Leave the ice on for 20 minutes, 2 3 times a day.  When the low back starts healing, stretching and strengthening exercises may be recommended. SEEK MEDICAL CARE IF:  Your back pain is getting worse.  You experience severe back pain not relieved with medicines. SEEK IMMEDIATE MEDICAL CARE IF:   You have numbness, tingling, weakness, or problems with the use of your arms or legs.  There is a change in bowel or bladder control.  You have increasing pain in any area of the body, including your belly (abdomen).  You notice shortness of breath, dizziness, or feel faint.  You feel sick to your stomach (nauseous), are throwing up (vomiting), or become sweaty.  You notice discoloration of your toes or legs, or your feet get very cold. MAKE SURE YOU:   Understand these instructions.  Will watch your condition.  Will get help right away if you are not doing well or get worse. Document Released: 11/16/2004 Document Revised: 11/27/2012 Document Reviewed: 09/25/2012 [facility] Patient Information 2014 [place], [place].

## 2013-04-03 NOTE — Progress Notes (Signed)
Pre visit review using our clinic review tool, if applicable. No additional management support is needed unless otherwise documented below in the visit note. 

## 2013-04-03 NOTE — Assessment & Plan Note (Signed)
Patient reports history of fibromyalgia. Sore through right arm in median nerve distribution today.

## 2013-04-03 NOTE — Progress Notes (Signed)
Subjective:     Patient ID: Jordan [name], female   DOB: 17-Sep-1960, 53 y.o.   MRN: [id number]  HPI Hurt lower back about two weeks ago. Has been lifting furniture while [place]. Can't recall an inciting event. Feels like "bone on bone" pain, sharp, 10/10 when it's at its worst. Laying down makes it hurt more. No loss of bowel or bladder function, no tingling.   R hand numb in mornings, very weak and painful in RUE. Different from her historical pain in the right neck. Reviewed her chart: MRI c-spine July '10 revealing mild to moderate DDD  Scheduled for MRI of brain +/- contrast in two weeks.   Past Medical History  Diagnosis Date  . Headache(784.0)   . Depressive disorder, not elsewhere classified   . Anemia, unspecified   . History of bulimia   . History of anorexia nervosa   . Meningitis, unspecified(322.9)   . Other specified glaucoma   . Allergic rhinitis due to pollen   . Acute upper respiratory infections of unspecified site   . Diffuse cystic mastopathy   . Lump or mass in breast   . Perforation of tympanic membrane, unspecified   . Anal fissure   . Anxiety disorder   . Fibromyalgia   . IBS (irritable bowel syndrome)   . Seizures   . Radial styloid tenosynovitis   . 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 Complication of anesthesia     wakes up slow   Past Surgical History  Procedure Laterality Date  . Dilation and curettage of uterus      History of Menometrorrhagia  . Wrist surgery      for de-quervaine - bilateral wrists - tendonitis  . Ankle surgery      right ankle - scar tissue  . Inner ear surgery      6 surgeries on right ear   Family History  Problem Relation Age of Onset  . Ovarian cancer Mother   . Heart disease Mother   . Hypertension Mother   . Coronary artery disease Mother   . Heart failure Father   . Hypertension Father   . Hyperlipidemia Father   . Diabetes Father   . Breast cancer Maternal Grandmother   . Colon cancer Neg Hx    History    Social History  . Marital Status: Divorced    Spouse Name: N/A    Number of Children: N/A  . Years of Education: N/A   Occupational History  . Not on file.   Social History Main Topics  . Smoking status: Never Smoker   . Smokeless tobacco: Never Used  . Alcohol Use: Yes     Comment: 2 glasses of wine/Day  . Drug Use: No  . Sexual Activity: Not on file   Other Topics Concern  . Not on file   Social History Narrative   HSG, [employer] BA, App for MA. [place], roads[place] rail [place] land. 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 Married 5 years - divorced, no children                  Current Outpatient Prescriptions on File Prior to Visit  Medication Sig Dispense Refill  . ALPRAZolam (XANAX) 0.5 MG tablet Take 0.5 mg by mouth at bedtime as needed. For sleep.      . ARIPiprazole (ABILIFY) 2 MG tablet Take 5 mg by mouth at bedtime.       Marland Kitchen  DULoxetine (CYMBALTA) 30 MG capsule Take 90 mg by mouth daily.      . fluticasone (FLONASE) 50 MCG/ACT nasal spray Place 1 spray into the nose daily.  16 g  3  . meloxicam (MOBIC) 15 MG tablet Take 1 tablet (15 mg total) by mouth daily.  30 tablet  0  . ranitidine (ZANTAC) 150 MG tablet Take 1 tablet (150 mg total) by mouth 2 (two) times daily. For acid reflux.  60 tablet  11  . traZODone (DESYREL) 50 MG tablet Take 100 mg by mouth at bedtime.       No current facility-administered medications on file prior to visit.     Review of Systems System review is negative for any constitutional, cardiac, pulmonary, GI or neuro symptoms or complaints other than as described in the HPI.     Objective:   Physical Exam General: Well developed, well nourished, NAD, appears stated age HEENT: NCAT, PERRLA, EOMI, Anicteic Sclera, mucous membranes moist.  Neck: Supple, no JVD, no masses  Cardiovascular: S1 S2 auscultated, no rubs, murmurs or gallops. Regular rate and rhythm.  Respiratory: Clear to  auscultation bilaterally with equal chest rise  Abdomen: Soft, nontender, nondistended, + bowel sounds  Extremities: warm, dry without cyanosis, clubbing, or edema  Neuro: Alert and Oriented x3, cranial nerves II-XII grossly intact. Reduced sensation to light touch on R side. Normal strength in facial muscles.  MSK: 5/5 strength in biceps, triceps, deltoid, knee extensors / flexors, foot extensors/ flexors on Left side, 4-/5 strength  biceps, triceps, deltoid, knee extensors / flexors, foot extensors/ flexors on right side.  Skin: Without rashes, exudates, or nodules  Psych: Normal mood, constricted affect with intact judgement and insight Back exam: normal stand; normal flex to greater than 100 degrees; normal gait; normal toe/heel walk; normal step up to exam table; normal SLR sitting; normal DTRs at the patellar tendons; Decreased sensation to light touch, pin-prick and deep vibratory stimulus on Right side; no  CVA tenderness; able to move supine to sitting witout assistance. Pain to palpation over lower lumbar spine (L5/S1).   Pertinent labs and images reviewed. MR cervical spine: July '10 Findings: The cervical cord has normal signal. No cord lesions or  abnormal enhancement is identified. The cervical alignment is  normal. No vertebral body lesion is identified.  C2-3: Negative  C3-4: Mild disc degeneration and early spurring on the left.  C4-5: Small central disc protrusion. Mild uncovertebral spurring  on the right without spinal stenosis.  C5-6: Small central disc protrusion and mild uncovertebral  spurring bilaterally. No significant spinal stenosis.  C6-7: Mild disc degeneration and early uncovertebral spurring  without stenosis.  C7-T1: Negative  IMPRESSION:  No cord lesions are identified.  Mild cervical degenerative changes, similar to the prior study. No  new findings.        Assessment:     Per problem list     Plan:     Per problem list

## 2013-04-03 NOTE — Assessment & Plan Note (Signed)
Patient reports a two week history of lumbosacral pain. Has been moving furniture around her house. Back exam normal, except for tenderness to palpation at L5/S1.

## 2013-04-03 NOTE — Assessment & Plan Note (Signed)
Patient will have MRI of brain +/- contrast at the end of Feb '15 at [facility].

## 2013-04-07 ENCOUNTER — Other Ambulatory Visit: Payer: Self-pay | Admitting: *Deleted

## 2013-04-07 ENCOUNTER — Telehealth: Payer: Self-pay | Admitting: *Deleted

## 2013-04-07 MED ORDER — MELOXICAM 15 MG PO TABS: 15.0000 mg | ORAL_TABLET | Freq: Every day | ORAL | Status: DC

## 2013-04-07 NOTE — Telephone Encounter (Signed)
Script printed & awaiting your signature

## 2013-04-07 NOTE — Telephone Encounter (Signed)
Continue meloxicam May take APAP 1,000 mg three times a day. This is the safe limit.

## 2013-04-07 NOTE — Telephone Encounter (Signed)
Patient states PCP ordered and MRI for patient, which is scheduled for 04/14/13.  Patient states she suffers from claustrophobia and is in need of an anti-anxiety.  NKDA listed.  Please listed.  CB# [phone]

## 2013-04-07 NOTE — Telephone Encounter (Signed)
Faxed script to pharmacy and notified patient via her personal voicemail.

## 2013-04-07 NOTE — Telephone Encounter (Signed)
Patient stated she was out of the mobic you recommended she continue.

## 2013-04-07 NOTE — Telephone Encounter (Signed)
Patient states she also needs something for the pain-back/neck pain.  Please advise.

## 2013-04-08 ENCOUNTER — Other Ambulatory Visit: Payer: Self-pay

## 2013-04-10 ENCOUNTER — Ambulatory Visit (INDEPENDENT_AMBULATORY_CARE_PROVIDER_SITE_OTHER): Payer: BC Managed Care – PPO | Admitting: Psychiatry

## 2013-04-10 DIAGNOSIS — F331 Major depressive disorder, recurrent, moderate: Secondary | ICD-10-CM

## 2013-04-10 DIAGNOSIS — F988 Other specified behavioral and emotional disorders with onset usually occurring in childhood and adolescence: Secondary | ICD-10-CM

## 2013-04-14 ENCOUNTER — Ambulatory Visit
Admission: RE | Admit: 2013-04-14 | Discharge: 2013-04-14 | Disposition: A | Discharge status: Home / Self Care | Payer: BC Managed Care – PPO | Source: Ambulatory Visit | Attending: Psychiatry | Admitting: Psychiatry

## 2013-04-14 ENCOUNTER — Ambulatory Visit
Admission: RE | Admit: 2013-04-14 | Discharge: 2013-04-14 | Disposition: A | Discharge status: Home / Self Care | Payer: BC Managed Care – PPO | Source: Ambulatory Visit | Attending: Internal Medicine | Admitting: Internal Medicine

## 2013-04-14 DIAGNOSIS — M5010 Cervical disc disorder with radiculopathy, unspecified cervical region: Secondary | ICD-10-CM

## 2013-04-14 DIAGNOSIS — M6281 Muscle weakness (generalized): Secondary | ICD-10-CM

## 2013-04-14 DIAGNOSIS — R413 Other amnesia: Secondary | ICD-10-CM

## 2013-04-14 MED ORDER — GADOBENATE DIMEGLUMINE 529 MG/ML IV SOLN
11.0000 mL | Freq: Once | INTRAVENOUS | Status: AC | PRN
  Administered 2013-04-14: 11 mL via INTRAVENOUS

## 2013-04-15 ENCOUNTER — Encounter: Payer: Self-pay | Admitting: Internal Medicine

## 2013-04-16 ENCOUNTER — Ambulatory Visit: Payer: BC Managed Care – PPO | Admitting: Psychiatry

## 2013-04-23 ENCOUNTER — Ambulatory Visit: Payer: BC Managed Care – PPO | Admitting: Psychiatry

## 2013-04-23 ENCOUNTER — Ambulatory Visit (INDEPENDENT_AMBULATORY_CARE_PROVIDER_SITE_OTHER): Payer: BC Managed Care – PPO | Admitting: Psychiatry

## 2013-04-23 DIAGNOSIS — F331 Major depressive disorder, recurrent, moderate: Secondary | ICD-10-CM

## 2013-04-23 DIAGNOSIS — F988 Other specified behavioral and emotional disorders with onset usually occurring in childhood and adolescence: Secondary | ICD-10-CM

## 2013-04-27 ENCOUNTER — Encounter: Payer: Self-pay | Admitting: Internal Medicine

## 2013-05-08 ENCOUNTER — Ambulatory Visit: Payer: BC Managed Care – PPO | Admitting: Psychiatry

## 2013-05-08 ENCOUNTER — Ambulatory Visit (INDEPENDENT_AMBULATORY_CARE_PROVIDER_SITE_OTHER): Payer: BC Managed Care – PPO | Admitting: Psychiatry

## 2013-05-08 DIAGNOSIS — F331 Major depressive disorder, recurrent, moderate: Secondary | ICD-10-CM

## 2013-05-08 DIAGNOSIS — F988 Other specified behavioral and emotional disorders with onset usually occurring in childhood and adolescence: Secondary | ICD-10-CM

## 2013-05-22 ENCOUNTER — Ambulatory Visit: Payer: BC Managed Care – PPO | Admitting: Psychiatry

## 2013-05-22 ENCOUNTER — Ambulatory Visit (INDEPENDENT_AMBULATORY_CARE_PROVIDER_SITE_OTHER): Payer: BC Managed Care – PPO | Admitting: Psychiatry

## 2013-05-22 DIAGNOSIS — F988 Other specified behavioral and emotional disorders with onset usually occurring in childhood and adolescence: Secondary | ICD-10-CM

## 2013-05-22 DIAGNOSIS — F331 Major depressive disorder, recurrent, moderate: Secondary | ICD-10-CM

## 2013-06-03 ENCOUNTER — Telehealth: Payer: Self-pay

## 2013-06-03 NOTE — Telephone Encounter (Signed)
The patient called and is hoping to get an injection for pain by Dr.Plotnikov.  She called the [place] office to transfer her primary care there, but they told her they would be unable to take her on for a 6 weeks.   Do you want her scheduled for a pain injection?   Thanks!

## 2013-06-04 NOTE — Telephone Encounter (Signed)
What is the injection? Where? Thx

## 2013-06-04 NOTE — Telephone Encounter (Signed)
Sched OV

## 2013-06-09 ENCOUNTER — Encounter: Payer: Self-pay | Admitting: Gastroenterology

## 2013-06-19 ENCOUNTER — Ambulatory Visit (INDEPENDENT_AMBULATORY_CARE_PROVIDER_SITE_OTHER): Payer: BC Managed Care – PPO | Admitting: Psychiatry

## 2013-06-19 DIAGNOSIS — F331 Major depressive disorder, recurrent, moderate: Secondary | ICD-10-CM

## 2013-06-19 DIAGNOSIS — F988 Other specified behavioral and emotional disorders with onset usually occurring in childhood and adolescence: Secondary | ICD-10-CM

## 2013-07-03 ENCOUNTER — Ambulatory Visit (INDEPENDENT_AMBULATORY_CARE_PROVIDER_SITE_OTHER): Payer: BC Managed Care – PPO | Admitting: Psychiatry

## 2013-07-03 DIAGNOSIS — F331 Major depressive disorder, recurrent, moderate: Secondary | ICD-10-CM

## 2013-07-03 DIAGNOSIS — F988 Other specified behavioral and emotional disorders with onset usually occurring in childhood and adolescence: Secondary | ICD-10-CM

## 2013-07-16 ENCOUNTER — Ambulatory Visit (INDEPENDENT_AMBULATORY_CARE_PROVIDER_SITE_OTHER): Payer: BC Managed Care – PPO | Admitting: Psychiatry

## 2013-07-16 DIAGNOSIS — F331 Major depressive disorder, recurrent, moderate: Secondary | ICD-10-CM

## 2013-07-16 DIAGNOSIS — F988 Other specified behavioral and emotional disorders with onset usually occurring in childhood and adolescence: Secondary | ICD-10-CM

## 2013-07-30 ENCOUNTER — Ambulatory Visit (AMBULATORY_SURGERY_CENTER): Payer: BC Managed Care – PPO | Admitting: *Deleted

## 2013-07-30 VITALS — Ht 63.0 in | Wt 129.0 lb

## 2013-07-30 DIAGNOSIS — Z1211 Encounter for screening for malignant neoplasm of colon: Secondary | ICD-10-CM

## 2013-07-30 MED ORDER — NA SULFATE-K SULFATE-MG SULF 17.5-3.13-1.6 GM/177ML PO SOLN: 1.0000 | Freq: Once | ORAL | Status: DC

## 2013-07-30 NOTE — Progress Notes (Signed)
No allergies to eggs or soy. No problems with anesthesia.  Pt given Emmi instructions for colonoscopy  No oxygen use  No diet drug use  

## 2013-07-31 ENCOUNTER — Encounter: Payer: Self-pay | Admitting: Gastroenterology

## 2013-07-31 ENCOUNTER — Ambulatory Visit: Payer: BC Managed Care – PPO | Admitting: Psychiatry

## 2013-08-12 ENCOUNTER — Telehealth: Payer: Self-pay | Admitting: Gastroenterology

## 2013-08-12 NOTE — Telephone Encounter (Signed)
Patient states that she is still coughing up mucus and has "bronchitis".  She went to Urgent Care 2 weeks ago and saw a doctor.  The doctor gave her a spray and advised her to take Mucinex.  No antibiotic was given.  She states that she is still coughing but no fever.  Patient states that she wants to reschedule her procedure. She is concerned about coughing while sedated and wants to wait until she feels better.I transferred her to [facility] on 3rd floor To reschedule.

## 2013-08-13 ENCOUNTER — Encounter: Payer: Self-pay | Admitting: Gastroenterology

## 2013-08-14 ENCOUNTER — Ambulatory Visit (INDEPENDENT_AMBULATORY_CARE_PROVIDER_SITE_OTHER): Payer: BC Managed Care – PPO | Admitting: Psychiatry

## 2013-08-14 DIAGNOSIS — F988 Other specified behavioral and emotional disorders with onset usually occurring in childhood and adolescence: Secondary | ICD-10-CM

## 2013-08-14 DIAGNOSIS — F331 Major depressive disorder, recurrent, moderate: Secondary | ICD-10-CM

## 2013-08-15 ENCOUNTER — Encounter: Payer: Self-pay | Admitting: Internal Medicine

## 2013-08-15 ENCOUNTER — Ambulatory Visit (INDEPENDENT_AMBULATORY_CARE_PROVIDER_SITE_OTHER): Payer: BC Managed Care – PPO | Admitting: Internal Medicine

## 2013-08-15 VITALS — BP 122/80 | HR 85 | Temp 98.3°F | Resp 20 | Ht 62.5 in | Wt 134.0 lb

## 2013-08-15 DIAGNOSIS — Z0000 Encounter for general adult medical examination without abnormal findings: Secondary | ICD-10-CM

## 2013-08-15 DIAGNOSIS — M797 Fibromyalgia: Secondary | ICD-10-CM

## 2013-08-15 DIAGNOSIS — F3289 Other specified depressive episodes: Secondary | ICD-10-CM

## 2013-08-15 DIAGNOSIS — F329 Major depressive disorder, single episode, unspecified: Secondary | ICD-10-CM

## 2013-08-15 DIAGNOSIS — IMO0001 Reserved for inherently not codable concepts without codable children: Secondary | ICD-10-CM

## 2013-08-15 DIAGNOSIS — G35 Multiple sclerosis: Secondary | ICD-10-CM

## 2013-08-15 DIAGNOSIS — R413 Other amnesia: Secondary | ICD-10-CM

## 2013-08-15 DIAGNOSIS — G35D Multiple sclerosis, unspecified: Secondary | ICD-10-CM

## 2013-08-15 DIAGNOSIS — G3184 Mild cognitive impairment, so stated: Secondary | ICD-10-CM

## 2013-08-15 DIAGNOSIS — D649 Anemia, unspecified: Secondary | ICD-10-CM

## 2013-08-15 LAB — CBC WITH DIFFERENTIAL/PLATELET
Basophils Absolute: 0 10*3/uL (ref 0.0–0.1)
Basophils Relative: 0.4 % (ref 0.0–3.0)
Eosinophils Absolute: 0.2 10*3/uL (ref 0.0–0.7)
Eosinophils Relative: 4.3 % (ref 0.0–5.0)
HCT: 40.6 % (ref 36.0–46.0)
Hemoglobin: 14.1 g/dL (ref 12.0–15.0)
Lymphocytes Relative: 28.3 % (ref 12.0–46.0)
Lymphs Abs: 1.6 10*3/uL (ref 0.7–4.0)
MCHC: 34.7 g/dL (ref 30.0–36.0)
MCV: 95.2 fl (ref 78.0–100.0)
Monocytes Absolute: 0.5 10*3/uL (ref 0.1–1.0)
Monocytes Relative: 8.7 % (ref 3.0–12.0)
Neutro Abs: 3.2 10*3/uL (ref 1.4–7.7)
Neutrophils Relative %: 58.3 % (ref 43.0–77.0)
Platelets: 205 10*3/uL (ref 150.0–400.0)
RBC: 4.26 Mil/uL (ref 3.87–5.11)
RDW: 12.9 % (ref 11.5–15.5)
WBC: 5.6 10*3/uL (ref 4.0–10.5)

## 2013-08-15 LAB — COMPREHENSIVE METABOLIC PANEL
ALT: 20 U/L (ref 0–35)
AST: 22 U/L (ref 0–37)
Albumin: 4.5 g/dL (ref 3.5–5.2)
Alkaline Phosphatase: 75 U/L (ref 39–117)
BUN: 13 mg/dL (ref 6–23)
CO2: 30 mEq/L (ref 19–32)
Calcium: 9.7 mg/dL (ref 8.4–10.5)
Chloride: 104 mEq/L (ref 96–112)
Creatinine, Ser: 1 mg/dL (ref 0.4–1.2)
GFR: 59.57 mL/min — ABNORMAL LOW (ref >60.00)
Glucose, Bld: 91 mg/dL (ref 70–99)
Potassium: 4.3 mEq/L (ref 3.5–5.1)
Sodium: 142 mEq/L (ref 135–145)
Total Bilirubin: 0.6 mg/dL (ref 0.2–1.2)
Total Protein: 7.5 g/dL (ref 6.0–8.3)

## 2013-08-15 LAB — LIPID PANEL
Cholesterol: 210 mg/dL — ABNORMAL HIGH (ref 0–200)
HDL: 54.7 mg/dL (ref >39.00)
LDL Cholesterol: 106 mg/dL — ABNORMAL HIGH (ref 0–99)
NonHDL: 155.3
Total CHOL/HDL Ratio: 4
Triglycerides: 248 mg/dL — ABNORMAL HIGH (ref 0.0–149.0)
VLDL: 49.6 mg/dL — ABNORMAL HIGH (ref 0.0–40.0)

## 2013-08-15 LAB — VITAMIN D 25 HYDROXY (VIT D DEFICIENCY, FRACTURES): VITD: 27.67 ng/mL

## 2013-08-15 LAB — VITAMIN B12: Vitamin B-12: 581 pg/mL (ref 211–911)

## 2013-08-15 LAB — TSH: TSH: 1.58 u[IU]/mL (ref 0.35–4.50)

## 2013-08-15 LAB — HEMOGLOBIN A1C: Hgb A1c MFr Bld: 5 % (ref 4.6–6.5)

## 2013-08-15 MED ORDER — FLUTICASONE PROPIONATE 50 MCG/ACT NA SUSP: 1.0000 | Freq: Every day | NASAL | Status: DC

## 2013-08-15 MED ORDER — BACLOFEN 20 MG PO TABS: 10.0000 mg | ORAL_TABLET | Freq: Three times a day (TID) | ORAL | Status: DC

## 2013-08-15 MED ORDER — MELOXICAM 15 MG PO TABS: 15.0000 mg | ORAL_TABLET | Freq: Every day | ORAL | Status: DC

## 2013-08-15 NOTE — Progress Notes (Signed)
Subjective:    Past Medical History  Diagnosis Date  . Headache(784.0)   . Depressive disorder, not elsewhere classified   . Anemia, unspecified   . History of bulimia   . History of anorexia nervosa   . Meningitis, unspecified(322.9)   . Other specified glaucoma   . Allergic rhinitis due to pollen   . Acute upper respiratory infections of unspecified site   . Diffuse cystic mastopathy   . Lump or mass in breast   . Perforation of tympanic membrane, unspecified   . Anal fissure   . Anxiety disorder   . Fibromyalgia   . IBS (irritable bowel syndrome)   . Seizures   . Radial styloid tenosynovitis   . 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 Complication of anesthesia     wakes up slow  . Anxiety   . GERD (gastroesophageal reflux disease)     History   Social History  . Marital Status: Divorced    Spouse Name: N/A    Number of Children: N/A  . Years of Education: N/A   Occupational History  . Not on file.   Social History Main Topics  . Smoking status: Never Smoker   . Smokeless tobacco: Never Used  . Alcohol Use: 8.4 oz/week    14 Glasses of wine per week     Comment: 2 glasses of wine/Day  . Drug Use: No  . Sexual Activity: Not on file   Other Topics Concern  . Not on file   Social History Narrative   HSG, [employer] BA, App for MA. [place], roads, rail and land. 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 Married 5 years - divorced, no children                   Past Surgical History  Procedure Laterality Date  . Dilation and curettage of uterus  2006    History of Menometrorrhagia  . Wrist surgery  2001    for de-quervaine - bilateral wrists - tendonitis  . Ankle surgery  1992    right ankle - scar tissue  . Inner ear surgery  1966 to 1986    6 surgeries on right ear  . Uterine ablation  2011  . Laparoscopy  1993    normal    Family History  Problem Relation Age of Onset  . Ovarian cancer Mother   .  Heart disease Mother   . Hypertension Mother   . Coronary artery disease Mother   . Colon polyps Mother 75  . Heart failure Father   . Hypertension Father   . Hyperlipidemia Father   . Diabetes Father   . Breast cancer Maternal Grandmother   . Colon cancer Neg Hx     No Known Allergies  Current Outpatient Prescriptions on File Prior to Visit  Medication Sig Dispense Refill  . ALPRAZolam (XANAX) 0.5 MG tablet Take 0.5 mg by mouth at bedtime as needed. For sleep.      Marland Kitchen amphetamine-dextroamphetamine (ADDERALL) 30 MG tablet Take 30 mg by mouth 2 (two) times daily.       . ARIPiprazole (ABILIFY) 5 MG tablet Take 10 mg by mouth daily.       . DULoxetine (CYMBALTA) 60 MG capsule Take by mouth 2 (two) times daily.       . fluticasone (FLONASE) 50 MCG/ACT nasal spray Place 1 spray into the nose daily.  16 g  3  . Na Sulfate-K Sulfate-Mg Sulf (SUPREP BOWEL PREP) SOLN Take 1 kit by mouth once. suprep as directed.  No substitutions  354 mL  0  . traZODone (DESYREL) 50 MG tablet Take 100 mg by mouth at bedtime.       No current facility-administered medications on file prior to visit.    BP 122/80  Pulse 85  Temp(Src) 98.3 F (36.8 C) (Oral)  Resp 20  Ht 5' 2.5" (1.588 m)  Wt 134 lb (60.782 kg)  BMI 24.10 kg/m2  SpO2 98%     Patient ID: Jordan [name], female    DOB: 1960/10/26, 53 y.o.   MRN: [id number]  HPI 53 year old patient who is seen today to establish with our practice.  She is followed by Dr. Toy Care for major depression and has been disabled.  She is a former patient of Dr. Linda Hedges. There has been some suspicion for MS but this has been questionable and she has never required treatment.  She does have a history of fibromyalgia, glaucoma, and cervical disc disease. Social history she has been disabled due to her major depression, worked for the state as an Chief Financial Officer for 28 years.  Goes to the [place] 3 times per week Family history father died of complications of heart failure.   Mother had cervical cancer and died of complications of DVT   Review of Systems  HENT: Negative for congestion, dental problem, hearing loss, rhinorrhea, sinus pressure, sore throat and tinnitus.   Eyes: Negative for pain, discharge and visual disturbance.  Respiratory: Negative for cough and shortness of breath.   Cardiovascular: Negative for chest pain, palpitations and leg swelling.  Gastrointestinal: Negative for nausea, vomiting, abdominal pain, diarrhea, constipation, blood in stool and abdominal distention.  Genitourinary: Negative for dysuria, urgency, frequency, hematuria, flank pain, vaginal bleeding, vaginal discharge, difficulty urinating, vaginal pain and pelvic pain.  Musculoskeletal: Positive for arthralgias and back pain. Negative for gait problem and joint swelling.  Skin: Negative for rash.  Neurological: Positive for weakness and numbness. Negative for dizziness, syncope, speech difficulty and headaches.  Hematological: Negative for adenopathy.  Psychiatric/Behavioral: Positive for dysphoric mood. Negative for behavioral problems and agitation. The patient is nervous/anxious.        Objective:   Physical Exam  Constitutional: She is oriented to person, place, and time. She appears well-developed and well-nourished.  HENT:  Head: Normocephalic and atraumatic.  Right Ear: External ear normal.  Left Ear: External ear normal.  Mouth/Throat: Oropharynx is clear and moist.  Eyes: Conjunctivae and EOM are normal.  Neck: Normal range of motion. Neck supple. No JVD present. No thyromegaly present.  Cardiovascular: Normal rate, regular rhythm, normal heart sounds and intact distal pulses.   No murmur heard. Pulmonary/Chest: Effort normal and breath sounds normal. She has no wheezes. She has no rales.  Abdominal: Soft. Bowel sounds are normal. She exhibits no distension and no mass. There is no tenderness. There is no rebound and no guarding.  Musculoskeletal: Normal range of  motion. She exhibits no edema and no tenderness.  Neurological: She is alert and oriented to person, place, and time. She has normal reflexes. No cranial nerve deficit. She exhibits normal muscle tone. Coordination normal.  Decreased sensation right lower leg to monofilament testing and vibratory sensation  Skin: Skin is warm and dry. No rash noted.  Psychiatric: She has a normal mood and affect. Her behavior is normal.          Assessment & Plan:  Preventive health examination Maj. Depression Fibromyalgia Cervical disc disease History of anemia  Laboratory data will be reviewed medications updated Recheck 6 months Followup psychiatry

## 2013-08-15 NOTE — Patient Instructions (Addendum)
It is important that you exercise regularly, at least 20 minutes 3 to 4 times per week.  If you develop chest pain or shortness of breath seek  medical attention.  Take a calcium supplement, plus [phone] units of vitamin D  Return in 6 months for follow-up  Schedule your colonoscopy to help detect colon cancer.  Schedule your mammogram.

## 2013-08-15 NOTE — Progress Notes (Signed)
Pre-visit discussion using our clinic review tool. No additional management support is needed unless otherwise documented below in the visit note.  

## 2013-08-16 LAB — FOLATE RBC: RBC Folate: 233 ng/mL — ABNORMAL LOW (ref >280)

## 2013-08-28 ENCOUNTER — Telehealth: Payer: Self-pay | Admitting: Internal Medicine

## 2013-08-28 ENCOUNTER — Ambulatory Visit: Payer: BC Managed Care – PPO | Admitting: Psychiatry

## 2013-08-28 NOTE — Telephone Encounter (Signed)
Pt needs blood work results °

## 2013-09-01 NOTE — Telephone Encounter (Signed)
Left message on voicemail to call office.  

## 2013-09-01 NOTE — Telephone Encounter (Signed)
Please call/notify patient that lab/test/procedure is normal  Cholesterol up to 210 from 185

## 2013-09-01 NOTE — Telephone Encounter (Signed)
Pt called back, told her labs normal except Cholesterol up to 210 from 185. Pt verbalized understanding and asked if she could pick up a copy of labs. Told her will put copy at front desk. Pt verbalized understanding.

## 2013-09-11 ENCOUNTER — Ambulatory Visit: Payer: BC Managed Care – PPO | Admitting: Psychiatry

## 2013-09-15 ENCOUNTER — Encounter: Payer: Self-pay | Admitting: Internal Medicine

## 2013-09-15 ENCOUNTER — Other Ambulatory Visit: Payer: Self-pay | Admitting: Internal Medicine

## 2013-09-15 ENCOUNTER — Other Ambulatory Visit: Payer: Self-pay | Admitting: *Deleted

## 2013-09-15 DIAGNOSIS — Z1231 Encounter for screening mammogram for malignant neoplasm of breast: Secondary | ICD-10-CM

## 2013-09-16 NOTE — Telephone Encounter (Signed)
Left message on voicemail to call office.  

## 2013-09-25 ENCOUNTER — Ambulatory Visit (INDEPENDENT_AMBULATORY_CARE_PROVIDER_SITE_OTHER): Payer: BC Managed Care – PPO | Admitting: Psychiatry

## 2013-09-25 DIAGNOSIS — F331 Major depressive disorder, recurrent, moderate: Secondary | ICD-10-CM | POA: Diagnosis not present

## 2013-09-25 DIAGNOSIS — F988 Other specified behavioral and emotional disorders with onset usually occurring in childhood and adolescence: Secondary | ICD-10-CM

## 2013-10-02 ENCOUNTER — Ambulatory Visit (HOSPITAL_COMMUNITY): Payer: BC Managed Care – PPO | Attending: Internal Medicine

## 2013-10-08 ENCOUNTER — Ambulatory Visit: Payer: BC Managed Care – PPO | Admitting: Internal Medicine

## 2013-10-08 DIAGNOSIS — Z0289 Encounter for other administrative examinations: Secondary | ICD-10-CM

## 2013-10-09 ENCOUNTER — Ambulatory Visit (INDEPENDENT_AMBULATORY_CARE_PROVIDER_SITE_OTHER): Payer: BC Managed Care – PPO | Admitting: Psychiatry

## 2013-10-09 DIAGNOSIS — F331 Major depressive disorder, recurrent, moderate: Secondary | ICD-10-CM

## 2013-10-09 DIAGNOSIS — F988 Other specified behavioral and emotional disorders with onset usually occurring in childhood and adolescence: Secondary | ICD-10-CM

## 2013-10-13 ENCOUNTER — Ambulatory Visit: Payer: BC Managed Care – PPO | Admitting: Internal Medicine

## 2013-10-13 ENCOUNTER — Telehealth: Payer: Self-pay | Admitting: Internal Medicine

## 2013-10-13 NOTE — Telephone Encounter (Signed)
Left detailed message that information about cholesterol was put in the mail for you, call if any questions.

## 2013-10-13 NOTE — Telephone Encounter (Signed)
Pt has newly dx of high cholesterol. Would like to know if we have any literature.

## 2013-10-15 ENCOUNTER — Encounter: Payer: Self-pay | Admitting: Gastroenterology

## 2013-10-15 ENCOUNTER — Ambulatory Visit (AMBULATORY_SURGERY_CENTER): Payer: BC Managed Care – PPO | Admitting: Gastroenterology

## 2013-10-15 VITALS — BP 124/76 | HR 63 | Temp 98.4°F | Resp 14 | Ht 63.0 in | Wt 129.0 lb

## 2013-10-15 DIAGNOSIS — Z1211 Encounter for screening for malignant neoplasm of colon: Secondary | ICD-10-CM

## 2013-10-15 MED ORDER — SODIUM CHLORIDE 0.9 % IV SOLN: 500.0000 mL | INTRAVENOUS | Status: DC

## 2013-10-15 NOTE — Patient Instructions (Signed)
Findings:  Normal Recommendations:  Repeat colonoscopy in 10 years  YOU HAD AN ENDOSCOPIC PROCEDURE TODAY AT [facility]: Refer to the procedure report that was given to you for any specific questions about what was found during the examination.  If the procedure report does not answer your questions, please call your gastroenterologist to clarify.  If you requested that your care partner not be given the details of your procedure findings, then the procedure report has been included in a sealed envelope for you to review at your convenience later.  YOU SHOULD EXPECT: Some feelings of bloating in the abdomen. Passage of more gas than usual.  Walking can help get rid of the air that was put into your GI tract during the procedure and reduce the bloating. If you had a lower endoscopy (such as a colonoscopy or flexible sigmoidoscopy) you may notice spotting of blood in your stool or on the toilet paper. If you underwent a bowel prep for your procedure, then you may not have a normal bowel movement for a few days.  DIET: Your first meal following the procedure should be a light meal and then it is ok to progress to your normal diet.  A half-sandwich or bowl of soup is an example of a good first meal.  Heavy or fried foods are harder to digest and may make you feel nauseous or bloated.  Likewise meals heavy in dairy and vegetables can cause extra gas to form and this can also increase the bloating.  Drink plenty of fluids but you should avoid alcoholic beverages for 24 hours.  ACTIVITY: Your care partner should take you home directly after the procedure.  You should plan to take it easy, moving slowly for the rest of the day.  You can resume normal activity the day after the procedure however you should NOT DRIVE or use heavy machinery for 24 hours (because of the sedation medicines used during the test).    SYMPTOMS TO REPORT IMMEDIATELY: A gastroenterologist can be reached at any hour.   During normal business hours, 8:30 AM to 5:00 PM Monday through Friday, call [phone].  After hours and on weekends, please call the GI answering service at [phone] who will take a message and have the physician on call contact you.   Following lower endoscopy (colonoscopy or flexible sigmoidoscopy):  Excessive amounts of blood in the stool  Significant tenderness or worsening of abdominal pains  Swelling of the abdomen that is new, acute  Fever of 100F or higher  Following upper endoscopy (EGD)  Vomiting of blood or coffee ground material  New chest pain or pain under the shoulder blades  Painful or persistently difficult swallowing  New shortness of breath  Fever of 100F or higher  Black, tarry-looking stools  FOLLOW UP: If any biopsies were taken you will be contacted by phone or by letter within the next 1-3 weeks.  Call your gastroenterologist if you have not heard about the biopsies in 3 weeks.  Our staff will call the home number listed on your records the next business day following your procedure to check on you and address any questions or concerns that you may have at that time regarding the information given to you following your procedure. This is a courtesy call and so if there is no answer at the home number and we have not heard from you through the emergency physician on call, we will assume that you have returned to your  regular daily activities without incident.  SIGNATURES/CONFIDENTIALITY: You and/or your care partner have signed paperwork which will be entered into your electronic medical record.  These signatures attest to the fact that that the information above on your After Visit Summary has been reviewed and is understood.  Full responsibility of the confidentiality of this discharge information lies with you and/or your care-partner.  Please follow all discharge instructions given to you by the recovery room nurse. If you have any questions or  problems after discharge please call one of the numbers listed above. You will receive a phone call in the am to see how you are doing and answer any questions you may have. Thank you for choosing [facility] for your health care needs.

## 2013-10-15 NOTE — Progress Notes (Signed)
A/ox3 pleased with MAC, report to Tracy W RN 

## 2013-10-15 NOTE — Op Note (Signed)
[place]  [address]. [place], [id number]   COLONOSCOPY PROCEDURE REPORT  PATIENT: Alexxa, Sabet  MR#: [id number] BIRTHDATE: 1960-03-23 , 89  yrs. old GENDER: Female ENDOSCOPIST: Inda Castle, MD REFERRED XB:[id number] Burnice Logan, M.D. PROCEDURE DATE:  10/15/2013 PROCEDURE:   Colonoscopy, diagnostic First Screening Colonoscopy - Avg.  risk and is 50 yrs.  old or older Yes.  Prior Negative Screening - Now for repeat screening. N/A  History of Adenoma - Now for follow-up colonoscopy & has been > or = to 3 yrs.  N/A  Polyps Removed Today? No.  Recommend repeat exam, <10 yrs? No. ASA CLASS:   Class II INDICATIONS:average risk screening. MEDICATIONS: MAC sedation, administered by CRNA and propofol (Diprivan) 200mg  IV  DESCRIPTION OF PROCEDURE:   After the risks benefits and alternatives of the procedure were thoroughly explained, informed consent was obtained.  A digital rectal exam revealed no abnormalities of the rectum.   The [id number]  endoscope was introduced through the anus and advanced to the cecum, which was identified by both the appendix and ileocecal valve. No adverse events experienced.   The quality of the prep was excellent using Suprep  The instrument was then slowly withdrawn as the colon was fully examined.      COLON FINDINGS: A normal appearing cecum, ileocecal valve, and appendiceal orifice were identified.  The ascending, hepatic flexure, transverse, splenic flexure, descending, sigmoid colon and rectum appeared unremarkable.  No polyps or cancers were seen. Retroflexed views revealed no abnormalities. The time to cecum=4 minutes 55 seconds.  Withdrawal time=6 minutes 40 seconds.  The scope was withdrawn and the procedure completed. COMPLICATIONS: There were no complications.  ENDOSCOPIC IMPRESSION: Normal colon  RECOMMENDATIONS: Continue current colorectal screening recommendations for "routine risk" patients with a  repeat colonoscopy in 10 years.   eSigned:  Inda Castle, MD 10/15/2013 11:30 AM   cc:   PATIENT NAME:  Miyana, Mordecai MR#: [id number]

## 2013-10-16 ENCOUNTER — Telehealth: Payer: Self-pay | Admitting: Gastroenterology

## 2013-10-16 ENCOUNTER — Telehealth: Payer: Self-pay | Admitting: *Deleted

## 2013-10-16 NOTE — Telephone Encounter (Signed)
  Follow up Call-  Call back number 10/15/2013  Post procedure Call Back phone  # [phone]  Permission to leave phone message Yes     Patient questions:  Message left to call us if necessary.

## 2013-10-16 NOTE — Telephone Encounter (Signed)
Spoke with patient while she was shopping in the grocery store.  Patient stated that she was "feeling better, but a little nauseous."  She would pick up soup due to the fact that she hadn't eaten much.  She will call us if necessary.

## 2013-10-23 ENCOUNTER — Ambulatory Visit: Payer: BC Managed Care – PPO | Admitting: Psychiatry

## 2013-11-06 ENCOUNTER — Ambulatory Visit (INDEPENDENT_AMBULATORY_CARE_PROVIDER_SITE_OTHER): Payer: BC Managed Care – PPO | Admitting: Psychiatry

## 2013-11-06 DIAGNOSIS — F331 Major depressive disorder, recurrent, moderate: Secondary | ICD-10-CM

## 2013-11-06 DIAGNOSIS — F988 Other specified behavioral and emotional disorders with onset usually occurring in childhood and adolescence: Secondary | ICD-10-CM

## 2013-11-14 ENCOUNTER — Encounter: Payer: Self-pay | Admitting: Internal Medicine

## 2013-11-14 ENCOUNTER — Ambulatory Visit (INDEPENDENT_AMBULATORY_CARE_PROVIDER_SITE_OTHER): Payer: BC Managed Care – PPO | Admitting: Internal Medicine

## 2013-11-14 VITALS — BP 100/68 | HR 72 | Temp 98.5°F | Resp 18 | Wt 130.0 lb

## 2013-11-14 DIAGNOSIS — F3289 Other specified depressive episodes: Secondary | ICD-10-CM

## 2013-11-14 DIAGNOSIS — F329 Major depressive disorder, single episode, unspecified: Secondary | ICD-10-CM

## 2013-11-14 DIAGNOSIS — M797 Fibromyalgia: Secondary | ICD-10-CM

## 2013-11-14 DIAGNOSIS — J069 Acute upper respiratory infection, unspecified: Secondary | ICD-10-CM

## 2013-11-14 DIAGNOSIS — IMO0001 Reserved for inherently not codable concepts without codable children: Secondary | ICD-10-CM

## 2013-11-14 NOTE — Progress Notes (Signed)
Subjective:    Patient ID: Jordan [name], female    DOB: [birth year], 53 y.o.   MRN: [id number]  HPI  53 year old patient who is seen today in followup.  She has a history of depression and is followed by psychiatry.  She also has a history of fibromyalgia.  Her chief complaint today is increasing low extremity discomfort.  Over the past 3 months.  Pain is intermittent and not associated with walking. For the past several days.  She has also had some increasing cough, nasal congestion, but no fever.  Mild sore throat has resolved.  Medical regimen does include meloxicam as needed.  Past Medical History  Diagnosis Date  . Headache(784.0)   . Depressive disorder, not elsewhere classified   . Anemia, unspecified   . History of bulimia   . History of anorexia nervosa   . Meningitis, unspecified(322.9)   . Other specified glaucoma   . Allergic rhinitis due to pollen   . Acute upper respiratory infections of unspecified site   . Diffuse cystic mastopathy   . Lump or mass in breast   . Perforation of tympanic membrane, unspecified   . Anal fissure   . Anxiety disorder   . Fibromyalgia   . IBS (irritable bowel syndrome)   . Seizures   . Radial styloid tenosynovitis   . 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 Complication of anesthesia     wakes up slow  . Anxiety   . GERD (gastroesophageal reflux disease)     History   Social History  . Marital Status: Divorced    Spouse Name: N/A    Number of Children: N/A  . Years of Education: N/A   Occupational History  . Not on file.   Social History Main Topics  . Smoking status: Never Smoker   . Smokeless tobacco: Never Used  . Alcohol Use: 8.4 oz/week    14 Glasses of wine per week     Comment: 2 glasses of wine/Day  . Drug Use: No  . Sexual Activity: Not on file   Other Topics Concern  . Not on file   Social History Narrative   [employer], [employer] BA, App for MA. [place]. Was the town Insurance account manager for [employer]  - lost her job August '13 and is between work. Married 5 years - divorced, no children                   Past Surgical History  Procedure Laterality Date  . Dilation and curettage of uterus  2006    History of Menometrorrhagia  . Wrist surgery  2001    for de-quervaine - bilateral wrists - tendonitis  . Ankle surgery  1992    right ankle - scar tissue  . Inner ear surgery  1966 to 1986    6 surgeries on right ear  . Uterine ablation  2011  . Laparoscopy  1993    normal    Family History  Problem Relation Age of Onset  . Ovarian cancer Mother   . Heart disease Mother   . Hypertension Mother   . Coronary artery disease Mother   . Colon polyps Mother 23  . Heart failure Father   . Hypertension Father   . Hyperlipidemia Father   . Diabetes Father   . Breast cancer Maternal Grandmother   . Colon cancer Neg Hx   . Esophageal cancer Neg Hx   . Stomach cancer Neg  Hx   . Rectal cancer Neg Hx     No Known Allergies  Current Outpatient Prescriptions on File Prior to Visit  Medication Sig Dispense Refill  . ALPRAZolam (XANAX) 0.5 MG tablet Take 0.5 mg by mouth at bedtime as needed. For sleep.      Marland Kitchen amphetamine-dextroamphetamine (ADDERALL) 30 MG tablet Take 30 mg by mouth 2 (two) times daily.       . ARIPiprazole (ABILIFY) 5 MG tablet Take 10 mg by mouth daily.       . baclofen (LIORESAL) 20 MG tablet Take 0.5 tablets (10 mg total) by mouth 3 (three) times daily.  30 each  6  . DULoxetine (CYMBALTA) 60 MG capsule Take by mouth 2 (two) times daily.       . fluticasone (FLONASE) 50 MCG/ACT nasal spray Place 1 spray into both nostrils daily.  16 g  3  . meloxicam (MOBIC) 15 MG tablet Take 1 tablet (15 mg total) by mouth daily.  90 tablet  1  . traZODone (DESYREL) 50 MG tablet Take 100 mg by mouth at bedtime.       No current facility-administered medications on file prior to visit.    BP 100/68  Pulse 72  Temp(Src) 98.5 F (36.9 C) (Oral)  Resp 18  Wt 130 lb (58.968 kg)   SpO2 98%     Review of Systems  Constitutional: Negative.   HENT: Positive for congestion, rhinorrhea and sinus pressure. Negative for dental problem, hearing loss, sore throat and tinnitus.   Eyes: Negative for pain, discharge and visual disturbance.  Respiratory: Positive for cough. Negative for shortness of breath.   Cardiovascular: Negative for chest pain, palpitations and leg swelling.  Gastrointestinal: Negative for nausea, vomiting, abdominal pain, diarrhea, constipation, blood in stool and abdominal distention.  Genitourinary: Negative for dysuria, urgency, frequency, hematuria, flank pain, vaginal bleeding, vaginal discharge, difficulty urinating, vaginal pain and pelvic pain.  Musculoskeletal: Positive for arthralgias and back pain. Negative for gait problem and joint swelling.  Skin: Negative for rash.  Neurological: Negative for dizziness, syncope, speech difficulty, weakness, numbness and headaches.  Hematological: Negative for adenopathy.  Psychiatric/Behavioral: Negative for behavioral problems, dysphoric mood and agitation. The patient is not nervous/anxious.        Objective:   Physical Exam  Constitutional: She is oriented to person, place, and time. She appears well-developed and well-nourished. No distress.  Afebrile No distress Blood pressure low normal  HENT:  Head: Normocephalic.  Right Ear: External ear normal.  Left Ear: External ear normal.  Mouth/Throat: Oropharynx is clear and moist.  Eyes: Conjunctivae and EOM are normal. Pupils are equal, round, and reactive to light.  Neck: Normal range of motion. Neck supple. No thyromegaly present.  Cardiovascular: Normal rate, regular rhythm, normal heart sounds and intact distal pulses.   Pedal pulses full  Pulmonary/Chest: Effort normal and breath sounds normal.  Abdominal: Soft. Bowel sounds are normal. She exhibits no mass. There is no tenderness.  Musculoskeletal: Normal range of motion.    Lymphadenopathy:    She has no cervical adenopathy.  Neurological: She is alert and oriented to person, place, and time.  Skin: Skin is warm and dry. No rash noted.  Oval-shaped scar, right lateral lower leg near the ankle  Psychiatric: She has a normal mood and affect. Her behavior is normal.          Assessment & Plan:   Fibromyalgia/leg pain.  Continue present regimen Viral URI History depression.  Followup psychiatry  Return here in 6 months or as needed

## 2013-11-14 NOTE — Progress Notes (Signed)
Pre visit review using our clinic review tool, if applicable. No additional management support is needed unless otherwise documented below in the visit note. 

## 2013-11-14 NOTE — Patient Instructions (Signed)
Acute sinusitis symptoms for less than 10 days are generally not helped by antibiotic therapy.  Use saline irrigation, warm  moist compresses and over-the-counter decongestants only as directed.  Call if there is no improvement in 5 to 7 days, or sooner if you develop increasing pain, fever, or any new symptoms.    It is important that you exercise regularly, at least 20 minutes 3 to 4 times per week.  If you develop chest pain or shortness of breath seek  medical attention.

## 2013-11-18 ENCOUNTER — Ambulatory Visit (INDEPENDENT_AMBULATORY_CARE_PROVIDER_SITE_OTHER): Payer: BC Managed Care – PPO | Admitting: Psychiatry

## 2013-11-18 DIAGNOSIS — F331 Major depressive disorder, recurrent, moderate: Secondary | ICD-10-CM

## 2013-11-18 DIAGNOSIS — F988 Other specified behavioral and emotional disorders with onset usually occurring in childhood and adolescence: Secondary | ICD-10-CM

## 2013-11-25 ENCOUNTER — Telehealth: Payer: Self-pay | Admitting: Internal Medicine

## 2013-11-25 MED ORDER — CYCLOBENZAPRINE HCL 5 MG PO TABS: 5.0000 mg | ORAL_TABLET | Freq: Three times a day (TID) | ORAL | Status: DC | PRN

## 2013-11-25 NOTE — Telephone Encounter (Signed)
Generic Flexeril 5 #30, one 3 times a day as needed

## 2013-11-25 NOTE — Telephone Encounter (Signed)
Pt said she was  in last week and now her leg pain is worst and has moved to her back and would like to know if she need to come back in or if want to give her something for the pain.Marland Kitchen

## 2013-11-25 NOTE — Telephone Encounter (Signed)
Pt notified Rx for Flexeril 5 mg 3 times a day as needed for spasms was sent to pharmacy.

## 2013-11-25 NOTE — Telephone Encounter (Signed)
Please advise 

## 2013-12-04 ENCOUNTER — Ambulatory Visit: Payer: BC Managed Care – PPO | Admitting: Psychiatry

## 2013-12-05 ENCOUNTER — Other Ambulatory Visit: Payer: Self-pay

## 2013-12-18 ENCOUNTER — Ambulatory Visit: Payer: BC Managed Care – PPO | Admitting: Psychiatry

## 2013-12-19 ENCOUNTER — Ambulatory Visit (HOSPITAL_COMMUNITY): Payer: BC Managed Care – PPO | Attending: Internal Medicine

## 2013-12-23 ENCOUNTER — Telehealth: Payer: Self-pay | Admitting: Internal Medicine

## 2013-12-23 ENCOUNTER — Ambulatory Visit (INDEPENDENT_AMBULATORY_CARE_PROVIDER_SITE_OTHER): Payer: BC Managed Care – PPO | Admitting: Psychiatry

## 2013-12-23 DIAGNOSIS — F332 Major depressive disorder, recurrent severe without psychotic features: Secondary | ICD-10-CM

## 2013-12-23 DIAGNOSIS — F409 Phobic anxiety disorder, unspecified: Secondary | ICD-10-CM

## 2013-12-23 MED ORDER — CYCLOBENZAPRINE HCL 5 MG PO TABS: 5.0000 mg | ORAL_TABLET | Freq: Three times a day (TID) | ORAL | Status: DC | PRN

## 2013-12-23 NOTE — Telephone Encounter (Signed)
[id number] - [place], [place] [address] is requesting re-fill on cyclobenzaprine (FLEXERIL) 5 MG tablet

## 2013-12-23 NOTE — Telephone Encounter (Signed)
Rx sent 

## 2014-01-06 ENCOUNTER — Ambulatory Visit (INDEPENDENT_AMBULATORY_CARE_PROVIDER_SITE_OTHER): Payer: BC Managed Care – PPO | Admitting: Psychiatry

## 2014-01-06 DIAGNOSIS — F909 Attention-deficit hyperactivity disorder, unspecified type: Secondary | ICD-10-CM

## 2014-01-06 DIAGNOSIS — F332 Major depressive disorder, recurrent severe without psychotic features: Secondary | ICD-10-CM

## 2014-01-09 ENCOUNTER — Ambulatory Visit (HOSPITAL_COMMUNITY)
Admission: RE | Admit: 2014-01-09 | Discharge: 2014-01-09 | Disposition: A | Discharge status: Home / Self Care | Payer: BC Managed Care – PPO | Source: Ambulatory Visit | Attending: Internal Medicine | Admitting: Internal Medicine

## 2014-01-09 DIAGNOSIS — Z1231 Encounter for screening mammogram for malignant neoplasm of breast: Secondary | ICD-10-CM | POA: Diagnosis not present

## 2014-01-12 ENCOUNTER — Institutional Professional Consult (permissible substitution): Payer: BC Managed Care – PPO | Admitting: Neurology

## 2014-01-20 ENCOUNTER — Ambulatory Visit: Payer: BC Managed Care – PPO | Admitting: Psychiatry

## 2014-01-21 ENCOUNTER — Ambulatory Visit: Payer: BC Managed Care – PPO | Admitting: Neurology

## 2014-01-21 ENCOUNTER — Telehealth: Payer: Self-pay | Admitting: Neurology

## 2014-01-21 DIAGNOSIS — Z0289 Encounter for other administrative examinations: Secondary | ICD-10-CM

## 2014-01-21 NOTE — Telephone Encounter (Signed)
This patient did not show for a new patient appointment today. 

## 2014-01-28 ENCOUNTER — Ambulatory Visit (INDEPENDENT_AMBULATORY_CARE_PROVIDER_SITE_OTHER): Payer: BC Managed Care – PPO | Admitting: Psychiatry

## 2014-01-28 DIAGNOSIS — F332 Major depressive disorder, recurrent severe without psychotic features: Secondary | ICD-10-CM

## 2014-01-28 DIAGNOSIS — F909 Attention-deficit hyperactivity disorder, unspecified type: Secondary | ICD-10-CM

## 2014-01-30 ENCOUNTER — Encounter: Payer: Self-pay | Admitting: Neurology

## 2014-01-30 ENCOUNTER — Ambulatory Visit (INDEPENDENT_AMBULATORY_CARE_PROVIDER_SITE_OTHER): Payer: BC Managed Care – PPO | Admitting: Neurology

## 2014-01-30 VITALS — BP 106/73 | HR 83 | Ht 63.0 in | Wt 136.2 lb

## 2014-01-30 DIAGNOSIS — M797 Fibromyalgia: Secondary | ICD-10-CM

## 2014-01-30 MED ORDER — AMITRIPTYLINE HCL 25 MG PO TABS: ORAL_TABLET | ORAL | Status: DC

## 2014-01-30 NOTE — Progress Notes (Signed)
Reason for visit: Fibromyalgia  Jordan [name] is a 53 y.o. female  History of present illness:  Jordan [name] is a 53 year old right-handed white female with a history of numbness that began on the right shoulder and right arm and right leg 6 years ago. The patient was seen initially by Dr. Erling Cruz, and a workup of this issue was undertaken. MRI evaluation of the brain showed minimal nonspecific periventricular white matter changes which have been stable over time. The patient last had MRI evaluation of the brain and cervical spine in February 2015, and the MRI lesions have not changed. The patient underwent a lumbar puncture, and there were no abnormalities found, oligoclonal banding was negative. The patient underwent EMG and nerve conduction studies involving the right upper extremity that were unremarkable. The patient was eventually sent to Dr. Rana Snare, and further evaluation did not confirm the presence of multiple sclerosis. The patient was sent to [facility], and she was seen by an MS specialist there. MS was not confirmed. The patient was felt to have fibromyalgia. She has had trigger points and pain in the neck, shoulders, low back, and thighs. This issue continues. The patient has continued to have numbness in the right arm, right face, right leg, with some intermittent numbness in the left leg below the knee. She may have some tingling sensations in her hands in the morning. She suffers from significant depression and she is followed through [facility] for this. 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 She denies any significant issues controlling the bowels or the bladder with exception that occasionally she may have some urinary incontinence. The patient does report some mild gait instability, and occasional falls. She has some blurring of vision, no loss of vision. She has some spasms in the legs, she  takes Flexeril at night. She has been on Lyrica in the past, but she indicated that this increased her pain. She has also been on Savella for her fibromyalgia. Currently, she is on Cymbalta, baclofen, Flexeril, and trazodone. She takes Abilify for depression. She continues to have problems with sleeping. She is sent to this office for further evaluation.   Please refer to the excellent note by Dr. Tomi Likens from 10/08/2012 concerning her medical history and workup for possible demyelinating disease.  Past Medical History  Diagnosis Date  . Headache(784.0)   . Depressive disorder, not elsewhere classified   . Anemia, unspecified   . History of bulimia   . History of anorexia nervosa   . Meningitis, unspecified(322.9)   . Other specified glaucoma   . Allergic rhinitis due to pollen   . Acute upper respiratory infections of unspecified site   . Diffuse cystic mastopathy   . Lump or mass in breast   . Perforation of tympanic membrane, unspecified   . Anal fissure   . Anxiety disorder   . Fibromyalgia   . IBS (irritable bowel syndrome)   . Seizures   . Radial styloid tenosynovitis   . 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 Complication of anesthesia     wakes up slow  . Anxiety   . GERD (gastroesophageal reflux disease)   . ADD (attention deficit disorder)     Past Surgical History  Procedure Laterality Date  . Dilation and curettage of uterus  2006    History of Menometrorrhagia  . Wrist surgery  2001    for de-quervaine -  bilateral wrists - tendonitis  . Ankle surgery  1992    right ankle - scar tissue  . Inner ear surgery  1966 to 1986    6 surgeries on right ear  . Uterine ablation  2011  . Laparoscopy  1993    normal    Family History  Problem Relation Age of Onset  . Ovarian cancer Mother   . Heart disease Mother   . Hypertension Mother   . Coronary artery disease Mother   . Colon polyps Mother 60  . Heart failure Father   . Hypertension Father   . Hyperlipidemia Father     . Diabetes Father   . Breast cancer Maternal Grandmother   . Colon cancer Neg Hx   . Esophageal cancer Neg Hx   . Stomach cancer Neg Hx   . Rectal cancer Neg Hx     Social history:  reports that she has never smoked. She has never used smokeless tobacco. She reports that she drinks about 8.4 oz of alcohol per week. She reports that she does not use illicit drugs.  Medications:  Current Outpatient Prescriptions on File Prior to Visit  Medication Sig Dispense Refill  . ALPRAZolam (XANAX) 0.5 MG tablet Take 0.5 mg by mouth at bedtime as needed. For sleep.  Also 1/2 tab during the day.    . amphetamine-dextroamphetamine (ADDERALL) 30 MG tablet Take 30 mg by mouth 3 (three) times daily.     . ARIPiprazole (ABILIFY) 5 MG tablet Take 5 mg by mouth daily. At bedtime    . baclofen (LIORESAL) 20 MG tablet Take 0.5 tablets (10 mg total) by mouth 3 (three) times daily. 30 each 6  . DULoxetine (CYMBALTA) 60 MG capsule Take by mouth 2 (two) times daily.     . fluticasone (FLONASE) 50 MCG/ACT nasal spray Place 1 spray into both nostrils daily. 16 g 3  . meloxicam (MOBIC) 15 MG tablet Take 1 tablet (15 mg total) by mouth daily. 90 tablet 1   No current facility-administered medications on file prior to visit.     No Known Allergies  ROS:  Out of a complete 14 system review of symptoms, the patient complains only of the following symptoms, and all other reviewed systems are negative.  Weight gain, fatigue  Difficulty swallowing  Blurred vision  Cough  Incontinence of bowel  Easy bruising  Feeling hot, increased thirst  Cramps, aching muscles  Memory loss, confusion, numbness, weakness, slurred speech, difficulty swallowing, dizziness  Depression, anxiety, decreased energy, change in appetite, disinterest in activities, racing thoughts  Insomnia    Blood pressure 106/73, pulse 83, height 5\' 3"  (1.6 m), weight 136 lb 3.2 oz (61.78 kg).  Physical Exam  General: The patient is alert and  cooperative at the time of the examination.  Eyes: Pupils are equal, round, and reactive to light. Discs are flat bilaterally.  Neck: The neck is supple, no carotid bruits are noted.  Respiratory: The respiratory examination is clear.  Cardiovascular: The cardiovascular examination reveals a regular rate and rhythm, no obvious murmurs or rubs are noted.  Neuromuscular: The patient has excellent range of motion movement of the cervical and lumbosacral spine.  Skin: Extremities are without significant edema.  Neurologic Exam  Mental status: The patient is alert and oriented x 3 at the time of the examination. The patient has apparent normal recent and remote memory, with an apparently normal attention span and concentration ability.  Cranial nerves: Facial symmetry is present. There is  good sensation of the face to pinprick and soft touch on the left, decreased on the right. The patient splits the midline with vibration sensation on the forehead, decreased on the right. The strength of the facial muscles and the muscles to head turning and shoulder shrug are normal bilaterally. Speech is well enunciated, no aphasia or dysarthria is noted. Extraocular movements are full. Visual fields are full. The tongue is midline, and the patient has symmetric elevation of the soft palate. No obvious hearing deficits are noted.  Motor: The motor testing reveals 5 over 5 strength of all 4 extremities. Some giveaway weakness was seen involving the right arm and right leg, no true weakness was seen.  Good symmetric motor tone is noted throughout.  Sensory: Sensory testing is intact to pinprick, soft touch, vibration sensation, and position sense on all 4 extremities, with exception that there is some decrease in pinprick sensation and vibration sensation on the right arm and right leg. No evidence of extinction is noted.  Coordination: Cerebellar testing reveals good finger-nose-finger and heel-to-shin  bilaterally.  Gait and station: Gait is normal. Tandem gait is normal. Romberg is negative. No drift is seen.  Reflexes: Deep tendon reflexes are symmetric and normal bilaterally, with exception that the ankle jerk reflexes are depressed bilaterally. Toes are downgoing bilaterally.   MRI brain 04/14/13:  IMPRESSION: Minor white matter signal abnormality involving the optic radiations in the periatrial region, right greater than left. In the appropriate clinical setting, these are consistent with stable mild chronic multiple sclerosis. No restricted diffusion or abnormal postcontrast enhancement, nor are there new lesions, to suggest acute disease progression.   MRI cervical 04/14/13:  IMPRESSION: Stable mild left foraminal stenosis at C3-4.  Stable small central disc protrusion at C4-5.  Slightly progressive mild spinal and bilateral foraminal stenosis at C5-6, right greater than left.    Assessment/Plan:  1. Fibromyalgia  2. Right hemisensory deficit  3. Nonorganic neurologic examination  The clinical examination done today shows evidence of a right hemisensory deficit, giveaway weakness on the right, the patient splits the midline of the forehead with vibration sensation. The patient likely has some degree of psychogenic symptoms. The patient very well may have fibromyalgia. I do not believe that the patient has multiple sclerosis. MRI brain evaluations have been very stable over many years, and a thorough workup for multiple sclerosis has been negative previously. The patient is applying for Social Security disability at this time. I will discontinue the trazodone, and substitute this for amitriptyline to help her sleep, and to help fibromyalgia symptoms. The patient in the future may be placed on gabapentin. The patient may not require Flexeril at night if the amitriptyline is effective. She will follow-up in 6 months for an evaluation.    Jill Alexanders MD 01/30/2014  6:28 PM  [facility] [address] [place], [place] [address]  Phone [phone] Fax [phone]

## 2014-01-30 NOTE — Patient Instructions (Signed)
Fibromyalgia Fibromyalgia is a disorder that is often misunderstood. It is associated with muscular pains and tenderness that comes and goes. It is often associated with fatigue and sleep disturbances. Though it tends to be long-lasting, fibromyalgia is not life-threatening. CAUSES  The exact cause of fibromyalgia is unknown. People with certain gene types are predisposed to developing fibromyalgia and other conditions. Certain factors can play a role as triggers, such as:  Spine disorders.  Arthritis.  Severe injury (trauma) and other physical stressors.  Emotional stressors. SYMPTOMS   The main symptom is pain and stiffness in the muscles and joints, which can vary over time.  Sleep and fatigue problems. Other related symptoms may include:  Bowel and bladder problems.  Headaches.  Visual problems.  Problems with odors and noises.  Depression or mood changes.  Painful periods (dysmenorrhea).  Dryness of the skin or eyes. DIAGNOSIS  There are no specific tests for diagnosing fibromyalgia. Patients can be diagnosed accurately from the specific symptoms they have. The diagnosis is made by determining that nothing else is causing the problems. TREATMENT  There is no cure. Management includes medicines and an active, healthy lifestyle. The goal is to enhance physical fitness, decrease pain, and improve sleep. HOME CARE INSTRUCTIONS   Only take over-the-counter or prescription medicines as directed by your caregiver. Sleeping pills, tranquilizers, and pain medicines may make your problems worse.  Low-impact aerobic exercise is very important and advised for treatment. At first, it may seem to make pain worse. Gradually increasing your tolerance will overcome this feeling.  Learning relaxation techniques and how to control stress will help you. Biofeedback, visual imagery, hypnosis, muscle relaxation, yoga, and meditation are all options.  Anti-inflammatory medicines and  physical therapy may provide short-term help.  Acupuncture or massage treatments may help.  Take muscle relaxant medicines as suggested by your caregiver.  Avoid stressful situations.  Plan a healthy lifestyle. This includes your diet, sleep, rest, exercise, and friends.  Find and practice a hobby you enjoy.  Join a fibromyalgia support group for interaction, ideas, and sharing advice. This may be helpful. SEEK MEDICAL CARE IF:  You are not having good results or improvement from your treatment. FOR MORE INFORMATION  National Fibromyalgia Association: www.fmaware.org Arthritis Foundation: www.arthritis.org Document Released: 02/06/2005 Document Revised: 05/01/2011 Document Reviewed: 05/19/2009 ExitCare Patient Information 2015 [employer]. This information is not intended to replace advice given to you by your health care provider. Make sure you discuss any questions you have with your health care provider.  

## 2014-02-17 ENCOUNTER — Ambulatory Visit: Payer: BC Managed Care – PPO | Admitting: Psychiatry

## 2014-02-21 ENCOUNTER — Other Ambulatory Visit: Payer: Self-pay | Admitting: Internal Medicine

## 2014-02-24 ENCOUNTER — Ambulatory Visit (INDEPENDENT_AMBULATORY_CARE_PROVIDER_SITE_OTHER): Payer: BLUE CROSS/BLUE SHIELD | Admitting: Psychiatry

## 2014-02-24 DIAGNOSIS — F909 Attention-deficit hyperactivity disorder, unspecified type: Secondary | ICD-10-CM

## 2014-02-24 DIAGNOSIS — F332 Major depressive disorder, recurrent severe without psychotic features: Secondary | ICD-10-CM

## 2014-03-05 ENCOUNTER — Ambulatory Visit (INDEPENDENT_AMBULATORY_CARE_PROVIDER_SITE_OTHER): Payer: BLUE CROSS/BLUE SHIELD | Admitting: Psychiatry

## 2014-03-05 DIAGNOSIS — F332 Major depressive disorder, recurrent severe without psychotic features: Secondary | ICD-10-CM

## 2014-03-05 DIAGNOSIS — F909 Attention-deficit hyperactivity disorder, unspecified type: Secondary | ICD-10-CM

## 2014-03-17 ENCOUNTER — Ambulatory Visit (INDEPENDENT_AMBULATORY_CARE_PROVIDER_SITE_OTHER): Payer: BLUE CROSS/BLUE SHIELD | Admitting: Psychiatry

## 2014-03-17 DIAGNOSIS — F332 Major depressive disorder, recurrent severe without psychotic features: Secondary | ICD-10-CM

## 2014-03-17 DIAGNOSIS — F909 Attention-deficit hyperactivity disorder, unspecified type: Secondary | ICD-10-CM

## 2014-03-19 ENCOUNTER — Other Ambulatory Visit: Payer: Self-pay | Admitting: Family Medicine

## 2014-03-19 DIAGNOSIS — K219 Gastro-esophageal reflux disease without esophagitis: Secondary | ICD-10-CM

## 2014-03-27 ENCOUNTER — Ambulatory Visit: Payer: Self-pay | Admitting: Psychology

## 2014-03-27 ENCOUNTER — Ambulatory Visit
Admission: RE | Admit: 2014-03-27 | Discharge: 2014-03-27 | Disposition: A | Discharge status: Home / Self Care | Payer: BLUE CROSS/BLUE SHIELD | Source: Ambulatory Visit | Attending: Family Medicine | Admitting: Family Medicine

## 2014-03-27 DIAGNOSIS — K219 Gastro-esophageal reflux disease without esophagitis: Secondary | ICD-10-CM

## 2014-04-02 ENCOUNTER — Ambulatory Visit (INDEPENDENT_AMBULATORY_CARE_PROVIDER_SITE_OTHER): Payer: BLUE CROSS/BLUE SHIELD | Admitting: Psychiatry

## 2014-04-02 DIAGNOSIS — F900 Attention-deficit hyperactivity disorder, predominantly inattentive type: Secondary | ICD-10-CM

## 2014-04-02 DIAGNOSIS — F332 Major depressive disorder, recurrent severe without psychotic features: Secondary | ICD-10-CM

## 2014-04-16 ENCOUNTER — Ambulatory Visit (INDEPENDENT_AMBULATORY_CARE_PROVIDER_SITE_OTHER): Payer: BLUE CROSS/BLUE SHIELD | Admitting: Psychiatry

## 2014-04-16 DIAGNOSIS — F909 Attention-deficit hyperactivity disorder, unspecified type: Secondary | ICD-10-CM

## 2014-04-16 DIAGNOSIS — F332 Major depressive disorder, recurrent severe without psychotic features: Secondary | ICD-10-CM

## 2014-04-30 ENCOUNTER — Ambulatory Visit: Payer: BLUE CROSS/BLUE SHIELD | Admitting: Psychiatry

## 2014-05-04 ENCOUNTER — Telehealth: Payer: Self-pay | Admitting: Neurology

## 2014-05-04 MED ORDER — GABAPENTIN 100 MG PO CAPS: ORAL_CAPSULE | ORAL | Status: DC

## 2014-05-04 NOTE — Telephone Encounter (Signed)
Patient is calling in regard to Rx Amitriptyline 25 mg. Drug is causing headaches and aching of legs so she discontinuted. Please call.

## 2014-05-04 NOTE — Telephone Encounter (Signed)
I called patient. The patient indicates that she has stopped the amitriptyline. I will start low-dose gabapentin, though up gradually. The patient will call if she has problems.

## 2014-05-06 ENCOUNTER — Ambulatory Visit (INDEPENDENT_AMBULATORY_CARE_PROVIDER_SITE_OTHER): Payer: BLUE CROSS/BLUE SHIELD | Admitting: Psychiatry

## 2014-05-06 DIAGNOSIS — F909 Attention-deficit hyperactivity disorder, unspecified type: Secondary | ICD-10-CM | POA: Diagnosis not present

## 2014-05-06 DIAGNOSIS — F332 Major depressive disorder, recurrent severe without psychotic features: Secondary | ICD-10-CM

## 2014-05-19 ENCOUNTER — Ambulatory Visit (INDEPENDENT_AMBULATORY_CARE_PROVIDER_SITE_OTHER): Payer: BLUE CROSS/BLUE SHIELD | Admitting: Psychiatry

## 2014-05-19 DIAGNOSIS — F332 Major depressive disorder, recurrent severe without psychotic features: Secondary | ICD-10-CM

## 2014-05-19 DIAGNOSIS — F909 Attention-deficit hyperactivity disorder, unspecified type: Secondary | ICD-10-CM | POA: Diagnosis not present

## 2014-06-02 ENCOUNTER — Ambulatory Visit (INDEPENDENT_AMBULATORY_CARE_PROVIDER_SITE_OTHER): Payer: BLUE CROSS/BLUE SHIELD | Admitting: Psychiatry

## 2014-06-02 DIAGNOSIS — F332 Major depressive disorder, recurrent severe without psychotic features: Secondary | ICD-10-CM

## 2014-06-23 ENCOUNTER — Ambulatory Visit (INDEPENDENT_AMBULATORY_CARE_PROVIDER_SITE_OTHER): Payer: BLUE CROSS/BLUE SHIELD | Admitting: Psychiatry

## 2014-06-23 DIAGNOSIS — F909 Attention-deficit hyperactivity disorder, unspecified type: Secondary | ICD-10-CM

## 2014-06-23 DIAGNOSIS — F332 Major depressive disorder, recurrent severe without psychotic features: Secondary | ICD-10-CM

## 2014-07-08 ENCOUNTER — Ambulatory Visit (INDEPENDENT_AMBULATORY_CARE_PROVIDER_SITE_OTHER): Payer: BLUE CROSS/BLUE SHIELD | Admitting: Psychiatry

## 2014-07-08 DIAGNOSIS — F332 Major depressive disorder, recurrent severe without psychotic features: Secondary | ICD-10-CM

## 2014-07-08 DIAGNOSIS — F909 Attention-deficit hyperactivity disorder, unspecified type: Secondary | ICD-10-CM

## 2014-07-21 ENCOUNTER — Ambulatory Visit (INDEPENDENT_AMBULATORY_CARE_PROVIDER_SITE_OTHER): Payer: BLUE CROSS/BLUE SHIELD | Admitting: Psychiatry

## 2014-07-21 DIAGNOSIS — F909 Attention-deficit hyperactivity disorder, unspecified type: Secondary | ICD-10-CM

## 2014-07-21 DIAGNOSIS — F332 Major depressive disorder, recurrent severe without psychotic features: Secondary | ICD-10-CM

## 2014-08-04 ENCOUNTER — Ambulatory Visit (INDEPENDENT_AMBULATORY_CARE_PROVIDER_SITE_OTHER): Payer: BLUE CROSS/BLUE SHIELD | Admitting: Neurology

## 2014-08-04 ENCOUNTER — Telehealth: Payer: Self-pay | Admitting: Neurology

## 2014-08-04 ENCOUNTER — Encounter: Payer: Self-pay | Admitting: Neurology

## 2014-08-04 VITALS — BP 121/79 | HR 78 | Ht 63.0 in | Wt 136.8 lb

## 2014-08-04 DIAGNOSIS — G441 Vascular headache, not elsewhere classified: Secondary | ICD-10-CM

## 2014-08-04 DIAGNOSIS — M545 Low back pain, unspecified: Secondary | ICD-10-CM

## 2014-08-04 DIAGNOSIS — M5489 Other dorsalgia: Secondary | ICD-10-CM

## 2014-08-04 DIAGNOSIS — M797 Fibromyalgia: Secondary | ICD-10-CM | POA: Diagnosis not present

## 2014-08-04 MED ORDER — GABAPENTIN 300 MG PO CAPS: ORAL_CAPSULE | ORAL | Status: DC

## 2014-08-04 NOTE — Telephone Encounter (Signed)
I called the patient and left a voicemail.

## 2014-08-04 NOTE — Telephone Encounter (Signed)
I called the patient. She is very upset about the way her appointment went earlier. She does not feel like she got the medication she needs for her pain. She requested to speak to Dr. Jannifer Franklin about this.

## 2014-08-04 NOTE — Patient Instructions (Signed)
Fibromyalgia Fibromyalgia is a disorder that is often misunderstood. It is associated with muscular pains and tenderness that comes and goes. It is often associated with fatigue and sleep disturbances. Though it tends to be long-lasting, fibromyalgia is not life-threatening. CAUSES  The exact cause of fibromyalgia is unknown. People with certain gene types are predisposed to developing fibromyalgia and other conditions. Certain factors can play a role as triggers, such as:  Spine disorders.  Arthritis.  Severe injury (trauma) and other physical stressors.  Emotional stressors. SYMPTOMS   The main symptom is pain and stiffness in the muscles and joints, which can vary over time.  Sleep and fatigue problems. Other related symptoms may include:  Bowel and bladder problems.  Headaches.  Visual problems.  Problems with odors and noises.  Depression or mood changes.  Painful periods (dysmenorrhea).  Dryness of the skin or eyes. DIAGNOSIS  There are no specific tests for diagnosing fibromyalgia. Patients can be diagnosed accurately from the specific symptoms they have. The diagnosis is made by determining that nothing else is causing the problems. TREATMENT  There is no cure. Management includes medicines and an active, healthy lifestyle. The goal is to enhance physical fitness, decrease pain, and improve sleep. HOME CARE INSTRUCTIONS   Only take over-the-counter or prescription medicines as directed by your caregiver. Sleeping pills, tranquilizers, and pain medicines may make your problems worse.  Low-impact aerobic exercise is very important and advised for treatment. At first, it may seem to make pain worse. Gradually increasing your tolerance will overcome this feeling.  Learning relaxation techniques and how to control stress will help you. Biofeedback, visual imagery, hypnosis, muscle relaxation, yoga, and meditation are all options.  Anti-inflammatory medicines and  physical therapy may provide short-term help.  Acupuncture or massage treatments may help.  Take muscle relaxant medicines as suggested by your caregiver.  Avoid stressful situations.  Plan a healthy lifestyle. This includes your diet, sleep, rest, exercise, and friends.  Find and practice a hobby you enjoy.  Join a fibromyalgia support group for interaction, ideas, and sharing advice. This may be helpful. SEEK MEDICAL CARE IF:  You are not having good results or improvement from your treatment. FOR MORE INFORMATION  National Fibromyalgia Association: www.fmaware.org Arthritis Foundation: www.arthritis.org Document Released: 02/06/2005 Document Revised: 05/01/2011 Document Reviewed: 05/19/2009 ExitCare Patient Information 2015 [employer]. This information is not intended to replace advice given to you by your health care provider. Make sure you discuss any questions you have with your health care provider.  

## 2014-08-04 NOTE — Telephone Encounter (Signed)
Patient is calling about her appointment today.  She was crying and stated she wanted to talk with Dr. Jannifer Franklin.  Thanks!

## 2014-08-04 NOTE — Telephone Encounter (Signed)
I called the patient. I went over the plan for treatment once again. The patient wants to do multiple things at one time such as switch to [facility], get trigger point injections, increased baclofen, and increase the gabapentin. I have indicated that I would prefer to increase one medication at a time so that if side effects occur, it is apparent what is doing it. The patient is unable to go on [place] while she is also on Cymbalta. The trigger point injections represent only a transient in a fit with the pain, but these can be done in the future. I have recommended that she give some time on the increased dose of gabapentin to see if this is effective before the trigger point injections are done. The patient can go up on baclofen in the future if needed.

## 2014-08-04 NOTE — Progress Notes (Signed)
Reason for visit: Fibromyalgia  Jordan [name] is an 54 y.o. female  History of present illness:  Jordan [name] is a 54 year old right-handed white female with a history of fibromyalgia. The patient returns with ongoing symptoms of discomfort in the arms and legs, and back. The patient continues to have reports of weakness on the right side the body, and some numbness on the right. The last evaluation suggested nonorganic features to the clinical examination. Apparently, the patient has retired as a Civil engineer, contracting, and she has gotten on Fish farm manager disability. 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 The patient reports ongoing problems with severe fatigue, and some occasional problems with balance and falls. The patient has had some weight gain on medication. She returns for further evaluation.    Past Medical History  Diagnosis Date  . Headache(784.0)   . Depressive disorder, not elsewhere classified   . Anemia, unspecified   . History of bulimia   . History of anorexia nervosa   . Meningitis, unspecified(322.9)   . Other specified glaucoma   . Allergic rhinitis due to pollen   . Acute upper respiratory infections of unspecified site   . Diffuse cystic mastopathy   . Lump or mass in breast   . Perforation of tympanic membrane, unspecified   . Anal fissure   . Anxiety disorder   . Fibromyalgia   . IBS (irritable bowel syndrome)   . Seizures   . Radial styloid tenosynovitis   . 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 Complication of anesthesia     wakes up slow  . Anxiety   . GERD (gastroesophageal reflux disease)   . ADD (attention deficit disorder)   . Falls     Past Surgical History  Procedure Laterality Date  . Dilation and curettage of uterus  2006    History of Menometrorrhagia  . Wrist surgery  2001    for de-quervaine - bilateral wrists - tendonitis  . Ankle surgery  1992    right ankle - scar tissue  . Inner ear surgery  1966  to 1986    6 surgeries on right ear  . Uterine ablation  2011  . Laparoscopy  1993    normal    Family History  Problem Relation Age of Onset  . Ovarian cancer Mother   . Heart disease Mother   . Hypertension Mother   . Coronary artery disease Mother   . Colon polyps Mother 37  . Heart failure Father   . Hypertension Father   . Hyperlipidemia Father   . Diabetes Father   . Breast cancer Maternal Grandmother   . Colon cancer Neg Hx   . Esophageal cancer Neg Hx   . Stomach cancer Neg Hx   . Rectal cancer Neg Hx     Social history:  reports that she has never smoked. She has never used smokeless tobacco. She reports that she drinks about 8.4 oz of alcohol per week. She reports that she does not use illicit drugs.   No Known Allergies  Medications:  Prior to Admission medications   Medication Sig Start Date End Date Taking? Authorizing Provider  albuterol (PROAIR HFA) 108 (90 BASE) MCG/ACT inhaler Inhale 2 puffs into the lungs. 01/30/14 01/30/15 Yes Historical Provider, MD  ALPRAZolam Duanne Moron) 0.5 MG tablet Take 0.5 mg by mouth at bedtime as needed. For sleep.  Also 1/2 tab during the day.   Yes Historical Provider,  MD  amphetamine-dextroamphetamine (ADDERALL XR) 30 MG 24 hr capsule Take 30 mg by mouth daily.   Yes Historical Provider, MD  amphetamine-dextroamphetamine (ADDERALL) 30 MG tablet Take 30 mg by mouth 3 (three) times daily.    Yes Historical Provider, MD  ARIPiprazole (ABILIFY) 5 MG tablet Take 5 mg by mouth daily. At bedtime   Yes Historical Provider, MD  baclofen (LIORESAL) 20 MG tablet Take 0.5 tablets (10 mg total) by mouth 3 (three) times daily. 08/15/13  Yes Marletta Lor, MD  cyclobenzaprine (FLEXERIL) 10 MG tablet Take 10 mg by mouth at bedtime. 01/06/14  Yes Historical Provider, MD  DULoxetine (CYMBALTA) 60 MG capsule Take by mouth 2 (two) times daily.    Yes Historical Provider, MD  fluticasone (FLONASE) 50 MCG/ACT nasal spray Place 1 spray into both  nostrils daily. 08/15/13  Yes Marletta Lor, MD  gabapentin (NEURONTIN) 100 MG capsule One capsule 3 times daily for one week, then take 2 capsules 3 times daily 05/04/14  Yes Kathrynn Ducking, MD  meloxicam (MOBIC) 15 MG tablet TAKE 1 TABLET BY MOUTH ONCE DAILY 02/24/14  Yes Marletta Lor, MD  omeprazole (PRILOSEC OTC) 20 MG tablet Take 20 mg by mouth daily. 01/06/14 01/06/15 Yes Historical Provider, MD  pantoprazole (PROTONIX) 40 MG tablet Take 40 mg by mouth daily.   Yes Historical Provider, MD    ROS:  Out of a complete 14 system review of symptoms, the patient complains only of the following symptoms, and all other reviewed systems are negative.  Increased appetite, weight gain, fatigue, excessive sweating  Runny nose Heat intolerance, excessive thirst, assess of eating Swollen abdomen, vomiting Environmental allergies Incontinence of bladder, frequency of urination, urinary urgency Back pain, achy muscles, muscle cramps, walking difficulty Bruising easily Memory loss, dizziness, numbness, speech difficulty, weakness Confusion, decreased concentration, depression, anxiety, self injury    Blood pressure 121/79, pulse 78, height 5\' 3"  (1.6 m), weight 136 lb 12.8 oz (62.052 kg).  Physical Exam  General: The patient is alert and cooperative at the time of the examination.The patient is minimally obese.  Skin: No significant peripheral edema is noted.   Neurologic Exam  Mental status: The patient is alert and oriented x 3 at the time of the examination. The patient has apparent normal recent and remote memory, with an apparently normal attention span and concentration ability.   Cranial nerves: Facial symmetry is present. Speech is normal, no aphasia or dysarthria is noted. Extraocular movements are full. Visual fields are full.  Motor: The patient has good strength in all 4 extremities, with exception of some giveaway type weakness on the right arm.  Sensory  examination: Soft touch sensation is notable for decreased soft touch sensation on the right face, arms, and legs. The vibration sensation is decreased on the right arm and leg.  Coordination: The patient has good finger-nose-finger and heel-to-shin bilaterally.  Gait and station: The patient has a normal gait. Tandem gait is normal. Romberg is negative. No drift is seen.  Reflexes: Deep tendon reflexes are symmetric.   Assessment/Plan:  1. Fibromyalgia  2. Right hemisensory deficit  The patient will be increased on the gabapentin taking 300 mg 3 times daily for 2 weeks, then go to 300 mg twice during the day and 600 mg at night. The patient will follow-up in 6 months or sooner if needed. The patient may be indicated in getting trigger point injections in the future.    Jill Alexanders MD 08/04/2014 7:35  PM  [facility] [address] [place], [place] [address]  Phone [phone] Fax [phone]

## 2014-08-04 NOTE — Telephone Encounter (Signed)
Patient returned call. Please call and advise.  °

## 2014-08-05 ENCOUNTER — Ambulatory Visit: Payer: BLUE CROSS/BLUE SHIELD | Admitting: Psychiatry

## 2014-08-05 ENCOUNTER — Other Ambulatory Visit: Payer: Self-pay | Admitting: Internal Medicine

## 2014-08-05 ENCOUNTER — Telehealth: Payer: Self-pay | Admitting: Neurology

## 2014-08-05 DIAGNOSIS — M797 Fibromyalgia: Secondary | ICD-10-CM

## 2014-08-05 NOTE — Telephone Encounter (Signed)
I called patient, left a message. It does not appear that are working relationship with this patient is compatible, I have recommended a referral to another neurologic practice, if the patient is amenable to this, she is to let us know.

## 2014-08-05 NOTE — Telephone Encounter (Signed)
I called patient. The patient wishes to the referred to [facility] neurology in [place], [place]. Telephone number is [phone]. I will make the referral. The patient wishes to have trigger point injections to this office, I would prefer not to do this at this time as we have previously discussed. I do not believe that the patient should follow-up through this office, I wish to terminate our patient-physician relationship.

## 2014-08-06 ENCOUNTER — Other Ambulatory Visit: Payer: Self-pay | Admitting: Internal Medicine

## 2014-09-01 ENCOUNTER — Ambulatory Visit (INDEPENDENT_AMBULATORY_CARE_PROVIDER_SITE_OTHER): Payer: BLUE CROSS/BLUE SHIELD | Admitting: Psychiatry

## 2014-09-01 DIAGNOSIS — F332 Major depressive disorder, recurrent severe without psychotic features: Secondary | ICD-10-CM | POA: Diagnosis not present

## 2014-09-01 DIAGNOSIS — F909 Attention-deficit hyperactivity disorder, unspecified type: Secondary | ICD-10-CM

## 2014-11-19 ENCOUNTER — Ambulatory Visit (INDEPENDENT_AMBULATORY_CARE_PROVIDER_SITE_OTHER): Payer: No Typology Code available for payment source | Admitting: Psychiatry

## 2014-11-19 DIAGNOSIS — F909 Attention-deficit hyperactivity disorder, unspecified type: Secondary | ICD-10-CM | POA: Diagnosis not present

## 2014-11-19 DIAGNOSIS — F332 Major depressive disorder, recurrent severe without psychotic features: Secondary | ICD-10-CM

## 2014-11-24 ENCOUNTER — Ambulatory Visit: Payer: BLUE CROSS/BLUE SHIELD | Admitting: Psychiatry

## 2014-12-10 ENCOUNTER — Ambulatory Visit (INDEPENDENT_AMBULATORY_CARE_PROVIDER_SITE_OTHER): Payer: No Typology Code available for payment source | Admitting: Psychiatry

## 2014-12-10 DIAGNOSIS — F332 Major depressive disorder, recurrent severe without psychotic features: Secondary | ICD-10-CM | POA: Diagnosis not present

## 2014-12-10 DIAGNOSIS — F909 Attention-deficit hyperactivity disorder, unspecified type: Secondary | ICD-10-CM

## 2014-12-29 ENCOUNTER — Ambulatory Visit (INDEPENDENT_AMBULATORY_CARE_PROVIDER_SITE_OTHER): Payer: No Typology Code available for payment source | Admitting: Psychiatry

## 2014-12-29 DIAGNOSIS — F909 Attention-deficit hyperactivity disorder, unspecified type: Secondary | ICD-10-CM | POA: Diagnosis not present

## 2014-12-29 DIAGNOSIS — F332 Major depressive disorder, recurrent severe without psychotic features: Secondary | ICD-10-CM | POA: Diagnosis not present

## 2015-02-03 ENCOUNTER — Ambulatory Visit: Payer: BLUE CROSS/BLUE SHIELD | Admitting: Adult Health

## 2015-02-04 ENCOUNTER — Encounter: Payer: Self-pay | Admitting: Adult Health

## 2015-02-07 ENCOUNTER — Encounter: Payer: Self-pay | Admitting: Adult Health

## 2015-02-08 NOTE — Telephone Encounter (Signed)
This email must have been in response to a follow-up for a no show. Sounds as if her appointment was not canceled. Please call and offer our apologies. Thanks.

## 2015-02-24 ENCOUNTER — Ambulatory Visit (INDEPENDENT_AMBULATORY_CARE_PROVIDER_SITE_OTHER): Payer: 59 | Admitting: Licensed Clinical Social Worker

## 2015-02-24 DIAGNOSIS — F909 Attention-deficit hyperactivity disorder, unspecified type: Secondary | ICD-10-CM

## 2015-02-24 DIAGNOSIS — F332 Major depressive disorder, recurrent severe without psychotic features: Secondary | ICD-10-CM | POA: Diagnosis not present

## 2015-03-03 ENCOUNTER — Ambulatory Visit (INDEPENDENT_AMBULATORY_CARE_PROVIDER_SITE_OTHER): Payer: 59 | Admitting: Licensed Clinical Social Worker

## 2015-03-03 DIAGNOSIS — F909 Attention-deficit hyperactivity disorder, unspecified type: Secondary | ICD-10-CM

## 2015-03-03 DIAGNOSIS — F332 Major depressive disorder, recurrent severe without psychotic features: Secondary | ICD-10-CM | POA: Diagnosis not present

## 2015-03-22 ENCOUNTER — Ambulatory Visit (INDEPENDENT_AMBULATORY_CARE_PROVIDER_SITE_OTHER): Payer: 59 | Admitting: Licensed Clinical Social Worker

## 2015-03-22 DIAGNOSIS — F332 Major depressive disorder, recurrent severe without psychotic features: Secondary | ICD-10-CM | POA: Diagnosis not present

## 2015-03-22 DIAGNOSIS — F909 Attention-deficit hyperactivity disorder, unspecified type: Secondary | ICD-10-CM | POA: Diagnosis not present

## 2015-03-30 ENCOUNTER — Other Ambulatory Visit: Payer: Self-pay

## 2015-03-30 DIAGNOSIS — Z1231 Encounter for screening mammogram for malignant neoplasm of breast: Secondary | ICD-10-CM

## 2015-04-06 ENCOUNTER — Ambulatory Visit (INDEPENDENT_AMBULATORY_CARE_PROVIDER_SITE_OTHER): Payer: 59 | Admitting: Psychiatry

## 2015-04-06 DIAGNOSIS — F332 Major depressive disorder, recurrent severe without psychotic features: Secondary | ICD-10-CM | POA: Diagnosis not present

## 2015-04-06 DIAGNOSIS — F909 Attention-deficit hyperactivity disorder, unspecified type: Secondary | ICD-10-CM | POA: Diagnosis not present

## 2015-04-08 ENCOUNTER — Ambulatory Visit: Payer: Self-pay

## 2015-04-15 ENCOUNTER — Ambulatory Visit: Payer: Self-pay

## 2015-04-20 ENCOUNTER — Ambulatory Visit (INDEPENDENT_AMBULATORY_CARE_PROVIDER_SITE_OTHER): Payer: 59 | Admitting: Psychiatry

## 2015-04-20 DIAGNOSIS — F909 Attention-deficit hyperactivity disorder, unspecified type: Secondary | ICD-10-CM | POA: Diagnosis not present

## 2015-04-20 DIAGNOSIS — F322 Major depressive disorder, single episode, severe without psychotic features: Secondary | ICD-10-CM | POA: Diagnosis not present

## 2015-04-21 ENCOUNTER — Ambulatory Visit: Payer: Self-pay

## 2015-05-04 ENCOUNTER — Ambulatory Visit: Payer: 59 | Admitting: Psychiatry

## 2015-05-07 ENCOUNTER — Inpatient Hospital Stay: Admission: RE | Admit: 2015-05-07 | Payer: Self-pay | Source: Ambulatory Visit

## 2015-05-11 ENCOUNTER — Ambulatory Visit (INDEPENDENT_AMBULATORY_CARE_PROVIDER_SITE_OTHER): Payer: 59 | Admitting: Psychiatry

## 2015-05-11 DIAGNOSIS — F909 Attention-deficit hyperactivity disorder, unspecified type: Secondary | ICD-10-CM | POA: Diagnosis not present

## 2015-05-11 DIAGNOSIS — F332 Major depressive disorder, recurrent severe without psychotic features: Secondary | ICD-10-CM

## 2015-05-27 ENCOUNTER — Ambulatory Visit
Admission: RE | Admit: 2015-05-27 | Discharge: 2015-05-27 | Disposition: A | Discharge status: Home / Self Care | Payer: Medicare Other | Source: Ambulatory Visit

## 2015-05-27 DIAGNOSIS — Z1231 Encounter for screening mammogram for malignant neoplasm of breast: Secondary | ICD-10-CM

## 2015-05-28 ENCOUNTER — Other Ambulatory Visit: Payer: Self-pay | Admitting: Obstetrics & Gynecology

## 2015-05-28 DIAGNOSIS — R928 Other abnormal and inconclusive findings on diagnostic imaging of breast: Secondary | ICD-10-CM

## 2015-06-01 ENCOUNTER — Ambulatory Visit: Payer: 59 | Admitting: Psychiatry

## 2015-06-03 ENCOUNTER — Ambulatory Visit: Payer: Self-pay | Admitting: Psychiatry

## 2015-06-07 ENCOUNTER — Other Ambulatory Visit: Payer: Self-pay | Admitting: Family Medicine

## 2015-06-07 DIAGNOSIS — R928 Other abnormal and inconclusive findings on diagnostic imaging of breast: Secondary | ICD-10-CM

## 2015-06-08 ENCOUNTER — Ambulatory Visit
Admission: RE | Admit: 2015-06-08 | Discharge: 2015-06-08 | Disposition: A | Discharge status: Home / Self Care | Payer: Medicare Other | Source: Ambulatory Visit | Attending: Obstetrics & Gynecology | Admitting: Obstetrics & Gynecology

## 2015-06-08 DIAGNOSIS — R928 Other abnormal and inconclusive findings on diagnostic imaging of breast: Secondary | ICD-10-CM

## 2015-06-09 ENCOUNTER — Other Ambulatory Visit: Payer: Self-pay | Admitting: Otolaryngology

## 2015-06-17 ENCOUNTER — Ambulatory Visit (INDEPENDENT_AMBULATORY_CARE_PROVIDER_SITE_OTHER): Payer: Medicare Other | Admitting: Psychiatry

## 2015-06-17 DIAGNOSIS — F900 Attention-deficit hyperactivity disorder, predominantly inattentive type: Secondary | ICD-10-CM | POA: Diagnosis not present

## 2015-06-17 DIAGNOSIS — F3341 Major depressive disorder, recurrent, in partial remission: Secondary | ICD-10-CM

## 2015-07-01 ENCOUNTER — Ambulatory Visit (INDEPENDENT_AMBULATORY_CARE_PROVIDER_SITE_OTHER): Payer: Medicare Other | Admitting: Psychiatry

## 2015-07-01 DIAGNOSIS — F3341 Major depressive disorder, recurrent, in partial remission: Secondary | ICD-10-CM | POA: Diagnosis not present

## 2015-07-01 DIAGNOSIS — F901 Attention-deficit hyperactivity disorder, predominantly hyperactive type: Secondary | ICD-10-CM

## 2015-07-05 ENCOUNTER — Other Ambulatory Visit: Payer: Self-pay | Admitting: Otolaryngology

## 2015-07-13 ENCOUNTER — Ambulatory Visit (INDEPENDENT_AMBULATORY_CARE_PROVIDER_SITE_OTHER): Payer: Medicare Other | Admitting: Psychiatry

## 2015-07-13 DIAGNOSIS — F3341 Major depressive disorder, recurrent, in partial remission: Secondary | ICD-10-CM

## 2015-07-13 DIAGNOSIS — F901 Attention-deficit hyperactivity disorder, predominantly hyperactive type: Secondary | ICD-10-CM | POA: Diagnosis not present

## 2015-07-27 ENCOUNTER — Ambulatory Visit (INDEPENDENT_AMBULATORY_CARE_PROVIDER_SITE_OTHER): Payer: 59 | Admitting: Psychiatry

## 2015-07-27 DIAGNOSIS — F3341 Major depressive disorder, recurrent, in partial remission: Secondary | ICD-10-CM | POA: Diagnosis not present

## 2015-07-27 DIAGNOSIS — F901 Attention-deficit hyperactivity disorder, predominantly hyperactive type: Secondary | ICD-10-CM | POA: Diagnosis not present

## 2015-08-12 ENCOUNTER — Ambulatory Visit: Payer: Medicare Other | Admitting: Psychiatry

## 2015-08-17 ENCOUNTER — Ambulatory Visit (INDEPENDENT_AMBULATORY_CARE_PROVIDER_SITE_OTHER): Payer: 59 | Admitting: Psychiatry

## 2015-08-17 DIAGNOSIS — F3341 Major depressive disorder, recurrent, in partial remission: Secondary | ICD-10-CM

## 2015-08-17 DIAGNOSIS — F901 Attention-deficit hyperactivity disorder, predominantly hyperactive type: Secondary | ICD-10-CM | POA: Diagnosis not present

## 2015-08-23 ENCOUNTER — Ambulatory Visit: Payer: Medicare Other | Admitting: Podiatry

## 2015-08-28 ENCOUNTER — Encounter: Payer: Self-pay | Admitting: Podiatry

## 2015-09-01 ENCOUNTER — Other Ambulatory Visit: Payer: Self-pay | Admitting: Family Medicine

## 2015-09-01 ENCOUNTER — Telehealth (HOSPITAL_COMMUNITY): Payer: Self-pay | Admitting: Family Medicine

## 2015-09-01 DIAGNOSIS — R55 Syncope and collapse: Secondary | ICD-10-CM

## 2015-09-01 NOTE — Telephone Encounter (Signed)
Close encounter 

## 2015-09-02 ENCOUNTER — Ambulatory Visit: Payer: 59 | Admitting: Psychiatry

## 2015-09-09 ENCOUNTER — Encounter (INDEPENDENT_AMBULATORY_CARE_PROVIDER_SITE_OTHER): Payer: Medicare Other | Admitting: Podiatry

## 2015-09-09 NOTE — Progress Notes (Signed)
This encounter was created in error - please disregard.

## 2015-09-16 ENCOUNTER — Other Ambulatory Visit (HOSPITAL_COMMUNITY): Payer: Self-pay

## 2015-09-16 ENCOUNTER — Ambulatory Visit (INDEPENDENT_AMBULATORY_CARE_PROVIDER_SITE_OTHER): Payer: Medicare Other | Admitting: Psychiatry

## 2015-09-16 ENCOUNTER — Ambulatory Visit (HOSPITAL_COMMUNITY): Payer: Medicare Other | Attending: Cardiology

## 2015-09-16 DIAGNOSIS — F3341 Major depressive disorder, recurrent, in partial remission: Secondary | ICD-10-CM

## 2015-09-16 DIAGNOSIS — F901 Attention-deficit hyperactivity disorder, predominantly hyperactive type: Secondary | ICD-10-CM | POA: Diagnosis not present

## 2015-09-16 DIAGNOSIS — R55 Syncope and collapse: Secondary | ICD-10-CM | POA: Diagnosis not present

## 2015-09-22 ENCOUNTER — Ambulatory Visit (INDEPENDENT_AMBULATORY_CARE_PROVIDER_SITE_OTHER): Payer: Medicare Other | Admitting: Podiatry

## 2015-09-22 ENCOUNTER — Ambulatory Visit: Payer: Medicare Other | Admitting: Podiatry

## 2015-09-22 ENCOUNTER — Encounter: Payer: Self-pay | Admitting: Podiatry

## 2015-09-22 VITALS — BP 141/85 | HR 92 | Resp 12

## 2015-09-22 DIAGNOSIS — L03032 Cellulitis of left toe: Secondary | ICD-10-CM | POA: Diagnosis not present

## 2015-09-22 NOTE — Progress Notes (Signed)
   Subjective:    Patient ID: Jordan [name], female    DOB: 1961-01-11, 55 y.o.   MRN: [id number]  HPI this patient presents to the office concerned about her big toe, right foot. She says that last week she had redness and swelling noted on the inside border the big toe, right foot. She was seen by her medical doctor who thought she may have an infection and prescribed doxycycline. She was also told to soak her foot and to monitor the healing progress. She presents the office today for second opinion on her healing toe    Review of Systems  All other systems reviewed and are negative.      Objective:   Physical Exam GENERAL APPEARANCE: Alert, conversant. Appropriately groomed. No acute distress.  VASCULAR: Pedal pulses are  palpable at  [facility] and PT bilateral.  Capillary refill time is immediate to all digits,  Normal temperature gradient.  Digital hair growth is present bilateral  NEUROLOGIC: sensation is normal to 5.07 monofilament at 5/5 sites bilateral.  Light touch is intact bilateral, Muscle strength normal.  MUSCULOSKELETAL: acceptable muscle strength, tone and stability bilateral.  Intrinsic muscluature intact bilateral.  Rectus appearance of foot and digits noted bilateral.   DERMATOLOGIC: skin color, texture, and turgor are within normal limits.  No preulcerative lesions or ulcers  are seen, no interdigital maceration noted.  No open lesions present.  . No drainage noted.  NAILS  no evidence of any nail on the great toes both feet as well as the second toe right foot. There is no evidence of any redness, swelling or drainage. Examination of the medial border of the right hallux area reveals a skin lesion, but no pus or drainage noted from the site. This appears to have healed based on how she described the area one week ago         Assessment & Plan:  Paronychia right hallux.  IE  Examined site of nail.  Told her to soak her feet and take the antibiotic as needed.  RTC  prn   Gardiner Barefoot DPM

## 2015-09-23 ENCOUNTER — Institutional Professional Consult (permissible substitution) (HOSPITAL_COMMUNITY): Payer: Self-pay

## 2015-09-27 ENCOUNTER — Other Ambulatory Visit (HOSPITAL_COMMUNITY): Payer: Medicare Other | Attending: Psychiatry | Admitting: Psychiatry

## 2015-09-27 ENCOUNTER — Encounter (HOSPITAL_COMMUNITY): Payer: Self-pay | Admitting: Psychiatry

## 2015-09-27 DIAGNOSIS — F329 Major depressive disorder, single episode, unspecified: Secondary | ICD-10-CM | POA: Insufficient documentation

## 2015-09-27 DIAGNOSIS — F332 Major depressive disorder, recurrent severe without psychotic features: Secondary | ICD-10-CM | POA: Insufficient documentation

## 2015-09-27 NOTE — Progress Notes (Signed)
Psychiatric Initial Adult Assessment   Patient Identification: Jordan [name] MRN:  [id number] Date of Evaluation:  09/27/2015 Referral Source:  Dr [facility] Chief Complaint: depression not responding to medications and therapy  Visit Diagnosis: Major depression, recurrent, severe without psychosis  History of Present Illness:  Jordan [name] has been depressed all her life she believes.  She has been treated all her adult life with medications including all the major antidepressants and augmentations, multiple inpatient hospitalizations and outpatient therapy for years.  Nothing has ever helped enough that she recall is as helpful she says.  Currently she remains depressed with sadness, no motivation, interest or energy, sleeping excessively, no joy in activities or friends, avoiding friends and activities.  She is forcing herself to get out and do things but frequently signs up for things and does not show up she says.  She loves her dog and when the dog dies she says she will probably go with him.  No current suicidal thoughts but would be okay if she died.  She does make cuts on her fingers and toes.  She is divorced and has no children.  Both parents are deceased.  No metal implants, no history of psychosis or seizures, no bipolar disorder   Associated Signs/Symptoms: Depression Symptoms:  depressed mood, anhedonia, hypersomnia, fatigue, difficulty concentrating, impaired memory, recurrent thoughts of death, anxiety, (Hypo) Manic Symptoms:  Irritable Mood, Anxiety Symptoms:  Excessive Worry, Psychotic Symptoms:  none PTSD Symptoms: Negative  Past Psychiatric History: inpatient on several times, years of therapy and medications  Previous Psychotropic Medications: Yes   Substance Abuse History in the last 12 months:  No.  Consequences of Substance Abuse: Negative  Past Medical History:  Past Medical History:  Diagnosis Date  . Acute upper respiratory infections of unspecified site   .  ADD (attention deficit disorder)   . Allergic rhinitis due to pollen   . Anal fissure   . Anemia, unspecified   . Anxiety   . Anxiety disorder   . Complication of anesthesia    wakes up slow  . Depressive disorder, not elsewhere classified   . Diffuse cystic mastopathy   . Falls   . Fibromyalgia   . GERD (gastroesophageal reflux disease)   . Headache(784.0)   . History of anorexia nervosa   . History of bulimia   . IBS (irritable bowel syndrome)   . 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 Lump or mass in breast   . Meningitis, unspecified(322.9)   . Other specified glaucoma   . Perforation of tympanic membrane, unspecified   . Radial styloid tenosynovitis   . Seizures ([place])     Past Surgical History:  Procedure Laterality Date  . ANKLE SURGERY  1992   right ankle - scar tissue  . DILATION AND CURETTAGE OF UTERUS  2006   History of Menometrorrhagia  . [place] to 1986   6 surgeries on right ear  . LAPAROSCOPY  1993   normal  . uterine ablation  2011  . WRIST SURGERY  2001   for de-quervaine - bilateral wrists - tendonitis    Family Psychiatric History: mother controlling, father supportive, sister a bully and still is, no other family, loved her job but is disabled due to depression  Family History:  Family History  Problem Relation Age of Onset  . Ovarian cancer Mother   . Heart disease Mother   . Hypertension Mother   . Coronary artery disease Mother   .  Colon polyps Mother 59  . Heart failure Father   . Hypertension Father   . Hyperlipidemia Father   . Diabetes Father   . Breast cancer Maternal Grandmother   . Colon cancer Neg Hx   . Esophageal cancer Neg Hx   . Stomach cancer Neg Hx   . Rectal cancer Neg Hx     Social History:   Social History   Social History  . Marital status: Divorced    Spouse name: N/A  . Number of children: 0  . Years of education: Masters   Occupational History  . part time     Social History Main Topics  .  Smoking status: Never Smoker  . Smokeless tobacco: Never Used  . Alcohol use 8.4 oz/week    14 Glasses of wine per week     Comment: 2 glasses of wine/Day  . Drug use: No  . Sexual activity: Not on file   Other Topics Concern  . Not on file   Social History Narrative   [employer], [employer] BA, App for MA. 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 Married 5 years - divorced, no children   Patient is right handed   Patient drinks 1 cup of coffee daily.                   Additional Social History: has a Scientist, water quality  Allergies:   Allergies  Allergen Reactions  . Mirtazapine Other (See Comments)    Metabolic Disorder Labs: Lab Results  Component Value Date   HGBA1C 5.0 08/15/2013   No results found for: PROLACTIN Lab Results  Component Value Date   CHOL 210 (H) 08/15/2013   TRIG 248.0 (H) 08/15/2013   HDL 54.70 08/15/2013   CHOLHDL 4 08/15/2013   VLDL 49.6 (H) 08/15/2013   LDLCALC 106 (H) 08/15/2013   LDLCALC 96 01/02/2012     Current Medications: Current Outpatient Prescriptions  Medication Sig Dispense Refill  . ALPRAZolam (XANAX) 0.5 MG tablet Take 0.5 mg by mouth at bedtime as needed. For sleep.  Also 1/2 tab during the day.    . amphetamine-dextroamphetamine (ADDERALL XR) 30 MG 24 hr capsule Take 30 mg by mouth daily.    Marland Kitchen amphetamine-dextroamphetamine (ADDERALL) 30 MG tablet Take 30 mg by mouth 3 (three) times daily.     . ARIPiprazole (ABILIFY) 5 MG tablet Take 5 mg by mouth daily. At bedtime    . baclofen (LIORESAL) 20 MG tablet Take 0.5 tablets (10 mg total) by mouth 3 (three) times daily. (Patient not taking: Reported on 09/22/2015) 30 each 6  . cyclobenzaprine (FLEXERIL) 10 MG tablet Take 10 mg by mouth at bedtime.    . DULoxetine (CYMBALTA) 60 MG capsule Take by mouth 2 (two) times daily.     . fluticasone (FLONASE) 50 MCG/ACT nasal spray Place 1 spray into both nostrils daily.  16 g 3  . gabapentin (NEURONTIN) 300 MG capsule One capsule three times a day for 2 weeks, then take one twice during the day and 2 capsules at night (Patient not taking: Reported on 09/22/2015) 120 capsule 3  . meloxicam (MOBIC) 15 MG tablet TAKE 1 TABLET BY MOUTH ONCE DAILY (Patient not taking: Reported on 09/22/2015) 90 tablet 1  . meloxicam (MOBIC) 15 MG tablet TAKE 1 TABLET BY MOUTH ONCE DAILY (Patient not taking: Reported on 09/22/2015) 90 tablet 1  . omeprazole (PRILOSEC  OTC) 20 MG tablet Take 20 mg by mouth daily.    . pantoprazole (PROTONIX) 40 MG tablet Take 40 mg by mouth daily.     No current facility-administered medications for this visit.     Neurologic: Headache: Negative Seizure: Negative Paresthesias:Negative  Musculoskeletal: Strength & Muscle Tone: within normal limits Gait & Station: normal Patient leans: N/A  Psychiatric Specialty Exam: ROS  There were no vitals taken for this visit.There is no height or weight on file to calculate BMI.  General Appearance: Fairly Groomed  Eye Contact:  Good  Speech:  Clear and Coherent  Volume:  Normal  Mood:  Depressed  Affect:  Congruent  Thought Process:  Coherent  Orientation:  Negative  Thought Content:  Logical  Suicidal Thoughts:  No  Homicidal Thoughts:  No  Memory:  Immediate;   Good Recent;   Good Remote;   Good  Judgement:  Good  Insight:  Good  Psychomotor Activity:  Normal  Concentration:  Concentration: Good and Attention Span: Good  Recall:  Good  Fund of Knowledge:Good  Language: Good  Akathisia:  Negative  Handed:  Right  AIMS (if indicated):  0  Assets:  Communication Skills Desire for Improvement Financial Resources/Insurance Housing Intimacy Leisure Time Physical Health Resilience Social Support Talents/Skills Transportation Vocational/Educational  ADL's:  Intact  Cognition: WNL  Sleep:  Too much    Treatment Plan Summary: Qualifies for [place]   Donnelly Angelica, MD 8/7/20172:43 PM

## 2015-09-30 ENCOUNTER — Ambulatory Visit: Payer: Medicare Other | Admitting: Psychiatry

## 2015-10-13 ENCOUNTER — Other Ambulatory Visit (INDEPENDENT_AMBULATORY_CARE_PROVIDER_SITE_OTHER): Payer: Medicare Other | Admitting: Psychiatry

## 2015-10-13 DIAGNOSIS — F332 Major depressive disorder, recurrent severe without psychotic features: Secondary | ICD-10-CM

## 2015-10-13 DIAGNOSIS — F329 Major depressive disorder, single episode, unspecified: Secondary | ICD-10-CM | POA: Diagnosis not present

## 2015-10-13 NOTE — Progress Notes (Signed)
Pt reported to [facility] for cortical mapping and motor threshold determination for Repetitive Transcranial Magnetic Stimulation treatment for Major Depressive Disorder. Pt completed a PHQ-9 with a score of 21 ( severe depression). Pt also completed a Beck's Depression Inventory with a score of 44 (severe depression). Prior to procedure, pt signed an informed consent agreement for [place] treatment. Pt's treatment area was found by applying single pulses to her left motor cortex, hunting along the anterior/posterior plane and along the superior oblique angle until the best motor response was elicited from the pt's right thumb. The best response was observed at 4.5 A/P and 43 degrees SOA, with a coil angle of 5 degrees. Pt's motor threshold was calculated using the [employer]'s proprietary MT Assist algorithm, which produced a calculated motor threshold of .98 SMT. Per these findings, pt's treatment parameters are as follows: A/P -- 10 cm, SOA -- 43 degrees, Coil Angle -- 5 degrees, Motor Threshold -- .87 SMT. With these parameters, the pt will receive 36 sessions of TMS according to the following protocol: 3000 pulses per session, with stimulation in bursts of pulses lasting 4 seconds at a frequency of 10 Hz, separated by 26 seconds of rest. After determining pt's tx parameters, coil was moved to the treatment location, and the first burst of pulses was applied at a reduced power of 80%MT. Pt reported no complaints, and stated that the stimulation was tolerable, so the first tx session was given to the pt. Stimulation power was gradually titrated up from 80%MT to 120%MT. Pt tolerated tx well. Pt and wife left with no complaints post-tx. Pt departed from clinic without issue.

## 2015-10-13 NOTE — Progress Notes (Signed)
  Patient ID: Jordan [name], female   DOB: [birth year], 55 y.o.   MRN: [id number] Jordan [name] came for her initial mapping and transcranial magnetic stimulation session.  Mapping was uneventful.  Coordinates were SMT 0.87, SOA 43, A/P 10 coil angle 5 degrees.

## 2015-10-14 ENCOUNTER — Encounter (HOSPITAL_COMMUNITY): Payer: Self-pay | Admitting: Emergency Medicine

## 2015-10-15 ENCOUNTER — Encounter (HOSPITAL_COMMUNITY): Payer: Self-pay

## 2015-10-18 ENCOUNTER — Encounter (HOSPITAL_COMMUNITY): Payer: Self-pay

## 2015-10-18 ENCOUNTER — Other Ambulatory Visit (INDEPENDENT_AMBULATORY_CARE_PROVIDER_SITE_OTHER): Payer: Medicare Other | Admitting: Emergency Medicine

## 2015-10-18 DIAGNOSIS — F329 Major depressive disorder, single episode, unspecified: Secondary | ICD-10-CM | POA: Diagnosis not present

## 2015-10-18 DIAGNOSIS — F332 Major depressive disorder, recurrent severe without psychotic features: Secondary | ICD-10-CM | POA: Diagnosis not present

## 2015-10-18 NOTE — Progress Notes (Signed)
Pt reported to [facility] for Repetitive Transcranial Magnetic Stimulation treatment for Major Depressive Disorder. Pt presented with pleasant affect. Pt reported no change in alcohol/substance use, caffeine consumption, sleep pattern or metal implant status since previous tx. Pt watched TV and engaged in conversation during treatment. Pt missed last two sessions because she was experiencing pain on the right side of her head. Power kept at 120% for the duration of tx.Pt with no complaints post-tx. Pt left clinic with no problems or issues.

## 2015-10-19 ENCOUNTER — Other Ambulatory Visit (INDEPENDENT_AMBULATORY_CARE_PROVIDER_SITE_OTHER): Payer: Medicare Other

## 2015-10-19 ENCOUNTER — Encounter (HOSPITAL_COMMUNITY): Payer: Self-pay

## 2015-10-19 DIAGNOSIS — F332 Major depressive disorder, recurrent severe without psychotic features: Secondary | ICD-10-CM | POA: Diagnosis not present

## 2015-10-19 DIAGNOSIS — F329 Major depressive disorder, single episode, unspecified: Secondary | ICD-10-CM | POA: Diagnosis not present

## 2015-10-19 NOTE — Progress Notes (Signed)
Patient ID: Jordan [name], female   DOB: 24-Aug-1960, 55 y.o.   MRN: [id number] Pt reported to [facility] for Repetitive Transcranial Magnetic Stimulation treatment for Major Depressive Disorder. Pt presented with pleasant affect. Pt reported no change in alcohol/substance use, caffeine consumption, sleep pattern or metal implant status since previous tx. Pt watched TV and listened to music during tx. Pt stated that she is still having pain and it is hard getting through the tx sessions. Power kept at 120% for the duration of tx.Pt with no complaints post-tx. Pt left clinic with no problems or issues.

## 2015-10-20 ENCOUNTER — Encounter (HOSPITAL_COMMUNITY): Payer: Self-pay

## 2015-10-20 ENCOUNTER — Other Ambulatory Visit (INDEPENDENT_AMBULATORY_CARE_PROVIDER_SITE_OTHER): Payer: Medicare Other | Admitting: Emergency Medicine

## 2015-10-20 DIAGNOSIS — F332 Major depressive disorder, recurrent severe without psychotic features: Secondary | ICD-10-CM

## 2015-10-20 DIAGNOSIS — F329 Major depressive disorder, single episode, unspecified: Secondary | ICD-10-CM | POA: Diagnosis not present

## 2015-10-20 NOTE — Progress Notes (Signed)
Pt reported to [facility] for Repetitive Transcranial Magnetic Stimulation treatment for Major Depressive Disorder. Pt presented with pleasant affect. Pt reported no change in alcohol/substance use, caffeine consumption, sleep pattern or metal implant status since previous tx. Pt watched TV and listened to music during tx. Pt stated that she is still having pain and it is hard getting through the tx sessions. Pt stated that this treatment has been helping her with her anxiety and calming her thoughts. Pt completed the PHQ-9 and scored a 14.  Power kept at 120% for the duration of tx.Pt with no complaints post-tx. Pt left clinic with no problems or issues.

## 2015-10-21 ENCOUNTER — Other Ambulatory Visit (INDEPENDENT_AMBULATORY_CARE_PROVIDER_SITE_OTHER): Payer: Medicare Other | Admitting: Emergency Medicine

## 2015-10-21 ENCOUNTER — Encounter (HOSPITAL_COMMUNITY): Payer: Self-pay

## 2015-10-21 DIAGNOSIS — F329 Major depressive disorder, single episode, unspecified: Secondary | ICD-10-CM | POA: Diagnosis not present

## 2015-10-21 DIAGNOSIS — F332 Major depressive disorder, recurrent severe without psychotic features: Secondary | ICD-10-CM

## 2015-10-21 NOTE — Progress Notes (Signed)
Pt reported to [facility] for Repetitive Transcranial Magnetic Stimulation treatment for Major Depressive Disorder. Pt presented with pleasant affect. Pt reported no change in alcohol/substance use, caffeine consumption, sleep pattern or metal implant status since previous tx. Pt watched TV and listened to music during tx. Pt stated that she is still having pain and it is hard getting through the tx sessions. Power kept at 120% for the duration of tx.Pt with no complaints post-tx. Pt left clinic with no problems or issues.

## 2015-10-22 ENCOUNTER — Other Ambulatory Visit (HOSPITAL_COMMUNITY): Payer: Medicare Other | Attending: Psychiatry

## 2015-10-22 ENCOUNTER — Encounter (HOSPITAL_COMMUNITY): Payer: Self-pay

## 2015-10-22 DIAGNOSIS — F332 Major depressive disorder, recurrent severe without psychotic features: Secondary | ICD-10-CM | POA: Insufficient documentation

## 2015-10-22 NOTE — Progress Notes (Signed)
Patient ID: Jordan [name], female   DOB: [birth year], 55 y.o.   MRN: [id number] Pt reported to [facility] for Repetitive Transcranial Magnetic Stimulation treatment for Major Depressive Disorder. Pt presented with pleasant affect. Pt reported no change in alcohol/substance use, caffeine consumption, sleep pattern or metal implant status since previous tx. Pt watched TV and listened to music during tx. Pt stated that she is proud of herself for making it one week. Pt also stated that her mind has been at ease since treatment and she feels calm. Power kept at 120% for the duration of tx.Pt with no complaints post-tx. Pt left clinic with no problems or issues.

## 2015-10-26 ENCOUNTER — Other Ambulatory Visit (INDEPENDENT_AMBULATORY_CARE_PROVIDER_SITE_OTHER): Payer: Medicare Other | Admitting: Emergency Medicine

## 2015-10-26 DIAGNOSIS — F332 Major depressive disorder, recurrent severe without psychotic features: Secondary | ICD-10-CM | POA: Diagnosis not present

## 2015-10-26 NOTE — Progress Notes (Signed)
Pt reported to [facility] for Repetitive Transcranial Magnetic Stimulation treatment for Major Depressive Disorder. Pt presented with pleasant affect. Pt reported no change in alcohol/substance use, caffeine consumption, sleep pattern or metal implant status since previous tx. Pt watched TV and listened to music during tx. . Pt also stated that her mind has been at ease since treatment and she feels calm. Power kept at 120% for the duration of tx.Pt with no complaints post-tx. Pt left clinic with no problems or issues.

## 2015-10-27 ENCOUNTER — Other Ambulatory Visit (INDEPENDENT_AMBULATORY_CARE_PROVIDER_SITE_OTHER): Payer: Medicare Other | Admitting: Emergency Medicine

## 2015-10-27 DIAGNOSIS — F332 Major depressive disorder, recurrent severe without psychotic features: Secondary | ICD-10-CM

## 2015-10-27 NOTE — Progress Notes (Signed)
Pt reported to [facility] for Repetitive Transcranial Magnetic Stimulation treatment for Major Depressive Disorder. Pt presented with pleasant affect. Pt reported no change in alcohol/substance use, caffeine consumption, sleep pattern or metal implant status since previous tx. Pt watched TV and listened to music during tx. . Power kept at 120% for the duration of tx.Pt with no complaints post-tx. Pt left clinic with no problems or issues.

## 2015-10-28 ENCOUNTER — Other Ambulatory Visit (INDEPENDENT_AMBULATORY_CARE_PROVIDER_SITE_OTHER): Payer: Medicare Other | Admitting: Emergency Medicine

## 2015-10-28 DIAGNOSIS — F332 Major depressive disorder, recurrent severe without psychotic features: Secondary | ICD-10-CM

## 2015-10-28 NOTE — Progress Notes (Signed)
Pt reported to [facility] for Repetitive Transcranial Magnetic Stimulation treatment for Major Depressive Disorder. Pt presented with pleasant affect. Pt reported no change in alcohol/substance use, caffeine consumption, sleep pattern or metal implant status since previous tx. Pt watched TV and listened to music during tx. . Power kept at 120% for the duration of tx.Pt with no complaints post-tx. Pt left clinic with no problems or issues.

## 2015-10-29 ENCOUNTER — Other Ambulatory Visit (INDEPENDENT_AMBULATORY_CARE_PROVIDER_SITE_OTHER): Payer: Medicare Other | Admitting: Emergency Medicine

## 2015-10-29 DIAGNOSIS — F332 Major depressive disorder, recurrent severe without psychotic features: Secondary | ICD-10-CM | POA: Diagnosis not present

## 2015-10-29 NOTE — Progress Notes (Signed)
Pt reported to [facility] for Repetitive Transcranial Magnetic Stimulation treatment for Major Depressive Disorder. Pt presented with pleasant affect. Pt reported no change in alcohol/substance use, caffeine consumption, sleep pattern or metal implant status since previous tx. Pt watched TV and listened to music during tx. . Power kept at 120% for the duration of tx.Pt with no complaints post-tx. Pt left clinic with no problems or issues.

## 2015-11-01 ENCOUNTER — Other Ambulatory Visit (INDEPENDENT_AMBULATORY_CARE_PROVIDER_SITE_OTHER): Payer: Medicare Other | Admitting: Emergency Medicine

## 2015-11-01 DIAGNOSIS — F332 Major depressive disorder, recurrent severe without psychotic features: Secondary | ICD-10-CM | POA: Diagnosis not present

## 2015-11-01 NOTE — Progress Notes (Signed)
Pt reported to [facility] for Repetitive Transcranial Magnetic Stimulation treatment for Major Depressive Disorder. Pt presented with pleasant affect. Pt reported no change in alcohol/substance use, caffeine consumption, sleep pattern or metal implant status since previous tx. Pt watched TV and listened to music during tx. . Power kept at 120% for the duration of tx.Pt with no complaints post-tx. Pt left clinic with no problems or issues.

## 2015-11-02 ENCOUNTER — Encounter (HOSPITAL_COMMUNITY): Payer: Self-pay | Admitting: Emergency Medicine

## 2015-11-03 ENCOUNTER — Other Ambulatory Visit (INDEPENDENT_AMBULATORY_CARE_PROVIDER_SITE_OTHER): Payer: Medicare Other | Admitting: Emergency Medicine

## 2015-11-03 DIAGNOSIS — F332 Major depressive disorder, recurrent severe without psychotic features: Secondary | ICD-10-CM | POA: Diagnosis not present

## 2015-11-03 NOTE — Progress Notes (Signed)
Pt reported to [facility] for Repetitive Transcranial Magnetic Stimulation treatment for Major Depressive Disorder. Pt presented with pleasant affect. Pt reported no change in alcohol/substance use, caffeine consumption, sleep pattern or metal implant status since previous tx. Pt has an eye infection and went to urgent care - it is being treated. Pt used ipad and listened to music. Power kept at 120% for the duration of tx.Pt with no complaints post-tx. Pt left clinic with no problems or issues.

## 2015-11-04 ENCOUNTER — Other Ambulatory Visit (INDEPENDENT_AMBULATORY_CARE_PROVIDER_SITE_OTHER): Payer: Medicare Other | Admitting: Emergency Medicine

## 2015-11-04 DIAGNOSIS — F332 Major depressive disorder, recurrent severe without psychotic features: Secondary | ICD-10-CM | POA: Diagnosis not present

## 2015-11-04 NOTE — Progress Notes (Signed)
Pt reported to [facility] for Repetitive Transcranial Magnetic Stimulation treatment for Major Depressive Disorder. Pt presented with pleasant affect. Pt reported no change in alcohol/substance use, caffeine consumption, sleep pattern or metal implant status since previous tx.  Pt used ipad and listened to music. Power kept at 120% for the duration of tx.Pt with no complaints post-tx. Pt left clinic with no problems or issues.

## 2015-11-05 ENCOUNTER — Encounter (HOSPITAL_COMMUNITY): Payer: Self-pay

## 2015-11-08 ENCOUNTER — Other Ambulatory Visit (INDEPENDENT_AMBULATORY_CARE_PROVIDER_SITE_OTHER): Payer: Medicare Other | Admitting: Emergency Medicine

## 2015-11-08 DIAGNOSIS — F332 Major depressive disorder, recurrent severe without psychotic features: Secondary | ICD-10-CM

## 2015-11-08 NOTE — Progress Notes (Signed)
Pt reported to [facility] for Repetitive Transcranial Magnetic Stimulation treatment for Major Depressive Disorder. Pt presented with pleasant affect. Pt reported no change in alcohol/substance use, caffeine consumption, sleep pattern or metal implant status since previous tx.  Pt used ipad and listened to music. Power kept at 120% for the duration of tx.Pt with no complaints post-tx. Pt left clinic with no problems or issues.

## 2015-11-09 ENCOUNTER — Other Ambulatory Visit (INDEPENDENT_AMBULATORY_CARE_PROVIDER_SITE_OTHER): Payer: Medicare Other | Admitting: Emergency Medicine

## 2015-11-09 DIAGNOSIS — F332 Major depressive disorder, recurrent severe without psychotic features: Secondary | ICD-10-CM | POA: Diagnosis not present

## 2015-11-09 NOTE — Progress Notes (Signed)
Pt reported to [facility] for Repetitive Transcranial Magnetic Stimulation treatment for Major Depressive Disorder. Pt presented with pleasant affect. Pt reported no change in alcohol/substance use, caffeine consumption, sleep pattern or metal implant status since previous tx. Pt stated that she has been dealing with stressor in [place]. Pt mentioned she has come up with coping skills to deal with issues.. Power kept at 120% for the duration of tx.Pt with no complaints post-tx. Pt left clinic with no problems or issues.

## 2015-11-10 ENCOUNTER — Other Ambulatory Visit (INDEPENDENT_AMBULATORY_CARE_PROVIDER_SITE_OTHER): Payer: Medicare Other

## 2015-11-10 DIAGNOSIS — F332 Major depressive disorder, recurrent severe without psychotic features: Secondary | ICD-10-CM

## 2015-11-10 NOTE — Progress Notes (Signed)
Patient ID: Jordan [name], female   DOB: 02/19/1961, 55 y.o.   MRN: [id number] Pt reported to [facility] for Repetitive Transcranial Magnetic Stimulation treatment for Major Depressive Disorder. Pt presented with pleasant affect. Pt reported no change in alcohol/substance use, caffeine consumption, sleep pattern or metal implant status since previous tx. Pt completed PHQ-9, totaling 10. Power kept at 120% for the duration of tx.Pt with no complaints post-tx. Pt left clinic with no problems or issues.

## 2015-11-11 ENCOUNTER — Other Ambulatory Visit (INDEPENDENT_AMBULATORY_CARE_PROVIDER_SITE_OTHER): Payer: Medicare Other | Admitting: Emergency Medicine

## 2015-11-11 DIAGNOSIS — F332 Major depressive disorder, recurrent severe without psychotic features: Secondary | ICD-10-CM

## 2015-11-11 NOTE — Progress Notes (Signed)
Pt reported to [facility] for Repetitive Transcranial Magnetic Stimulation treatment for Major Depressive Disorder. Pt presented with pleasant affect. Pt reported no change in alcohol/substance use, caffeine consumption, sleep pattern or metal implant status since previous tx. Pt expressed that she will be starting a [place] diary documenting her experiences. Power kept at 120% for the duration of tx.Pt with no complaints post-tx. Pt left clinic with no problems or issues.

## 2015-11-12 ENCOUNTER — Other Ambulatory Visit (INDEPENDENT_AMBULATORY_CARE_PROVIDER_SITE_OTHER): Payer: Medicare Other | Admitting: Emergency Medicine

## 2015-11-12 DIAGNOSIS — F332 Major depressive disorder, recurrent severe without psychotic features: Secondary | ICD-10-CM

## 2015-11-12 NOTE — Progress Notes (Signed)
Patient ID: Jordan [name], female   DOB: 20-Oct-1960, 55 y.o.   MRN: [id number] Pt reported to [facility] for Repetitive Transcranial Magnetic Stimulation treatment for Major Depressive Disorder. Pt presented with pleasant affect. Pt reported no change in alcohol/substance use, caffeine consumption, sleep pattern or metal implant status since previous tx. Pt expressed that she will be starting a [place] diary documenting her experiences. Power kept at 120% for the duration of tx.Pt with no complaints post-tx. Pt left clinic with no problems or issues.

## 2015-11-15 ENCOUNTER — Other Ambulatory Visit (INDEPENDENT_AMBULATORY_CARE_PROVIDER_SITE_OTHER): Payer: Medicare Other | Admitting: Emergency Medicine

## 2015-11-15 DIAGNOSIS — F332 Major depressive disorder, recurrent severe without psychotic features: Secondary | ICD-10-CM

## 2015-11-15 NOTE — Progress Notes (Signed)
Pt reported to [facility] for Repetitive Transcranial Magnetic Stimulation treatment for Major Depressive Disorder. Pt presented with pleasant affect. Pt reported no change in alcohol/substance use, caffeine consumption, sleep pattern or metal implant status since previous tx. Pt stated that she had a good weekend - finished renovating her bathroom. Also, pt has been able to handle altercations in an appropriate manner. Pt has been noticing big differences from this treatment.Pt with no complaints post-tx. Pt left clinic with no problems or issues.

## 2015-11-16 ENCOUNTER — Other Ambulatory Visit (INDEPENDENT_AMBULATORY_CARE_PROVIDER_SITE_OTHER): Payer: Medicare Other | Admitting: Emergency Medicine

## 2015-11-16 DIAGNOSIS — F332 Major depressive disorder, recurrent severe without psychotic features: Secondary | ICD-10-CM

## 2015-11-16 NOTE — Progress Notes (Signed)
Pt reported to [facility] for Repetitive Transcranial Magnetic Stimulation treatment for Major Depressive Disorder. Pt presented with pleasant affect. Pt reported no change in alcohol/substance use, caffeine consumption, sleep pattern or metal implant status since previous tx..Pt with no complaints post-tx. Pt left clinic with no problems or issues.

## 2015-11-17 ENCOUNTER — Other Ambulatory Visit (INDEPENDENT_AMBULATORY_CARE_PROVIDER_SITE_OTHER): Payer: Medicare Other | Admitting: Emergency Medicine

## 2015-11-17 DIAGNOSIS — F332 Major depressive disorder, recurrent severe without psychotic features: Secondary | ICD-10-CM | POA: Diagnosis not present

## 2015-11-17 NOTE — Progress Notes (Signed)
Pt reported to [facility] for Repetitive Transcranial Magnetic Stimulation treatment for Major Depressive Disorder. Pt presented with pleasant affect. Pt reported no change in alcohol/substance use, caffeine consumption, sleep pattern or metal implant status since previous tx..Pt with no complaints post-tx. Pt felt nauseous after treatment. Pt sat in lobby afterwards and left after she was feeling better.

## 2015-11-18 ENCOUNTER — Encounter (HOSPITAL_COMMUNITY): Payer: Self-pay | Admitting: Emergency Medicine

## 2015-11-19 ENCOUNTER — Encounter (HOSPITAL_COMMUNITY): Payer: Self-pay

## 2015-11-21 ENCOUNTER — Other Ambulatory Visit: Payer: Self-pay | Admitting: Internal Medicine

## 2015-11-21 NOTE — Progress Notes (Signed)
Pt called inpatient [facility] unit to say that she won't be able to make her [place] appt tomorrow. She says she was on the phone with a good friend yesterday when the friend committed suicide with pt listening. Pt denies SI currently and states she feels safe. Pt also said she called [place] office directly to cancel appointment. Writer practiced brief supportive therapy and reminded pt that she is not to blame in any way for the friend's suicide.  Arnold Long, [place] [facility]rapeutic Triage Specialist

## 2015-11-22 ENCOUNTER — Other Ambulatory Visit (HOSPITAL_COMMUNITY): Payer: Medicare Other | Attending: Psychiatry | Admitting: Emergency Medicine

## 2015-11-22 DIAGNOSIS — F332 Major depressive disorder, recurrent severe without psychotic features: Secondary | ICD-10-CM | POA: Diagnosis present

## 2015-11-22 NOTE — Progress Notes (Signed)
Pt reported to [facility] for Repetitive Transcranial Magnetic Stimulation treatment for Major Depressive Disorder. Pt presented with pleasant affect. Pt reported that she lost a dear friend - he had committed suicide. Pt originally canceled her appointment. However, she ended up showing up and reported that she feels better and not blaming herself for the death. Pt contracted for safety and displayed no suicidal ideations.Pt with no complaints post-tx. Pt left clinic with no problems or issues.

## 2015-11-23 ENCOUNTER — Encounter (HOSPITAL_COMMUNITY): Payer: Self-pay

## 2015-11-23 ENCOUNTER — Ambulatory Visit (HOSPITAL_COMMUNITY): Payer: Self-pay | Admitting: Clinical

## 2015-11-23 ENCOUNTER — Other Ambulatory Visit (INDEPENDENT_AMBULATORY_CARE_PROVIDER_SITE_OTHER): Payer: Medicare Other

## 2015-11-23 DIAGNOSIS — F332 Major depressive disorder, recurrent severe without psychotic features: Secondary | ICD-10-CM

## 2015-11-23 NOTE — Progress Notes (Signed)
Patient ID: Jordan [name], female   DOB: 02/27/1960, 55 y.o.   MRN: [id number] Pt reported to [facility] for Repetitive Transcranial Magnetic Stimulation treatment for Major Depressive Disorder. Pt presented with pleasant affect. Pt reported no change in alcohol/substance use, caffeine consumption, sleep pattern or metal implant status since previous tx. Power kept at 120% for the duration of tx.Pt with no complaints post-tx. Pt left clinic with no problems or issues.

## 2015-11-24 ENCOUNTER — Encounter (HOSPITAL_COMMUNITY): Payer: Self-pay

## 2015-11-24 ENCOUNTER — Other Ambulatory Visit (INDEPENDENT_AMBULATORY_CARE_PROVIDER_SITE_OTHER): Payer: Medicare Other

## 2015-11-24 DIAGNOSIS — F332 Major depressive disorder, recurrent severe without psychotic features: Secondary | ICD-10-CM

## 2015-11-24 NOTE — Progress Notes (Signed)
Patient ID: Jordan [name], female   DOB: [birth year], 55 y.o.   MRN: [id number] Pt reported to [facility] for Repetitive Transcranial Magnetic Stimulation treatment for Major Depressive Disorder. Pt presented with pleasant affect. Pt reported no change in alcohol/substance use, caffeine consumption, sleep pattern or metal implant status since previous tx.Pt watched television and listened to music during tx. Pt completed PHQ-9, totaling 5. Pt with no complaints post-tx.

## 2015-11-25 ENCOUNTER — Encounter (HOSPITAL_COMMUNITY): Payer: Self-pay

## 2015-11-26 ENCOUNTER — Other Ambulatory Visit (INDEPENDENT_AMBULATORY_CARE_PROVIDER_SITE_OTHER): Payer: Medicare Other

## 2015-11-26 DIAGNOSIS — F332 Major depressive disorder, recurrent severe without psychotic features: Secondary | ICD-10-CM | POA: Diagnosis not present

## 2015-11-26 NOTE — Progress Notes (Signed)
Patient ID: Jordan [name], female   DOB: 11-May-1960, 55 y.o.   MRN: [id number] Pt reported to [facility] for Repetitive Transcranial Magnetic Stimulation treatment for Major Depressive Disorder. Pt presented with pleasant affect. Pt reported no change in alcohol/substance use, caffeine consumption, sleep pattern or metal implant status since previous tx.Pt watched television and listened to music during tx. Pt stated that she has had a rough week but still feels that the [place] treatments are working for her. Pt with no complaints post-tx.

## 2015-11-29 ENCOUNTER — Other Ambulatory Visit (INDEPENDENT_AMBULATORY_CARE_PROVIDER_SITE_OTHER): Payer: Medicare Other | Admitting: Emergency Medicine

## 2015-11-29 DIAGNOSIS — F332 Major depressive disorder, recurrent severe without psychotic features: Secondary | ICD-10-CM | POA: Diagnosis not present

## 2015-11-29 NOTE — Progress Notes (Signed)
Pt reported to [facility] for Repetitive Transcranial Magnetic Stimulation treatment for Major Depressive Disorder. Pt presented with pleasant affect. Pt reported no change in alcohol/substance use, caffeine consumption, sleep pattern or metal implant status since previous tx.Pt stated that she is doing better from last week. Pt was reassured and motivated. Pt stated that she will going to buy a 55 year old a gift and excited about it. Pt had  no complaints post-tx.

## 2015-11-30 ENCOUNTER — Other Ambulatory Visit (INDEPENDENT_AMBULATORY_CARE_PROVIDER_SITE_OTHER): Payer: Medicare Other

## 2015-11-30 ENCOUNTER — Encounter (HOSPITAL_COMMUNITY): Payer: Self-pay

## 2015-11-30 DIAGNOSIS — F332 Major depressive disorder, recurrent severe without psychotic features: Secondary | ICD-10-CM

## 2015-11-30 NOTE — Progress Notes (Signed)
Patient ID: Jordan [name], female   DOB: 07/27/1960, 55 y.o.   MRN: [id number] Pt reported to [facility] for Repetitive Transcranial Magnetic Stimulation treatment for Major Depressive Disorder. Pt presented with pleasant affect. Pt reported no change in alcohol/substance use, caffeine consumption, sleep pattern or metal implant status since previous tx.Pt watched television and listened to music during tx. Pt stated that she has had a rough week and feels like she just need to cry and get her organization back on track. Stated that she is taking the steps to do better.Pt with no complaints post-tx.

## 2015-12-01 ENCOUNTER — Encounter (HOSPITAL_COMMUNITY): Payer: Self-pay | Admitting: Emergency Medicine

## 2015-12-02 ENCOUNTER — Encounter (HOSPITAL_COMMUNITY): Payer: Self-pay

## 2015-12-02 NOTE — Progress Notes (Signed)
Pt stated that she does not want to continue [place], Pt called to notify [place] coordinators. Also, called to notify Dr. Lovena Le. Pt stated that she is still hurting from the pulses and feeling nauseous. She stated that she was still dealing with her friends death. Pt contracted for safety. No suicidal or homicidal ideation.

## 2015-12-03 ENCOUNTER — Encounter (HOSPITAL_COMMUNITY): Payer: Self-pay

## 2015-12-06 ENCOUNTER — Encounter (HOSPITAL_COMMUNITY): Payer: Self-pay

## 2015-12-07 ENCOUNTER — Encounter (HOSPITAL_COMMUNITY): Payer: Self-pay

## 2015-12-08 ENCOUNTER — Encounter (HOSPITAL_COMMUNITY): Payer: Self-pay

## 2015-12-13 ENCOUNTER — Encounter (HOSPITAL_COMMUNITY): Payer: Self-pay

## 2015-12-14 ENCOUNTER — Encounter (HOSPITAL_COMMUNITY): Payer: Self-pay

## 2015-12-21 ENCOUNTER — Encounter (HOSPITAL_COMMUNITY): Payer: Self-pay

## 2015-12-21 ENCOUNTER — Ambulatory Visit (HOSPITAL_COMMUNITY): Payer: Self-pay

## 2015-12-28 ENCOUNTER — Ambulatory Visit (INDEPENDENT_AMBULATORY_CARE_PROVIDER_SITE_OTHER): Payer: Medicare Other | Admitting: Clinical

## 2015-12-28 ENCOUNTER — Telehealth (HOSPITAL_COMMUNITY): Payer: Self-pay

## 2015-12-28 DIAGNOSIS — F411 Generalized anxiety disorder: Secondary | ICD-10-CM | POA: Diagnosis not present

## 2015-12-28 DIAGNOSIS — F332 Major depressive disorder, recurrent severe without psychotic features: Secondary | ICD-10-CM

## 2015-12-28 DIAGNOSIS — F4310 Post-traumatic stress disorder, unspecified: Secondary | ICD-10-CM | POA: Diagnosis not present
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
## 2015-12-28 NOTE — Telephone Encounter (Signed)
Patient came by my office after her appointment with Tharon Aquas, she said she needed to see Dr. Lovena Le and had never been given a follow up. I advised patient that she was supposed to be here on Oct. 31 - she apologized and asked for another appointment. Velva Harman told me to get in touch with you for the appointment. Would you mind rescheduling her and calling her to let her know? Thank you

## 2015-12-29 ENCOUNTER — Encounter (HOSPITAL_COMMUNITY): Payer: Self-pay | Admitting: Clinical

## 2016-01-06 ENCOUNTER — Telehealth: Payer: Self-pay | Admitting: Neurology

## 2016-01-06 NOTE — Telephone Encounter (Signed)
Okay to set this patient up with another physician.

## 2016-01-07 NOTE — Telephone Encounter (Signed)
LMTC./fim 

## 2016-01-07 NOTE — Telephone Encounter (Signed)
Please let patient know, I have reviewed extensive evaluation including Dr. Jannifer Franklin notes in our system,  Mild small vessel disease on MRI of the brain 2015, Laboratory evaluations showed no significant abnormality,  Dr. Jannifer Franklin has referred her to outside neurology clinic,  I do not think I have anything further to offer, if she wished, we may refer her to academic tertiary center.

## 2016-01-10 NOTE — Telephone Encounter (Signed)
Left mssg for pt to call back if she would like referral to academic tertiary center.

## 2016-01-11 NOTE — Telephone Encounter (Signed)
Called and spoke to patient. Says that she only needs referral to [facility] for NCS due to carpal tunnel symptoms. Is fine w/ Dr. Jannifer Franklin or Dr. Krista Blue doing the procedure if either provider is willing. Plans on follow-up w/ her PCP, Dr. Jonni Sanger.

## 2016-01-11 NOTE — Telephone Encounter (Signed)
I called patient. It would be fine for any of the doctors here to do the EMG and nerve conduction study evaluation for this patient.

## 2016-01-11 NOTE — Telephone Encounter (Addendum)
Patient returned Jennifer's call, skyped Anderson Malta who is currently with a patient, advised patient Anderson Malta will call back when not with a patient, patient then asked to speak to Glass blower/designer, states she had requested to switch doctor's, "Dr. Jannifer Franklin and I don't get along". Please call [phone].

## 2016-01-17 NOTE — Telephone Encounter (Signed)
Ok for emg. -VRP

## 2016-01-25 ENCOUNTER — Ambulatory Visit (INDEPENDENT_AMBULATORY_CARE_PROVIDER_SITE_OTHER): Payer: Medicare Other | Admitting: Clinical

## 2016-01-25 DIAGNOSIS — F411 Generalized anxiety disorder: Secondary | ICD-10-CM | POA: Diagnosis not present

## 2016-01-25 DIAGNOSIS — F4310 Post-traumatic stress disorder, unspecified: Secondary | ICD-10-CM

## 2016-01-25 DIAGNOSIS — F332 Major depressive disorder, recurrent severe without psychotic features: Secondary | ICD-10-CM | POA: Diagnosis not present

## 2016-01-25 NOTE — Progress Notes (Signed)
   THERAPIST PROGRESS NOTE  Session Time: 3:23 - 4:25   Participation Level: Active  Behavioral Response: CasualAlertDepressed  Type of Therapy: Individual Therapy  Type of Therapy: Individual Therapy  Treatment Goals addressed: Improve Psychiatric Symptoms, Elevate mood, Improve unhelpful thought patterns, Stress management,  healthy coping skills  Interventions: Motivational Interviewing, CBT, psychoeducation, Grounding & Mindfulness Techniques,   Summary: Jordan [name] is [name] 86-y.o. female who presents with Major depressive disorder, recurrent, severe without psychotic features, PTSD, and generalized anxiety disorder  Suicidal/Homicidal: No -without intent/plan  Therapist Response: Jordan [name] met with clinician for an individual session. [name] discussed her psychiatric symptoms, her current life events and her goals for therapy. Jordan [name] shared that she has been feeling depressed. Clinician introduced grounding and mindfulness techniques. Clinican explained the process, purpose and practice of each. Client and clinician practiced the techniques together. Jordan [name] asked clarifying questions which clinician answered. Clinician introduced some basic cbt concepts. Client and clinician discussed the thought emotion connection and focus for future sessions. Clinician gave [name] homework packet on depression which [name] agreed to complete and bring beck with her to next session.   Plan: Return again in 1- 2 week  Diagnosis:     Axis I: Major depressive disorder, recurrent, severe without psychotic features, PTSD, and generalized anxiety disorder    Jordan Macbeth A, LCSW 01/25/2016

## 2016-01-31 ENCOUNTER — Encounter (HOSPITAL_COMMUNITY): Payer: Self-pay | Admitting: Clinical

## 2016-02-01 ENCOUNTER — Ambulatory Visit (INDEPENDENT_AMBULATORY_CARE_PROVIDER_SITE_OTHER): Payer: Medicare Other | Admitting: Clinical

## 2016-02-01 DIAGNOSIS — F411 Generalized anxiety disorder: Secondary | ICD-10-CM | POA: Diagnosis not present

## 2016-02-01 DIAGNOSIS — F4310 Post-traumatic stress disorder, unspecified: Secondary | ICD-10-CM

## 2016-02-01 DIAGNOSIS — F332 Major depressive disorder, recurrent severe without psychotic features: Secondary | ICD-10-CM

## 2016-02-01 NOTE — Progress Notes (Signed)
   THERAPIST PROGRESS NOTE  Session Time: 2:30 - 3:25  Participation Level: Active  Behavioral Response: CasualAlertDepressed  Type of Therapy: Individual Therapy  Treatment Goals addressed: Improve Psychiatric Symptoms, Elevate mood , Improve unhelpful thought patterns, interpersonal relationship skills ( active listening), Stress management,  learn about diagnosis, healthy coping skills  Interventions: Motivational Interviewing, CBT, psychoeducation,   Summary: Jordan [name] is a 45-y.o. female who presents with Major depressive disorder, recurrent, severe without psychotic features, PTSD, and generalized anxiety disorder  Suicidal/Homicidal: No -without intent/plan  Therapist Response: Jordan [name] met with clinician for an individual session. Jordan [name] discussed her psychiatric symptoms, her current life events and her homework. Jordan [name] shared that she has been depressed. She shared that she had completed her homework. Client and clinician reviewed and discussed her homework. She shared that she has been practicing her grounding and mindfulness techniques. Client and clinician discussed her negative thoughts and emotions. Jordan [name] presented some of the negative thoughts that she has been experiencing this past week. Clinician introduced a seven panel thought record sheet. Clinician explained the purpose. Clinician asked open ended questions and Jordan [name] filled out the worksheet. Jordan [name] identified her triggering event, emotions, negative thoughts, evidence for and against the thoughts, and then healthier alternative thoughts. Client and clinician discussed her thoughts and insights. Clinician gave her packet #3 on depression which she agreed to complete and bring back with her to next session as well as complete three seven panel thought record sheets, and continue to practice her grounding and mindfulness techniques.   Plan: Return again in 1- 2 week  Diagnosis:     Axis I: Major depressive disorder,  recurrent, severe without psychotic features, PTSD, and generalized anxiety disorder   Kaizen Ibsen A, LCSW 02/01/2016

## 2016-02-07 ENCOUNTER — Encounter (HOSPITAL_COMMUNITY): Payer: Self-pay | Admitting: Clinical

## 2016-02-08 ENCOUNTER — Ambulatory Visit (HOSPITAL_COMMUNITY): Payer: Self-pay | Admitting: Clinical

## 2016-02-21 HISTORY — PX: REDUCTION MAMMAPLASTY: SUR839

## 2016-02-22 ENCOUNTER — Ambulatory Visit (INDEPENDENT_AMBULATORY_CARE_PROVIDER_SITE_OTHER): Payer: Medicare Other | Admitting: Clinical

## 2016-02-22 DIAGNOSIS — F411 Generalized anxiety disorder: Secondary | ICD-10-CM

## 2016-02-22 DIAGNOSIS — F332 Major depressive disorder, recurrent severe without psychotic features: Secondary | ICD-10-CM | POA: Diagnosis not present

## 2016-02-22 DIAGNOSIS — F4310 Post-traumatic stress disorder, unspecified: Secondary | ICD-10-CM | POA: Diagnosis not present

## 2016-02-22 NOTE — Progress Notes (Signed)
   THERAPIST PROGRESS NOTE  Session Time: 2:37- 3:33  Participation Level: Active  Behavioral Response: CasualAlertDepressed  Type of Therapy: Individual Therapy  Treatment Goals addressed: Improve Psychiatric Symptoms, Elevate mood , Improve unhelpful thought patterns, interpersonal relationship skills , Stress management,  learn about diagnosis, healthy coping skills  Interventions: Motivational Interviewing, CBT,  Summary: Jordan [name] is a 39-y.o. female who presents with Major depressive disorder, recurrent, severe without psychotic features, PTSD, and generalized anxiety disorder  Suicidal/Homicidal: No -without intent/plan  Therapist Response: [name] met with clinician for an individual session. [name] discussed her psychiatric symptoms, her current life events and her homework. [name] shared that she has been depressed. She shared that she had passive suicidal thoughts over the holiday. [name] denied any current suicidal or homicidal ideation.  Clinician asked open ended questions and [name] shared her negative thoughts at the time. Clinician asked open ended questions and [name] and [name] identified her emotions and negative thoughts, Clinician asked open ended questions and [name] identified the evidence for and against the beliefs. [name] was then able to formulate healthier alternative thoughts. Client and clinician discussed healthy interpersonal relationship skills.  Client and clinician discussed [name] insights. She shared that she believes she is hanging on to past resentments in order to block her own progress. Clinician asked open ended questions and [name] explored this belief. [name] and clinician reviewed her homework packet #3 on depression. She did not complete the packet but agreed to before next session in addition to packet #4   Plan: Return again in 1- 2 week  Diagnosis:     Axis I: Major depressive disorder, recurrent, severe without psychotic features, PTSD, and  generalized anxiety disorder    Anelis Hrivnak A, LCSW 02/22/2016

## 2016-02-23 ENCOUNTER — Encounter (HOSPITAL_COMMUNITY): Payer: Self-pay | Admitting: Clinical

## 2016-02-24 ENCOUNTER — Other Ambulatory Visit (HOSPITAL_COMMUNITY): Payer: Medicare Other | Attending: Psychiatry | Admitting: Psychiatry

## 2016-02-24 DIAGNOSIS — F332 Major depressive disorder, recurrent severe without psychotic features: Secondary | ICD-10-CM

## 2016-02-24 NOTE — Progress Notes (Signed)
[place] Follow up   Ms Middleman says the [place] was initially helpful but she was having pain in her right ear which made her dread coming.  She is doing better than before she started but not as well as she could.  She has a job and participates in some community activities and has a dog she loves.  We talked about ketamine therapy and she may try that she says.  ECT was also reviewed but for now she is not interested in that.

## 2016-02-29 ENCOUNTER — Ambulatory Visit (HOSPITAL_COMMUNITY): Payer: Self-pay | Admitting: Clinical

## 2016-03-07 ENCOUNTER — Ambulatory Visit (HOSPITAL_COMMUNITY): Payer: Self-pay | Admitting: Clinical

## 2016-03-09 ENCOUNTER — Encounter: Payer: Medicare Other | Admitting: Diagnostic Neuroimaging

## 2016-03-13 ENCOUNTER — Telehealth (HOSPITAL_COMMUNITY): Payer: Self-pay | Admitting: Clinical
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
## 2016-04-20 ENCOUNTER — Ambulatory Visit (INDEPENDENT_AMBULATORY_CARE_PROVIDER_SITE_OTHER): Payer: Medicare Other | Admitting: Physician Assistant

## 2016-04-20 ENCOUNTER — Encounter: Payer: Self-pay | Admitting: Physician Assistant

## 2016-04-20 VITALS — BP 120/70 | HR 78 | Ht 62.0 in | Wt 142.0 lb

## 2016-04-20 DIAGNOSIS — R112 Nausea with vomiting, unspecified: Secondary | ICD-10-CM | POA: Diagnosis not present

## 2016-04-20 DIAGNOSIS — K219 Gastro-esophageal reflux disease without esophagitis: Secondary | ICD-10-CM | POA: Diagnosis not present

## 2016-04-20 MED ORDER — ONDANSETRON HCL 4 MG PO TABS: ORAL_TABLET | ORAL | 1 refills | Status: DC

## 2016-04-20 MED ORDER — ESOMEPRAZOLE MAGNESIUM 40 MG PO CPDR: 40.0000 mg | DELAYED_RELEASE_CAPSULE | Freq: Every day | ORAL | 6 refills | Status: DC

## 2016-04-20 NOTE — Patient Instructions (Signed)
We have sent the following medications to your pharmacy for you to pick up at your convenience:  Esomeprazole, Zofran  Discontinue Pantoprazole  You have been scheduled for an endoscopy. Please follow written instructions given to you at your visit today. If you use inhalers (even only as needed), please bring them with you on the day of your procedure. Your physician has requested that you go to www.startemmi.com and enter the access code given to you at your visit today. This web site gives a general overview about your procedure. However, you should still follow specific instructions given to you by our office regarding your preparation for the procedure.

## 2016-04-20 NOTE — Progress Notes (Signed)
Agree with assessment and plan as outlined.  

## 2016-04-20 NOTE — Progress Notes (Signed)
Chief Complaint: GERD, Nausea and vomiting  HPI:  Jordan [name] is a 56 year old Caucasian female with a past medical history of anxiety, depressive disorder, GERD, fibromyalgia and multiple others listed below, who was referred to me by Leamon Arnt, MD for a complaint of reflux, nausea and vomiting .     Patient has previously followed in the clinic with Dr. Deatra Ina for screening colonoscopies.   Today, the patient presents to clinic and tells me that over the past year she has had an increase in reflux symptoms. She tells me she has had reflux for "as long as I can remember", she believes this was diagnosed in college via EGD, but has had no imaging or procedure since that time. She was initially on omeprazole/Prilosec, but was changed to pantoprazole 40 mg twice a day over the past couple of years, this has started not to help as much. Patient admits that 80% of the time she will wake up nauseous in the morning and have an episode of vomiting after she eats breakfast. She tells me she also has nighttime symptoms which occur right when she lays down in bed, regardless of elevating her head with multiple pillows. She also sometimes awakes in the middle of the night with reflux. Patient tells me she has tried altering her diet to see if this will help with her reflux symptoms and occasionally she can eat fruit in the morning with no vomiting.   Patient's social history is positive for eating late at night as well as having a glass of wine, patient has been trying to stop this but does admit that she occasionally still does this and knows it makes her symptoms worse.   Patient denies fever, chills, blood in her stool, melena, change in bowel habits, weight loss, fatigue, anorexia, dysphagia or abdominal pain.  Past Medical History:  Diagnosis Date  . Acute upper respiratory infections of unspecified site   . ADD (attention deficit disorder)   . Allergic rhinitis due to pollen   . Anal fissure   .  Anemia, unspecified   . Anxiety disorder   . Asthma   . Chicken pox   . Complication of anesthesia    wakes up slow  . Depressive disorder, not elsewhere classified   . Diffuse cystic mastopathy   . Falls   . Fibromyalgia   . GERD (gastroesophageal reflux disease)   . Headache(784.0)   . History of anorexia nervosa   . History of bulimia   . IBS (irritable bowel syndrome)   . 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 Lump or mass in breast   . Meningitis, unspecified(322.9)   . Other specified glaucoma   . Perforation of tympanic membrane, unspecified   . Radial styloid tenosynovitis   . Seizures ([place])     Past Surgical History:  Procedure Laterality Date  . ADENOIDECTOMY    . ANKLE SURGERY  1992   right ankle - scar tissue  . DILATION AND CURETTAGE OF UTERUS  2006   History of Menometrorrhagia  . [place] to 1986   6 surgeries on right ear  . LAPAROSCOPY  1993   normal  . uterine ablation  2011  . WRIST SURGERY  2001   for de-quervaine - bilateral wrists - tendonitis    Current Outpatient Prescriptions  Medication Sig Dispense Refill  . ALPRAZolam (XANAX) 0.5 MG tablet Take 0.5 mg by mouth at bedtime as needed. For sleep.  Also 1/2 tab  during the day.    . amphetamine-dextroamphetamine (ADDERALL XR) 30 MG 24 hr capsule Take 30 mg by mouth daily.    Marland Kitchen amphetamine-dextroamphetamine (ADDERALL) 30 MG tablet Take 30 mg by mouth 3 (three) times daily.     . Brexpiprazole (REXULTI) 1 MG TABS Take 0.5 tablets by mouth at bedtime.    . cyclobenzaprine (FLEXERIL) 10 MG tablet Take 10 mg by mouth at bedtime as needed.     . DULoxetine (CYMBALTA) 60 MG capsule Take by mouth 2 (two) times daily.     . fluticasone (FLONASE) 50 MCG/ACT nasal spray Place 1 spray into both nostrils daily as needed for allergies or rhinitis.    Marland Kitchen traZODone (DESYREL) 100 MG tablet Take 100 mg by mouth at bedtime.    Marland Kitchen esomeprazole (NEXIUM) 40 MG capsule Take 1 capsule (40 mg total) by mouth daily  before breakfast. 30 capsule 6  . ondansetron (ZOFRAN) 4 MG tablet Take one tab every 4-6 hours as needed for nausea 30 tablet 1   No current facility-administered medications for this visit.     Allergies as of 04/20/2016 - Review Complete 04/20/2016  Allergen Reaction Noted  . Mirtazapine Other (See Comments) 04/02/2015    Family History  Problem Relation Age of Onset  . Ovarian cancer Mother   . Heart disease Mother   . Hypertension Mother   . Coronary artery disease Mother   . Colon polyps Mother 72  . Heart failure Father   . Hypertension Father   . Hyperlipidemia Father   . Diabetes Father   . Breast cancer Maternal Grandmother   . Colon cancer Neg Hx   . Esophageal cancer Neg Hx   . Stomach cancer Neg Hx   . Rectal cancer Neg Hx     Social History   Social History  . Marital status: Divorced    Spouse name: N/A  . Number of children: 0  . Years of education: Masters   Occupational History  . part time Designer, industrial/product    Social History Main Topics  . Smoking status: Never Smoker  . Smokeless tobacco: Never Used  . Alcohol use 8.4 oz/week    14 Glasses of wine per week     Comment: 2 glasses of wine/Day  . Drug use: No  . Sexual activity: Not on file   Other Topics Concern  . Not on file   Social History Narrative   [employer], [employer] BA, App for MA. [place], [place] land. 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 Married 5 years - divorced, no children   Patient is right handed   Patient drinks 1 cup of coffee daily.                   Review of Systems:    Constitutional: No weight loss, fever or chills Skin: No rash  Cardiovascular: No chest pain   Respiratory: No SOB Gastrointestinal: See HPI and otherwise negative Genitourinary: No dysuria or change in urinary frequency Neurological: No headache Musculoskeletal: No new muscle or joint pain Hematologic: No bleeding  Psychiatric: Positive  for history of depression and anxiety   Physical Exam:  Vital signs: BP 120/70   Pulse 78   Ht 5\' 2"  (1.575 m)   Wt 142 lb (64.4 kg)   BMI 25.97 kg/m   Constitutional:   Pleasant Caucasian female appears to be in NAD, Well developed, Well nourished,  alert and cooperative Head:  Normocephalic and atraumatic. Eyes:   PEERL, EOMI. No icterus. Conjunctiva pink. Ears:  Normal auditory acuity. Neck:  Supple Throat: Oral cavity and pharynx without inflammation, swelling or lesion.  Respiratory: Respirations even and unlabored. Lungs clear to auscultation bilaterally.   No wheezes, crackles, or rhonchi.  Cardiovascular: Normal S1, S2. No MRG. Regular rate and rhythm. No peripheral edema, cyanosis or pallor.  Gastrointestinal:  Soft, nondistended, mild epigastric ttp No rebound or guarding. Normal bowel sounds. No appreciable masses or hepatomegaly. Rectal:  Not performed.  Msk:  Symmetrical without gross deformities. Without edema, no deformity or joint abnormality.  Neurologic:  Alert and  oriented x4;  grossly normal neurologically.  Skin:   Dry and intact without significant lesions or rashes. Psychiatric: Demonstrates good judgement and reason without abnormal affect or behaviors.  No recent labs or imaging.  Assessment: 1. GERD: Diagnosed over 30 years ago, was maintained on omeprazole, now on pantoprazole 40 mg twice a day with continued reflux symptoms as well as nausea and vomiting worse in the morning; Consider H. pylori versus gastritis versus PUD versus other 2. Nausea and vomiting: Likely related to above  Plan: 1. Changed patient from Pantoprazole 40 mg twice a day to Esomeprazole/Nexium 40 mg twice a day, 30-60 minutes before eating breakfast and dinner 2. Reviewed antireflux diet and lifestyle modifications. Did discuss with the patient that she should try to stop eating 3-4 hours before lying down at night. We also discussed that she could elevate the head of her bed to a  45 angle 3. Scheduled patient for an EGD for further evaluation. Discussed risks, benefits, limitations and alternatives the patient agrees to proceed. This was scheduled with [facility]. Havery Mor[facility] in [facility] as he is the supervising physician this afternoon. 4. Prescribed Zofran 4 mg every 4-6 hours as needed for nausea #30 with 1 refill 5. Patient to follow in clinic per Dr. Doyne Keel recommendations after time of procedure.  Ellouise Newer, PA-C [facility] 04/20/2016, 3:02 PM  Cc: Leamon Arnt, MD

## 2016-05-05 ENCOUNTER — Institutional Professional Consult (permissible substitution): Payer: Self-pay | Admitting: Psychiatry

## 2016-05-09 ENCOUNTER — Ambulatory Visit: Payer: Self-pay | Admitting: Nurse Practitioner

## 2016-05-24 ENCOUNTER — Encounter: Payer: Self-pay | Admitting: Gastroenterology

## 2016-05-24 ENCOUNTER — Ambulatory Visit (AMBULATORY_SURGERY_CENTER): Payer: Medicare Other | Admitting: Gastroenterology

## 2016-05-24 VITALS — BP 103/78 | HR 78 | Temp 98.4°F | Resp 14 | Ht 62.0 in | Wt 142.0 lb

## 2016-05-24 DIAGNOSIS — K219 Gastro-esophageal reflux disease without esophagitis: Secondary | ICD-10-CM | POA: Diagnosis not present

## 2016-05-24 MED ORDER — SODIUM CHLORIDE 0.9 % IV SOLN: 500.0000 mL | INTRAVENOUS | Status: DC

## 2016-05-24 NOTE — Op Note (Signed)
[facility] Patient Name: Jordan [name] Procedure Date: 05/24/2016 9:34 AM MRN: [id number] Endoscopist: Remo Lipps P. Armbruster MD, MD Age: 56 Referring MD:  Date of Birth: [birth year] Gender: Female Account #: [id number] Procedure:                Upper GI endoscopy Indications:              Heartburn, Nausea with vomiting despite high dose                            PPI Medicines:                Monitored Anesthesia Care Procedure:                Pre-Anesthesia Assessment:                           - Prior to the procedure, a History and Physical                            was performed, and patient medications and                            allergies were reviewed. The patient's tolerance of                            previous anesthesia was also reviewed. The risks                            and benefits of the procedure and the sedation                            options and risks were discussed with the patient.                            All questions were answered, and informed consent                            was obtained. Prior Anticoagulants: The patient has                            taken no previous anticoagulant or antiplatelet                            agents. ASA Grade Assessment: II - A patient with                            mild systemic disease. After reviewing the risks                            and benefits, the patient was deemed in                            satisfactory condition to undergo the procedure.  After obtaining informed consent, the endoscope was                            passed under direct vision. Throughout the                            procedure, the patient's blood pressure, pulse, and                            oxygen saturations were monitored continuously. The                            Endoscope was introduced through the mouth, and                            advanced to the second part of duodenum. The  upper                            GI endoscopy was accomplished without difficulty.                            The patient tolerated the procedure well. Scope In: Scope Out: Findings:                 Esophagogastric landmarks were identified: the                            Z-line was found at 34 cm, the gastroesophageal                            junction was found at 34 cm and the upper extent of                            the gastric folds was found at 38 cm from the                            incisors.                           A 4 cm hiatal hernia was present.                           A single flat 3 mm polyp was found 30 cm from the                            incisors. The polyp was removed with a cold biopsy                            forceps. Resection and retrieval were complete.                           The exam of the esophagus was otherwise normal.  The entire examined stomach was normal. Biopsies                            were taken with a cold forceps for Helicobacter                            pylori testing given the patient's symptoms of                            nausea / vomiting.                           The duodenal bulb and second portion of the                            duodenum were normal. Complications:            No immediate complications. Estimated blood loss:                            Minimal. Estimated Blood Loss:     Estimated blood loss was minimal. Impression:               - Esophagogastric landmarks identified.                           - 4 cm hiatal hernia.                           - Benign esophageal polyp was found. Resected and                            retrieved.                           - No esophagitis or evidence of Barrett's esophagus                           - Normal stomach. Biopsied.                           - Normal duodenal bulb and second portion of the                             duodenum. Recommendation:           - Patient has a contact number available for                            emergencies. The signs and symptoms of potential                            delayed complications were discussed with the                            patient. Return to normal activities tomorrow.  Written discharge instructions were provided to the                            patient.                           - Resume previous diet.                           - Continue present medications.                           - Await pathology results.                           - Consideration for 24 HR pH impedance testing and                            manometry if symptoms persist and biopsies                            unremarkable Remo Lipps P. Armbruster MD, MD 05/24/2016 9:53:42 AM This report has been signed electronically.

## 2016-05-24 NOTE — Progress Notes (Signed)
Report to PACU, RN, vss, BBS= Clear.  

## 2016-05-24 NOTE — Progress Notes (Signed)
Called to room to assist during endoscopic procedure.  Patient ID and intended procedure confirmed with present staff. Received instructions for my participation in the procedure from the performing physician.  

## 2016-05-24 NOTE — Patient Instructions (Signed)
Handout given on Hiatal Hernia/ GERD  YOU HAD AN ENDOSCOPIC PROCEDURE TODAY: Refer to the procedure report and other information in the discharge instructions given to you for any specific questions about what was found during the examination. If this information does not answer your questions, please call [facility] office at [phone] to clarify.   YOU SHOULD EXPECT: Some feelings of bloating in the abdomen. Passage of more gas than usual. Walking can help get rid of the air that was put into your GI tract during the procedure and reduce the bloating. If you had a lower endoscopy (such as a colonoscopy or flexible sigmoidoscopy) you may notice spotting of blood in your stool or on the toilet paper. Some abdominal soreness may be present for a day or two, also.  DIET: Your first meal following the procedure should be a light meal and then it is ok to progress to your normal diet. A half-sandwich or bowl of soup is an example of a good first meal. Heavy or fried foods are harder to digest and may make you feel nauseous or bloated. Drink plenty of fluids but you should avoid alcoholic beverages for 24 hours. If you had a esophageal dilation, please see attached instructions for diet.    ACTIVITY: Your care partner should take you home directly after the procedure. You should plan to take it easy, moving slowly for the rest of the day. You can resume normal activity the day after the procedure however YOU SHOULD NOT DRIVE, use power tools, machinery or perform tasks that involve climbing or major physical exertion for 24 hours (because of the sedation medicines used during the test).   SYMPTOMS TO REPORT IMMEDIATELY: A gastroenterologist can be reached at any hour. Please call [phone]  for any of the following symptoms:   Following upper endoscopy (EGD, EUS, ERCP, esophageal dilation) Vomiting of blood or coffee ground material  New, significant abdominal pain  New, significant chest pain or pain  under the shoulder blades  Painful or persistently difficult swallowing  New shortness of breath  Black, tarry-looking or red, bloody stools  FOLLOW UP:  If any biopsies were taken you will be contacted by phone or by letter within the next 1-3 weeks. Call [phone]  if you have not heard about the biopsies in 3 weeks.  Please also call with any specific questions about appointments or follow up tests.

## 2016-05-24 NOTE — Progress Notes (Addendum)
Patient came to recovery with an irritated left eye. She states that she deals with dry eyes normally and that she may have fallen asleep with that eye partially open. I discussed this with Dr.Armbruster and the CRNA and was told I could add a little saline into her eye. After doing this a couple of times she said it felt better and was good to go.

## 2016-05-25 ENCOUNTER — Telehealth: Payer: Self-pay

## 2016-05-25 ENCOUNTER — Telehealth: Payer: Self-pay | Admitting: *Deleted

## 2016-05-25 NOTE — Telephone Encounter (Signed)
Name identifier, left message we will call back later today.

## 2016-05-25 NOTE — Telephone Encounter (Signed)
Discussed patient's complaints of abdominal discomfort with Dr. Wilma Flavin. Dr. Wilma Flavin stating more likely musculoskeletal, unrelated to endoscopic procedure. Called patient to inform of Dr Armbruester's thoughts. Patient stating maybe she "tensed up" during the procedure. Reassured the patient that this discomfort is unrelated to actual procedure or biopsies. Discussed lifting of laundry basket.Patient stating she needs to get more in shape. Patient stating her 56 year old Godfather does her grocery shopping for her.

## 2016-05-25 NOTE — Telephone Encounter (Signed)
Her discomfort was reported to be positional and I doubt related to her procedure which was relatively straightforward. I suspect musculoskeletal discomfort. I called her back but no answer and left a message with my advice, will reassess her tomorrow.

## 2016-05-25 NOTE — Telephone Encounter (Signed)
  Follow up Call-  Call back number 05/24/2016 10/15/2013  Post procedure Call Back phone  # [phone]  Permission to leave phone message Yes Yes  Some recent data might be hidden     Patient questions:  Do you have a fever, pain , or abdominal swelling? Yes.   Pain Score  6 *  Have you tolerated food without any problems? Yes.    Have you been able to return to your normal activities? Yes.    Do you have any questions about your discharge instructions: Diet   No. Medications  No. Follow up visit  No.  Do you have questions or concerns about your Care? No.  Actions: * If pain score is 4 or above: Physician/ provider Notified : Carlota Raspberry. Armbruster, MD. Patient stating she is having a "bruised like pain", 6/10 , from her biopsies. Stating it when she lifts or looks to the side in the car. Patient stating this is new since her procedure. Denies any bleeding, oral or rectal, fever or swelling. Note forward to Dr. Wilma Flavin.

## 2016-05-26 NOTE — Telephone Encounter (Signed)
I called the patient. She reports symptoms have for the most part resolved. I don't think it was related to her endoscopy, sounded musculoskeletal. If it recurs she can contact us. We will contact her with path results when it returns.

## 2016-06-01 ENCOUNTER — Telehealth: Payer: Self-pay | Admitting: Gastroenterology

## 2016-06-01 NOTE — Telephone Encounter (Signed)
Noted. I wrote a response in result note for her EGD if you can contact her. Thanks

## 2016-06-01 NOTE — Telephone Encounter (Signed)
Called patient back, no answer, lvm to call office.

## 2016-06-05 ENCOUNTER — Telehealth: Payer: Self-pay | Admitting: Gastroenterology

## 2016-06-05 NOTE — Telephone Encounter (Signed)
Called patient back, had to leave a voice message again. I have left a message on 4/12, 4/13 and again today.

## 2016-06-06 ENCOUNTER — Other Ambulatory Visit: Payer: Self-pay

## 2016-06-06 MED ORDER — PROMETHAZINE HCL 12.5 MG PO TABS: 12.5000 mg | ORAL_TABLET | Freq: Two times a day (BID) | ORAL | 0 refills | Status: DC | PRN

## 2016-06-06 MED ORDER — ESOMEPRAZOLE MAGNESIUM 40 MG PO CPDR: 40.0000 mg | DELAYED_RELEASE_CAPSULE | Freq: Two times a day (BID) | ORAL | 1 refills | Status: DC

## 2016-06-12 ENCOUNTER — Other Ambulatory Visit: Payer: Self-pay | Admitting: Family Medicine

## 2016-06-12 DIAGNOSIS — Z1231 Encounter for screening mammogram for malignant neoplasm of breast: Secondary | ICD-10-CM

## 2016-06-30 ENCOUNTER — Ambulatory Visit
Admission: RE | Admit: 2016-06-30 | Discharge: 2016-06-30 | Disposition: A | Discharge status: Home / Self Care | Payer: Medicare Other | Source: Ambulatory Visit | Attending: Family Medicine | Admitting: Family Medicine

## 2016-06-30 DIAGNOSIS — Z1231 Encounter for screening mammogram for malignant neoplasm of breast: Secondary | ICD-10-CM

## 2016-07-25 ENCOUNTER — Other Ambulatory Visit: Payer: Self-pay | Admitting: Plastic Surgery

## 2016-12-22 ENCOUNTER — Other Ambulatory Visit: Payer: Self-pay | Admitting: Physician Assistant

## 2016-12-22 NOTE — Telephone Encounter (Signed)
Yes we can give her 90 capsules with 3 refills. Thanks

## 2017-04-13 ENCOUNTER — Ambulatory Visit: Payer: Self-pay | Admitting: Gastroenterology

## 2017-05-22 ENCOUNTER — Other Ambulatory Visit: Payer: Self-pay | Admitting: Gastroenterology

## 2017-05-22 NOTE — Telephone Encounter (Signed)
Called and LM for pt to clarify if she is taking once or twice a day.

## 2017-05-23 ENCOUNTER — Other Ambulatory Visit: Payer: Self-pay

## 2017-05-23 NOTE — Progress Notes (Signed)
Remove duplicate medication

## 2017-06-22 ENCOUNTER — Other Ambulatory Visit: Payer: Self-pay | Admitting: Nurse Practitioner

## 2017-06-22 DIAGNOSIS — Z1231 Encounter for screening mammogram for malignant neoplasm of breast: Secondary | ICD-10-CM

## 2017-07-13 ENCOUNTER — Ambulatory Visit
Admission: RE | Admit: 2017-07-13 | Discharge: 2017-07-13 | Disposition: A | Discharge status: Home / Self Care | Payer: Medicare Other | Source: Ambulatory Visit | Attending: Nurse Practitioner | Admitting: Nurse Practitioner

## 2017-07-13 DIAGNOSIS — Z1231 Encounter for screening mammogram for malignant neoplasm of breast: Secondary | ICD-10-CM

## 2017-07-17 ENCOUNTER — Other Ambulatory Visit: Payer: Self-pay | Admitting: Nurse Practitioner

## 2017-07-17 DIAGNOSIS — R928 Other abnormal and inconclusive findings on diagnostic imaging of breast: Secondary | ICD-10-CM

## 2017-07-19 ENCOUNTER — Other Ambulatory Visit: Payer: Self-pay

## 2017-07-20 ENCOUNTER — Ambulatory Visit
Admission: RE | Admit: 2017-07-20 | Discharge: 2017-07-20 | Disposition: A | Discharge status: Home / Self Care | Payer: Medicare Other | Source: Ambulatory Visit | Attending: Nurse Practitioner | Admitting: Nurse Practitioner

## 2017-07-20 ENCOUNTER — Other Ambulatory Visit: Payer: Self-pay | Admitting: Nurse Practitioner

## 2017-07-20 DIAGNOSIS — R928 Other abnormal and inconclusive findings on diagnostic imaging of breast: Secondary | ICD-10-CM

## 2017-07-20 DIAGNOSIS — N6320 Unspecified lump in the left breast, unspecified quadrant: Secondary | ICD-10-CM

## 2017-07-20 DIAGNOSIS — N6310 Unspecified lump in the right breast, unspecified quadrant: Secondary | ICD-10-CM

## 2017-09-21 ENCOUNTER — Encounter (HOSPITAL_COMMUNITY): Payer: Self-pay

## 2017-09-21 ENCOUNTER — Other Ambulatory Visit: Payer: Self-pay

## 2017-09-21 ENCOUNTER — Ambulatory Visit (HOSPITAL_COMMUNITY)
Admission: EM | Admit: 2017-09-21 | Discharge: 2017-09-21 | Disposition: A | Discharge status: Home / Self Care | Payer: Medicare Other | Attending: Internal Medicine | Admitting: Internal Medicine

## 2017-09-21 DIAGNOSIS — L600 Ingrowing nail: Secondary | ICD-10-CM

## 2017-09-21 MED ORDER — MUPIROCIN 2 % EX OINT: 1.0000 "application " | TOPICAL_OINTMENT | Freq: Two times a day (BID) | CUTANEOUS | 0 refills | Status: DC

## 2017-09-21 MED ORDER — CEPHALEXIN 500 MG PO CAPS: 500.0000 mg | ORAL_CAPSULE | Freq: Four times a day (QID) | ORAL | 0 refills | Status: DC

## 2017-09-21 NOTE — Discharge Instructions (Signed)
Keflex as directed.  Bactroban ointment as directed.  Keep wound clean and dry.  Monitor for spreading redness, increased warmth, purulent drainage, follow-up for reevaluation.  Otherwise follow-up with podiatry for further management needed.

## 2017-09-21 NOTE — ED Triage Notes (Signed)
Pt right foot great toe. Pt tried to removed part of a ingrown toe nail.

## 2017-09-21 NOTE — ED Provider Notes (Signed)
[facility]    CSN: [id number] Arrival date & time: 09/21/17  1011     History   Chief Complaint Chief Complaint  Patient presents with  . Appointment    1030  . Toe Pain    HPI Jordan [name] is a 57 y.o. female.   57 year old female comes in for right great toe pain/ingrown toe nail. States has had similar symptoms in the past after history of toe nail injury. Used razor blade today to try to remove toe nail. States noticed some drainage. Mild erythema without increased warmth. Denies fever, chills, night sweats. Denies numbness/tingling of the toe.      Past Medical History:  Diagnosis Date  . Acute upper respiratory infections of unspecified site   . ADD (attention deficit disorder)   . Allergic rhinitis due to pollen   . Anal fissure   . Anemia, unspecified   . Anxiety disorder   . Asthma   . Chicken pox   . Complication of anesthesia    wakes up slow  . Depressive disorder, not elsewhere classified   . Diffuse cystic mastopathy   . Falls   . Fibromyalgia   . GERD (gastroesophageal reflux disease)   . Headache(784.0)   . History of anorexia nervosa   . History of bulimia   . IBS (irritable bowel syndrome)   . 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 Lump or mass in breast   . Meningitis, unspecified(322.9)   . Other specified glaucoma   . Perforation of tympanic membrane, unspecified   . Radial styloid tenosynovitis   . Seizures [facility])     Patient Active Problem List   Diagnosis Date Noted  . Severe recurrent major depression without psychotic features ([place]) 09/27/2015    Class: Chronic  . Fibromyalgia 04/03/2013  . Back pain, lumbosacral 04/03/2013  . Degeneration of cervical intervertebral disc 04/03/2013  . Dyspepsia and disorder of function of stomach 03/16/2012  . Routine health maintenance 01/03/2012  . Mild cognitive impairment with memory loss 01/03/2012  . Cough 07/03/2011  . Bilateral dry eyes 05/13/2011  . Back pain, thoracic 06/15/2010   . MULTIPLE SCLEROSIS, PROGRESSIVE/RELAPSING 06/02/2009  . Incontinence without sensory awareness 06/01/2009  . NASAL POLYP 05/14/2009  . Panic disorder without agoraphobia 04/25/2007  . Carpal tunnel syndrome 04/25/2007  . NECK PAIN, CHRONIC 02/19/2007  . HAY FEVER 02/18/2007  . ANEMIA-NOS 12/05/2006  . DEPRESSION 12/05/2006  . GLAUCOMA NEC 12/05/2006  . TYMPANIC MEMBRANE PERFORATION 12/05/2006  . FIBROCYSTIC BREAST DISEASE 12/05/2006  . DE QUERVAIN'S TENOSYNOVITIS 12/05/2006  . Headache 12/05/2006  . BULIMIA, HX OF 12/05/2006  . ANOREXIA, HX OF 12/05/2006    Past Surgical History:  Procedure Laterality Date  . ADENOIDECTOMY    . ANKLE SURGERY  1992   right ankle - scar tissue  . DILATION AND CURETTAGE OF UTERUS  2006   History of Menometrorrhagia  . [place] to 1986   6 surgeries on right ear  . LAPAROSCOPY  1993   normal  . uterine ablation  2011  . WRIST SURGERY  2001   for de-quervaine - bilateral wrists - tendonitis    OB History   None      Home Medications    Prior to Admission medications   Medication Sig Start Date End Date Taking? Authorizing Provider  ALPRAZolam Duanne Moron) 0.5 MG tablet Take 0.5 mg by mouth at bedtime as needed. For sleep.  Also 1/2 tab during the  day.    [provider]  amphetamine-dextroamphetamine (ADDERALL XR) 30 MG 24 hr capsule Take 30 mg by mouth daily.    [provider]  Brexpiprazole (REXULTI) 1 MG TABS Take 0.5 tablets by mouth at bedtime.    [provider]  cephALEXin (KEFLEX) 500 MG capsule Take 1 capsule (500 mg total) by mouth 4 (four) times daily. 09/21/17   Tasia Catchings, Amy V, PA-C  cyclobenzaprine (FLEXERIL) 10 MG tablet Take 10 mg by mouth at bedtime as needed.  01/06/14   [provider]  DULoxetine (CYMBALTA) 60 MG capsule Take by mouth 2 (two) times daily.     [provider]  esomeprazole (NEXIUM) 40 MG capsule TAKE 1 CAPSULE BY MOUTH EVERY DAY BEFORE BREAKFAST 05/23/17    Armbruster, Carlota Raspberry, MD  fluticasone (FLONASE) 50 MCG/ACT nasal spray Place 1 spray into both nostrils daily as needed for allergies or rhinitis.    [provider]  mupirocin ointment (BACTROBAN) 2 % Apply 1 application topically 2 (two) times daily. 09/21/17   Ok Edwards, PA-C  ondansetron (ZOFRAN) 4 MG tablet Take one tab every 4-6 hours as needed for nausea 04/20/16   Levin Erp, PA  traZODone (DESYREL) 100 MG tablet Take 100 mg by mouth at bedtime.    [provider]    Family History Family History  Problem Relation Age of Onset  . Ovarian cancer Mother   . Heart disease Mother   . Hypertension Mother   . Coronary artery disease Mother   . Colon polyps Mother 9  . Heart failure Father   . Hypertension Father   . Hyperlipidemia Father   . Diabetes Father   . Breast cancer Maternal Grandmother   . Colon cancer Neg Hx   . Esophageal cancer Neg Hx   . Stomach cancer Neg Hx   . Rectal cancer Neg Hx     Social History Social History   Tobacco Use  . Smoking status: Never Smoker  . Smokeless tobacco: Never Used  Substance Use Topics  . Alcohol use: Yes    Alcohol/week: 8.4 oz    Types: 14 Glasses of wine per week    Comment: 2 glasses of wine/Day  . Drug use: No     Allergies   Mirtazapine   Review of Systems Review of Systems  Reason unable to perform ROS: See HPI as above.     Physical Exam Triage Vital Signs ED Triage Vitals [09/21/17 1026]  Enc Vitals Group     BP      Pulse      Resp      Temp      Temp src      SpO2      Weight 135 lb (61.2 kg)     Height      Head Circumference      Peak Flow      Pain Score 6     Pain Loc      Pain Edu?      Excl. in [place]?    No data found.  Updated Vital Signs BP 125/72   Pulse 76   Temp 98.1 F (36.7 C)   Resp 16   Wt 135 lb (61.2 kg)   SpO2 100%   BMI 24.69 kg/m   Physical Exam  Constitutional: She is oriented to person, place, and time. She appears well-developed  and well-nourished. No distress.  HENT:  Head: Normocephalic and atraumatic.  Eyes: Pupils are  equal, round, and reactive to light. Conjunctivae are normal.  Musculoskeletal:  See picture below, now partially scraped from razor.  Erythema to the toenail without increased warmth.  Mild tenderness to palpation.  No purulent drainage seen.  Full range of motion.  Sensation intact and equal bilaterally.  Pedal pulse 2+ and equal bilaterally.  Cap refill, tested on toe pad, less than 2 seconds.  Neurological: She is alert and oriented to person, place, and time.  Skin: She is not diaphoretic.       UC Treatments / Results  Labs (all labs ordered are listed, but only abnormal results are displayed) Labs Reviewed - No data to display  EKG None  Radiology No results found.  Procedures Excise Ingrown Toenail Date/Time: 09/21/2017 12:12 PM Performed by: Ok Edwards, PA-C Authorized by: Wynona Luna, MD   Consent:    Consent obtained:  Verbal   Consent given by:  Patient   Risks discussed:  Bleeding, incomplete removal, infection, pain and permanent nail deformity   Alternatives discussed:  Alternative treatment and referral Location:    Foot:  R big toe Pre-procedure details:    Skin preparation:  Betadine   Preparation: Patient was prepped and draped in the usual sterile fashion   Anesthesia (see MAR for exact dosages):    Anesthesia method:  Nerve block   Block location:  Right great toe   Block needle gauge:  27 G   Block anesthetic:  Lidocaine 2% w/o epi   Block injection procedure:  Anatomic landmarks identified, introduced needle, incremental injection, anatomic landmarks palpated and negative aspiration for blood   Block outcome:  Anesthesia achieved Nail Removal:    Nail removed:  Partial   Nail side:  Medial   Nail bed repaired: no     Removed nail replaced and anchored: no   Post-procedure details:    Dressing:  Antibiotic ointment and 4x4 sterile gauze    Patient tolerance of procedure:  Tolerated well, no immediate complications   (including critical care time)  Medications Ordered in UC Medications - No data to display  Initial Impression / Assessment and Plan / UC Course  I have reviewed the triage vital signs and the nursing notes.  Pertinent labs & imaging results that were available during my care of the patient were reviewed by me and considered in my medical decision making (see chart for details).    Discuss treating for infection versus further attempts at toenail removal and given thinning of toenail from razor blade.  Patient would like to proceed with toenail removal.  Patient tolerated procedure well, was able to remove medial toenail. Keflex as directed. Wound care instructions provided. Patient to follow up with podiatry for further evaluation and management needed. Return precautions given. Patient expresses understanding and agrees to plan.  Final Clinical Impressions(s) / UC Diagnoses   Final diagnoses:  Ingrown nail of great toe of right foot    ED Prescriptions    Medication Sig Dispense Auth. Provider   cephALEXin (KEFLEX) 500 MG capsule Take 1 capsule (500 mg total) by mouth 4 (four) times daily. 28 capsule Yu, Amy V, PA-C   mupirocin ointment (BACTROBAN) 2 % Apply 1 application topically 2 (two) times daily. 22 g Tobin Chad, [place] 09/21/17 1441

## 2018-01-23 ENCOUNTER — Other Ambulatory Visit: Payer: Self-pay

## 2018-02-25 ENCOUNTER — Ambulatory Visit
Admission: RE | Admit: 2018-02-25 | Discharge: 2018-02-25 | Disposition: A | Discharge status: Home / Self Care | Payer: Medicare Other | Source: Ambulatory Visit | Attending: Nurse Practitioner | Admitting: Nurse Practitioner

## 2018-02-25 ENCOUNTER — Other Ambulatory Visit: Payer: Self-pay | Admitting: Nurse Practitioner

## 2018-02-25 DIAGNOSIS — N6320 Unspecified lump in the left breast, unspecified quadrant: Secondary | ICD-10-CM

## 2018-02-25 DIAGNOSIS — N6310 Unspecified lump in the right breast, unspecified quadrant: Secondary | ICD-10-CM

## 2018-03-01 ENCOUNTER — Encounter: Payer: Self-pay | Admitting: Gastroenterology

## 2018-03-01 ENCOUNTER — Ambulatory Visit: Payer: Medicare Other | Admitting: Gastroenterology

## 2018-03-01 VITALS — BP 100/68 | HR 74 | Ht 63.0 in | Wt 145.4 lb

## 2018-03-01 DIAGNOSIS — R110 Nausea: Secondary | ICD-10-CM

## 2018-03-01 DIAGNOSIS — K219 Gastro-esophageal reflux disease without esophagitis: Secondary | ICD-10-CM | POA: Diagnosis not present

## 2018-03-01 DIAGNOSIS — R6881 Early satiety: Secondary | ICD-10-CM

## 2018-03-01 MED ORDER — SUCRALFATE 1 GM/10ML PO SUSP: 1.0000 g | Freq: Four times a day (QID) | ORAL | 3 refills | Status: DC | PRN

## 2018-03-01 MED ORDER — ONDANSETRON HCL 4 MG PO TABS: ORAL_TABLET | ORAL | 2 refills | Status: DC

## 2018-03-01 MED ORDER — ESOMEPRAZOLE MAGNESIUM 40 MG PO CPDR: 40.0000 mg | DELAYED_RELEASE_CAPSULE | Freq: Two times a day (BID) | ORAL | 5 refills | Status: DC

## 2018-03-01 NOTE — Patient Instructions (Signed)
We have sent the following medications to your pharmacy for you to pick up at your convenience: Zofran Nexium twice daily Carafate Liquid-10 ml every 6 hours as needed (3 refills)  You have been scheduled for a gastric emptying scan at [facility] on 03/12/2018 at 9:30 am. Please arrive at least 15 minutes prior to your appointment for registration. Please make certain not to have anything to eat or drink after midnight the night before your test. Hold all stomach medications (ex: Zofran, phenergan, Reglan) 48 hours prior to your test. If you need to reschedule your appointment, please contact radiology scheduling at [phone]. _________________________________________________________ A gastric-emptying study measures how long it takes for food to move through your stomach. There are several ways to measure stomach emptying. In the most common test, you eat food that contains a small amount of radioactive material. A scanner that detects the movement of the radioactive material is placed over your abdomen to monitor the rate at which food leaves your stomach. This test normally takes about 4 hours to complete. _________________________________________________________ If you are age 81 or older, your body mass index should be between 23-30. Your Body mass index is 25.75 kg/m. If this is out of the aforementioned range listed, please consider follow up with your Primary Care Provider.  If you are age 34 or younger, your body mass index should be between 19-25. Your Body mass index is 25.75 kg/m. If this is out of the aformentioned range listed, please consider follow up with your Primary Care Provider.

## 2018-03-01 NOTE — Progress Notes (Signed)
HPI :  58 year old female here for a follow-up visit for reflux.  I last saw her in April 2018 for upper endoscopy for symptoms of reflux. At that point she had a 4cm hiatal hernia and a benign esophageal polyp, otherwise normal exam. She has been on Nexium 40 mg once a day for her GERD since that time. He had been doing okay however she is having more frequent symptoms at this time. She has symptoms of pyrosis and regurgitation every day, in the morning which she wakes up, and also at night when she is lying in bed. She tries to avoid trigger foods and is compliant with her medications, however symptoms persist. She has gained about 20 pounds in the past 2-3 years. She denies any dysphagia, no odynophagia. She does have early satiety with most meals. She also has frequent nausea, after she eats, and occasional vomiting. She has been using Zofran as needed which really helps the nausea. She has not tried higher dose of PPI yet. She's never had a prior gastric imaging study. No history of diabetes.   EGD 05/24/2016 - 4cm HH, 22mm polyp removed at 30cm,  Colonoscopy 09/2013 - normal  Past Medical History:  Diagnosis Date  . Acute upper respiratory infections of unspecified site   . ADD (attention deficit disorder)   . Allergic rhinitis due to pollen   . Anal fissure   . Anemia, unspecified   . Anxiety disorder   . Asthma   . Chicken pox   . Complication of anesthesia    wakes up slow  . Depressive disorder, not elsewhere classified   . Diffuse cystic mastopathy   . Falls   . Fibromyalgia   . GERD (gastroesophageal reflux disease)   . Headache(784.0)   . History of anorexia nervosa   . History of bulimia   . IBS (irritable bowel syndrome)   . 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 Lump or mass in breast   . Meningitis, unspecified(322.9)   . Other specified glaucoma   . Perforation of tympanic membrane, unspecified   . Radial styloid tenosynovitis   . Seizures ([place])      Past Surgical  History:  Procedure Laterality Date  . ADENOIDECTOMY    . ANKLE SURGERY  1992   right ankle - scar tissue  . DILATION AND CURETTAGE OF UTERUS  2006   History of Menometrorrhagia  . [place] to 1986   6 surgeries on right ear  . LAPAROSCOPY  1993   normal  . REDUCTION MAMMAPLASTY Bilateral 2018  . uterine ablation  2011  . WRIST SURGERY  2001   for de-quervaine - bilateral wrists - tendonitis   Family History  Problem Relation Age of Onset  . Ovarian cancer Mother   . Heart disease Mother   . Hypertension Mother   . Coronary artery disease Mother   . Colon polyps Mother 10  . Heart failure Father   . Hypertension Father   . Hyperlipidemia Father   . Diabetes Father   . Breast cancer Maternal Grandmother   . Colon cancer Neg Hx   . Esophageal cancer Neg Hx   . Stomach cancer Neg Hx   . Rectal cancer Neg Hx    Social History   Tobacco Use  . Smoking status: Never Smoker  . Smokeless tobacco: Never Used  Substance Use Topics  . Alcohol use: Yes    Alcohol/week: 14.0 standard drinks    Types: 14  Glasses of wine per week    Comment: 2 glasses of wine/Day  . Drug use: No   Current Outpatient Medications  Medication Sig Dispense Refill  . ALPRAZolam (XANAX) 0.5 MG tablet Take 0.5 mg by mouth at bedtime as needed. For sleep.  Also 1/2 tab during the day.    . amphetamine-dextroamphetamine (ADDERALL XR) 30 MG 24 hr capsule Take 30 mg by mouth daily.    . Brexpiprazole (REXULTI) 1 MG TABS Take 1 tablet by mouth at bedtime.     . cyclobenzaprine (FLEXERIL) 10 MG tablet Take 10 mg by mouth at bedtime as needed.     . DULoxetine (CYMBALTA) 60 MG capsule Take by mouth 2 (two) times daily.     Marland Kitchen esomeprazole (NEXIUM) 40 MG capsule TAKE 1 CAPSULE BY MOUTH EVERY DAY BEFORE BREAKFAST 30 capsule 5  . fluticasone (FLONASE) 50 MCG/ACT nasal spray Place 1 spray into both nostrils daily as needed for allergies or rhinitis.    Marland Kitchen ondansetron (ZOFRAN) 4 MG tablet Take one  tab every 4-6 hours as needed for nausea 30 tablet 1  . traZODone (DESYREL) 100 MG tablet Take 100 mg by mouth at bedtime.     No current facility-administered medications for this visit.    Allergies  Allergen Reactions  . Mirtazapine Other (See Comments)    Severe hallucinations     Review of Systems: All systems reviewed and negative except where noted in HPI.   Lab Results  Component Value Date   WBC 5.6 08/15/2013   HGB 14.1 08/15/2013   HCT 40.6 08/15/2013   MCV 95.2 08/15/2013   PLT 205.0 08/15/2013    Lab Results  Component Value Date   CREATININE 1.0 08/15/2013   BUN 13 08/15/2013   NA 142 08/15/2013   K 4.3 08/15/2013   CL 104 08/15/2013   CO2 30 08/15/2013    Lab Results  Component Value Date   ALT 20 08/15/2013   AST 22 08/15/2013   ALKPHOS 75 08/15/2013   BILITOT 0.6 08/15/2013     Physical Exam: BP 100/68   Pulse 74   Ht 5\' 3"  (1.6 m)   Wt 145 lb 6 oz (65.9 kg)   BMI 25.75 kg/m  Constitutional: Pleasant,well-developed, female in no acute distress. HEENT: Normocephalic and atraumatic. Conjunctivae are normal. No scleral icterus. Neck supple.  Cardiovascular: Normal rate, regular rhythm.  Pulmonary/chest: Effort normal and breath sounds normal. No wheezing, rales or rhonchi. Abdominal: Soft, nondistended, nontender. . There are no masses palpable. No hepatomegaly. Extremities: no edema Lymphadenopathy: No cervical adenopathy noted. Neurological: Alert and oriented to person place and time. Skin: Skin is warm and dry. No rashes noted. Psychiatric: Normal mood and affect. Behavior is normal.   ASSESSMENT AND PLAN: 58 year old female here for reassessment of the following issues:  GERD / early satiety / nausea - ongoing reflux symptoms in the setting of a moderate hiatal hernia despite moderate dose nexium. Given her postprandial symptoms and occasional vomiting, raises the possibility for gastroparesis. We discussed options for further  workup and management moving forward. I'll refer her for a gastric emptying study to exclude gastroparesis. I recommend increasing her Nexium to 40 mg twice a day for a few weeks to see if that can better control symptoms. I will refill her Zofran she can take it as needed for her nausea which helps. I will also give her some liquid Carafate to use as needed as well. Moving forward if she does have gastroparesis we  will treat that. If she has no evidence of gastroparesis and her symptoms persist despite higher dose PPI, we may need to consider surgical repair of the hernia with Nissen fundoplication. Unfortunately she is not a candidate for TIF given the size of her hiatal hernia. To ensure she is a surgical candidate, prior to that would need manometry and pH testing. We discussed all of these scenarios and she verbalized understanding. Will await her course on regimen and GES first. She agreed.   Scotia Cellar, MD [facility]  CC: Bernerd Limbo, MD

## 2018-03-11 ENCOUNTER — Telehealth: Payer: Self-pay | Admitting: Gastroenterology

## 2018-03-11 NOTE — Telephone Encounter (Signed)
Patient states she is suppose to have a gastric emptying done tomorrow 1.21.2020 but she has had a illness in the family and would like to reschedule to next week if possible.

## 2018-03-11 NOTE — Telephone Encounter (Signed)
Patient provided the number to central scheduling to reschedule/

## 2018-03-12 ENCOUNTER — Ambulatory Visit (HOSPITAL_COMMUNITY): Payer: Medicare Other

## 2018-03-27 ENCOUNTER — Encounter (HOSPITAL_COMMUNITY)
Admission: RE | Admit: 2018-03-27 | Discharge: 2018-03-27 | Disposition: A | Discharge status: Home / Self Care | Payer: Medicare Other | Source: Ambulatory Visit | Attending: Gastroenterology | Admitting: Gastroenterology

## 2018-03-27 DIAGNOSIS — R110 Nausea: Secondary | ICD-10-CM | POA: Diagnosis present

## 2018-03-27 MED ORDER — TECHNETIUM TC 99M SULFUR COLLOID
2.1000 | Freq: Once | INTRAVENOUS | Status: AC | PRN
  Administered 2018-03-27: 2.1 via INTRAVENOUS

## 2018-04-03 ENCOUNTER — Telehealth: Payer: Self-pay | Admitting: Gastroenterology

## 2018-04-03 NOTE — Telephone Encounter (Signed)
Read the results note to her from the ordering provider. Patient admits she did not check her My Chart account. States she usually does, but just did not think of it. Thanks me for the call.

## 2018-08-27 ENCOUNTER — Other Ambulatory Visit: Payer: Self-pay

## 2018-08-27 ENCOUNTER — Inpatient Hospital Stay: Admission: RE | Admit: 2018-08-27 | Payer: Self-pay | Source: Ambulatory Visit

## 2018-09-06 ENCOUNTER — Telehealth: Payer: Self-pay | Admitting: Gastroenterology

## 2018-09-06 NOTE — Telephone Encounter (Signed)
Called patient and her LLQ pain is better but not gone. Her PCP put her on Metronidazole - 250 mg TID for 1 wk. And Cipro - 500mg  BID for 1 wk. In Person office visit scheduled 10/08/18 at 11:00am

## 2018-09-06 NOTE — Telephone Encounter (Signed)
Great thanks Esther 

## 2018-09-06 NOTE — Telephone Encounter (Signed)
Received a fax from Dr. Coletta Memos regarding this patient, she had a CT scan done on 09/04/18 showing inflammatory changes in the sigmoid colon c/w mild diverticulitis, omental infarct, or epiploic appendagitis. Her last colonoscopy was in 2015 and no mention of diverticulitis.  Jordan [name] can you touch base with this patient to see how she is doing? If she is having LLQ pain I would give her antibiotics and treat for possible diverticulitis. She should also have a follow up in our office for consideration of colonoscopy given CT findings, if you can help coordinate. Thanks

## 2018-10-08 ENCOUNTER — Ambulatory Visit: Payer: Medicare Other | Admitting: Gastroenterology

## 2018-10-31 ENCOUNTER — Other Ambulatory Visit: Payer: Self-pay | Admitting: Gastroenterology

## 2018-11-01 NOTE — Telephone Encounter (Signed)
OK to refill

## 2018-11-01 NOTE — Telephone Encounter (Signed)
Yes if it helps her, okay to refill and use PRN

## 2018-11-18 ENCOUNTER — Ambulatory Visit: Payer: Medicare Other | Admitting: Gastroenterology

## 2018-11-26 ENCOUNTER — Other Ambulatory Visit: Payer: Self-pay | Admitting: Gastroenterology

## 2019-02-12 ENCOUNTER — Other Ambulatory Visit: Payer: Self-pay

## 2019-03-05 ENCOUNTER — Ambulatory Visit: Payer: Medicare Other | Admitting: Gastroenterology

## 2019-03-05 NOTE — Progress Notes (Signed)
No show letter mailed to patient and sent to [facility]

## 2019-03-09 ENCOUNTER — Other Ambulatory Visit: Payer: Self-pay | Admitting: Gastroenterology

## 2019-04-10 ENCOUNTER — Other Ambulatory Visit: Payer: Self-pay | Admitting: Gastroenterology

## 2019-04-14 ENCOUNTER — Other Ambulatory Visit: Payer: Self-pay

## 2019-04-14 DIAGNOSIS — R109 Unspecified abdominal pain: Secondary | ICD-10-CM

## 2019-04-14 MED ORDER — DICYCLOMINE HCL 10 MG PO CAPS: 10.0000 mg | ORAL_CAPSULE | Freq: Three times a day (TID) | ORAL | 1 refills | Status: DC | PRN

## 2019-04-21 ENCOUNTER — Ambulatory Visit (HOSPITAL_COMMUNITY)
Admission: RE | Admit: 2019-04-21 | Discharge: 2019-04-21 | Disposition: A | Discharge status: Home / Self Care | Payer: Medicare Other | Source: Ambulatory Visit | Attending: Gastroenterology | Admitting: Gastroenterology

## 2019-04-21 ENCOUNTER — Telehealth: Payer: Self-pay

## 2019-04-21 ENCOUNTER — Other Ambulatory Visit: Payer: Self-pay

## 2019-04-21 DIAGNOSIS — R109 Unspecified abdominal pain: Secondary | ICD-10-CM | POA: Diagnosis not present

## 2019-04-21 MED ORDER — IOHEXOL 300 MG/ML  SOLN
100.0000 mL | Freq: Once | INTRAMUSCULAR | Status: AC | PRN
  Administered 2019-04-21: 100 mL via INTRAVENOUS

## 2019-04-21 MED ORDER — SODIUM CHLORIDE (PF) 0.9 % IJ SOLN
INTRAMUSCULAR | Status: AC
  Filled 2019-04-21: qty 50

## 2019-04-21 NOTE — Telephone Encounter (Signed)
Left message for patient to please call back. 

## 2019-04-21 NOTE — Telephone Encounter (Signed)
Pt returned your call.  

## 2019-04-21 NOTE — Telephone Encounter (Signed)
See result note.  

## 2019-05-07 ENCOUNTER — Encounter: Payer: Self-pay | Admitting: Gastroenterology

## 2019-05-07 ENCOUNTER — Ambulatory Visit: Payer: Medicare Other | Admitting: Gastroenterology

## 2019-05-07 ENCOUNTER — Ambulatory Visit (INDEPENDENT_AMBULATORY_CARE_PROVIDER_SITE_OTHER): Payer: Medicare Other

## 2019-05-07 ENCOUNTER — Other Ambulatory Visit: Payer: Self-pay

## 2019-05-07 VITALS — BP 116/72 | HR 85 | Temp 98.5°F | Ht 63.0 in | Wt 153.0 lb

## 2019-05-07 DIAGNOSIS — K219 Gastro-esophageal reflux disease without esophagitis: Secondary | ICD-10-CM

## 2019-05-07 DIAGNOSIS — K449 Diaphragmatic hernia without obstruction or gangrene: Secondary | ICD-10-CM

## 2019-05-07 DIAGNOSIS — Z1159 Encounter for screening for other viral diseases: Secondary | ICD-10-CM

## 2019-05-07 DIAGNOSIS — K920 Hematemesis: Secondary | ICD-10-CM | POA: Diagnosis not present

## 2019-05-07 DIAGNOSIS — K5732 Diverticulitis of large intestine without perforation or abscess without bleeding: Secondary | ICD-10-CM | POA: Diagnosis not present

## 2019-05-07 DIAGNOSIS — Z01818 Encounter for other preprocedural examination: Secondary | ICD-10-CM

## 2019-05-07 MED ORDER — SUCRALFATE 1 G PO TABS: 1.0000 g | ORAL_TABLET | Freq: Four times a day (QID) | ORAL | 3 refills | Status: DC | PRN

## 2019-05-07 MED ORDER — SUPREP BOWEL PREP KIT 17.5-3.13-1.6 GM/177ML PO SOLN: ORAL | 0 refills | Status: DC

## 2019-05-07 MED ORDER — ONDANSETRON 4 MG PO TBDP: 4.0000 mg | ORAL_TABLET | Freq: Three times a day (TID) | ORAL | 1 refills | Status: DC | PRN

## 2019-05-07 NOTE — Progress Notes (Signed)
HPI :  59 year old female with a history of GERD, large hiatal hernia, history of diverticulitis, here for a follow-up visit.  Since her last clinic she underwent a gastric emptying study in February 2020 which was normal, no evidence of gastroparesis.  She has continued to have severe and persistent reflux and regurgitation with occasional vomiting that bothers her.  She is taking her Nexium twice a day but symptoms persist despite this.  We will try to give her liquid Carafate which she had a hard time affording and did not try it in the past.  She has been taking Zofran at night which helps her sleep and reduces her nausea.  She remains nauseated frequently throughout the day.  She has waterbrash and ongoing pyrosis, often will regurgitate that leads to vomiting.  We have discussed repair of hiatal hernia in the past but have not proceeded with that yet.  She did have an episode of severe epigastric pain associated with hematemesis/coffee-ground emesis near the end of February.  She states after she vomited the blood she felt much better.  She is not had recurrence of that.  She denies any NSAID use.  Her primary care tested for H. pylori via a serology which was positive, she completed antibiotic regimen for that and states she felt a bit better in that regard since taking it.  She has been compliant with Nexium 40 mg twice a day.  She had left-sided abdominal pain July 2020 and her primary care ordered CT scan at [facility] which suggested colonic diverticulitis of the left colon.  Her last colonoscopy was in 2015 with Dr. Deatra Ina reportedly normal, no report of diverticulosis noted.  She had a recurrence of severe lower to left-sided pain a few weeks ago and a CT scan was performed to assess for diverticulitis.  There was no evidence of active diverticulitis causing her symptoms, but they noted large hiatal hernia that seems worse compared to prior imaging.  Prior workup: EGD 05/24/2016 - 4cm HH, 49mm  polyp removed at 30cm,  Colonoscopy 09/2013 - normal  GES 03/27/18 - normal  CT scan 04/21/19 - IMPRESSION: 1. Moderate to large hiatal hernia, paraesophageal and sliding type, with no signs of stranding about the herniated stomach. This is new when compared to the previous examination. 2. No evidence of acute intra-abdominal pathology.   CT scan 09/04/18: [facility] - suspected sigmoid diverticulitis, moderate hiatal hernia   Past Medical History:  Diagnosis Date  . Acute upper respiratory infections of unspecified site   . ADD (attention deficit disorder)   . Allergic rhinitis due to pollen   . Anal fissure   . Anemia, unspecified   . Anxiety disorder   . Asthma   . Chicken pox   . Complication of anesthesia    wakes up slow  . Depressive disorder, not elsewhere classified   . Diffuse cystic mastopathy   . Falls   . Fibromyalgia   . GERD (gastroesophageal reflux disease)   . Headache(784.0)   . History of anorexia nervosa   . History of bulimia   . IBS (irritable bowel syndrome)   . 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 Lump or mass in breast   . Meningitis, unspecified(322.9)   . Other specified glaucoma   . Perforation of tympanic membrane, unspecified   . Radial styloid tenosynovitis   . Seizures ([place])      Past Surgical History:  Procedure Laterality Date  . ADENOIDECTOMY    . ANKLE SURGERY  1992   right ankle - scar tissue  . DILATION AND CURETTAGE OF UTERUS  2006   History of Menometrorrhagia  . [place] to 1986   6 surgeries on right ear  . LAPAROSCOPY  1993   normal  . REDUCTION MAMMAPLASTY Bilateral 2018  . uterine ablation  2011  . WRIST SURGERY  2001   for de-quervaine - bilateral wrists - tendonitis   Family History  Problem Relation Age of Onset  . Ovarian cancer Mother   . Heart disease Mother   . Hypertension Mother   . Coronary artery disease Mother   . Colon polyps Mother 70  . Heart failure Father   . Hypertension Father   .  Hyperlipidemia Father   . Diabetes Father   . Breast cancer Maternal Grandmother   . Colon cancer Neg Hx   . Esophageal cancer Neg Hx   . Stomach cancer Neg Hx   . Rectal cancer Neg Hx    Social History   Tobacco Use  . Smoking status: Never Smoker  . Smokeless tobacco: Never Used  Substance Use Topics  . Alcohol use: Yes    Alcohol/week: 7.0 standard drinks    Types: 7 Glasses of wine per week  . Drug use: No   Current Outpatient Medications  Medication Sig Dispense Refill  . ALPRAZolam (XANAX) 0.5 MG tablet Take 0.5 mg by mouth at bedtime as needed. For sleep.  Also 1/2 tab during the day.    . amphetamine-dextroamphetamine (ADDERALL XR) 30 MG 24 hr capsule Take 30 mg by mouth daily.    . Brexpiprazole (REXULTI) 1 MG TABS Take 1 tablet by mouth at bedtime.     . cyclobenzaprine (FLEXERIL) 10 MG tablet Take 10 mg by mouth at bedtime as needed.     . dicyclomine (BENTYL) 10 MG capsule Take 1 capsule (10 mg total) by mouth every 8 (eight) hours as needed for spasms. 60 capsule 1  . DULoxetine (CYMBALTA) 60 MG capsule Take by mouth 2 (two) times daily.     Marland Kitchen esomeprazole (NEXIUM) 40 MG capsule Take 1 capsule (40 mg total) by mouth 2 (two) times daily. Please schedule an appointment for further refills. Thank you 60 capsule 1  . fluticasone (FLONASE) 50 MCG/ACT nasal spray Place 1 spray into both nostrils daily as needed for allergies or rhinitis.    . Multiple Vitamin (MULTIVITAMIN) tablet Take 1 tablet by mouth daily.    . ondansetron (ZOFRAN) 4 MG tablet TAKE 1 TABLET BY MOUTH EVERY 4-6 HOURS AS NEEDED 30 tablet 2  . traZODone (DESYREL) 100 MG tablet Take 100 mg by mouth at bedtime.     No current facility-administered medications for this visit.   Allergies  Allergen Reactions  . Mirtazapine Other (See Comments)    Severe hallucinations     Review of Systems: All systems reviewed and negative except where noted in HPI.    CT Abdomen Pelvis W Contrast  Result Date:  04/21/2019 CLINICAL DATA:  Abdominal pain, recent flare-up diverticulitis with nausea and vomiting EXAM: CT ABDOMEN AND PELVIS WITH CONTRAST TECHNIQUE: Multidetector CT imaging of the abdomen and pelvis was performed using the standard protocol following bolus administration of intravenous contrast. CONTRAST:  131mL OMNIPAQUE IOHEXOL 300 MG/ML  SOLN COMPARISON:  06/23/2009 FINDINGS: Lower chest: Lung incidental imaging of the lung bases is normal. Hepatobiliary: Biliary liver is normal. Portal vein is patent. No signs of pericholecystic stranding or inflammation. No biliary ductal dilation. Pancreas:  Pancreas is normal without signs of inflammation, ductal dilation or suspicious lesion. Spleen: Spleen is normal size. No focal lesion. Full Adrenals/Urinary Tract: Adrenal glands are normal. Kidneys with smooth contours, no signs of hydronephrosis. Symmetric renal enhancement. No suspicious renal lesion. Stomach/Bowel: Moderate to large hiatal hernia, paraesophageal and sliding type, mixed type is suspected. No signs of stranding about the herniated stomach. CT appearance of the remainder of bowel is normal aside from scattered colonic diverticulosis. No pericecal inflammation. The appendix not clearly a visualized. No findings to indicate acute appendicitis. Vascular/Lymphatic: The vascular structures in the abdomen are patent without aneurysm. No signs of adenopathy. No signs of pelvic lymphadenopathy. Reproductive: [place] normal CT appearance of uterus and adnexa. Other: No abdominal wall hernia or abnormality. No abdominopelvic ascites. Musculoskeletal: No acute bone finding or destructive bone process. IMPRESSION: 1. Moderate to large hiatal hernia, paraesophageal and sliding type, with no signs of stranding about the herniated stomach. This is new when compared to the previous examination. 2. No evidence of acute intra-abdominal pathology. Electronically Signed   By: Zetta Bills M.D.   On: 04/21/2019 08:17     Physical Exam: BP 116/72   Pulse 85   Temp 98.5 F (36.9 C)   Ht 5\' 3"  (1.6 m)   Wt 153 lb (69.4 kg)   SpO2 98%   BMI 27.10 kg/m  Constitutional: Pleasant,well-developed, female in no acute distress. HEENT: Normocephalic and atraumatic. Conjunctivae are normal. No scleral icterus. Neck supple.  Cardiovascular: Normal rate, regular rhythm.  Pulmonary/chest: Effort normal and breath sounds normal. No wheezing, rales or rhonchi. Abdominal: Soft, nondistended, some epigastric mild TTP.  There are no masses palpable. Extremities: no edema Lymphadenopathy: No cervical adenopathy noted. Neurological: Alert and oriented to person place and time. Skin: Skin is warm and dry. No rashes noted. Psychiatric: Normal mood and affect. Behavior is normal.   ASSESSMENT AND PLAN: 59 year old female here for reassessment of the following:  GERD / hematemesis / hiatal hernia / nausea - persistent upper tract symptoms, namely severe reflux with regurgitation and occasional vomiting as well as chronic nausea, also with recent episode of coffee-ground emesis.  Treated for H. pylori based on serology.  Her reflux symptoms and nausea persist despite high-dose Nexium.  Her gastric emptying study was negative.  CT scan shows large hiatal hernia.  I suspect the hiatal hernia is predisposing her to severe reflux in these upper tract symptoms, perhaps she has Lysbeth Galas lesions that led to the hematemesis.  I think she warrants a surgical evaluation at this time to discuss repair of the hernia given her persistent symptoms.  In light of her recent episode of hematemesis and her last EGD was almost 3 years ago, recommend EGD now to reassess her stomach, confirm H. pylori eradication, rule out PUD, remeasure the hiatal hernia prior to surgical evaluation.  I discussed risk and benefits of endoscopy and anesthesia she want to proceed.  She will continue her Nexium for now.  I recommend she take her Zofran every 8 hours as  needed if that helps her.  I will also give her a trial of Carafate tablets that she can dissolve and see if that is more affordable for her in the interim.  Further recommendations pending results of her exam and her course.  Diverticulitis - symptoms consistent with diverticulitis last July with CT changes most consistent with that.  Recurrence of pain in recent weeks however repeat CT scan did not show this.  In light of her prior  CT scan findings however and that her last colonoscopy was in 2015, follow-up colonoscopy is recommended to further evaluate.  I discussed risk benefits of colonoscopy and anesthesia and she wanted to proceed.  Further recommendations pending the results.  Mayview Cellar, MD [facility]

## 2019-05-07 NOTE — Patient Instructions (Addendum)
If you are age 59 or older, your body mass index should be between 23-30. Your Body mass index is 27.1 kg/m. If this is out of the aforementioned range listed, please consider follow up with your Primary Care Provider.  If you are age 12 or younger, your body mass index should be between 19-25. Your Body mass index is 27.1 kg/m. If this is out of the aformentioned range listed, please consider follow up with your Primary Care Provider.   You have been scheduled for an endoscopy and colonoscopy. Please follow the written instructions given to you at your visit today. Please pick up your prep supplies at the pharmacy within the next 1-3 days. If you use inhalers (even only as needed), please bring them with you on the day of your procedure.   We have sent the following medications to your pharmacy for you to pick up at your convenience: Zofran: Take every 8 hours as needed  Carafate: Take 1 tablet by mouth every 6 hours as needed.  Slowly dissolve tablet in 1 Tablespoon of distilled water before taking.  Thank you for entrusting me with your care and for choosing [facility], Dr.  Cellar

## 2019-05-08 ENCOUNTER — Encounter: Payer: Self-pay | Admitting: Gastroenterology

## 2019-05-08 LAB — SARS CORONAVIRUS 2 (TAT 6-24 HRS): SARS Coronavirus 2: NEGATIVE

## 2019-05-09 ENCOUNTER — Other Ambulatory Visit: Payer: Self-pay

## 2019-05-09 ENCOUNTER — Encounter: Payer: Self-pay | Admitting: Gastroenterology

## 2019-05-09 ENCOUNTER — Ambulatory Visit (AMBULATORY_SURGERY_CENTER): Payer: Medicare Other | Admitting: Gastroenterology

## 2019-05-09 VITALS — BP 112/85 | HR 70 | Temp 96.0°F | Resp 15 | Ht 63.0 in | Wt 153.0 lb

## 2019-05-09 DIAGNOSIS — K5732 Diverticulitis of large intestine without perforation or abscess without bleeding: Secondary | ICD-10-CM

## 2019-05-09 DIAGNOSIS — K3189 Other diseases of stomach and duodenum: Secondary | ICD-10-CM

## 2019-05-09 DIAGNOSIS — K2951 Unspecified chronic gastritis with bleeding: Secondary | ICD-10-CM | POA: Diagnosis not present

## 2019-05-09 DIAGNOSIS — K219 Gastro-esophageal reflux disease without esophagitis: Secondary | ICD-10-CM | POA: Diagnosis not present

## 2019-05-09 DIAGNOSIS — D120 Benign neoplasm of cecum: Secondary | ICD-10-CM

## 2019-05-09 DIAGNOSIS — K317 Polyp of stomach and duodenum: Secondary | ICD-10-CM | POA: Diagnosis not present

## 2019-05-09 DIAGNOSIS — K920 Hematemesis: Secondary | ICD-10-CM

## 2019-05-09 DIAGNOSIS — K449 Diaphragmatic hernia without obstruction or gangrene: Secondary | ICD-10-CM | POA: Diagnosis not present

## 2019-05-09 MED ORDER — SODIUM CHLORIDE 0.9 % IV SOLN: 500.0000 mL | Freq: Once | INTRAVENOUS | Status: DC

## 2019-05-09 NOTE — Op Note (Signed)
[place] Patient Name: Jordan [name] Procedure Date: 05/09/2019 2:50 PM MRN: [id number] Endoscopist: Remo Lipps P. Havery Moros , MD Age: 59 Referring MD:  Date of Birth: 1960-10-19 Gender: Female Account #: [id number] Procedure:                Colonoscopy Indications:              Follow-up of diverticulitis noted on prior CT Scan                            08/2018 Medicines:                Monitored Anesthesia Care Procedure:                Pre-Anesthesia Assessment:                           - Prior to the procedure, a History and Physical                            was performed, and patient medications and                            allergies were reviewed. The patient's tolerance of                            previous anesthesia was also reviewed. The risks                            and benefits of the procedure and the sedation                            options and risks were discussed with the patient.                            All questions were answered, and informed consent                            was obtained. Prior Anticoagulants: The patient has                            taken no previous anticoagulant or antiplatelet                            agents. ASA Grade Assessment: II - A patient with                            mild systemic disease. After reviewing the risks                            and benefits, the patient was deemed in                            satisfactory condition to undergo the procedure.  After obtaining informed consent, the colonoscope                            was passed under direct vision. Throughout the                            procedure, the patient's blood pressure, pulse, and                            oxygen saturations were monitored continuously. The                            Colonoscope was introduced through the anus and                            advanced to the the cecum, identified by                     appendiceal orifice and ileocecal valve. The                            colonoscopy was performed without difficulty. The                            patient tolerated the procedure well. The quality                            of the bowel preparation was good. The terminal                            ileum, ileocecal valve, appendiceal orifice, and                            rectum were photographed. Scope In: 3:02:37 PM Scope Out: 3:22:26 PM Scope Withdrawal Time: 0 hours 15 minutes 43 seconds  Total Procedure Duration: 0 hours 19 minutes 49 seconds  Findings:                 The perianal and digital rectal examinations were                            normal.                           The terminal ileum appeared normal.                           A 3 mm polyp was found in the cecum. The polyp was                            sessile. The polyp was removed with a cold snare.                            Resection and retrieval were complete.  Multiple small-mouthed diverticula were found in                            the sigmoid colon.                           The exam was otherwise without abnormality. Complications:            No immediate complications. Estimated blood loss:                            Minimal. Estimated Blood Loss:     Estimated blood loss was minimal. Impression:               - The examined portion of the ileum was normal.                           - One 3 mm polyp in the cecum, removed with a cold                            snare. Resected and retrieved.                           - Diverticulosis in the sigmoid colon.                           - The examination was otherwise normal.                           Patient does have diverticulosis which correlates                            to prior diagnosis of diverticulitis in the sigmoid                            colon. Recommendation:           - Patient has a contact number  available for                            emergencies. The signs and symptoms of potential                            delayed complications were discussed with the                            patient. Return to normal activities tomorrow.                            Written discharge instructions were provided to the                            patient.                           - Resume previous diet.                           -  Continue present medications.                           - Await pathology results. Remo Lipps P. Juanito Gonyer, MD 05/09/2019 3:29:30 PM This report has been signed electronically.

## 2019-05-09 NOTE — Patient Instructions (Signed)
Impression/Recommendations:  Hiatal hernia handout given to patient. Polyp handout given to patient. Diverticulosis handout given to patient.  Resume previous diet. Continue present medications. Await pathology results.  YOU HAD AN ENDOSCOPIC PROCEDURE TODAY AT [facility]:   Refer to the procedure report that was given to you for any specific questions about what was found during the examination.  If the procedure report does not answer your questions, please call your gastroenterologist to clarify.  If you requested that your care partner not be given the details of your procedure findings, then the procedure report has been included in a sealed envelope for you to review at your convenience later.  YOU SHOULD EXPECT: Some feelings of bloating in the abdomen. Passage of more gas than usual.  Walking can help get rid of the air that was put into your GI tract during the procedure and reduce the bloating. If you had a lower endoscopy (such as a colonoscopy or flexible sigmoidoscopy) you may notice spotting of blood in your stool or on the toilet paper. If you underwent a bowel prep for your procedure, you may not have a normal bowel movement for a few days.  Please Note:  You might notice some irritation and congestion in your nose or some drainage.  This is from the oxygen used during your procedure.  There is no need for concern and it should clear up in a day or so.  SYMPTOMS TO REPORT IMMEDIATELY:   Following lower endoscopy (colonoscopy or flexible sigmoidoscopy):  Excessive amounts of blood in the stool  Significant tenderness or worsening of abdominal pains  Swelling of the abdomen that is new, acute  Fever of 100F or higher   Following upper endoscopy (EGD)  Vomiting of blood or coffee ground material  New chest pain or pain under the shoulder blades  Painful or persistently difficult swallowing  New shortness of breath  Fever of 100F or higher  Black,  tarry-looking stools  For urgent or emergent issues, a gastroenterologist can be reached at any hour by calling [phone]. Do not use MyChart messaging for urgent concerns.    DIET:  We do recommend a small meal at first, but then you may proceed to your regular diet.  Drink plenty of fluids but you should avoid alcoholic beverages for 24 hours.  ACTIVITY:  You should plan to take it easy for the rest of today and you should NOT DRIVE or use heavy machinery until tomorrow (because of the sedation medicines used during the test).    FOLLOW UP: Our staff will call the number listed on your records 48-72 hours following your procedure to check on you and address any questions or concerns that you may have regarding the information given to you following your procedure. If we do not reach you, we will leave a message.  We will attempt to reach you two times.  During this call, we will ask if you have developed any symptoms of COVID 19. If you develop any symptoms (ie: fever, flu-like symptoms, shortness of breath, cough etc.) before then, please call [phone].  If you test positive for Covid 19 in the 2 weeks post procedure, please call and report this information to [place].    If any biopsies were taken you will be contacted by phone or by letter within the next 1-3 weeks.  Please call us at [phone] if you have not heard about the biopsies in 3 weeks.    SIGNATURES/CONFIDENTIALITY: You  and/or your care partner have signed paperwork which will be entered into your electronic medical record.  These signatures attest to the fact that that the information above on your After Visit Summary has been reviewed and is understood.  Full responsibility of the confidentiality of this discharge information lies with you and/or your care-partner.

## 2019-05-09 NOTE — Progress Notes (Signed)
A and O x3. Report to RN. Tolerated MAC anesthesia well.Teeth unchanged after procedure.

## 2019-05-09 NOTE — Progress Notes (Signed)
[place]

## 2019-05-09 NOTE — Progress Notes (Signed)
Called to room to assist during endoscopic procedure.  Patient ID and intended procedure confirmed with present staff. Received instructions for my participation in the procedure from the performing physician.  

## 2019-05-09 NOTE — Op Note (Signed)
[facility] Patient Name: Jordan [name] Procedure Date: 05/09/2019 2:50 PM MRN: [id number] Endoscopist: Remo Lipps P. Havery Moros , MD Age: 59 Referring MD:  Date of Birth: [birth year] Gender: Female Account #: [id number] Procedure:                Upper GI endoscopy Indications:              Follow-up of gastro-esophageal reflux disease -                            persistent regurgitation despite medical therapy,                            history of Hematemesis, history of H pylori Medicines:                Monitored Anesthesia Care Procedure:                Pre-Anesthesia Assessment:                           - Prior to the procedure, a History and Physical                            was performed, and patient medications and                            allergies were reviewed. The patient's tolerance of                            previous anesthesia was also reviewed. The risks                            and benefits of the procedure and the sedation                            options and risks were discussed with the patient.                            All questions were answered, and informed consent                            was obtained. Prior Anticoagulants: The patient has                            taken no previous anticoagulant or antiplatelet                            agents. ASA Grade Assessment: II - A patient with                            mild systemic disease. After reviewing the risks                            and benefits, the patient was deemed in  satisfactory condition to undergo the procedure.                           After obtaining informed consent, the endoscope was                            passed under direct vision. Throughout the                            procedure, the patient's blood pressure, pulse, and                            oxygen saturations were monitored continuously. The                            Endoscope  was introduced through the mouth, and                            advanced to the second part of duodenum. The upper                            GI endoscopy was accomplished without difficulty.                            The patient tolerated the procedure well. Scope In: Scope Out: Findings:                 Esophagogastric landmarks were identified: the                            Z-line was found at 33 cm, the gastroesophageal                            junction was found at 33 cm and the upper extent of                            the gastric folds was found at 38 cm from the                            incisors.                           A 5 cm hiatal hernia was present.                           The exam of the esophagus was otherwise normal. No                            Barrett's esophagus.                           A single 3 mm sessile polyp was found in the  gastric body. The polyp was removed with a cold                            biopsy forceps. Resection and retrieval were                            complete.                           The exam of the stomach was otherwise normal.                           Biopsies were taken with a cold forceps in the                            gastric body, at the incisura and in the gastric                            antrum for Helicobacter pylori testing.                           The duodenal bulb and second portion of the                            duodenum were normal. Complications:            No immediate complications. Estimated blood loss:                            Minimal. Estimated Blood Loss:     Estimated blood loss was minimal. Impression:               - Esophagogastric landmarks identified.                           - 5 cm hiatal hernia.                           - Normal esophagus otherwise                           - A single gastric polyp, benign appearing.                            Resected and  retrieved.                           - Normal stomach otherwise - biopsies taken for H                            pylori for eradication testing post therapy                           - Normal duodenal bulb and second portion of the  duodenum. Recommendation:           - Patient has a contact number available for                            emergencies. The signs and symptoms of potential                            delayed complications were discussed with the                            patient. Return to normal activities tomorrow.                            Written discharge instructions were provided to the                            patient.                           - Resume previous diet.                           - Continue present medications.                           - Await pathology results.                           - Will discuss with patient surgical consultation                            to repair hernia given refractory symptoms Jordan Pistole P. Evian Salguero, MD 05/09/2019 3:34:08 PM This report has been signed electronically.

## 2019-05-13 ENCOUNTER — Telehealth: Payer: Self-pay

## 2019-05-13 ENCOUNTER — Telehealth: Payer: Self-pay | Admitting: *Deleted

## 2019-05-13 NOTE — Telephone Encounter (Signed)
  Follow up Call-  Call back number 05/09/2019  Post procedure Call Back phone  # [phone]  Permission to leave phone message Yes  Some recent data might be hidden    See previous TE from today- LMOM to call back

## 2019-05-13 NOTE — Telephone Encounter (Signed)
No answer, left message to call back later today, B.Kamari Buch RN. 

## 2019-05-13 NOTE — Telephone Encounter (Signed)
Pt states, "I'm just tired.  I usually have trouble waking up after sedation."  But, I feel fine- no pain.

## 2019-06-09 ENCOUNTER — Other Ambulatory Visit: Payer: Self-pay

## 2019-06-09 ENCOUNTER — Other Ambulatory Visit: Payer: Self-pay | Admitting: Gastroenterology

## 2019-07-09 ENCOUNTER — Telehealth: Payer: Self-pay | Admitting: Gastroenterology

## 2019-07-09 NOTE — Telephone Encounter (Signed)
Patient reports constipation and back pain.  Back pain is usually improved with a BM.  She is asked to try Miralax BID and call back if this fails to improve her pain/ constipation

## 2019-08-05 ENCOUNTER — Encounter: Payer: Self-pay | Admitting: Nurse Practitioner

## 2019-08-05 ENCOUNTER — Ambulatory Visit: Payer: Medicare Other | Admitting: Nurse Practitioner

## 2019-08-05 VITALS — BP 110/72 | HR 72 | Ht 63.0 in | Wt 154.4 lb

## 2019-08-05 DIAGNOSIS — M545 Low back pain, unspecified: Secondary | ICD-10-CM

## 2019-08-05 DIAGNOSIS — K5900 Constipation, unspecified: Secondary | ICD-10-CM | POA: Diagnosis not present

## 2019-08-05 MED ORDER — LUBIPROSTONE 8 MCG PO CAPS: 8.0000 ug | ORAL_CAPSULE | Freq: Two times a day (BID) | ORAL | 3 refills | Status: DC

## 2019-08-05 NOTE — Progress Notes (Signed)
IMPRESSION and PLAN:   59 yo female with PMH significant for hyperlipidemia, GERD, large hiatal hernia, diverticulitis, fibromyalgia, seizures, anxiety, depression  # Left low back pain --localized tenderness over right posterior iliac crest --Etiology of pain unclear, possibly musculoskeletal.  It does get worse with constipation, alleviated by bowel movements --The first thing to do is treat constipation and see where we are with the pain  # Constipation --Drinking about 16 ounces of water daily.  Needs to increase to 64 ounces daily --We will hold off on prescribing fiber given that she complains of bloating --Having to take several doses of MiraLAX daily.  Will try Amitiza 8 mcg twice daily and stop MiraLAX --Follow up with Dr. Havery Moros (I think she already has an appointment scheduled to discuss surgical evaluation of hiatal hernia)  HPI:    Primary GI: Denning Cellar, MD   Chief complaint : back pain  04/13/19 patient contacted the office with symptoms of diverticulitis.  CT scan early March negative for diverticulitis (see full report below)  05/07/19 office visit with Dr. Havery Moros for persistent upper GI symptoms including reflux/regurgitation, occasional nausea and vomiting.  CT scan showed large hiatal hernia.  EGD and scheduled, see results below    HISTORY SINCE LAST VISIT: Patient says she has been in contact with her PCP and Dr. Havery Moros about left back pain .  She has been having having excruciating pain in left  back for months.  The pain is nearly constant, it is not positional nor related to bending or twisting. Heating pad helps the pain.  She has struggled with constipation.  Started taking MiraLAX 2-3 times a day which gives her several bowel movements a day but they are small. Defecation relieves the left lower back pain. She has not had any unusual weight loss but in fact has gained weight. Feels bloated.  No urinary symptoms other than  frequency but she drinks a lot of water.  No dysuria.  No vaginal symptoms.  She tried some sort of the counter patch but it did not help . She is not having any abdominal pain so she is not taking the dicyclomine nor Carafate at present. She says PCP wanted her to have an MRI of the pancreas but we didn't feel it was necessary at this point since CT scan in March of this year did not show any pancreatic lesions. Labs with [facility] on 06/03/19 showed normal WBC, normal hgb, Cr 1.06, LFTs normal.     Previous Endoscopic Evaluations:  February 2020 gastric emptying study normal  04/21/19 CT scan  abd/ pelvis with contrast IMPRESSION: 1. Moderate to large hiatal hernia, paraesophageal and sliding type, with no signs of stranding about the herniated stomach. This is new when compared to the previous examination. 2. No evidence of acute intra-abdominal pathology.   05/09/19 colonoscopy -The examined portion of the ileum was normal. - One 3 mm polyp in the cecum, removed with a cold snare. Resected and retrieved. - Diverticulosis in the sigmoid colon. - The examination was otherwise normal  05/09/19  EGD -Esophagogastric landmarks identified. - 5 cm hiatal hernia. - Normal esophagus otherwise - A single gastric polyp, benign appearing. Resected and retrieved. - Normal stomach otherwise - biopsies taken for H pylori for eradication testing post therapy - Normal duodenal bulb and second portion of the duodenum.   Review of systems:     No chest pain, no SOB, no fevers, no urinary  sx   Past Medical History:  Diagnosis Date  . Acute upper respiratory infections of unspecified site   . ADD (attention deficit disorder)   . Allergic rhinitis due to pollen   . Anal fissure   . Anemia, unspecified   . Anxiety disorder   . Asthma   . Chicken pox   . Complication of anesthesia    wakes up slow  . Depressive disorder, not elsewhere classified   . Diffuse cystic mastopathy   . Falls   .  Fibromyalgia   . GERD (gastroesophageal reflux disease)   . Headache(784.0)   . History of anorexia nervosa   . History of bulimia   . IBS (irritable bowel syndrome)   . 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 Lump or mass in breast   . Meningitis, unspecified(322.9)   . Other specified glaucoma   . Perforation of tympanic membrane, unspecified   . Radial styloid tenosynovitis   . Seizures ([place])     Patient's surgical history, family medical history, social history, medications and allergies were all reviewed in Epic   Creatinine clearance cannot be calculated (Patient's most recent lab result is older than the maximum 21 days allowed.)  Current Outpatient Medications  Medication Sig Dispense Refill  . ALPRAZolam (XANAX) 0.5 MG tablet Take 0.5 mg by mouth at bedtime as needed. For sleep.  Also 1/2 tab during the day.    Marland Kitchen atomoxetine (STRATTERA) 100 MG capsule Take 100 mg by mouth daily.    . Brexpiprazole (REXULTI) 1 MG TABS Take 1 tablet by mouth at bedtime.     . cyclobenzaprine (FLEXERIL) 10 MG tablet Take 10 mg by mouth at bedtime as needed.     . dicyclomine (BENTYL) 10 MG capsule Take 1 capsule (10 mg total) by mouth every 8 (eight) hours as needed for spasms. 60 capsule 1  . DULoxetine (CYMBALTA) 60 MG capsule Take by mouth 2 (two) times daily.     Marland Kitchen esomeprazole (NEXIUM) 40 MG capsule Take 1 capsule (40 mg total) by mouth 2 (two) times daily before a meal. 180 capsule 1  . fluticasone (FLONASE) 50 MCG/ACT nasal spray Place 1 spray into both nostrils daily as needed for allergies or rhinitis.    . Multiple Vitamin (MULTIVITAMIN) tablet Take 1 tablet by mouth daily.    . ondansetron (ZOFRAN-ODT) 4 MG disintegrating tablet Take 1 tablet (4 mg total) by mouth every 8 (eight) hours as needed for nausea or vomiting. 60 tablet 1  . sucralfate (CARAFATE) 1 g tablet Take 1 tablet (1 g total) by mouth every 6 (six) hours as needed. Slowly dissolve 1 tablet in 1 Tablespoon of distilled water prior  to taking 60 tablet 3  . traZODone (DESYREL) 100 MG tablet Take 100 mg by mouth at bedtime.     No current facility-administered medications for this visit.    Physical Exam:     BP 110/72   Pulse 72   Ht 5\' 3"  (1.6 m)   Wt 154 lb 6 oz (70 kg)   BMI 27.35 kg/m   GENERAL:  Pleasant female in NAD PSYCH: : Cooperative, normal affect CARDIAC:  RRR PULM: Normal respiratory effort, lungs CTA bilaterally, no wheezing ABDOMEN:  Nondistended, soft, nontender. No obvious masses, no hepatomegaly,  normal bowel sounds SKIN:  turgor, no lesions seen Musculoskeletal:  Normal muscle tone, normal strength. Localized tenderness over left posterior iliac crest NEURO: Alert and oriented x 3, no focal neurologic deficits   Tye Savoy ,  NP 08/05/2019, 11:58 AM

## 2019-08-05 NOTE — Patient Instructions (Addendum)
If you are age 59 or older, your body mass index should be between 23-30. Your Body mass index is 27.35 kg/m. If this is out of the aforementioned range listed, please consider follow up with your Primary Care Provider.  If you are age 82 or younger, your body mass index should be between 19-25. Your Body mass index is 27.35 kg/m. If this is out of the aformentioned range listed, please consider follow up with your Primary Care Provider.   START Lubiprostone (Amitiza) 8 mg 1 capsule 2 times a day.   Increase your water intake to 64 ounces daily.  You have been scheduled to Follow up with Dr. Havery Moros on October 06, 2019 at 1:40 pm.

## 2019-08-06 ENCOUNTER — Telehealth: Payer: Self-pay | Admitting: Nurse Practitioner

## 2019-08-06 NOTE — Progress Notes (Signed)
Agree with assessment as outlined. Sounds like this may be musculoskeletal but will treat constipation and see if she gets better. If bowels improved but pain persists she should follow up with her PCP for the back pain

## 2019-08-06 NOTE — Telephone Encounter (Signed)
Left message to return call 

## 2019-08-08 NOTE — Telephone Encounter (Signed)
The patient returned called and stated that pharmacy was trying to fill Amitiza for her instead of generic which was what she could not afford. She stated that they did switch it to generic which we was able to afford.

## 2019-08-08 NOTE — Telephone Encounter (Signed)
Left voicemail for patient to return phone call 

## 2019-09-04 ENCOUNTER — Other Ambulatory Visit: Payer: Self-pay

## 2019-09-04 ENCOUNTER — Ambulatory Visit (INDEPENDENT_AMBULATORY_CARE_PROVIDER_SITE_OTHER): Payer: Medicare Other | Admitting: Otolaryngology

## 2019-09-04 ENCOUNTER — Encounter (INDEPENDENT_AMBULATORY_CARE_PROVIDER_SITE_OTHER): Payer: Self-pay | Admitting: Otolaryngology

## 2019-09-04 VITALS — Temp 97.3°F

## 2019-09-04 DIAGNOSIS — H9201 Otalgia, right ear: Secondary | ICD-10-CM | POA: Diagnosis not present

## 2019-09-04 NOTE — Progress Notes (Signed)
HPI: Jordan [name] is a 59 y.o. female who returns today for evaluation of right ear complaints.  She has had chronic right ear problems for a number of years and has had previous surgery with Dr. Cynda Familia and Dr. Ernesto Rutherford.  She does not hear as well from her right ear but more recently has been having some abnormal sensations down in the ear canal but no active drainage noted.  She feels like there might be a hair in the ear canal..  Past Medical History:  Diagnosis Date  . Acute upper respiratory infections of unspecified site   . ADD (attention deficit disorder)   . Allergic rhinitis due to pollen   . Anal fissure   . Anemia, unspecified   . Anxiety disorder   . Asthma   . Chicken pox   . Complication of anesthesia    wakes up slow  . Depressive disorder, not elsewhere classified   . Diffuse cystic mastopathy   . Falls   . Fibromyalgia   . GERD (gastroesophageal reflux disease)   . Headache(784.0)   . History of anorexia nervosa   . History of bulimia   . IBS (irritable bowel syndrome)   . 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 Lump or mass in breast   . Meningitis, unspecified(322.9)   . Other specified glaucoma   . Perforation of tympanic membrane, unspecified   . Radial styloid tenosynovitis   . Seizures ([place])    Past Surgical History:  Procedure Laterality Date  . ADENOIDECTOMY    . ANKLE SURGERY  1992   right ankle - scar tissue  . DILATION AND CURETTAGE OF UTERUS  2006   History of Menometrorrhagia  . [place] to 1986   6 surgeries on right ear  . LAPAROSCOPY  1993   normal  . REDUCTION MAMMAPLASTY Bilateral 2018  . uterine ablation  2011  . WRIST SURGERY  2001   for de-quervaine - bilateral wrists - tendonitis   Social History   Socioeconomic History  . Marital status: Divorced    Spouse name: Not on file  . Number of children: 0  . Years of education: Masters  . Highest education level: Not on file  Occupational History  . Occupation: part time  Designer, industrial/product  Tobacco Use  . Smoking status: Never Smoker  . Smokeless tobacco: Never Used  Vaping Use  . Vaping Use: Never used  Substance and Sexual Activity  . Alcohol use: Yes    Alcohol/week: 7.0 standard drinks    Types: 7 Glasses of wine per week  . Drug use: No  . Sexual activity: Not on file  Other Topics Concern  . Not on file  Social History Narrative   [employer], [employer] BA, App for MA. [place]. 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 Married 5 years - divorced, no children   Patient is right handed   Patient drinks 1 cup of coffee daily.               Social Determinants of Health   Financial Resource Strain:   . Difficulty of Paying Living Expenses:   Food Insecurity:   . Worried About Charity fundraiser in the Last Year:   . Arboriculturist in the Last Year:   Transportation Needs:   . Film/video editor (Medical):   Marland Kitchen Lack of Transportation (  Non-Medical):   Physical Activity:   . Days of Exercise per Week:   . Minutes of Exercise per Session:   Stress:   . Feeling of Stress :   Social Connections:   . Frequency of Communication with Friends and Family:   . Frequency of Social Gatherings with Friends and Family:   . Attends Religious Services:   . Active Member of Clubs or Organizations:   . Attends Archivist Meetings:   Marland Kitchen Marital Status:    Family History  Problem Relation Age of Onset  . Ovarian cancer Mother   . Heart disease Mother   . Hypertension Mother   . Coronary artery disease Mother   . Colon polyps Mother 13  . Heart failure Father   . Hypertension Father   . Hyperlipidemia Father   . Diabetes Father   . Breast cancer Maternal Grandmother   . Colon cancer Neg Hx   . Esophageal cancer Neg Hx   . Stomach cancer Neg Hx   . Rectal cancer Neg Hx    Allergies  Allergen Reactions  . Mirtazapine Other (See Comments)    Severe hallucinations   Prior  to Admission medications   Medication Sig Start Date End Date Taking? Authorizing Provider  ALPRAZolam Duanne Moron) 0.5 MG tablet Take 0.5 mg by mouth at bedtime as needed. For sleep.  Also 1/2 tab during the day.   Yes [provider]  atomoxetine (STRATTERA) 100 MG capsule Take 100 mg by mouth daily.   Yes [provider]  Brexpiprazole (REXULTI) 1 MG TABS Take 1 tablet by mouth at bedtime.    Yes [provider]  cyclobenzaprine (FLEXERIL) 10 MG tablet Take 10 mg by mouth at bedtime as needed.  01/06/14  Yes [provider]  dicyclomine (BENTYL) 10 MG capsule Take 1 capsule (10 mg total) by mouth every 8 (eight) hours as needed for spasms. 04/14/19  Yes Armbruster, Carlota Raspberry, MD  DULoxetine (CYMBALTA) 60 MG capsule Take by mouth 2 (two) times daily.    Yes [provider]  esomeprazole (NEXIUM) 40 MG capsule Take 1 capsule (40 mg total) by mouth 2 (two) times daily before a meal. 06/09/19  Yes Armbruster, Carlota Raspberry, MD  fluticasone (FLONASE) 50 MCG/ACT nasal spray Place 1 spray into both nostrils daily as needed for allergies or rhinitis.   Yes [provider]  lubiprostone (AMITIZA) 8 MCG capsule Take 1 capsule (8 mcg total) by mouth 2 (two) times daily with a meal. 08/05/19  Yes Willia Craze, NP  Multiple Vitamin (MULTIVITAMIN) tablet Take 1 tablet by mouth daily.   Yes [provider]  ondansetron (ZOFRAN-ODT) 4 MG disintegrating tablet Take 1 tablet (4 mg total) by mouth every 8 (eight) hours as needed for nausea or vomiting. 05/07/19  Yes Armbruster, Carlota Raspberry, MD  sucralfate (CARAFATE) 1 g tablet Take 1 tablet (1 g total) by mouth every 6 (six) hours as needed. Slowly dissolve 1 tablet in 1 Tablespoon of distilled water prior to taking 05/07/19  Yes Armbruster, Carlota Raspberry, MD  traZODone (DESYREL) 100 MG tablet Take 100 mg by mouth at bedtime.   Yes [provider]     Positive ROS: Otherwise negative  All other systems have  been reviewed and were otherwise negative with the exception of those mentioned in the HPI and as above.  Physical Exam: Constitutional: Alert, well-appearing, no acute distress Ears: External ears without lesions or tenderness.  Left ear canal and TM are  clear.  Right ear canal is clear she has a mild amount of scar tissue involving the right TM but no evidence of infection with intact TM.  On tuning fork testing she heard better in the left ear than the right but AC > BC bilaterally.  I applied gentian violet and CSF powder to the right ear canal although there is no signs of infection. Nasal: External nose without lesions. Clear nasal passages Oral: Lips and gums without lesions. Tongue and palate mucosa without lesions. Posterior oropharynx clear. Neck: No palpable adenopathy or masses Respiratory: Breathing comfortably  Skin: No facial/neck lesions or rash noted.  Procedures  Assessment: Right ear discomfort I suspect is probably more related to previous surgeries and hearing loss as there is no signs of infection or abnormalities within the ear canal.  Plan: We will plan on scheduling an audiogram to further evaluate his hearing as she has had no hearing test in the past 10 years.   Radene Journey, MD

## 2019-09-09 ENCOUNTER — Other Ambulatory Visit: Payer: Self-pay | Admitting: Gastroenterology

## 2019-09-15 ENCOUNTER — Other Ambulatory Visit (INDEPENDENT_AMBULATORY_CARE_PROVIDER_SITE_OTHER): Payer: Self-pay

## 2019-09-15 MED ORDER — CIPROFLOXACIN-DEXAMETHASONE 0.3-0.1 % OT SUSP
4.0000 [drp] | Freq: Two times a day (BID) | OTIC | 0 refills | Status: AC
Start: 2019-09-15 — End: 2019-09-20

## 2019-09-25 ENCOUNTER — Encounter (INDEPENDENT_AMBULATORY_CARE_PROVIDER_SITE_OTHER): Payer: Self-pay | Admitting: Otolaryngology

## 2019-09-25 ENCOUNTER — Other Ambulatory Visit: Payer: Self-pay

## 2019-09-25 ENCOUNTER — Ambulatory Visit (INDEPENDENT_AMBULATORY_CARE_PROVIDER_SITE_OTHER): Payer: Medicare Other | Admitting: Otolaryngology

## 2019-09-25 VITALS — Temp 97.9°F

## 2019-09-25 DIAGNOSIS — H9071 Mixed conductive and sensorineural hearing loss, unilateral, right ear, with unrestricted hearing on the contralateral side: Secondary | ICD-10-CM

## 2019-09-25 NOTE — Progress Notes (Signed)
HPI: Jordan [name] is a 59 y.o. female who returns today for evaluation of right ear complaints.  Her ear is feeling better overall although she still has a sensation of blockage or something in the ear.  She presents today to have audiologic testing.  I reviewed the hearing test with her in the office today..  Past Medical History:  Diagnosis Date  . Acute upper respiratory infections of unspecified site   . ADD (attention deficit disorder)   . Allergic rhinitis due to pollen   . Anal fissure   . Anemia, unspecified   . Anxiety disorder   . Asthma   . Chicken pox   . Complication of anesthesia    wakes up slow  . Depressive disorder, not elsewhere classified   . Diffuse cystic mastopathy   . Falls   . Fibromyalgia   . GERD (gastroesophageal reflux disease)   . Headache(784.0)   . History of anorexia nervosa   . History of bulimia   . IBS (irritable bowel syndrome)   . 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 Lump or mass in breast   . Meningitis, unspecified(322.9)   . Other specified glaucoma   . Perforation of tympanic membrane, unspecified   . Radial styloid tenosynovitis   . Seizures ([place])    Past Surgical History:  Procedure Laterality Date  . ADENOIDECTOMY    . ANKLE SURGERY  1992   right ankle - scar tissue  . DILATION AND CURETTAGE OF UTERUS  2006   History of Menometrorrhagia  . [place] to 1986   6 surgeries on right ear  . LAPAROSCOPY  1993   normal  . REDUCTION MAMMAPLASTY Bilateral 2018  . uterine ablation  2011  . WRIST SURGERY  2001   for de-quervaine - bilateral wrists - tendonitis   Social History   Socioeconomic History  . Marital status: Divorced    Spouse name: Not on file  . Number of children: 0  . Years of education: Masters  . Highest education level: Not on file  Occupational History  . Occupation: part time Designer, industrial/product  Tobacco Use  . Smoking status: Never Smoker  . Smokeless tobacco: Never Used  Vaping Use  . Vaping Use:  Never used  Substance and Sexual Activity  . Alcohol use: Yes    Alcohol/week: 7.0 standard drinks    Types: 7 Glasses of wine per week  . Drug use: No  . Sexual activity: Not on file  Other Topics Concern  . Not on file  Social History Narrative   HS[employer], [employer] BA, App for MA. [place], roads[place] rail [place] land. 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 Married 5 years - divorced, no children   Patient is right handed   Patient drinks 1 cup of coffee daily.               Social Determinants of Health   Financial Resource Strain:   . Difficulty of Paying Living Expenses:   Food Insecurity:   . Worried About Charity fundraiser in the Last Year:   . Arboriculturist in the Last Year:   Transportation Needs:   . Film/video editor (Medical):   Marland Kitchen Lack of Transportation (Non-Medical):   Physical Activity:   . Days of Exercise per Week:   . Minutes of Exercise per Session:   Stress:   .  Feeling of Stress :   Social Connections:   . Frequency of Communication with Friends and Family:   . Frequency of Social Gatherings with Friends and Family:   . Attends Religious Services:   . Active Member of Clubs or Organizations:   . Attends Archivist Meetings:   Marland Kitchen Marital Status:    Family History  Problem Relation Age of Onset  . Ovarian cancer Mother   . Heart disease Mother   . Hypertension Mother   . Coronary artery disease Mother   . Colon polyps Mother 70  . Heart failure Father   . Hypertension Father   . Hyperlipidemia Father   . Diabetes Father   . Breast cancer Maternal Grandmother   . Colon cancer Neg Hx   . Esophageal cancer Neg Hx   . Stomach cancer Neg Hx   . Rectal cancer Neg Hx    Allergies  Allergen Reactions  . Mirtazapine Other (See Comments)    Severe hallucinations   Prior to Admission medications   Medication Sig Start Date End Date Taking? Authorizing Provider  ALPRAZolam Duanne Moron) 0.5 MG  tablet Take 0.5 mg by mouth at bedtime as needed. For sleep.  Also 1/2 tab during the day.   Yes [provider]  atomoxetine (STRATTERA) 100 MG capsule Take 100 mg by mouth daily.   Yes [provider]  Brexpiprazole (REXULTI) 1 MG TABS Take 1 tablet by mouth at bedtime.    Yes [provider]  cyclobenzaprine (FLEXERIL) 10 MG tablet Take 10 mg by mouth at bedtime as needed.  01/06/14  Yes [provider]  dicyclomine (BENTYL) 10 MG capsule TAKE 1 CAPSULE (10 MG TOTAL) BY MOUTH EVERY 8 (EIGHT) HOURS AS NEEDED FOR SPASMS. 09/09/19  Yes Armbruster, Carlota Raspberry, MD  DULoxetine (CYMBALTA) 60 MG capsule Take by mouth 2 (two) times daily.    Yes [provider]  esomeprazole (NEXIUM) 40 MG capsule Take 1 capsule (40 mg total) by mouth 2 (two) times daily before a meal. 06/09/19  Yes Armbruster, Carlota Raspberry, MD  fluticasone (FLONASE) 50 MCG/ACT nasal spray Place 1 spray into both nostrils daily as needed for allergies or rhinitis.   Yes [provider]  lubiprostone (AMITIZA) 8 MCG capsule Take 1 capsule (8 mcg total) by mouth 2 (two) times daily with a meal. 08/05/19  Yes Willia Craze, NP  Multiple Vitamin (MULTIVITAMIN) tablet Take 1 tablet by mouth daily.   Yes [provider]  ondansetron (ZOFRAN-ODT) 4 MG disintegrating tablet Take 1 tablet (4 mg total) by mouth every 8 (eight) hours as needed for nausea or vomiting. 05/07/19  Yes Armbruster, Carlota Raspberry, MD  sucralfate (CARAFATE) 1 g tablet Take 1 tablet (1 g total) by mouth every 6 (six) hours as needed. Slowly dissolve 1 tablet in 1 Tablespoon of distilled water prior to taking 05/07/19  Yes Armbruster, Carlota Raspberry, MD  traZODone (DESYREL) 100 MG tablet Take 100 mg by mouth at bedtime.   Yes [provider]     Positive ROS: Otherwise negative  All other systems have been reviewed and were otherwise negative with the exception of those mentioned in the HPI and as above.  Physical  Exam: Constitutional: Alert, well-appearing, no acute distress Ears: External ears without lesions or tenderness.  Right ear canal is clear there is still little bit of gentian violet within the ear canal.  There does not appear to be any inflammatory changes of the ear canal.  The  TM is intact and mobile but has a fair amount of scar tissue from previous surgery.  No middle ear space abnormality noted.  I did reapply gentian violet to the right ear along with CSF powder. Nasal: External nose without lesions. Clear nasal passages Oral: Lips and gums without lesions. Tongue and palate mucosa without lesions. Posterior oropharynx clear. Neck: No palpable adenopathy or masses Respiratory: Breathing comfortably  Skin: No facial/neck lesions or rash noted.  Procedures  Audiologic testing demonstrated a moderate to severe mixed hearing loss in the right ear with SRT of 50 dB on the right side.  She had borderline normal hearing on the left side with SRT of 20 dB on the left.  She had type A tympanograms bilaterally.  Assessment: Right ear mixed hearing loss status post previous surgery on the right side  Plan: Recommended obtaining a hearing aid for the right ear. No signs of infection. She will follow-up as needed.   Radene Journey, MD

## 2019-09-26 ENCOUNTER — Encounter (INDEPENDENT_AMBULATORY_CARE_PROVIDER_SITE_OTHER): Payer: Self-pay

## 2019-10-06 ENCOUNTER — Ambulatory Visit: Payer: Medicare Other | Admitting: Gastroenterology

## 2019-10-07 ENCOUNTER — Ambulatory Visit: Payer: Medicare Other | Admitting: Family Medicine

## 2019-11-06 ENCOUNTER — Ambulatory Visit: Payer: Medicare Other | Admitting: Family Medicine

## 2019-11-06 DIAGNOSIS — Z0289 Encounter for other administrative examinations: Secondary | ICD-10-CM

## 2019-11-20 ENCOUNTER — Other Ambulatory Visit: Payer: Self-pay

## 2019-11-20 ENCOUNTER — Ambulatory Visit (INDEPENDENT_AMBULATORY_CARE_PROVIDER_SITE_OTHER): Payer: Medicare Other | Admitting: Cardiology

## 2019-11-20 ENCOUNTER — Encounter: Payer: Self-pay | Admitting: Cardiology

## 2019-11-20 VITALS — BP 125/71 | HR 82 | Temp 94.3°F | Ht 63.0 in | Wt 151.8 lb

## 2019-11-20 DIAGNOSIS — Z8249 Family history of ischemic heart disease and other diseases of the circulatory system: Secondary | ICD-10-CM

## 2019-11-20 DIAGNOSIS — Z7189 Other specified counseling: Secondary | ICD-10-CM

## 2019-11-20 DIAGNOSIS — R55 Syncope and collapse: Secondary | ICD-10-CM | POA: Diagnosis not present

## 2019-11-20 NOTE — Patient Instructions (Signed)
Medication Instructions:  Your Physician recommend you continue on your current medication as directed.    *If you need a refill on your cardiac medications before your next appointment, please call your pharmacy*   Lab Work: None ordered   Testing/Procedures: None ordered    Follow-Up: At [facility], you and your health needs are our priority.  As part of our continuing mission to provide you with exceptional heart care, we have created designated Provider Care Teams.  These Care Teams include your primary Cardiologist (physician) and Advanced Practice Providers (APPs -  Physician Assistants and Nurse Practitioners) who all work together to provide you with the care you need, when you need it.  We recommend signing up for the patient portal called "MyChart".  Sign up information is provided on this After Visit Summary.  MyChart is used to connect with patients for Virtual Visits (Telemedicine).  Patients are able to view lab/test results, encounter notes, upcoming appointments, etc.  Non-urgent messages can be sent to your provider as well.   To learn more about what you can do with MyChart, go to [email or web address].    Your next appointment:   As needed  The format for your next appointment:   In Person  Provider:   Bridgette Christopher, MD    

## 2019-11-20 NOTE — Progress Notes (Signed)
Cardiology Office Note:    Date:  11/20/2019   ID:  Jordan [name], DOB 1960/06/23, MRN [id number]  PCP:  Bernerd Limbo, MD  Cardiologist:  Buford Dresser, MD  Referring MD: Bernerd Limbo, MD   CC: cardiology consultation for syncope/second opinion  History of Present Illness:    Jordan [name] is a 59 y.o. female with a hx of anxiety, depression, panic disorder, fibromyalgia, seizures who is seen as a new consult at the request of Bernerd Limbo, MD for the evaluation and management of syncope.  Note from Dr. Coletta Memos dated 06/03/19 reviewed. Noted to have shakiness, sweatiness, and fatigue 1-2 hours after eating. Occurs daily, improves with orange juice or other food intake. Had one episode of syncope, noted as vasovagal. Had prodrome, got herself to the floor prior to losing consciousness. She was referred to cardiology.   She was seen by Dr. Geanie Berlin 06/17/19 for initial consult . Reviewed in [address]. Seen for follow up 08/11/19, reported feeling better, no further episodes. Noted to snore, feel tired in AM, referred for sleep study.  She called 10/07/19 to request a second opinion with [facility].  Today: Here for second opinions. Wants to know what low voltage means on ECG. Has put on weight with Covid, despite being very active. Both of her parents had heart disease. Wants to make sure she is healthy.  Cardiovascular risk factors: Prior clinical ASCVD: may have had mini-strokes, has some small areas in her brain, unclear cause (not multiple sclerosis) in 2013 Comorbid conditions: Denies hypertension, hyperlipidemia, diabetes, chronic kidney disease Metabolic syndrome/Obesity: BMI 26 Chronic inflammatory conditions: none Tobacco use history: never smoker Family history: Father had CHF, DM, obesity, CAD s/p multiple bypass surgeries, died of CHF at age 55. Mother had CAD s/p stent, mild MI. Died of blood clot, had cervical cancer. Mat Gma had heart issues in her 90s.  Sister is recovered alcoholic, no other health issues. Prior cardiac testing and/or incidental findings on other testing (ie coronary calcium): stress echo 07/29/19 ([facility]) Exercise level: runs sprints, plays basketball Current diet: limited diet, but healthy. Lean proteins, whole grains, lots of fruits/vegetables. 1-2 glasses of wine/night. Cautious about sugars. No longer drinks sodas. One cup of coffee/day.  Reviewed prior episode. Denies that she ever had full loss of consciousness. Had come into appt in April for back pain. No further dizziness/lightheadedness. Denies chest pain, shortness of breath at rest or with normal exertion. No PND, orthopnea, LE edema or unexpected weight gain.   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    Lump or mass in breast    Meningitis, unspecified(322.9)    Other specified glaucoma    Perforation of tympanic membrane, unspecified    Radial styloid tenosynovitis    Seizures ([place])     Past Surgical History:  Procedure Laterality Date   ADENOIDECTOMY     ANKLE SURGERY  1992   right ankle - scar tissue   DILATION AND CURETTAGE OF UTERUS  2006   History of Menometrorrhagia   INNER EAR SURGERY  1966 to 1986   6 surgeries on right ear   LAPAROSCOPY  1993   normal   REDUCTION MAMMAPLASTY Bilateral 2018   uterine ablation  2011   WRIST SURGERY  2001   for de-quervaine - bilateral  wrists - tendonitis    Current Medications: Current Outpatient Medications on File Prior to Visit  Medication Sig   ALPRAZolam (XANAX) 0.5 MG tablet Take 0.5 mg by mouth at bedtime as needed. For sleep.  Also 1/2 tab during the day.   Brexpiprazole (REXULTI) 1 MG TABS Take 1 tablet by mouth at bedtime.    cyclobenzaprine (FLEXERIL) 10 MG tablet Take 10 mg by mouth at bedtime as needed.    dicyclomine (BENTYL) 10 MG capsule TAKE 1 CAPSULE (10 MG TOTAL) BY MOUTH EVERY 8 (EIGHT) HOURS AS NEEDED FOR SPASMS.   DULoxetine (CYMBALTA) 60 MG capsule Take by mouth 2 (two) times daily.    esomeprazole (NEXIUM) 40 MG capsule Take 1 capsule (40 mg total) by mouth 2 (two) times daily before a meal.   fluticasone (FLONASE) 50 MCG/ACT nasal spray Place 1 spray into both nostrils daily as needed for allergies or rhinitis.   lubiprostone (AMITIZA) 8 MCG capsule Take 1 capsule (8 mcg total) by mouth 2 (two) times daily with a meal.   Multiple Vitamin (MULTIVITAMIN) tablet Take 1 tablet by mouth daily.   ondansetron (ZOFRAN-ODT) 4 MG disintegrating tablet Take 1 tablet (4 mg total) by mouth every 8 (eight) hours as needed for nausea or vomiting.   sucralfate (CARAFATE) 1 g tablet Take 1 tablet (1 g total) by mouth every 6 (six) hours as needed. Slowly dissolve 1 tablet in 1 Tablespoon of distilled water prior to taking   traZODone (DESYREL) 100 MG tablet Take 100 mg by mouth at bedtime.   No current facility-administered medications on file prior to visit.     Allergies:   Mirtazapine   Social History   Tobacco Use   Smoking status: Never Smoker   Smokeless tobacco: Never Used  Vaping Use   Vaping Use: Never used  Substance Use Topics   Alcohol use: Yes    Alcohol/week: 7.0 standard drinks    Types: 7 Glasses of wine per week   Drug use: No    Family History: family history includes Breast cancer in her maternal grandmother; Colon polyps (age of onset: 55) in her mother; Coronary  artery disease in her mother; Diabetes in her father; Heart disease in her mother; Heart failure in her father; Hyperlipidemia in her father; Hypertension in her father and mother; Ovarian cancer in her mother. There is no history of Colon cancer, Esophageal cancer, Stomach cancer, or Rectal cancer.  ROS:   Please see the history of present illness.  Additional pertinent ROS: Constitutional: Negative for chills, fever, night sweats, unintentional weight loss  HENT: Negative for ear pain and hearing loss.   Eyes: Negative for loss of vision and eye pain.  Respiratory: Negative for cough, sputum, wheezing.   Cardiovascular: See HPI. Gastrointestinal: Negative for abdominal pain, melena, and hematochezia.  Genitourinary: Negative for dysuria and hematuria.  Musculoskeletal: Negative for falls and myalgias.  Skin: Negative for itching and rash.  Neurological: Negative for focal weakness, focal sensory changes and loss of consciousness.  Endo/Heme/Allergies: Does not bruise/bleed easily.     EKGs/Labs/Other Studies Reviewed:    The following studies were reviewed today: Stress echo 6.8.21 ([facility]) Post-stress: The left ventricle systolic function is hyperdynamic  post-stress with an EF of 70-75 %. The post-stress echo showed normal wall  motion which was hyperdynamic compared to baseline.   Stress ECG: ECG Conclusion: No ischemic ST segment changes occurred  with stress. Patient developed atypical chest pain at peak stress at right  upper chest close to shoulder. Chest pain lasted 10 seconds and got  resolved by itself went treadmill slowed down. Doubt angina. An  exagerated hypertensive BP response with max BP 210/80. Good functional  capacity with estimated workload 9.7 METS which is above average  functional capacity for age and gender.   Post-stress Impression: The study is normal. This study shows a low  prognostic risk. TheECG and Echo portions of the stress study are    concordant with no evidence of inducible myocardial ischemia.   Left Ventricle: Systolic function is normal. EF: 65-67%. Doppler  parameters indicate normal diastolic function.   9.7 Mets achieved  No significant valvular disease noted.  Monitor 5.18.21 ([facility]) Patient was wearing extended Holter monitor for 9 days 15 hours and 9 minutes from April 27 until Jun 27, 2019.   Indication: Palpitations.   Patient remained in sinus rhythm with minimum heart rate 64 bpm at 2:42 AM on day 6, maximum heart rate in sinus rhythm 127 bpm at 2:07 AM on day 5.   One episode of bradycardia noted with bradycardia burden less than 0.01%.   774 episodes of tachycardia noted with tachycardia burden 10.54%.   3092 isolated PVCs noted with PVC burden 0.26%.  Only 851 supraventricular ectopic beats noted.   5 beat narrow complex tachycardia likely represented pentaplet of PACs at 2:07 AM on May 1 with heart rate 136 bpm. SVT is less likely.  3 beat run of irregular narrow complex tachycardia with heart rate 132 bpm that 12:40 AM on May 4 likely represented triplet of PACs. SVT is less likely.   There are no patient triggered events.   The longest R to R interval 0.95 seconds at 2:42 AM on May 2.   No A. fib, no VT, no long pauses 3 seconds or more noted.  EKG:  EKG is personally reviewed.  The ekg ordered today demonstrates NSR at 82 bpm, borderline low voltage  Recent Labs: No results found for requested labs within last 8760 hours.  Recent Lipid Panel    Component Value Date/Time   CHOL 210 (H) 08/15/2013 1020   TRIG 248.0 (H) 08/15/2013 1020   HDL 54.70 08/15/2013 1020   CHOLHDL 4 08/15/2013 1020   VLDL 49.6 (H) 08/15/2013 1020   LDLCALC 106 (H) 08/15/2013 1020    Physical Exam:    VS:  BP 125/71    Pulse 82    Temp (!) 94.3 F (34.6 C)    Ht 5\' 3"  (1.6 m)    Wt 151 lb 12.8 oz (68.9 kg)    SpO2 96%    BMI 26.89 kg/m     Wt Readings from Last 3 Encounters:  11/20/19 151 lb 12.8  oz (68.9 kg)  08/05/19 154 lb 6 oz (70 kg)  05/09/19 153 lb (69.4 kg)    GEN: Well nourished, well developed in no acute distress HEENT: Normal, moist mucous membranes NECK: No JVD CARDIAC: regular rhythm, normal S1 and S2, no rubs or gallops. No murmurs. VASCULAR:  Radial and DP pulses 2+ bilaterally. No carotid bruits RESPIRATORY:  Clear to auscultation without rales, wheezing or rhonchi  ABDOMEN: Soft, non-tender, non-distended MUSCULOSKELETAL:  Ambulates independently SKIN: Warm and dry, no edema NEUROLOGIC:  Alert and oriented x 3. No focal neuro deficits noted. PSYCHIATRIC:  Normal affect    ASSESSMENT:    1. Near syncope   2. Family history of heart disease   3. Cardiac risk counseling   4. Counseling on health promotion and disease prevention    PLAN:    Near syncope: denies full syncope. No further symptoms since several months ago -ECG unremarkable. We did discuss what low voltage means, she was told by Dr. Doreatha Lew in the past that she had "a small heart." Not concerning -had stress echo, monitor at [facility]. Reviewed as above -counseled on red flag warning signs that need immediate medical attention  Family history of heart disease -we reviewed her risk at length -her BMI is 26, but she is very active and eats a healthy diet. Weight gain is a struggle on her brexpiprazole, but this is working well for her -discussed calcium score as additional risk factor evaluation. She will consider and contact me if interested. -discussed how plaque forms, how we manage risk  Cardiac risk counseling and prevention recommendations: -recommend heart healthy/Mediterranean diet, with whole grains, fruits, vegetable, fish, lean meats, nuts, and olive oil. Limit salt. -recommend moderate walking, 3-5 times/week for 30-50 minutes each session. Aim for at least 150 minutes.week. Goal should be pace of 3 miles/hours, or walking 1.5 miles in 30 minutes -recommend avoidance of tobacco products.  Avoid excess alcohol.  Plan for follow up: I would be happy to see her back as needed  Buford Dresser, MD, PhD [place]   [facility]    Medication Adjustments/Labs and Tests Ordered: Current medicines are reviewed at length with the patient today.  Concerns regarding medicines are outlined above.  Orders Placed This Encounter  Procedures   EKG 12-Lead   No orders of the defined types were placed in this encounter.   Patient Instructions  Medication Instructions:  Your Physician recommend you continue on your current medication as directed.    *If you need a refill on your cardiac medications before your next appointment, please call your pharmacy*   Lab Work: None ordered  Testing/Procedures: None ordered    Follow-Up: At [facility], you and your health needs are our priority.  As part of our continuing mission to provide you with exceptional heart care, we have created designated Provider Care Teams.  These Care Teams include your primary Cardiologist (physician) and Advanced Practice Providers (APPs -  Physician Assistants and Nurse Practitioners) who all work together to provide you with the care you need, when you need it.  We recommend signing up for the patient portal called "MyChart".  Sign up information is provided on this After Visit Summary.  MyChart is used to connect with patients for Virtual Visits (Telemedicine).  Patients are able to view lab/test results, encounter notes, upcoming appointments, etc.  Non-urgent messages can be sent to your provider as well.   To learn more about what you can do with MyChart, go to [email or web address].    Your next appointment:   As needed  The format for your next appointment:   In Person  Provider:   Buford Dresser, MD       Signed, Buford Dresser, MD PhD 11/20/2019 5:02 PM    [place]

## 2019-12-04 ENCOUNTER — Ambulatory Visit: Payer: Medicare Other | Admitting: Nurse Practitioner

## 2019-12-04 ENCOUNTER — Encounter: Payer: Self-pay | Admitting: Nurse Practitioner

## 2019-12-04 VITALS — BP 114/70 | HR 88 | Ht 62.0 in | Wt 151.2 lb

## 2019-12-04 DIAGNOSIS — M545 Low back pain, unspecified: Secondary | ICD-10-CM

## 2019-12-04 NOTE — Progress Notes (Signed)
ASSESSMENT AND PLAN    # Left low back pain since ~ March 2021 --Only temporary relief with defecation. She has left lower back pain tenderness on exam. No abdominal pain. This is unlikely GI related pain  --Labs, CT scan negative. Lumbar films show mild sclerosis and narrowing of the facet joints of L3-4 L4-5 and L5-S1 --PCP referring to PT, treating with Mobic. She couldn't tolerate MRI due to claustrophobia.  --She has a GYN appt soon. No obvious GYN abnormalities on CT scan in March.  --Hopefully physical therapy will help  # Constipation, significantly improved with Amitiza --Continue Amitiza.    # Large Hiatal hernia, paraesophageal and sliding type.  --She had a conversation with Dr. Redmond Pulling but  never did get surgical repair, wants to wait until after COVID pandemic.  --No GERD sx, abdominal discomfort, etc  HISTORY OF PRESENT ILLNESS     Primary Gastroenterologist : [place] Cellar, MD  Chief Complaint : left low back pain  Jordan [name] is a 59 y.o. female with PMH / [place] significant for,  but not necessarily limited to: GERD, large hiatal hernia, diverticulitis, fibromyalgia, seizures, anxiety / depression  Patient seen in June for left low back pain felt to be musculoskeletal . It got worse with constipation so we prescribed a bowel purge and Amitiza. Her bowels are definitely moving better on Amitiza. She also takes bentyl twice daily. Bowel movements do help the left low back pain though post defecation the pain recurs within a couple of hours. The low back has actually gotten worse since I last saw her.  No urinary symptoms. She is going to see GYN in two weeks. Her weight is stable.  Her PCP ordered MRI of lower back but she couldn't do it because of being claustrophobic. PCP therefore got a plain xray of her back which she says was normal.  PCP referred her to physical therapy, her appt is next week. I referred lumbar spine xray results in Care Everywhere.      Previous Endoscopic Evaluations / Pertinent Studies:   12/01/19 XR lumbar spine:  Five lumbar type vertebrae are present.  Vertebral body heights are maintained without fracture.  Normal disc space heights. Mild endplate spurring.  Normal alignment.  Mild sclerosis and narrowing of the facet joints of L3-4 L4-5 and L5-S1.  Soft tissues are intact.  05/09/19 colonoscopy -The examined portion of the ileum was normal. - One 3 mm polyp in the cecum, removed with a cold snare. Resected and retrieved. - Diverticulosis in the sigmoid colon. - The examination was otherwise normal   04/21/19 CT scan w/ contrast scattered colonic diverticulosis, normal appearance of uterus / adnexa. No musculoskeletal findings. Diverticulosis and small tubular adenoma on colonoscopy in March 2021    Past Medical History:  Diagnosis Date  . Acute upper respiratory infections of unspecified site   . ADD (attention deficit disorder)   . Allergic rhinitis due to pollen   . Anal fissure   . Anemia, unspecified   . Anxiety disorder   . Asthma   . Chicken pox   . Complication of anesthesia    wakes up slow  . Depressive disorder, not elsewhere classified   . Diffuse cystic mastopathy   . Falls   . Fibromyalgia   . GERD (gastroesophageal reflux disease)   . Headache(784.0)   . History of anorexia nervosa   . History of bulimia   . IBS (irritable bowel syndrome)   . 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 Lump or mass in breast   . Meningitis, unspecified(322.9)   . Other specified glaucoma   . Perforation of tympanic membrane, unspecified   . Radial styloid tenosynovitis   . Seizures ([place])     Current Medications, Allergies, Past Surgical History, Family History and Social History were reviewed in [employer] record.   Current Outpatient Medications  Medication Sig Dispense Refill  . ALPRAZolam (XANAX) 0.5 MG tablet Take 0.5 mg by mouth at bedtime as needed. For sleep.  Also 1/2  tab during the day.    . Armodafinil 250 MG tablet Take 1 tablet by mouth daily.    . Brexpiprazole (REXULTI) 1 MG TABS Take 1 tablet by mouth. Every 5 days    . cyclobenzaprine (FLEXERIL) 10 MG tablet Take 10 mg by mouth at bedtime as needed.     . dicyclomine (BENTYL) 10 MG capsule TAKE 1 CAPSULE (10 MG TOTAL) BY MOUTH EVERY 8 (EIGHT) HOURS AS NEEDED FOR SPASMS. 60 capsule 1  . DULoxetine (CYMBALTA) 60 MG capsule Take by mouth 2 (two) times daily.     Marland Kitchen esomeprazole (NEXIUM) 40 MG capsule Take 1 capsule (40 mg total) by mouth 2 (two) times daily before a meal. 180 capsule 1  . fluticasone (FLONASE) 50 MCG/ACT nasal spray Place 1 spray into both nostrils daily as needed for allergies or rhinitis.    Marland Kitchen ibuprofen (ADVIL) 800 MG tablet Take 1 tablet by mouth every 12 (twelve) hours as needed.    . lubiprostone (AMITIZA) 8 MCG capsule Take 1 capsule (8 mcg total) by mouth 2 (two) times daily with a meal. 60 capsule 3  . meloxicam (MOBIC) 15 MG tablet Take 15 mg by mouth daily.    . Multiple Vitamin (MULTIVITAMIN) tablet Take 1 tablet by mouth daily.    . ondansetron (ZOFRAN-ODT) 4 MG disintegrating tablet Take 1 tablet (4 mg total) by mouth every 8 (eight) hours as needed for nausea or vomiting. 60 tablet 1  . polyethylene glycol powder (GLYCOLAX/MIRALAX) 17 GM/SCOOP powder Take 17 g by mouth daily.    . sucralfate (CARAFATE) 1 g tablet Take 1 tablet (1 g total) by mouth every 6 (six) hours as needed. Slowly dissolve 1 tablet in 1 Tablespoon of distilled water prior to taking 60 tablet 3  . traZODone (DESYREL) 100 MG tablet Take 100 mg by mouth at bedtime.     No current facility-administered medications for this visit.    Review of Systems: No chest pain. No shortness of breath. No urinary complaints.   PHYSICAL EXAM :    Wt Readings from Last 3 Encounters:  12/04/19 151 lb 4 oz (68.6 kg)  11/20/19 151 lb 12.8 oz (68.9 kg)  08/05/19 154 lb 6 oz (70 kg)    Ht 5\' 2"  (1.575 m) Comment:  height measured without shoes  Wt 151 lb 4 oz (68.6 kg)   BMI 27.66 kg/m  Constitutional:  Pleasant female in no acute distress. Psychiatric: Normal mood and affect. Behavior is normal. EENT: Pupils normal.  Conjunctivae are normal. No scleral icterus. Neck supple.  Cardiovascular: Normal rate, regular rhythm. No edema Pulmonary/chest: Effort normal and breath sounds normal. No wheezing, rales or rhonchi. Abdominal: Soft, nondistended, nontender. Bowel sounds active throughout. There are no masses palpable. No hepatomegaly. Musculoskeletal: left lower back tenderness to palpation  Neurological: Alert and oriented to person place and time. Skin: Skin is warm and dry. No rashes noted.  Jordan Savoy, Jordan [name]  12/04/2019, 11:46 AM  I spent 30 minutes total reviewing records, obtaining history, performing exam, counseling patient and documenting visit / findings.   Bernerd Limbo, MD

## 2019-12-04 NOTE — Patient Instructions (Signed)
Continue Amitiza  Please follow up as needed

## 2019-12-05 ENCOUNTER — Encounter: Payer: Self-pay | Admitting: Nurse Practitioner

## 2019-12-06 NOTE — Progress Notes (Signed)
Agree with assessment and plan as outlined.  

## 2019-12-13 ENCOUNTER — Other Ambulatory Visit: Payer: Self-pay | Admitting: Gastroenterology

## 2019-12-18 ENCOUNTER — Other Ambulatory Visit: Payer: Self-pay | Admitting: Obstetrics and Gynecology

## 2019-12-18 DIAGNOSIS — M25552 Pain in left hip: Secondary | ICD-10-CM

## 2019-12-20 ENCOUNTER — Ambulatory Visit
Admission: RE | Admit: 2019-12-20 | Discharge: 2019-12-20 | Disposition: A | Discharge status: Home / Self Care | Payer: Medicare Other | Source: Ambulatory Visit | Attending: Obstetrics and Gynecology | Admitting: Obstetrics and Gynecology

## 2019-12-20 ENCOUNTER — Other Ambulatory Visit: Payer: Self-pay

## 2019-12-20 DIAGNOSIS — M25552 Pain in left hip: Secondary | ICD-10-CM

## 2019-12-26 LAB — HM PAP SMEAR: HPV, high-risk: NEGATIVE

## 2019-12-31 ENCOUNTER — Other Ambulatory Visit: Payer: Self-pay | Admitting: Orthopedic Surgery

## 2019-12-31 DIAGNOSIS — M545 Low back pain, unspecified: Secondary | ICD-10-CM

## 2020-01-11 ENCOUNTER — Other Ambulatory Visit: Payer: Medicare Other

## 2020-01-13 ENCOUNTER — Other Ambulatory Visit: Payer: Self-pay | Admitting: Nurse Practitioner

## 2020-01-19 ENCOUNTER — Other Ambulatory Visit: Payer: Medicare Other

## 2020-01-20 ENCOUNTER — Other Ambulatory Visit: Payer: Self-pay

## 2020-01-21 ENCOUNTER — Telehealth: Payer: Self-pay | Admitting: Gastroenterology

## 2020-01-21 ENCOUNTER — Other Ambulatory Visit: Payer: Self-pay

## 2020-01-21 ENCOUNTER — Ambulatory Visit (INDEPENDENT_AMBULATORY_CARE_PROVIDER_SITE_OTHER): Payer: Medicare Other | Admitting: Internal Medicine

## 2020-01-21 ENCOUNTER — Encounter: Payer: Self-pay | Admitting: Internal Medicine

## 2020-01-21 VITALS — BP 110/70 | HR 90 | Temp 98.6°F | Ht 63.0 in | Wt 149.4 lb

## 2020-01-21 DIAGNOSIS — F339 Major depressive disorder, recurrent, unspecified: Secondary | ICD-10-CM

## 2020-01-21 DIAGNOSIS — M797 Fibromyalgia: Secondary | ICD-10-CM | POA: Diagnosis not present

## 2020-01-21 DIAGNOSIS — M545 Low back pain, unspecified: Secondary | ICD-10-CM | POA: Diagnosis not present

## 2020-01-21 DIAGNOSIS — F410 Panic disorder [episodic paroxysmal anxiety] without agoraphobia: Secondary | ICD-10-CM | POA: Diagnosis not present

## 2020-01-21 NOTE — Telephone Encounter (Signed)
Pt sent a mychart message stating the same as below. This has been sent to Dr. Havery Moros.

## 2020-01-21 NOTE — Patient Instructions (Signed)
-  Nice seeing you today!!  -PT referral. Back exercises daily.  -Schedule follow up in 3 months for your physical. Please come in fasting that day.

## 2020-01-21 NOTE — Addendum Note (Signed)
Addended by: Westley Hummer B on: 01/21/2020 02:51 PM   Modules accepted: Orders

## 2020-01-21 NOTE — Progress Notes (Signed)
New Patient Office Visit     This visit occurred during the SARS-CoV-2 public health emergency.  Safety protocols were in place, including screening questions prior to the visit, additional usage of staff PPE, and extensive cleaning of exam room while observing appropriate contact time as indicated for disinfecting solutions.    CC/Reason for Visit: Establish care, discuss chronic conditions and acute concern Previous PCP: Dr. Bernerd Limbo, MD Last Visit: Earlier this year  HPI: Jordan [name] is a 59 y.o. female who is coming in today for the above mentioned reasons. Past Medical History is significant for: Depression, anxiety and ADHD managed by psychiatry, she is disabled due to her mental health issues.  She had a uterine ablation in 2011 due to dysfunctional uterine bleeding.  Since March 2021 she has had this unexplainable left-sided lower back pain that is paraspinal.  She has had an MRI of her hip, colonoscopy all without concrete etiology for her pain.  She is concerned because her mother had ovarian and colon cancer.  She saw her GYN who told her that her ovaries were clear.  She recently saw Dr. Gladstone Lighter with orthopedics and apparently an MRI of her lumbar spine has been ordered.  She has follow-up with GI later this month as well, it appears that a CT scan of the abdomen is also planned.  She has been referred to physical therapy but never went.  Her vaccinations are up-to-date with the exception of shingles.  Cancer screening is up-to-date, her mammogram will be done later this week.  She is overdue for a physical.   Past Medical/Surgical History: Past Medical History:  Diagnosis Date  . Acute upper respiratory infections of unspecified site   . ADD (attention deficit disorder)   . Allergic rhinitis due to pollen   . Anal fissure   . Anemia, unspecified   . Anxiety disorder   . Asthma   . Chicken pox   . Complication of anesthesia    wakes up slow  .  Depressive disorder, not elsewhere classified   . Diffuse cystic mastopathy   . Falls   . Fibromyalgia   . GERD (gastroesophageal reflux disease)   . Headache(784.0)   . History of anorexia nervosa   . History of bulimia   . IBS (irritable bowel syndrome)   . 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 Lump or mass in breast   . Meningitis, unspecified(322.9)   . Other specified glaucoma   . Perforation of tympanic membrane, unspecified   . Radial styloid tenosynovitis   . Seizures ([place])     Past Surgical History:  Procedure Laterality Date  . ADENOIDECTOMY    . ANKLE SURGERY  1992   right ankle - scar tissue  . DILATION AND CURETTAGE OF UTERUS  2006   History of Menometrorrhagia  . [place] to 1986   6 surgeries on right ear  . LAPAROSCOPY  1993   normal  . REDUCTION MAMMAPLASTY Bilateral 2018  . uterine ablation  2011  . WRIST SURGERY  2001   for de-quervaine - bilateral wrists - tendonitis    Social History:  reports that she has never smoked. She has never used smokeless tobacco. She reports current alcohol use of about 7.0 standard drinks of alcohol per week. She reports that she does not use drugs.  Allergies: Allergies  Allergen Reactions  . Pregabalin Other (See Comments)    Delusions  . Remeron [Mirtazapine] Other (See  Comments)    Severe hallucinations    Family History:  Family History  Problem Relation Age of Onset  . Ovarian cancer Mother   . Heart disease Mother   . Hypertension Mother   . Coronary artery disease Mother   . Colon polyps Mother 68  . Heart failure Father   . Hypertension Father   . Hyperlipidemia Father   . Diabetes Father   . Breast cancer Maternal Grandmother   . Colon cancer Neg Hx   . Esophageal cancer Neg Hx   . Stomach cancer Neg Hx   . Rectal cancer Neg Hx      Current Outpatient Medications:  .  ALPRAZolam (XANAX) 0.5 MG tablet, Take 0.5 mg by mouth at bedtime as needed. For sleep.  Also 1/2 tab during the day.,  Disp: , Rfl:  .  Armodafinil 250 MG tablet, Take 1 tablet by mouth daily., Disp: , Rfl:  .  Brexpiprazole (REXULTI) 1 MG TABS, Take 1 tablet by mouth. Every 5 days, Disp: , Rfl:  .  cyclobenzaprine (FLEXERIL) 10 MG tablet, Take 10 mg by mouth at bedtime as needed. , Disp: , Rfl:  .  dicyclomine (BENTYL) 10 MG capsule, TAKE 1 CAPSULE (10 MG TOTAL) BY MOUTH EVERY 8 (EIGHT) HOURS AS NEEDED FOR SPASMS., Disp: 60 capsule, Rfl: 1 .  DULoxetine (CYMBALTA) 60 MG capsule, Take by mouth 2 (two) times daily. , Disp: , Rfl:  .  esomeprazole (NEXIUM) 40 MG capsule, TAKE 1 CAPSULE (40 MG TOTAL) BY MOUTH 2 (TWO) TIMES DAILY BEFORE A MEAL., Disp: 180 capsule, Rfl: 1 .  fluticasone (FLONASE) 50 MCG/ACT nasal spray, Place 1 spray into both nostrils daily as needed for allergies or rhinitis., Disp: , Rfl:  .  ibuprofen (ADVIL) 800 MG tablet, Take 1 tablet by mouth every 12 (twelve) hours as needed., Disp: , Rfl:  .  lubiprostone (AMITIZA) 8 MCG capsule, TAKE 1 CAPSULE (8 MCG TOTAL) BY MOUTH 2 (TWO) TIMES DAILY WITH A MEAL., Disp: 60 capsule, Rfl: 3 .  Multiple Vitamin (MULTIVITAMIN) tablet, Take 1 tablet by mouth daily., Disp: , Rfl:  .  ondansetron (ZOFRAN-ODT) 4 MG disintegrating tablet, Take 1 tablet (4 mg total) by mouth every 8 (eight) hours as needed for nausea or vomiting., Disp: 60 tablet, Rfl: 1 .  polyethylene glycol powder (GLYCOLAX/MIRALAX) 17 GM/SCOOP powder, Take 17 g by mouth daily., Disp: , Rfl:  .  sucralfate (CARAFATE) 1 g tablet, Take 1 tablet (1 g total) by mouth every 6 (six) hours as needed. Slowly dissolve 1 tablet in 1 Tablespoon of distilled water prior to taking, Disp: 60 tablet, Rfl: 3 .  traZODone (DESYREL) 100 MG tablet, Take 100 mg by mouth at bedtime., Disp: , Rfl:   Review of Systems:  Constitutional: Denies fever, chills, diaphoresis, appetite change and fatigue.  HEENT: Denies photophobia, eye pain, redness, hearing loss, ear pain, congestion, sore throat, rhinorrhea, sneezing, mouth  sores, trouble swallowing, neck pain, neck stiffness and tinnitus.   Respiratory: Denies SOB, DOE, cough, chest tightness,  and wheezing.   Cardiovascular: Denies chest pain, palpitations and leg swelling.  Gastrointestinal: Denies nausea, vomiting, abdominal pain, diarrhea, constipation, blood in stool and abdominal distention.  Genitourinary: Denies dysuria, urgency, frequency, hematuria, flank pain and difficulty urinating.  Endocrine: Denies: hot or cold intolerance, sweats, changes in hair or nails, polyuria, polydipsia. Musculoskeletal: Denies joint swelling, arthralgias and gait problem.  Skin: Denies pallor, rash and wound.  Neurological: Denies dizziness, seizures, syncope, weakness, light-headedness, numbness  and headaches.  Hematological: Denies adenopathy. Easy bruising, personal or family bleeding history  Psychiatric/Behavioral: Denies suicidal ideation, mood changes, confusion, nervousness, sleep disturbance and agitation    Physical Exam: Vitals:   01/21/20 1333  BP: 110/70  Pulse: 90  Temp: 98.6 F (37 C)  TempSrc: Oral  SpO2: 98%  Weight: 149 lb 6.4 oz (67.8 kg)  Height: 5\' 3"  (1.6 m)   Body mass index is 26.47 kg/m.   Constitutional: NAD, calm, comfortable Eyes: PERRL, lids and conjunctivae normal, wears corrective lenses ENMT: Mucous membranes are moist. Respiratory: clear to auscultation bilaterally, no wheezing, no crackles. Normal respiratory effort. No accessory muscle use.  Cardiovascular: Regular rate and rhythm, no murmurs / rubs / gallops. No extremity edema.  Abdomen: no tenderness, no masses palpated. No hepatosplenomegaly. Bowel sounds positive.  Musculoskeletal: no clubbing / cyanosis. No joint deformity upper and lower extremities. Good ROM, no contractures. Normal muscle tone.  Pain to deep palpation to the left lower paraspinal muscles, right above the gluteus maximus. Neurologic: Grossly intact and nonfocal Psychiatric: Normal judgment and  insight. Alert and oriented x 3. Normal mood.    Impression and Plan:  Panic disorder without agoraphobia Depression, recurrent ([place]) -Followed by psychiatry, on multiple psychotropic medications.  Back pain, lumbosacral Fibromyalgia -Based on her description and lack of concerning signs and symptoms, I suspect this pain is musculoskeletal in origin. -I have advised icing, as needed NSAIDs, back stretches as well as referral to physical therapy. -It appears orthopedics has already ordered an MRI of her lumbar spine and we will await those results.    Patient Instructions  -Nice seeing you today!!  -PT referral. Back exercises daily.  -Schedule follow up in 3 months for your physical. Please come in fasting that day.     Lelon Frohlich, MD [facility]

## 2020-01-21 NOTE — Telephone Encounter (Signed)
Called the patient this evening to address her abdominal pain. There was no answer, left message. I will call back tomorrow and try to address this for her.

## 2020-01-22 ENCOUNTER — Other Ambulatory Visit: Payer: Self-pay | Admitting: Gastroenterology

## 2020-01-22 DIAGNOSIS — R109 Unspecified abdominal pain: Secondary | ICD-10-CM

## 2020-01-22 NOTE — Telephone Encounter (Signed)
Noted  

## 2020-01-22 NOTE — Progress Notes (Signed)
cbc

## 2020-01-27 DIAGNOSIS — Z1231 Encounter for screening mammogram for malignant neoplasm of breast: Secondary | ICD-10-CM

## 2020-01-29 ENCOUNTER — Other Ambulatory Visit (INDEPENDENT_AMBULATORY_CARE_PROVIDER_SITE_OTHER): Payer: Medicare Other

## 2020-01-29 DIAGNOSIS — R109 Unspecified abdominal pain: Secondary | ICD-10-CM

## 2020-01-29 LAB — COMPREHENSIVE METABOLIC PANEL
ALT: 23 U/L (ref 0–35)
AST: 20 U/L (ref 0–37)
Albumin: 4.4 g/dL (ref 3.5–5.2)
Alkaline Phosphatase: 99 U/L (ref 39–117)
BUN: 13 mg/dL (ref 6–23)
CO2: 29 mEq/L (ref 19–32)
Calcium: 9.1 mg/dL (ref 8.4–10.5)
Chloride: 105 mEq/L (ref 96–112)
Creatinine, Ser: 0.93 mg/dL (ref 0.40–1.20)
GFR: 67.28 mL/min (ref >60.00)
Glucose, Bld: 85 mg/dL (ref 70–99)
Potassium: 4.7 mEq/L (ref 3.5–5.1)
Sodium: 139 mEq/L (ref 135–145)
Total Bilirubin: 0.3 mg/dL (ref 0.2–1.2)
Total Protein: 7.1 g/dL (ref 6.0–8.3)

## 2020-01-29 LAB — CBC WITH DIFFERENTIAL/PLATELET
Basophils Absolute: 0.1 10*3/uL (ref 0.0–0.1)
Basophils Relative: 0.9 % (ref 0.0–3.0)
Eosinophils Absolute: 0.7 10*3/uL (ref 0.0–0.7)
Eosinophils Relative: 9.8 % — ABNORMAL HIGH (ref 0.0–5.0)
HCT: 40.4 % (ref 36.0–46.0)
Hemoglobin: 13.4 g/dL (ref 12.0–15.0)
Lymphocytes Relative: 26.6 % (ref 12.0–46.0)
Lymphs Abs: 1.8 10*3/uL (ref 0.7–4.0)
MCHC: 33.3 g/dL (ref 30.0–36.0)
MCV: 87.9 fl (ref 78.0–100.0)
Monocytes Absolute: 0.7 10*3/uL (ref 0.1–1.0)
Monocytes Relative: 9.6 % (ref 3.0–12.0)
Neutro Abs: 3.6 10*3/uL (ref 1.4–7.7)
Neutrophils Relative %: 53.1 % (ref 43.0–77.0)
Platelets: 230 10*3/uL (ref 150.0–400.0)
RBC: 4.59 Mil/uL (ref 3.87–5.11)
RDW: 15.6 % — ABNORMAL HIGH (ref 11.5–15.5)
WBC: 6.8 10*3/uL (ref 4.0–10.5)

## 2020-01-30 ENCOUNTER — Encounter: Payer: Self-pay | Admitting: Internal Medicine

## 2020-01-31 ENCOUNTER — Encounter: Payer: Self-pay | Admitting: Internal Medicine

## 2020-02-07 ENCOUNTER — Other Ambulatory Visit: Payer: Self-pay | Admitting: Gastroenterology

## 2020-02-18 ENCOUNTER — Encounter: Payer: Self-pay | Admitting: Internal Medicine

## 2020-02-18 DIAGNOSIS — Z1231 Encounter for screening mammogram for malignant neoplasm of breast: Secondary | ICD-10-CM

## 2020-02-19 ENCOUNTER — Encounter: Payer: Self-pay | Admitting: Gastroenterology

## 2020-02-19 ENCOUNTER — Ambulatory Visit: Payer: Medicare Other | Admitting: Gastroenterology

## 2020-02-19 ENCOUNTER — Other Ambulatory Visit (INDEPENDENT_AMBULATORY_CARE_PROVIDER_SITE_OTHER): Payer: Medicare Other

## 2020-02-19 VITALS — BP 114/62 | HR 98 | Ht 63.0 in | Wt 151.0 lb

## 2020-02-19 DIAGNOSIS — R7989 Other specified abnormal findings of blood chemistry: Secondary | ICD-10-CM

## 2020-02-19 DIAGNOSIS — K219 Gastro-esophageal reflux disease without esophagitis: Secondary | ICD-10-CM

## 2020-02-19 DIAGNOSIS — M545 Low back pain, unspecified: Secondary | ICD-10-CM

## 2020-02-19 DIAGNOSIS — K5900 Constipation, unspecified: Secondary | ICD-10-CM | POA: Diagnosis not present

## 2020-02-19 LAB — CBC WITH DIFFERENTIAL/PLATELET
Basophils Absolute: 0.1 10*3/uL (ref 0.0–0.1)
Basophils Relative: 1 % (ref 0.0–3.0)
Eosinophils Absolute: 0.5 10*3/uL (ref 0.0–0.7)
Eosinophils Relative: 6.4 % — ABNORMAL HIGH (ref 0.0–5.0)
HCT: 32.8 % — ABNORMAL LOW (ref 36.0–46.0)
Hemoglobin: 11.4 g/dL — ABNORMAL LOW (ref 12.0–15.0)
Lymphocytes Relative: 27.1 % (ref 12.0–46.0)
Lymphs Abs: 2.2 10*3/uL (ref 0.7–4.0)
MCHC: 34.7 g/dL (ref 30.0–36.0)
MCV: 88.7 fl (ref 78.0–100.0)
Monocytes Absolute: 0.6 10*3/uL (ref 0.1–1.0)
Monocytes Relative: 6.8 % (ref 3.0–12.0)
Neutro Abs: 4.8 10*3/uL (ref 1.4–7.7)
Neutrophils Relative %: 58.7 % (ref 43.0–77.0)
Platelets: 227 10*3/uL (ref 150.0–400.0)
RBC: 3.7 Mil/uL — ABNORMAL LOW (ref 3.87–5.11)
RDW: 15.2 % (ref 11.5–15.5)
WBC: 8.2 10*3/uL (ref 4.0–10.5)

## 2020-02-19 MED ORDER — DICYCLOMINE HCL 10 MG PO CAPS: 10.0000 mg | ORAL_CAPSULE | Freq: Three times a day (TID) | ORAL | 2 refills | Status: DC | PRN

## 2020-02-19 NOTE — Progress Notes (Signed)
HPI :  59 year old female with a history of GERD, large hiatal hernia, history of diverticulitis, here for a follow-up visit.  Main complaint today is left lower back pain.  This is been going on since April.  She states previously was constant but now it is a little bit more intermittent. Left lower back is TTP. She thinks when she is constipated the pain gets worse and it can be relieved with a bowel movement fairly reliably.  Sitting bothers it, feels better with ambulation.  She generally has not been constipated if she has been taking MiraLAX once every morning.  Amitiza does provide benefit but she has not been able to take it due to the expense of the drug, she is applying for financial assistance for that.  In March when she was having abdominal pain we performed a CT scan of her abdomen pelvis to rule out diverticulitis and that was negative.  There was no pathology noted on that exam to account for her pain.  She also had a colonoscopy in March of this year which showed diverticulosis and one small adenoma, otherwise no other concerning pathology.  She has been concerned that her pain is related to her colon.  Bentyl can provide some benefit at times but she has no other abdominal pains.  No blood in her stools.  She does have some increased gas and bloating.  She has been taking Nexium 40 mg twice daily to control her reflux symptoms.  Currently this is working at this time but previously she was having a hard time with her reflux when I saw her in the spring.  She had an EGD with me in March showing a 5 cm hiatal hernia.  She was referred to see Dr. Andrey Campanile to consider hernia repair given her ongoing symptoms in the setting of PPI use but she declined in light of the Covid pandemic she wants to stay on the hospital if all possible.  She has been doing okay with her Nexium lately want to continue medical therapy for now.  She ultimately had imaging of her hip and her lower back with MRI. I  reviewed her hip MRI as well as the MRI of her lower back.  She has been seeing orthopedic surgery for this. She has what appears to be 2 disc bulges in her lumbar sacral spine with possible nerve compression.  Hip shows degenerative changes moderate peritendinosis with bursitis and an anterior superior labral tear of the left hip with moderate left hamstring tendinopathy and interstitial tears.  She received steroid course, prednisone, per her surgeon and states that definitely provided some benefit to her pain when she took it and she was in fact pain-free.  After she stopped it however her pain has come back but is not as severe as previous.  She is seeing to her orthopedic surgeon again next week and wonders if she needs to have her bowel reevaluated at all.  Otherwise she had a CBC which showed mild eosinophilia and is concerned she has a problem with her bone marrow causing that.  She denies any allergic symptoms currently.    Prior workup: EGD 05/24/2016 - 4cm HH, 54mm polyp removed at 30cm,  Colonoscopy 09/2013 - normal  GES 03/27/18 - normal  CT scan 04/21/19 - IMPRESSION: 1. Moderate to large hiatal hernia, paraesophageal and sliding type, with no signs of stranding about the herniated stomach. This is new when compared to the previous examination. 2. No evidence of acute  intra-abdominal pathology.   CT scan 09/04/18: [facility] - suspected sigmoid diverticulitis, moderate hiatal hernia   05/09/19 colonoscopy -The examined portion of the ileum was normal. - One 3 mm polyp in the cecum, removed with a cold snare. Resected and retrieved. - Diverticulosis in the sigmoid colon. - The examination was otherwise normal  05/09/19  EGD -Esophagogastric landmarks identified. - 5 cm hiatal hernia. - Normal esophagus otherwise - A single gastric polyp, benign appearing. Resected and retrieved. - Normal stomach otherwise - biopsies taken for H pylori for eradication testing post therapy -  Normal duodenal bulb and second portion of the duodenum.    Past Medical History:  Diagnosis Date  . Acute upper respiratory infections of unspecified site   . ADD (attention deficit disorder)   . Allergic rhinitis due to pollen   . Anal fissure   . Anemia, unspecified   . Anxiety disorder   . Asthma   . Chicken pox   . Complication of anesthesia    wakes up slow  . Depressive disorder, not elsewhere classified   . Diffuse cystic mastopathy   . Falls   . Fibromyalgia   . GERD (gastroesophageal reflux disease)   . Headache(784.0)   . History of anorexia nervosa   . History of bulimia   . IBS (irritable bowel syndrome)   . 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 Lump or mass in breast   . Meningitis, unspecified(322.9)   . Oral thrush   . Other specified glaucoma   . Perforation of tympanic membrane, unspecified   . Radial styloid tenosynovitis   . Seizures ([place])      Past Surgical History:  Procedure Laterality Date  . ADENOIDECTOMY    . ANKLE SURGERY  1992   right ankle - scar tissue  . DILATION AND CURETTAGE OF UTERUS  2006   History of Menometrorrhagia  . [place] to 1986   6 surgeries on right ear  . LAPAROSCOPY  1993   normal  . REDUCTION MAMMAPLASTY Bilateral 2018  . uterine ablation  2011  . WRIST SURGERY  2001   for de-quervaine - bilateral wrists - tendonitis   Family History  Problem Relation Age of Onset  . Ovarian cancer Mother   . Heart disease Mother   . Hypertension Mother   . Coronary artery disease Mother   . Colon polyps Mother 63  . Heart failure Father   . Hypertension Father   . Hyperlipidemia Father   . Diabetes Father   . Breast cancer Maternal Grandmother   . Colon cancer Neg Hx   . Esophageal cancer Neg Hx   . Stomach cancer Neg Hx   . Rectal cancer Neg Hx    Social History   Tobacco Use  . Smoking status: Never Smoker  . Smokeless tobacco: Never Used  Vaping Use  . Vaping Use: Never used  Substance Use Topics  .  Alcohol use: Yes    Alcohol/week: 7.0 standard drinks    Types: 7 Glasses of wine per week  . Drug use: No   Current Outpatient Medications  Medication Sig Dispense Refill  . ALPRAZolam (XANAX) 0.5 MG tablet Take 0.5 mg by mouth at bedtime as needed. For sleep.  Also 1/2 tab during the day.    . brexpiprazole (REXULTI) 1 MG TABS tablet Take 1 tablet by mouth. Every 5 days    . cyclobenzaprine (FLEXERIL) 10 MG tablet Take 10 mg by mouth at bedtime as  needed.     . dicyclomine (BENTYL) 10 MG capsule TAKE 1 CAPSULE (10 MG TOTAL) BY MOUTH EVERY 8 (EIGHT) HOURS AS NEEDED FOR SPASMS. 60 capsule 1  . DULoxetine (CYMBALTA) 60 MG capsule Take by mouth 2 (two) times daily.     Marland Kitchen esomeprazole (NEXIUM) 40 MG capsule TAKE 1 CAPSULE (40 MG TOTAL) BY MOUTH 2 (TWO) TIMES DAILY BEFORE A MEAL. 180 capsule 1  . fluconazole (DIFLUCAN) 150 MG tablet Take 150 mg by mouth every 3 (three) days.    . fluticasone (FLONASE) 50 MCG/ACT nasal spray Place 1 spray into both nostrils daily as needed for allergies or rhinitis.    Marland Kitchen ibuprofen (ADVIL) 800 MG tablet Take 1 tablet by mouth every 12 (twelve) hours as needed.    . lubiprostone (AMITIZA) 8 MCG capsule TAKE 1 CAPSULE (8 MCG TOTAL) BY MOUTH 2 (TWO) TIMES DAILY WITH A MEAL. 60 capsule 3  . Multiple Vitamin (MULTIVITAMIN) tablet Take 1 tablet by mouth daily.    . ondansetron (ZOFRAN-ODT) 4 MG disintegrating tablet Take 1 tablet (4 mg total) by mouth every 8 (eight) hours as needed for nausea or vomiting. 60 tablet 1  . polyethylene glycol powder (GLYCOLAX/MIRALAX) 17 GM/SCOOP powder Take 17 g by mouth daily.    . sucralfate (CARAFATE) 1 g tablet Take 1 tablet (1 g total) by mouth every 6 (six) hours as needed. Slowly dissolve 1 tablet in 1 Tablespoon of distilled water prior to taking 60 tablet 3  . traZODone (DESYREL) 50 MG tablet Take 100 mg by mouth at bedtime.     No current facility-administered medications for this visit.   Allergies  Allergen Reactions  .  Pregabalin Other (See Comments)    Delusions  . Remeron [Mirtazapine] Other (See Comments)    Severe hallucinations     Review of Systems: All systems reviewed and negative except where noted in HPI.   CBC Latest Ref Rng & Units 02/19/2020 01/29/2020 08/15/2013  WBC 4.0 - 10.5 K/uL 8.2 6.8 5.6  Hemoglobin 12.0 - 15.0 g/dL 11.4(L) 13.4 14.1  Hematocrit 36.0 - 46.0 % 32.8(L) 40.4 40.6  Platelets 150.0 - 400.0 K/uL 227.0 230.0 205.0    Lab Results  Component Value Date   CREATININE 0.93 01/29/2020   BUN 13 01/29/2020   NA 139 01/29/2020   K 4.7 01/29/2020   CL 105 01/29/2020   CO2 29 01/29/2020    Lab Results  Component Value Date   ALT 23 01/29/2020   AST 20 01/29/2020   ALKPHOS 99 01/29/2020   BILITOT 0.3 01/29/2020     Physical Exam: BP 114/62   Pulse 98   Ht 5\' 3"  (1.6 m)   Wt 151 lb (68.5 kg)   BMI 26.75 kg/m  Constitutional: Pleasant,well-developed, female in no acute distress. HEENT: Normocephalic and atraumatic. Conjunctivae are normal. No scleral icterus. Abdominal: Soft, nondistended, nontender.  There are no masses palpable.  Low back mild TTP along left lower back Extremities: no edema Lymphadenopathy: No cervical adenopathy noted. Neurological: Alert and oriented to person place and time. Skin: Skin is warm and dry. No rashes noted. Psychiatric: Normal mood and affect. Behavior is normal.   ASSESSMENT AND PLAN: 59 year old female here for reassessment the following:  Low back pain / Constipation - reassured her that her CT scan of her abdomen and pelvis and her colonoscopy looked okay without any concerning findings to cause the symptoms.  She could have a component of bowel spasm given some improvement with Bentyl  and with bowel movement, however she has focal tenderness in her left lower back, symptoms can be positional and she has clear pathology on MRI of her spine that could correlate with the symptoms.  She has been treated with prednisone per  her orthopedist and had resolution of her symptoms for period of time, now slowly recurring.  I do not feel that we need to pursue further evaluation of her GI tract at this point given her work-up to date, I think her symptoms are more likely musculoskeletal in etiology.  She has follow-up with her orthopedist and they are considering a steroid injection, if that resolves her symptoms then I would continue to follow-up with her orthopedist for this issue.  If her symptoms persist despite management of her back she can come back to see me as needed. Otherwise continue Miralax for constipation and Amitiza PRN - will help her with paperwork to apply for financial assistance. She agreed  GERD - large hiatal hernia, currently on twice daily PPI which has been controlling things better lately, she is working on diet and weight loss and and follow-up with me as needed for this issue.  Abnormal CBC - very mild eosinophilia on last CBC a few weeks ago.  I told her the absolutely eosinophil count is normal while she has a very mild increase in the relative percentage.  I will repeat this to see if this is persistent, provided reassurance, recommendations pending the results  La Fermina Cellar, MD [facility]

## 2020-02-19 NOTE — Patient Instructions (Addendum)
If you are age 59 or older, your body mass index should be between 23-30. Your Body mass index is 26.75 kg/m. If this is out of the aforementioned range listed, please consider follow up with your Primary Care Provider.  If you are age 46 or younger, your body mass index should be between 19-25. Your Body mass index is 26.75 kg/m. If this is out of the aformentioned range listed, please consider follow up with your Primary Care Provider.   Please follow up with Orthopedics regarding your lower back pain.  Please go to the lab in the basement of our building to have lab work done as you leave today. Hit "B" for basement when you get on the elevator.  When the doors open the lab is on your left.  We will call you with the results. Thank you.  Due to recent changes in healthcare laws, you may see the results of your imaging and laboratory studies on MyChart before your provider has had a chance to review them.  We understand that in some cases there may be results that are confusing or concerning to you. Not all laboratory results come back in the same time frame and the provider may be waiting for multiple results in order to interpret others.  Please give [place] 48 hours in order for your provider to thoroughly review all the results before contacting the office for clarification of your results.   We have sent the following medications to your pharmacy for you to pick up at your convenience: Bentyl  Continue Miralax and Amitiza.  Thank you for entrusting me with your care and for choosing [facility], Dr. Ileene Patrick

## 2020-03-03 ENCOUNTER — Ambulatory Visit: Payer: Medicare Other | Admitting: Internal Medicine

## 2020-03-15 ENCOUNTER — Other Ambulatory Visit: Payer: Self-pay

## 2020-03-15 ENCOUNTER — Other Ambulatory Visit: Payer: Self-pay | Admitting: Internal Medicine

## 2020-03-15 DIAGNOSIS — N63 Unspecified lump in unspecified breast: Secondary | ICD-10-CM

## 2020-03-15 DIAGNOSIS — R7989 Other specified abnormal findings of blood chemistry: Secondary | ICD-10-CM

## 2020-03-15 NOTE — Progress Notes (Signed)
Repeat CBC due

## 2020-03-22 ENCOUNTER — Encounter: Payer: Self-pay | Admitting: Internal Medicine

## 2020-03-22 DIAGNOSIS — R634 Abnormal weight loss: Secondary | ICD-10-CM

## 2020-03-29 ENCOUNTER — Other Ambulatory Visit: Payer: Medicare Other

## 2020-04-06 ENCOUNTER — Telehealth (INDEPENDENT_AMBULATORY_CARE_PROVIDER_SITE_OTHER): Payer: Medicare Other | Admitting: Internal Medicine

## 2020-04-06 ENCOUNTER — Encounter: Payer: Self-pay | Admitting: Internal Medicine

## 2020-04-06 ENCOUNTER — Other Ambulatory Visit (INDEPENDENT_AMBULATORY_CARE_PROVIDER_SITE_OTHER): Payer: Medicare Other

## 2020-04-06 ENCOUNTER — Other Ambulatory Visit: Payer: Self-pay

## 2020-04-06 DIAGNOSIS — J0100 Acute maxillary sinusitis, unspecified: Secondary | ICD-10-CM | POA: Diagnosis not present

## 2020-04-06 DIAGNOSIS — R7989 Other specified abnormal findings of blood chemistry: Secondary | ICD-10-CM

## 2020-04-06 LAB — CBC WITH DIFFERENTIAL/PLATELET
Basophils Absolute: 0.1 10*3/uL (ref 0.0–0.1)
Basophils Relative: 0.9 % (ref 0.0–3.0)
Eosinophils Absolute: 0.2 10*3/uL (ref 0.0–0.7)
Eosinophils Relative: 3.9 % (ref 0.0–5.0)
HCT: 35.8 % — ABNORMAL LOW (ref 36.0–46.0)
Hemoglobin: 11.8 g/dL — ABNORMAL LOW (ref 12.0–15.0)
Lymphocytes Relative: 27.5 % (ref 12.0–46.0)
Lymphs Abs: 1.6 10*3/uL (ref 0.7–4.0)
MCHC: 32.9 g/dL (ref 30.0–36.0)
MCV: 86.6 fl (ref 78.0–100.0)
Monocytes Absolute: 0.5 10*3/uL (ref 0.1–1.0)
Monocytes Relative: 9 % (ref 3.0–12.0)
Neutro Abs: 3.5 10*3/uL (ref 1.4–7.7)
Neutrophils Relative %: 58.7 % (ref 43.0–77.0)
Platelets: 255 10*3/uL (ref 150.0–400.0)
RBC: 4.14 Mil/uL (ref 3.87–5.11)
RDW: 15.6 % — ABNORMAL HIGH (ref 11.5–15.5)
WBC: 6 10*3/uL (ref 4.0–10.5)

## 2020-04-06 MED ORDER — AMOXICILLIN-POT CLAVULANATE 875-125 MG PO TABS: 1.0000 | ORAL_TABLET | Freq: Two times a day (BID) | ORAL | 0 refills | Status: DC

## 2020-04-06 NOTE — Progress Notes (Signed)
Virtual Visit via Video Note  I connected with Suzan Garibaldi on 04/06/20 at  3:00 PM EST by a video enabled telemedicine application and verified that I am speaking with the correct person using two identifiers.  Location patient: home Location provider: work office Persons participating in the virtual visit: patient, provider  I discussed the limitations of evaluation and management by telemedicine and the availability of in person appointments. The patient expressed understanding and agreed to proceed.   HPI: For the past 3 weeks she has been having symptoms of a "head/chest cold".  It started with a cough productive of yellowish sputum which is now improved.  The main issue now is her sinuses.  She has significant maxillary and frontal sinus pain with tooth pain.  When she blows her nose she has what she describes as brown, solid chunks.  She does not have any fever, no shortness of breath.  She has tried over-the-counter DayQuil, NyQuil sinus preparations, Flonase and nasal spray.  Throughout the course of this illness she has had 5 - Covid test for rapid antigens and 1 PCR.   ROS: Constitutional: Denies fever, chills, diaphoresis, appetite change and fatigue.  HEENT: Denies photophobia, eye pain, redness, hearing loss, mouth sores, trouble swallowing, neck pain, neck stiffness and tinnitus.   Respiratory: Denies SOB, DOE, chest tightness,  and wheezing.   Cardiovascular: Denies chest pain, palpitations and leg swelling.  Gastrointestinal: Denies nausea, vomiting, abdominal pain, diarrhea, constipation, blood in stool and abdominal distention.  Genitourinary: Denies dysuria, urgency, frequency, hematuria, flank pain and difficulty urinating.  Endocrine: Denies: hot or cold intolerance, sweats, changes in hair or nails, polyuria, polydipsia. Musculoskeletal: Denies myalgias, back pain, joint swelling, arthralgias and gait problem.  Skin: Denies pallor, rash and wound.   Neurological: Denies dizziness, seizures, syncope, weakness, light-headedness, numbness and headaches.  Hematological: Denies adenopathy. Easy bruising, personal or family bleeding history  Psychiatric/Behavioral: Denies suicidal ideation, mood changes, confusion, nervousness, sleep disturbance and agitation   Past Medical History:  Diagnosis Date  . Acute upper respiratory infections of unspecified site   . ADD (attention deficit disorder)   . Allergic rhinitis due to pollen   . Anal fissure   . Anemia, unspecified   . Anxiety disorder   . Asthma   . Chicken pox   . Complication of anesthesia    wakes up slow  . Depressive disorder, not elsewhere classified   . Diffuse cystic mastopathy   . Falls   . Fibromyalgia   . GERD (gastroesophageal reflux disease)   . Headache(784.0)   . History of anorexia nervosa   . History of bulimia   . IBS (irritable bowel syndrome)   . 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 Lump or mass in breast   . Meningitis, unspecified(322.9)   . Oral thrush   . Other specified glaucoma   . Perforation of tympanic membrane, unspecified   . Radial styloid tenosynovitis   . Seizures ([place])     Past Surgical History:  Procedure Laterality Date  . ADENOIDECTOMY    . ANKLE SURGERY  1992   right ankle - scar tissue  . DILATION AND CURETTAGE OF UTERUS  2006   History of Menometrorrhagia  . [place] to 1986   6 surgeries on right ear  . LAPAROSCOPY  1993   normal  . REDUCTION MAMMAPLASTY Bilateral 2018  . uterine ablation  2011  . WRIST SURGERY  2001   for de-quervaine -  bilateral wrists - tendonitis    Family History  Problem Relation Age of Onset  . Ovarian cancer Mother   . Heart disease Mother   . Hypertension Mother   . Coronary artery disease Mother   . Colon polyps Mother 31  . Heart failure Father   . Hypertension Father   . Hyperlipidemia Father   . Diabetes Father   . Breast cancer Maternal Grandmother   . Colon cancer Neg  Hx   . Esophageal cancer Neg Hx   . Stomach cancer Neg Hx   . Rectal cancer Neg Hx     SOCIAL HX:   reports that she has never smoked. She has never used smokeless tobacco. She reports current alcohol use of about 7.0 standard drinks of alcohol per week. She reports that she does not use drugs.   Current Outpatient Medications:  .  ALPRAZolam (XANAX) 0.5 MG tablet, Take 0.5 mg by mouth at bedtime as needed. For sleep.  Also 1/2 tab during the day., Disp: , Rfl:  .  amoxicillin-clavulanate (AUGMENTIN) 875-125 MG tablet, Take 1 tablet by mouth 2 (two) times daily., Disp: 20 tablet, Rfl: 0 .  brexpiprazole (REXULTI) 1 MG TABS tablet, Take 1 tablet by mouth. Every 5 days, Disp: , Rfl:  .  cyclobenzaprine (FLEXERIL) 10 MG tablet, Take 10 mg by mouth at bedtime as needed. , Disp: , Rfl:  .  dicyclomine (BENTYL) 10 MG capsule, Take 1 capsule (10 mg total) by mouth every 8 (eight) hours as needed for spasms., Disp: 60 capsule, Rfl: 2 .  DULoxetine (CYMBALTA) 60 MG capsule, Take by mouth 2 (two) times daily. , Disp: , Rfl:  .  esomeprazole (NEXIUM) 40 MG capsule, TAKE 1 CAPSULE (40 MG TOTAL) BY MOUTH 2 (TWO) TIMES DAILY BEFORE A MEAL., Disp: 180 capsule, Rfl: 1 .  fluconazole (DIFLUCAN) 150 MG tablet, Take 150 mg by mouth every 3 (three) days., Disp: , Rfl:  .  fluticasone (FLONASE) 50 MCG/ACT nasal spray, Place 1 spray into both nostrils daily as needed for allergies or rhinitis., Disp: , Rfl:  .  ibuprofen (ADVIL) 800 MG tablet, Take 1 tablet by mouth every 12 (twelve) hours as needed., Disp: , Rfl:  .  lubiprostone (AMITIZA) 8 MCG capsule, TAKE 1 CAPSULE (8 MCG TOTAL) BY MOUTH 2 (TWO) TIMES DAILY WITH A MEAL., Disp: 60 capsule, Rfl: 3 .  Multiple Vitamin (MULTIVITAMIN) tablet, Take 1 tablet by mouth daily., Disp: , Rfl:  .  ondansetron (ZOFRAN-ODT) 4 MG disintegrating tablet, Take 1 tablet (4 mg total) by mouth every 8 (eight) hours as needed for nausea or vomiting., Disp: 60 tablet, Rfl: 1 .   polyethylene glycol powder (GLYCOLAX/MIRALAX) 17 GM/SCOOP powder, Take 17 g by mouth daily., Disp: , Rfl:  .  sucralfate (CARAFATE) 1 g tablet, Take 1 tablet (1 g total) by mouth every 6 (six) hours as needed. Slowly dissolve 1 tablet in 1 Tablespoon of distilled water prior to taking, Disp: 60 tablet, Rfl: 3 .  traZODone (DESYREL) 50 MG tablet, Take 100 mg by mouth at bedtime., Disp: , Rfl:  .  predniSONE (DELTASONE) 10 MG tablet, prednisone 10 mg tablet  take 1 tab three times a day for 2 days, 1 tab twice a day for 5 days, 1 tab daily till finished, Disp: , Rfl:   EXAM:   VITALS per patient if applicable: None reported  GENERAL: alert, oriented, appears well and in no acute distress  HEENT: atraumatic, conjunttiva clear, no obvious  abnormalities on inspection of external nose and ears, wears corrective lenses  NECK: normal movements of the head and neck  LUNGS: on inspection no signs of respiratory distress, breathing rate appears normal, no obvious gross increased work of breathing, gasping or wheezing  CV: no obvious cyanosis  MS: moves all visible extremities without noticeable abnormality  PSYCH/NEURO: pleasant and cooperative, no obvious depression or anxiety, speech and thought processing grossly intact  ASSESSMENT AND PLAN:   Acute non-recurrent maxillary sinusitis  -Given duration of illness and significant nasal discharge, I believe it is time to consider antibiotic therapy. -I will prescribe Augmentin to take twice daily for 7 days, have advised that she continue to use an over-the-counter pain reliever as well as a Mucinex D type formulation. -As she no longer has cough, has never had shortness of breath, I do not believe a chest x-ray is necessary at this time. -She will reach out to me if symptoms fail to resolve.    I discussed the assessment and treatment plan with the patient. The patient was provided an opportunity to ask questions and all were answered. The  patient agreed with the plan and demonstrated an understanding of the instructions.   The patient was advised to call back or seek an in-person evaluation if the symptoms worsen or if the condition fails to improve as anticipated.    Lelon Frohlich, MD  [facility]

## 2020-04-07 ENCOUNTER — Encounter: Payer: Self-pay | Admitting: Internal Medicine

## 2020-04-14 ENCOUNTER — Ambulatory Visit: Payer: Medicare Other

## 2020-04-17 ENCOUNTER — Encounter: Payer: Self-pay | Admitting: Internal Medicine

## 2020-04-20 ENCOUNTER — Other Ambulatory Visit: Payer: Self-pay

## 2020-04-21 ENCOUNTER — Ambulatory Visit (INDEPENDENT_AMBULATORY_CARE_PROVIDER_SITE_OTHER): Payer: Medicare Other | Admitting: Internal Medicine

## 2020-04-21 ENCOUNTER — Encounter: Payer: Self-pay | Admitting: Internal Medicine

## 2020-04-21 VITALS — BP 120/80 | HR 78 | Temp 98.4°F | Ht 63.0 in | Wt 156.6 lb

## 2020-04-21 DIAGNOSIS — Z0000 Encounter for general adult medical examination without abnormal findings: Secondary | ICD-10-CM | POA: Diagnosis not present

## 2020-04-21 DIAGNOSIS — F332 Major depressive disorder, recurrent severe without psychotic features: Secondary | ICD-10-CM | POA: Diagnosis not present

## 2020-04-21 DIAGNOSIS — F410 Panic disorder [episodic paroxysmal anxiety] without agoraphobia: Secondary | ICD-10-CM

## 2020-04-21 DIAGNOSIS — F339 Major depressive disorder, recurrent, unspecified: Secondary | ICD-10-CM | POA: Diagnosis not present

## 2020-04-21 DIAGNOSIS — M545 Low back pain, unspecified: Secondary | ICD-10-CM

## 2020-04-21 LAB — COMPREHENSIVE METABOLIC PANEL
ALT: 18 U/L (ref 0–35)
AST: 15 U/L (ref 0–37)
Albumin: 4.2 g/dL (ref 3.5–5.2)
Alkaline Phosphatase: 84 U/L (ref 39–117)
BUN: 18 mg/dL (ref 6–23)
CO2: 28 mEq/L (ref 19–32)
Calcium: 9.4 mg/dL (ref 8.4–10.5)
Chloride: 105 mEq/L (ref 96–112)
Creatinine, Ser: 0.96 mg/dL (ref 0.40–1.20)
GFR: 64.66 mL/min (ref >60.00)
Glucose, Bld: 93 mg/dL (ref 70–99)
Potassium: 4.6 mEq/L (ref 3.5–5.1)
Sodium: 140 mEq/L (ref 135–145)
Total Bilirubin: 0.4 mg/dL (ref 0.2–1.2)
Total Protein: 6.6 g/dL (ref 6.0–8.3)

## 2020-04-21 LAB — LIPID PANEL
Cholesterol: 202 mg/dL — ABNORMAL HIGH (ref 0–200)
HDL: 66.7 mg/dL (ref >39.00)
LDL Cholesterol: 99 mg/dL (ref 0–99)
NonHDL: 135.39
Total CHOL/HDL Ratio: 3
Triglycerides: 180 mg/dL — ABNORMAL HIGH (ref 0.0–149.0)
VLDL: 36 mg/dL (ref 0.0–40.0)

## 2020-04-21 LAB — CBC WITH DIFFERENTIAL/PLATELET
Basophils Absolute: 0.1 10*3/uL (ref 0.0–0.1)
Basophils Relative: 0.4 % (ref 0.0–3.0)
Eosinophils Absolute: 0.1 10*3/uL (ref 0.0–0.7)
Eosinophils Relative: 0.9 % (ref 0.0–5.0)
HCT: 37.3 % (ref 36.0–46.0)
Hemoglobin: 12.2 g/dL (ref 12.0–15.0)
Lymphocytes Relative: 17.6 % (ref 12.0–46.0)
Lymphs Abs: 2.1 10*3/uL (ref 0.7–4.0)
MCHC: 32.8 g/dL (ref 30.0–36.0)
MCV: 85.9 fl (ref 78.0–100.0)
Monocytes Absolute: 1 10*3/uL (ref 0.1–1.0)
Monocytes Relative: 8.1 % (ref 3.0–12.0)
Neutro Abs: 8.6 10*3/uL — ABNORMAL HIGH (ref 1.4–7.7)
Neutrophils Relative %: 73 % (ref 43.0–77.0)
Platelets: 261 10*3/uL (ref 150.0–400.0)
RBC: 4.34 Mil/uL (ref 3.87–5.11)
RDW: 16 % — ABNORMAL HIGH (ref 11.5–15.5)
WBC: 11.8 10*3/uL — ABNORMAL HIGH (ref 4.0–10.5)

## 2020-04-21 LAB — HEMOGLOBIN A1C: Hgb A1c MFr Bld: 5.6 % (ref 4.6–6.5)

## 2020-04-21 MED ORDER — CYCLOBENZAPRINE HCL 10 MG PO TABS: 10.0000 mg | ORAL_TABLET | Freq: Every evening | ORAL | 2 refills | Status: DC | PRN

## 2020-04-21 NOTE — Addendum Note (Signed)
Addended by: Tessie Fass D on: 04/21/2020 08:44 AM   Modules accepted: Orders

## 2020-04-21 NOTE — Patient Instructions (Signed)
-Nice seeing you today!!  -Lab work today; will notify you once results are available.  -Consider shingles vaccination.  -Schedule follow up in 6 months.   Preventive Care 42-60 Years Old, Female Preventive care refers to lifestyle choices and visits with your health care provider that can promote health and wellness. This includes:  A yearly physical exam. This is also called an annual wellness visit.  Regular dental and eye exams.  Immunizations.  Screening for certain conditions.  Healthy lifestyle choices, such as: ? Eating a healthy diet. ? Getting regular exercise. ? Not using drugs or products that contain nicotine and tobacco. ? Limiting alcohol use. What can I expect for my preventive care visit? Physical exam Your health care provider will check your:  Height and weight. These may be used to calculate your BMI (body mass index). BMI is a measurement that tells if you are at a healthy weight.  Heart rate and blood pressure.  Body temperature.  Skin for abnormal spots. Counseling Your health care provider may ask you questions about your:  Past medical problems.  Family's medical history.  Alcohol, tobacco, and drug use.  Emotional well-being.  Home life and relationship well-being.  Sexual activity.  Diet, exercise, and sleep habits.  Work and work Astronomer.  Access to firearms.  Method of birth control.  Menstrual cycle.  Pregnancy history. What immunizations do I need? Vaccines are usually given at various ages, according to a schedule. Your health care provider will recommend vaccines for you based on your age, medical history, and lifestyle or other factors, such as travel or where you work.   What tests do I need? Blood tests  Lipid and cholesterol levels. These may be checked every 5 years, or more often if you are over 36 years old.  Hepatitis C test.  Hepatitis B test. Screening  Lung cancer screening. You may have this  screening every year starting at age 48 if you have a 30-pack-year history of smoking and currently smoke or have quit within the past 15 years.  Colorectal cancer screening. ? All adults should have this screening starting at age 78 and continuing until age 22. ? Your health care provider may recommend screening at age 80 if you are at increased risk. ? You will have tests every 1-10 years, depending on your results and the type of screening test.  Diabetes screening. ? This is done by checking your blood sugar (glucose) after you have not eaten for a while (fasting). ? You may have this done every 1-3 years.  Mammogram. ? This may be done every 1-2 years. ? Talk with your health care provider about when you should start having regular mammograms. This may depend on whether you have a family history of breast cancer.  BRCA-related cancer screening. This may be done if you have a family history of breast, ovarian, tubal, or peritoneal cancers.  Pelvic exam and Pap test. ? This may be done every 3 years starting at age 67. ? Starting at age 20, this may be done every 5 years if you have a Pap test in combination with an HPV test. Other tests  STD (sexually transmitted disease) testing, if you are at risk.  Bone density scan. This is done to screen for osteoporosis. You may have this scan if you are at high risk for osteoporosis. Talk with your health care provider about your test results, treatment options, and if necessary, the need for more tests. Follow these instructions at  home: Eating and drinking  Eat a diet that includes fresh fruits and vegetables, whole grains, lean protein, and low-fat dairy products.  Take vitamin and mineral supplements as recommended by your health care provider.  Do not drink alcohol if: ? Your health care provider tells you not to drink. ? You are pregnant, may be pregnant, or are planning to become pregnant.  If you drink alcohol: ? Limit how  much you have to 0-1 drink a day. ? Be aware of how much alcohol is in your drink. In the [place] one drink equals one 12 oz bottle of beer (355 mL), one 5 oz glass of wine (148 mL), or one 1 oz glass of hard liquor (44 mL).   Lifestyle  Take daily care of your teeth and gums. Brush your teeth every morning and night with fluoride toothpaste. Floss one time each day.  Stay active. Exercise for at least 30 minutes 5 or more days each week.  Do not use any products that contain nicotine or tobacco, such as cigarettes, e-cigarettes, and chewing tobacco. If you need help quitting, ask your health care provider.  Do not use drugs.  If you are sexually active, practice safe sex. Use a condom or other form of protection to prevent STIs (sexually transmitted infections).  If you do not wish to become pregnant, use a form of birth control. If you plan to become pregnant, see your health care provider for a prepregnancy visit.  If told by your health care provider, take low-dose aspirin daily starting at age 84.  Find healthy ways to cope with stress, such as: ? Meditation, yoga, or listening to music. ? Journaling. ? Talking to a trusted person. ? Spending time with friends and family. Safety  Always wear your seat belt while driving or riding in a vehicle.  Do not drive: ? If you have been drinking alcohol. Do not ride with someone who has been drinking. ? When you are tired or distracted. ? While texting.  Wear a helmet and other protective equipment during sports activities.  If you have firearms in your house, make sure you follow all gun safety procedures. What's next?  Visit your health care provider once a year for an annual wellness visit.  Ask your health care provider how often you should have your eyes and teeth checked.  Stay up to date on all vaccines. This information is not intended to replace advice given to you by your health care provider. Make sure you discuss any  questions you have with your health care provider. Document Revised: 11/11/2019 Document Reviewed: 10/18/2017 Elsevier Patient Education  2021 [employer].

## 2020-04-21 NOTE — Progress Notes (Signed)
Established Patient Office Visit     This visit occurred during the SARS-CoV-2 public health emergency.  Safety protocols were in place, including screening questions prior to the visit, additional usage of staff PPE, and extensive cleaning of exam room while observing appropriate contact time as indicated for disinfecting solutions.    CC/Reason for Visit: Annual preventive exam  HPI: Jordan [name] is a 60 y.o. female who is coming in today for the above mentioned reasons. Past Medical History is significant for: Depression, anxiety, ADHD followed by psychiatry.  She has routine eye and dental care.  She has had Covid and flu vaccines, is due to start shingles series.  She had a colonoscopy in 2021.  She is scheduled for her mammogram later this month.  She had a Pap smear in December 2021 by her GYN.  She was recently treated with Augmentin for a sinus infection and reports improvement.  She has been feeling very tired.  She has been having to take naps despite having a full night of sleep.  She is seeing her psychiatrist this afternoon to consider maybe going back on Adderall.   Past Medical/Surgical History: Past Medical History:  Diagnosis Date  . Acute upper respiratory infections of unspecified site   . ADD (attention deficit disorder)   . Allergic rhinitis due to pollen   . Anal fissure   . Anemia, unspecified   . Anxiety disorder   . Asthma   . Chicken pox   . Complication of anesthesia    wakes up slow  . Depressive disorder, not elsewhere classified   . Diffuse cystic mastopathy   . Falls   . Fibromyalgia   . GERD (gastroesophageal reflux disease)   . Headache(784.0)   . History of anorexia nervosa   . History of bulimia   . IBS (irritable bowel syndrome)   . 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 Lump or mass in breast   . Meningitis, unspecified(322.9)   . Oral thrush   . Other specified glaucoma   . Perforation of tympanic membrane, unspecified   .  Radial styloid tenosynovitis   . Seizures ([place])     Past Surgical History:  Procedure Laterality Date  . ADENOIDECTOMY    . ANKLE SURGERY  1992   right ankle - scar tissue  . DILATION AND CURETTAGE OF UTERUS  2006   History of Menometrorrhagia  . [place] to 1986   6 surgeries on right ear  . LAPAROSCOPY  1993   normal  . REDUCTION MAMMAPLASTY Bilateral 2018  . uterine ablation  2011  . WRIST SURGERY  2001   for de-quervaine - bilateral wrists - tendonitis    Social History:  reports that she has never smoked. She has never used smokeless tobacco. She reports current alcohol use of about 7.0 standard drinks of alcohol per week. She reports that she does not use drugs.  Allergies: Allergies  Allergen Reactions  . Prednisone Rash    Face redness and swollen per patient  . Pregabalin Other (See Comments)    Delusions  . Remeron [Mirtazapine] Other (See Comments)    Severe hallucinations    Family History:  Family History  Problem Relation Age of Onset  . Ovarian cancer Mother   . Heart disease Mother   . Hypertension Mother   . Coronary artery disease Mother   . Colon polyps Mother 22  . Heart failure Father   . Hypertension  Father   . Hyperlipidemia Father   . Diabetes Father   . Breast cancer Maternal Grandmother   . Colon cancer Neg Hx   . Esophageal cancer Neg Hx   . Stomach cancer Neg Hx   . Rectal cancer Neg Hx      Current Outpatient Medications:  .  ALPRAZolam (XANAX) 0.5 MG tablet, Take 0.5 mg by mouth at bedtime as needed. For sleep.  Also 1/2 tab during the day., Disp: , Rfl:  .  amoxicillin-clavulanate (AUGMENTIN) 875-125 MG tablet, Take 1 tablet by mouth 2 (two) times daily., Disp: 20 tablet, Rfl: 0 .  brexpiprazole (REXULTI) 1 MG TABS tablet, Take 1 tablet by mouth. Every 5 days, Disp: , Rfl:  .  dicyclomine (BENTYL) 10 MG capsule, Take 1 capsule (10 mg total) by mouth every 8 (eight) hours as needed for spasms., Disp: 60 capsule,  Rfl: 2 .  DULoxetine (CYMBALTA) 60 MG capsule, Take by mouth 2 (two) times daily. , Disp: , Rfl:  .  esomeprazole (NEXIUM) 40 MG capsule, TAKE 1 CAPSULE (40 MG TOTAL) BY MOUTH 2 (TWO) TIMES DAILY BEFORE A MEAL., Disp: 180 capsule, Rfl: 1 .  fluticasone (FLONASE) 50 MCG/ACT nasal spray, Place 1 spray into both nostrils daily as needed for allergies or rhinitis., Disp: , Rfl:  .  ibuprofen (ADVIL) 800 MG tablet, Take 1 tablet by mouth every 12 (twelve) hours as needed., Disp: , Rfl:  .  lubiprostone (AMITIZA) 8 MCG capsule, TAKE 1 CAPSULE (8 MCG TOTAL) BY MOUTH 2 (TWO) TIMES DAILY WITH A MEAL., Disp: 60 capsule, Rfl: 3 .  Multiple Vitamin (MULTIVITAMIN) tablet, Take 1 tablet by mouth daily., Disp: , Rfl:  .  ondansetron (ZOFRAN-ODT) 4 MG disintegrating tablet, Take 1 tablet (4 mg total) by mouth every 8 (eight) hours as needed for nausea or vomiting., Disp: 60 tablet, Rfl: 1 .  polyethylene glycol powder (GLYCOLAX/MIRALAX) 17 GM/SCOOP powder, Take 17 g by mouth daily., Disp: , Rfl:  .  sucralfate (CARAFATE) 1 g tablet, Take 1 tablet (1 g total) by mouth every 6 (six) hours as needed. Slowly dissolve 1 tablet in 1 Tablespoon of distilled water prior to taking, Disp: 60 tablet, Rfl: 3 .  traZODone (DESYREL) 50 MG tablet, Take 100 mg by mouth at bedtime., Disp: , Rfl:  .  cyclobenzaprine (FLEXERIL) 10 MG tablet, Take 1 tablet (10 mg total) by mouth at bedtime as needed., Disp: 30 tablet, Rfl: 2  Review of Systems:  Constitutional: Denies fever, chills, diaphoresis, appetite change and fatigue.  HEENT: Denies photophobia, eye pain, redness, hearing loss, ear pain, congestion, sore throat, rhinorrhea, sneezing, mouth sores, trouble swallowing, neck pain, neck stiffness and tinnitus.   Respiratory: Denies SOB, DOE, cough, chest tightness,  and wheezing.   Cardiovascular: Denies chest pain, palpitations and leg swelling.  Gastrointestinal: Denies nausea, vomiting, abdominal pain, diarrhea, constipation,  blood in stool and abdominal distention.  Genitourinary: Denies dysuria, urgency, frequency, hematuria, flank pain and difficulty urinating.  Endocrine: Denies: hot or cold intolerance, sweats, changes in hair or nails, polyuria, polydipsia. Musculoskeletal: Denies myalgias, back pain, joint swelling, arthralgias and gait problem.  Skin: Denies pallor, rash and wound.  Neurological: Denies dizziness, seizures, syncope, weakness, light-headedness, numbness and headaches.  Hematological: Denies adenopathy. Easy bruising, personal or family bleeding history  Psychiatric/Behavioral: Denies suicidal ideation, mood changes, confusion, nervousness, sleep disturbance and agitation    Physical Exam: Vitals:   04/21/20 0756  BP: 120/80  Pulse: 78  Temp: 98.4 F (36.9  C)  TempSrc: Oral  SpO2: 98%  Weight: 156 lb 9.6 oz (71 kg)  Height: $Remove'5\' 3"'[id number]$  (1.6 m)    Body mass index is 27.74 kg/m.   Constitutional: NAD, calm, comfortable Eyes: PERRL, lids and conjunctivae normal, wears corrective lenses ENMT: Mucous membranes are moist. Posterior pharynx clear of any exudate or lesions. Normal dentition. Tympanic membrane is pearly white, no erythema or bulging. Neck: normal, supple, no masses, no thyromegaly Respiratory: clear to auscultation bilaterally, no wheezing, no crackles. Normal respiratory effort. No accessory muscle use.  Cardiovascular: Regular rate and rhythm, no murmurs / rubs / gallops. No extremity edema. 2+ pedal pulses.  Abdomen: no tenderness, no masses palpated. No hepatosplenomegaly. Bowel sounds positive.  Musculoskeletal: no clubbing / cyanosis. No joint deformity upper and lower extremities. Good ROM, no contractures. Normal muscle tone.  Skin: no rashes, lesions, ulcers. No induration Neurologic: CN 2-12 grossly intact. Sensation intact, DTR normal. Strength 5/5 in all 4.  Psychiatric: Normal judgment and insight. Alert and oriented x 3. Normal mood.    Impression and  Plan:  Encounter for preventive health examination  -She has routine eye and dental care. -Immunizations are up-to-date including COVID and influenza with the exception of shingles.  She declines today. -Screening labs today. -Healthy lifestyle discussed in detail. -Mammogram is scheduled for March 21. -She had a colonoscopy in March 2021. -She had a Pap smear with her GYN in December 2021.  Severe recurrent major depression without psychotic features ([place]) Depression, recurrent ([place]) Panic disorder without agoraphobia -Followed by psychiatry, has appointment later today.  Back pain, lumbosacral  - Plan: cyclobenzaprine (FLEXERIL) 10 MG tablet -She has been seen orthopedics.  She has received epidural injections and a course of prednisone.   Patient Instructions   -Nice seeing you today!!  -Lab work today; will notify you once results are available.  -Consider shingles vaccination.  -Schedule follow up in 6 months.   Preventive Care 6-49 Years Old, Female Preventive care refers to lifestyle choices and visits with your health care provider that can promote health and wellness. This includes:  A yearly physical exam. This is also called an annual wellness visit.  Regular dental and eye exams.  Immunizations.  Screening for certain conditions.  Healthy lifestyle choices, such as: ? Eating a healthy diet. ? Getting regular exercise. ? Not using drugs or products that contain nicotine and tobacco. ? Limiting alcohol use. What can I expect for my preventive care visit? Physical exam Your health care provider will check your:  Height and weight. These may be used to calculate your BMI (body mass index). BMI is a measurement that tells if you are at a healthy weight.  Heart rate and blood pressure.  Body temperature.  Skin for abnormal spots. Counseling Your health care provider may ask you questions about your:  Past medical problems.  Family's medical  history.  Alcohol, tobacco, and drug use.  Emotional well-being.  Home life and relationship well-being.  Sexual activity.  Diet, exercise, and sleep habits.  Work and work Statistician.  Access to firearms.  Method of birth control.  Menstrual cycle.  Pregnancy history. What immunizations do I need? Vaccines are usually given at various ages, according to a schedule. Your health care provider will recommend vaccines for you based on your age, medical history, and lifestyle or other factors, such as travel or where you work.   What tests do I need? Blood tests  Lipid and cholesterol levels. These may be checked every  5 years, or more often if you are over 84 years old.  Hepatitis C test.  Hepatitis B test. Screening  Lung cancer screening. You may have this screening every year starting at age 90 if you have a 30-pack-year history of smoking and currently smoke or have quit within the past 15 years.  Colorectal cancer screening. ? All adults should have this screening starting at age 67 and continuing until age 71. ? Your health care provider may recommend screening at age 53 if you are at increased risk. ? You will have tests every 1-10 years, depending on your results and the type of screening test.  Diabetes screening. ? This is done by checking your blood sugar (glucose) after you have not eaten for a while (fasting). ? You may have this done every 1-3 years.  Mammogram. ? This may be done every 1-2 years. ? Talk with your health care provider about when you should start having regular mammograms. This may depend on whether you have a family history of breast cancer.  BRCA-related cancer screening. This may be done if you have a family history of breast, ovarian, tubal, or peritoneal cancers.  Pelvic exam and Pap test. ? This may be done every 3 years starting at age 53. ? Starting at age 37, this may be done every 5 years if you have a Pap test in combination  with an HPV test. Other tests  STD (sexually transmitted disease) testing, if you are at risk.  Bone density scan. This is done to screen for osteoporosis. You may have this scan if you are at high risk for osteoporosis. Talk with your health care provider about your test results, treatment options, and if necessary, the need for more tests. Follow these instructions at home: Eating and drinking  Eat a diet that includes fresh fruits and vegetables, whole grains, lean protein, and low-fat dairy products.  Take vitamin and mineral supplements as recommended by your health care provider.  Do not drink alcohol if: ? Your health care provider tells you not to drink. ? You are pregnant, may be pregnant, or are planning to become pregnant.  If you drink alcohol: ? Limit how much you have to 0-1 drink a day. ? Be aware of how much alcohol is in your drink. In the [place] one drink equals one 12 oz bottle of beer (355 mL), one 5 oz glass of wine (148 mL), or one 1 oz glass of hard liquor (44 mL).   Lifestyle  Take daily care of your teeth and gums. Brush your teeth every morning and night with fluoride toothpaste. Floss one time each day.  Stay active. Exercise for at least 30 minutes 5 or more days each week.  Do not use any products that contain nicotine or tobacco, such as cigarettes, e-cigarettes, and chewing tobacco. If you need help quitting, ask your health care provider.  Do not use drugs.  If you are sexually active, practice safe sex. Use a condom or other form of protection to prevent STIs (sexually transmitted infections).  If you do not wish to become pregnant, use a form of birth control. If you plan to become pregnant, see your health care provider for a prepregnancy visit.  If told by your health care provider, take low-dose aspirin daily starting at age 66.  Find healthy ways to cope with stress, such as: ? Meditation, yoga, or listening to  music. ? Journaling. ? Talking to a trusted person. ? Spending time  with friends and family. Safety  Always wear your seat belt while driving or riding in a vehicle.  Do not drive: ? If you have been drinking alcohol. Do not ride with someone who has been drinking. ? When you are tired or distracted. ? While texting.  Wear a helmet and other protective equipment during sports activities.  If you have firearms in your house, make sure you follow all gun safety procedures. What's next?  Visit your health care provider once a year for an annual wellness visit.  Ask your health care provider how often you should have your eyes and teeth checked.  Stay up to date on all vaccines. This information is not intended to replace advice given to you by your health care provider. Make sure you discuss any questions you have with your health care provider. Document Revised: 11/11/2019 Document Reviewed: 10/18/2017 Elsevier Patient Education  2021 [place], [place] [facility]

## 2020-04-22 ENCOUNTER — Encounter: Payer: Self-pay | Admitting: Internal Medicine

## 2020-04-22 LAB — VITAMIN D 25 HYDROXY (VIT D DEFICIENCY, FRACTURES): VITD: 37.54 ng/mL (ref 30.00–100.00)

## 2020-04-22 LAB — VITAMIN B12: Vitamin B-12: 721 pg/mL (ref 211–911)

## 2020-04-22 LAB — TSH: TSH: 1.3 u[IU]/mL (ref 0.35–4.50)

## 2020-05-03 ENCOUNTER — Other Ambulatory Visit: Payer: Self-pay

## 2020-05-03 ENCOUNTER — Telehealth: Payer: Self-pay

## 2020-05-03 DIAGNOSIS — R7989 Other specified abnormal findings of blood chemistry: Secondary | ICD-10-CM

## 2020-05-03 NOTE — Progress Notes (Signed)
Called and spoke to patient. She will go to the lab on Thursday.

## 2020-05-03 NOTE — Telephone Encounter (Signed)
Patient recently had CBC.  Orders for TIBC ferritin and B12/folate panel entered. Called and spoke to patient. She will go to the lab on Thursday.

## 2020-05-03 NOTE — Telephone Encounter (Signed)
-----   Message from Roetta Sessions, [place] sent at 04/06/2020  3:13 PM EST ----- Regarding: labs due next week CBC, TIBC / ferritin, B12 / folate panel due the week of March 21

## 2020-05-09 ENCOUNTER — Ambulatory Visit
Admission: EM | Admit: 2020-05-09 | Discharge: 2020-05-09 | Disposition: A | Discharge status: Home / Self Care | Payer: Medicare Other | Attending: Emergency Medicine | Admitting: Emergency Medicine

## 2020-05-09 ENCOUNTER — Ambulatory Visit (INDEPENDENT_AMBULATORY_CARE_PROVIDER_SITE_OTHER): Payer: Medicare Other

## 2020-05-09 ENCOUNTER — Other Ambulatory Visit: Payer: Self-pay

## 2020-05-09 ENCOUNTER — Encounter: Payer: Self-pay | Admitting: *Deleted

## 2020-05-09 DIAGNOSIS — R0981 Nasal congestion: Secondary | ICD-10-CM | POA: Diagnosis not present

## 2020-05-09 DIAGNOSIS — R1012 Left upper quadrant pain: Secondary | ICD-10-CM | POA: Diagnosis not present

## 2020-05-09 DIAGNOSIS — J22 Unspecified acute lower respiratory infection: Secondary | ICD-10-CM | POA: Diagnosis not present

## 2020-05-09 DIAGNOSIS — R5383 Other fatigue: Secondary | ICD-10-CM

## 2020-05-09 DIAGNOSIS — R490 Dysphonia: Secondary | ICD-10-CM | POA: Diagnosis not present

## 2020-05-09 MED ORDER — DOXYCYCLINE HYCLATE 100 MG PO CAPS: 100.0000 mg | ORAL_CAPSULE | Freq: Two times a day (BID) | ORAL | 0 refills | Status: AC

## 2020-05-09 MED ORDER — CETIRIZINE HCL 10 MG PO CAPS: 10.0000 mg | ORAL_CAPSULE | Freq: Every day | ORAL | 0 refills | Status: DC

## 2020-05-09 MED ORDER — BENZONATATE 200 MG PO CAPS: 200.0000 mg | ORAL_CAPSULE | Freq: Three times a day (TID) | ORAL | 0 refills | Status: AC | PRN

## 2020-05-09 MED ORDER — METHYLPREDNISOLONE 4 MG PO TBPK: ORAL_TABLET | ORAL | 0 refills | Status: DC

## 2020-05-09 NOTE — ED Provider Notes (Signed)
[facility]    CSN: [id number] Arrival date & time: 05/09/20  1305      History   Chief Complaint Chief Complaint  Patient presents with  . Sore Throat  . Cough    HPI Jordan [name] is a 60 y.o. female history of glaucoma, MS, presenting today for evaluation of URI symptoms.  Reports that she has had sore throat, congestion, hoarseness and discomfort in her left ribs.  Negative Covid test at home yesterday.  Reports symptoms over 1 month.  Was seen by PCP on 2/15 and treated for sinusitis with Augmentin.  Symptoms never fully resolved.  Reports worsening cough of recently as well as discomfort in left ribs.  Increased hoarseness.  HPI  Past Medical History:  Diagnosis Date  . Acute upper respiratory infections of unspecified site   . ADD (attention deficit disorder)   . Allergic rhinitis due to pollen   . Anal fissure   . Anemia, unspecified   . Anxiety disorder   . Asthma   . Chicken pox   . Complication of anesthesia    wakes up slow  . Depressive disorder, not elsewhere classified   . Diffuse cystic mastopathy   . Falls   . Fibromyalgia   . GERD (gastroesophageal reflux disease)   . Headache(784.0)   . History of anorexia nervosa   . History of bulimia   . IBS (irritable bowel syndrome)   . 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 Lump or mass in breast   . Meningitis, unspecified(322.9)   . Oral thrush   . Other specified glaucoma   . Perforation of tympanic membrane, unspecified   . Radial styloid tenosynovitis   . Seizures [facility])     Patient Active Problem List   Diagnosis Date Noted  . Severe recurrent major depression without psychotic features ([place]) 09/27/2015    Class: Chronic  . Fibromyalgia 04/03/2013  . Back pain, lumbosacral 04/03/2013  . Degeneration of cervical intervertebral disc 04/03/2013  . Dyspepsia and disorder of function of stomach 03/16/2012  . Routine health maintenance 01/03/2012  . Mild cognitive impairment with  memory loss 01/03/2012  . Cough 07/03/2011  . Bilateral dry eyes 05/13/2011  . Back pain, thoracic 06/15/2010  . MULTIPLE SCLEROSIS, PROGRESSIVE/RELAPSING 06/02/2009  . Incontinence without sensory awareness 06/01/2009  . NASAL POLYP 05/14/2009  . Panic disorder without agoraphobia 04/25/2007  . Carpal tunnel syndrome 04/25/2007  . NECK PAIN, CHRONIC 02/19/2007  . HAY FEVER 02/18/2007  . ANEMIA-NOS 12/05/2006  . Depression, recurrent ([place]) 12/05/2006  . GLAUCOMA NEC 12/05/2006  . TYMPANIC MEMBRANE PERFORATION 12/05/2006  . FIBROCYSTIC BREAST DISEASE 12/05/2006  . DE QUERVAIN'S TENOSYNOVITIS 12/05/2006  . Headache 12/05/2006  . BULIMIA, HX OF 12/05/2006  . ANOREXIA, HX OF 12/05/2006    Past Surgical History:  Procedure Laterality Date  . ADENOIDECTOMY    . ANKLE SURGERY  1992   right ankle - scar tissue  . DILATION AND CURETTAGE OF UTERUS  2006   History of Menometrorrhagia  . [place] to 1986   6 surgeries on right ear  . LAPAROSCOPY  1993   normal  . REDUCTION MAMMAPLASTY Bilateral 2018  . uterine ablation  2011  . WRIST SURGERY  2001   for de-quervaine - bilateral wrists - tendonitis    OB History   No obstetric history on file.      Home Medications    Prior to Admission medications   Medication Sig Start  Date End Date Taking? Authorizing Provider  ALPRAZolam Duanne Moron) 0.5 MG tablet Take 0.5 mg by mouth at bedtime as needed. For sleep.  Also 1/2 tab during the day.   Yes [provider]  benzonatate (TESSALON) 200 MG capsule Take 1 capsule (200 mg total) by mouth 3 (three) times daily as needed for up to 7 days for cough. 05/09/20 05/16/20 Yes Kamaree Berkel C, PA-C  brexpiprazole (REXULTI) 1 MG TABS tablet Take 1 tablet by mouth. Every 5 days   Yes [provider]  Cetirizine HCl 10 MG CAPS Take 1 capsule (10 mg total) by mouth daily for 10 days. 05/09/20 05/19/20 Yes Nikolai Wilczak C, PA-C  cyclobenzaprine (FLEXERIL) 10 MG tablet  Take 1 tablet (10 mg total) by mouth at bedtime as needed. 04/21/20  Yes Isaac Bliss, Rayford Halsted, MD  dicyclomine (BENTYL) 10 MG capsule Take 1 capsule (10 mg total) by mouth every 8 (eight) hours as needed for spasms. 02/19/20  Yes Armbruster, Carlota Raspberry, MD  doxycycline (VIBRAMYCIN) 100 MG capsule Take 1 capsule (100 mg total) by mouth 2 (two) times daily for 7 days. 05/09/20 05/16/20 Yes Letroy Vazguez C, PA-C  DULoxetine (CYMBALTA) 60 MG capsule Take by mouth 2 (two) times daily.    Yes [provider]  esomeprazole (NEXIUM) 40 MG capsule TAKE 1 CAPSULE (40 MG TOTAL) BY MOUTH 2 (TWO) TIMES DAILY BEFORE A MEAL. 02/09/20  Yes Armbruster, Carlota Raspberry, MD  fluticasone (FLONASE) 50 MCG/ACT nasal spray Place 1 spray into both nostrils daily as needed for allergies or rhinitis.   Yes [provider]  lubiprostone (AMITIZA) 8 MCG capsule TAKE 1 CAPSULE (8 MCG TOTAL) BY MOUTH 2 (TWO) TIMES DAILY WITH A MEAL. 01/13/20  Yes Willia Craze, NP  methylPREDNISolone (MEDROL DOSEPAK) 4 MG TBPK tablet Take as directed 05/09/20  Yes Jerrilyn Messinger C, PA-C  Multiple Vitamin (MULTIVITAMIN) tablet Take 1 tablet by mouth daily.   Yes [provider]  traZODone (DESYREL) 50 MG tablet Take 100 mg by mouth at bedtime.   Yes [provider]  ibuprofen (ADVIL) 800 MG tablet Take 1 tablet by mouth every 12 (twelve) hours as needed. 11/24/19   [provider]  ondansetron (ZOFRAN-ODT) 4 MG disintegrating tablet Take 1 tablet (4 mg total) by mouth every 8 (eight) hours as needed for nausea or vomiting. 05/07/19   Armbruster, Carlota Raspberry, MD  polyethylene glycol powder (GLYCOLAX/MIRALAX) 17 GM/SCOOP powder Take 17 g by mouth daily.    [provider]  sucralfate (CARAFATE) 1 g tablet Take 1 tablet (1 g total) by mouth every 6 (six) hours as needed. Slowly dissolve 1 tablet in 1 Tablespoon of distilled water prior to taking 05/07/19   Armbruster, Carlota Raspberry, MD    Family  History Family History  Problem Relation Age of Onset  . Ovarian cancer Mother   . Heart disease Mother   . Hypertension Mother   . Coronary artery disease Mother   . Colon polyps Mother 63  . Heart failure Father   . Hypertension Father   . Hyperlipidemia Father   . Diabetes Father   . Breast cancer Maternal Grandmother   . Colon cancer Neg Hx   . Esophageal cancer Neg Hx   . Stomach cancer Neg Hx   . Rectal cancer Neg Hx     Social History Social History   Tobacco Use  . Smoking status: Never Smoker  . Smokeless tobacco: Never Used  Vaping Use  . Vaping  Use: Never used  Substance Use Topics  . Alcohol use: Yes    Alcohol/week: 7.0 standard drinks    Types: 7 Glasses of wine per week  . Drug use: No     Allergies   Prednisone, Pregabalin, and Remeron [mirtazapine]   Review of Systems Review of Systems  Constitutional: Negative for activity change, appetite change, chills, fatigue and fever.  HENT: Positive for congestion, rhinorrhea, sinus pressure, sore throat and voice change. Negative for ear pain and trouble swallowing.   Eyes: Negative for discharge and redness.  Respiratory: Positive for cough. Negative for chest tightness and shortness of breath.   Cardiovascular: Negative for chest pain.  Gastrointestinal: Negative for abdominal pain, diarrhea, nausea and vomiting.  Musculoskeletal: Negative for myalgias.  Skin: Negative for rash.  Neurological: Negative for dizziness, light-headedness and headaches.     Physical Exam Triage Vital Signs ED Triage Vitals  Enc Vitals Group     BP 05/09/20 1315 (!) 155/86     Pulse Rate 05/09/20 1315 83     Resp 05/09/20 1315 14     Temp 05/09/20 1315 97.7 F (36.5 C)     Temp Source 05/09/20 1315 Temporal     SpO2 05/09/20 1315 97 %     Weight --      Height --      Head Circumference --      Peak Flow --      Pain Score 05/09/20 1316 7     Pain Loc --      Pain Edu? --      Excl. in [place]? --    No data  found.  Updated Vital Signs BP (!) 155/86   Pulse 83   Temp 97.7 F (36.5 C) (Temporal)   Resp 14   SpO2 97%   Visual Acuity Right Eye Distance:   Left Eye Distance:   Bilateral Distance:    Right Eye Near:   Left Eye Near:    Bilateral Near:     Physical Exam Vitals and nursing note reviewed.  Constitutional:      Appearance: She is well-developed.     Comments: No acute distress  HENT:     Head: Normocephalic and atraumatic.     Ears:     Comments: Bilateral ears without tenderness to palpation of external auricle, tragus and mastoid, EAC's without erythema or swelling, TM's with good bony landmarks and cone of light. Non erythematous.     Nose: Nose normal.     Mouth/Throat:     Comments: Oral mucosa pink and moist, no tonsillar enlargement or exudate. Posterior pharynx patent and nonerythematous, no uvula deviation or swelling.  Voice hoarse Eyes:     Conjunctiva/sclera: Conjunctivae normal.  Cardiovascular:     Rate and Rhythm: Normal rate.  Pulmonary:     Effort: Pulmonary effort is normal. No respiratory distress.     Comments: Breathing comfortably at rest, CTABL, no wheezing, rales or other adventitious sounds auscultated Abdominal:     General: There is no distension.  Musculoskeletal:        General: Normal range of motion.     Cervical back: Neck supple.  Skin:    General: Skin is warm and dry.  Neurological:     Mental Status: She is alert and oriented to person, place, and time.      UC Treatments / Results  Labs (all labs ordered are listed, but only abnormal results are displayed) Labs Reviewed - No data to  display  EKG   Radiology DG Chest 2 View  Result Date: 05/09/2020 CLINICAL DATA:  Nasal congestion, hoarse voice, left side pain, easily fatigued x 1 month. No known heart or lung conditions. Nonsmoker. EXAM: CHEST - 2 VIEW COMPARISON:  Chest radiograph 08/23/2011 FINDINGS: The cardiomediastinal contours are within normal limits. The  lungs are clear. No pneumothorax or pleural effusion. No acute finding in the visualized skeleton. IMPRESSION: No acute cardiopulmonary process. Electronically Signed   By: Audie Pinto M.D.   On: 05/09/2020 14:00    Procedures Procedures (including critical care time)  Medications Ordered in UC Medications - No data to display  Initial Impression / Assessment and Plan / UC Course  I have reviewed the triage vital signs and the nursing notes.  Pertinent labs & imaging results that were available during my care of the patient were reviewed by me and considered in my medical decision making (see chart for details).     X-ray negative for pneumonia, but given symptoms over 1 month will trial alternative antibiotic with doxycycline, Medrol Dosepak x6 days, reports questionable allergy to prednisone but she has previously tolerated prednisone prior to questionable reaction.  Patient feels comfortable continuing with alternative steroid.  Tessalon and Zyrtec, honey and hot tea.  Continue to monitor,Discussed strict return precautions. Patient verbalized understanding and is agreeable with plan.  Final Clinical Impressions(s) / UC Diagnoses   Final diagnoses:  Lower respiratory infection (e.g., bronchitis, pneumonia, pneumonitis, pulmonitis)     Discharge Instructions     Begin doxycycline twice daily for 1 week Medrol Dosepak x6 days Tessalon/benzonatate every 8 hours for cough Daily cetirizine help with congestion and drainage Honey and hot tea Follow-up if not improving or worsening    ED Prescriptions    Medication Sig Dispense Auth. Provider   doxycycline (VIBRAMYCIN) 100 MG capsule Take 1 capsule (100 mg total) by mouth 2 (two) times daily for 7 days. 14 capsule Exavior Kimmons C, PA-C   methylPREDNISolone (MEDROL DOSEPAK) 4 MG TBPK tablet Take as directed 21 each Masami Plata C, PA-C   benzonatate (TESSALON) 200 MG capsule Take 1 capsule (200 mg total) by mouth 3  (three) times daily as needed for up to 7 days for cough. 28 capsule Rickiya Picariello C, PA-C   Cetirizine HCl 10 MG CAPS Take 1 capsule (10 mg total) by mouth daily for 10 days. 10 capsule Wateen Varon, [place] C, PA-C     PDMP not reviewed this encounter.   Janith Lima, [place] 05/09/20 1413

## 2020-05-09 NOTE — Discharge Instructions (Signed)
Begin doxycycline twice daily for 1 week Medrol Dosepak x6 days Tessalon/benzonatate every 8 hours for cough Daily cetirizine help with congestion and drainage Honey and hot tea Follow-up if not improving or worsening

## 2020-05-09 NOTE — ED Triage Notes (Addendum)
C/O sore throat with slight nasal congestion, hoarse voice, pain in left ribcage "in my lungs".  Denies fevers.  States had negative Covid test at home yesterday.

## 2020-05-10 ENCOUNTER — Other Ambulatory Visit: Payer: Medicare Other

## 2020-05-10 ENCOUNTER — Ambulatory Visit: Payer: Medicare Other

## 2020-05-11 ENCOUNTER — Encounter: Payer: Self-pay | Admitting: Internal Medicine

## 2020-05-19 ENCOUNTER — Other Ambulatory Visit: Payer: Self-pay | Admitting: Gastroenterology

## 2020-05-28 ENCOUNTER — Ambulatory Visit (INDEPENDENT_AMBULATORY_CARE_PROVIDER_SITE_OTHER): Payer: Medicare Other | Admitting: Otolaryngology

## 2020-05-31 ENCOUNTER — Encounter: Payer: Self-pay | Admitting: Internal Medicine

## 2020-05-31 DIAGNOSIS — M545 Low back pain, unspecified: Secondary | ICD-10-CM

## 2020-06-01 MED ORDER — CYCLOBENZAPRINE HCL 10 MG PO TABS: 10.0000 mg | ORAL_TABLET | Freq: Every evening | ORAL | 2 refills | Status: DC | PRN

## 2020-06-01 MED ORDER — FLUTICASONE PROPIONATE 50 MCG/ACT NA SUSP: 1.0000 | Freq: Every day | NASAL | 2 refills | Status: DC | PRN

## 2020-06-01 MED ORDER — IBUPROFEN 800 MG PO TABS: 800.0000 mg | ORAL_TABLET | Freq: Two times a day (BID) | ORAL | 2 refills | Status: DC | PRN

## 2020-06-03 ENCOUNTER — Ambulatory Visit: Payer: Medicare Other | Admitting: Physical Therapy

## 2020-06-08 ENCOUNTER — Ambulatory Visit (INDEPENDENT_AMBULATORY_CARE_PROVIDER_SITE_OTHER): Payer: Medicare Other | Admitting: Otolaryngology

## 2020-06-11 ENCOUNTER — Other Ambulatory Visit: Payer: Medicare Other

## 2020-06-11 ENCOUNTER — Encounter: Payer: Self-pay | Admitting: Internal Medicine

## 2020-06-24 ENCOUNTER — Ambulatory Visit: Payer: Medicare Other | Admitting: Internal Medicine

## 2020-06-28 ENCOUNTER — Encounter: Payer: Self-pay | Admitting: Internal Medicine

## 2020-07-06 ENCOUNTER — Other Ambulatory Visit: Payer: Self-pay

## 2020-07-07 ENCOUNTER — Ambulatory Visit (INDEPENDENT_AMBULATORY_CARE_PROVIDER_SITE_OTHER): Payer: Medicare Other | Admitting: Internal Medicine

## 2020-07-07 ENCOUNTER — Encounter: Payer: Self-pay | Admitting: Internal Medicine

## 2020-07-07 VITALS — BP 120/80 | HR 76 | Temp 98.3°F | Wt 151.8 lb

## 2020-07-07 DIAGNOSIS — M79604 Pain in right leg: Secondary | ICD-10-CM

## 2020-07-07 DIAGNOSIS — M79605 Pain in left leg: Secondary | ICD-10-CM | POA: Diagnosis not present

## 2020-07-07 DIAGNOSIS — M797 Fibromyalgia: Secondary | ICD-10-CM

## 2020-07-07 NOTE — Patient Instructions (Signed)
-  Nice seeing you today!!  -Start PT sessions. Follow up with me in 6 weeks if not better to consider a PM and R referral.

## 2020-07-07 NOTE — Progress Notes (Signed)
Established Patient Office Visit     This visit occurred during the SARS-CoV-2 public health emergency.  Safety protocols were in place, including screening questions prior to the visit, additional usage of staff PPE, and extensive cleaning of exam room while observing appropriate contact time as indicated for disinfecting solutions.    CC/Reason for Visit: Follow-up fibromyalgia  HPI: Jordan [name] is a 60 y.o. female who is coming in today for the above mentioned reasons.  She is here today to discuss continued leg pain.  She describes leg pain as bilateral, feels like a tightness particularly in the posterior part of her legs.  She sometimes has upper back and shoulder issues but usually around the back of her legs and calves.  She visited orthopedics, tells me she got some sort of steroid injection in her back (question epidural injection).  He recommended physical therapy, which I had recommended previously before, she has not yet started this.  Her psychiatrist started her again on Adderall 30 mg extended release in the morning with an additional 30 mg immediate release in the afternoon.  Past Medical/Surgical History: Past Medical History:  Diagnosis Date  . Acute upper respiratory infections of unspecified site   . ADD (attention deficit disorder)   . Allergic rhinitis due to pollen   . Anal fissure   . Anemia, unspecified   . Anxiety disorder   . Asthma   . Chicken pox   . Complication of anesthesia    wakes up slow  . Depressive disorder, not elsewhere classified   . Diffuse cystic mastopathy   . Falls   . Fibromyalgia   . GERD (gastroesophageal reflux disease)   . Headache(784.0)   . History of anorexia nervosa   . History of bulimia   . IBS (irritable bowel syndrome)   . 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 Lump or mass in breast   . Meningitis, unspecified(322.9)   . Oral thrush   . Other specified glaucoma   . Perforation of tympanic membrane,  unspecified   . Radial styloid tenosynovitis   . Seizures ([place])     Past Surgical History:  Procedure Laterality Date  . ADENOIDECTOMY    . ANKLE SURGERY  1992   right ankle - scar tissue  . DILATION AND CURETTAGE OF UTERUS  2006   History of Menometrorrhagia  . [place] to 1986   6 surgeries on right ear  . LAPAROSCOPY  1993   normal  . REDUCTION MAMMAPLASTY Bilateral 2018  . uterine ablation  2011  . WRIST SURGERY  2001   for de-quervaine - bilateral wrists - tendonitis    Social History:  reports that she has never smoked. She has never used smokeless tobacco. She reports current alcohol use of about 7.0 standard drinks of alcohol per week. She reports that she does not use drugs.  Allergies: Allergies  Allergen Reactions  . Prednisone Rash    Face redness and swollen per patient  . Pregabalin Other (See Comments)    Delusions  . Remeron [Mirtazapine] Other (See Comments)    Severe hallucinations    Family History:  Family History  Problem Relation Age of Onset  . Ovarian cancer Mother   . Heart disease Mother   . Hypertension Mother   . Coronary artery disease Mother   . Colon polyps Mother 28  . Heart failure Father   . Hypertension Father   . Hyperlipidemia Father   .  Diabetes Father   . Breast cancer Maternal Grandmother   . Colon cancer Neg Hx   . Esophageal cancer Neg Hx   . Stomach cancer Neg Hx   . Rectal cancer Neg Hx      Current Outpatient Medications:  .  ALPRAZolam (XANAX) 0.5 MG tablet, Take 0.5 mg by mouth at bedtime as needed. For sleep.  Also 1/2 tab during the day., Disp: , Rfl:  .  amphetamine-dextroamphetamine (ADDERALL XR) 30 MG 24 hr capsule, Take 30 mg by mouth every morning., Disp: , Rfl:  .  amphetamine-dextroamphetamine (ADDERALL) 30 MG tablet, Take 30 mg by mouth. In the evening, Disp: , Rfl:  .  brexpiprazole (REXULTI) 1 MG TABS tablet, Take 1 tablet by mouth. Every 5 days, Disp: , Rfl:  .  Cetirizine HCl 10 MG  CAPS, Take 1 capsule (10 mg total) by mouth daily for 10 days., Disp: 10 capsule, Rfl: 0 .  cyclobenzaprine (FLEXERIL) 10 MG tablet, Take 1 tablet (10 mg total) by mouth at bedtime as needed., Disp: 30 tablet, Rfl: 2 .  dicyclomine (BENTYL) 10 MG capsule, Take 1 capsule (10 mg total) by mouth every 8 (eight) hours as needed for spasms., Disp: 60 capsule, Rfl: 2 .  DULoxetine (CYMBALTA) 60 MG capsule, Take by mouth 2 (two) times daily. , Disp: , Rfl:  .  esomeprazole (NEXIUM) 40 MG capsule, TAKE 1 CAPSULE(S) BY MOUTH TWO TIMES A DAY BEFORE MEALS, Disp: 180 capsule, Rfl: 1 .  fluticasone (FLONASE) 50 MCG/ACT nasal spray, Place 1 spray into both nostrils daily as needed for allergies or rhinitis., Disp: 16 g, Rfl: 2 .  ibuprofen (ADVIL) 800 MG tablet, Take 1 tablet (800 mg total) by mouth every 12 (twelve) hours as needed., Disp: 30 tablet, Rfl: 2 .  lubiprostone (AMITIZA) 8 MCG capsule, TAKE 1 CAPSULE (8 MCG TOTAL) BY MOUTH 2 (TWO) TIMES DAILY WITH A MEAL., Disp: 60 capsule, Rfl: 3 .  polyethylene glycol powder (GLYCOLAX/MIRALAX) 17 GM/SCOOP powder, Take 17 g by mouth daily., Disp: , Rfl:  .  sucralfate (CARAFATE) 1 g tablet, Take 1 tablet (1 g total) by mouth every 6 (six) hours as needed. Slowly dissolve 1 tablet in 1 Tablespoon of distilled water prior to taking, Disp: 60 tablet, Rfl: 3 .  traZODone (DESYREL) 50 MG tablet, Take 100 mg by mouth at bedtime., Disp: , Rfl:   Review of Systems:  Constitutional: Denies fever, chills, diaphoresis, appetite change and fatigue.  HEENT: Denies photophobia, eye pain, redness, hearing loss, ear pain, congestion, sore throat, rhinorrhea, sneezing, mouth sores, trouble swallowing, neck pain, neck stiffness and tinnitus.   Respiratory: Denies SOB, DOE, cough, chest tightness,  and wheezing.   Cardiovascular: Denies chest pain, palpitations and leg swelling.  Gastrointestinal: Denies nausea, vomiting, abdominal pain, diarrhea, constipation, blood in stool and  abdominal distention.  Genitourinary: Denies dysuria, urgency, frequency, hematuria, flank pain and difficulty urinating.  Endocrine: Denies: hot or cold intolerance, sweats, changes in hair or nails, polyuria, polydipsia. Musculoskeletal: Denies joint swelling, arthralgias and gait problem.  Skin: Denies pallor, rash and wound.  Neurological: Denies dizziness, seizures, syncope, weakness, light-headedness, numbness and headaches.  Hematological: Denies adenopathy. Easy bruising, personal or family bleeding history  Psychiatric/Behavioral: Denies suicidal ideation, mood changes, confusion, nervousness, sleep disturbance and agitation    Physical Exam: Vitals:   07/07/20 1416  BP: 120/80  Pulse: 76  Temp: 98.3 F (36.8 C)  TempSrc: Oral  SpO2: 96%  Weight: 151 lb 12.8 oz (68.9 kg)  Body mass index is 26.89 kg/m.   Constitutional: NAD, calm, comfortable Eyes: PERRL, lids and conjunctivae normal, wears corrective lenses ENMT: Mucous membranes are moist.  Respiratory: clear to auscultation bilaterally, no wheezing, no crackles. Normal respiratory effort. No accessory muscle use.  Cardiovascular: Regular rate and rhythm, no murmurs / rubs / gallops. No extremity edema.  Neurologic: Grossly intact and nonfocal Psychiatric: Normal judgment and insight. Alert and oriented x 3. Normal mood.    Impression and Plan:  Fibromyalgia Bilateral leg pain -Have advised she start PT sessions. -If no improvement after this, I would consider a  physical medicine and rehab referral for further evaluation.   Patient Instructions  -Nice seeing you today!!  -Start PT sessions. Follow up with me in 6 weeks if not better to consider a PM and R referral.     Lelon Frohlich, MD [facility]

## 2020-07-20 ENCOUNTER — Encounter: Payer: Self-pay | Admitting: Internal Medicine

## 2020-07-20 ENCOUNTER — Ambulatory Visit: Payer: Medicare Other

## 2020-07-20 ENCOUNTER — Other Ambulatory Visit: Payer: Self-pay

## 2020-07-20 ENCOUNTER — Ambulatory Visit
Admission: RE | Admit: 2020-07-20 | Discharge: 2020-07-20 | Disposition: A | Discharge status: Home / Self Care | Payer: Medicare Other | Source: Ambulatory Visit | Attending: Internal Medicine | Admitting: Internal Medicine

## 2020-07-20 DIAGNOSIS — M545 Low back pain, unspecified: Secondary | ICD-10-CM

## 2020-07-20 DIAGNOSIS — N63 Unspecified lump in unspecified breast: Secondary | ICD-10-CM

## 2020-08-10 ENCOUNTER — Ambulatory Visit: Payer: Medicare Other | Attending: Internal Medicine | Admitting: Physical Therapy

## 2020-08-10 ENCOUNTER — Encounter: Payer: Self-pay | Admitting: Physical Therapy

## 2020-08-10 ENCOUNTER — Other Ambulatory Visit: Payer: Self-pay

## 2020-08-10 DIAGNOSIS — M25552 Pain in left hip: Secondary | ICD-10-CM | POA: Diagnosis not present

## 2020-08-10 DIAGNOSIS — R252 Cramp and spasm: Secondary | ICD-10-CM

## 2020-08-10 DIAGNOSIS — M6281 Muscle weakness (generalized): Secondary | ICD-10-CM

## 2020-08-10 NOTE — Patient Instructions (Addendum)
Access Code: [email or web address] URL: [email or web address] Date: 08/10/2020 Prepared by: Everardo All  Exercises Supine Single Knee to Chest Stretch (Mirrored) - 2 x daily - 7 x weekly - 1 sets - 2 reps - 20s hold Supine Piriformis Stretch with Leg Straight (Mirrored) - 2 x daily - 7 x weekly - 1 sets - 2 reps - 20s hold Supine Figure 4 Piriformis Stretch (Mirrored) - 2 x daily - 7 x weekly - 1 sets - 2 reps - 20s hold Supine Hip External Rotation Stretch (Mirrored) - 2 x daily - 7 x weekly - 1 sets - 2 reps - 20s hold Supine Bridge with Resistance Band - 1 x daily - 7 x weekly - 2 sets - 10 reps - 2s hold Clamshell with Resistance - 1 x daily - 7 x weekly - 2 sets - 10 reps Clamshell with Resistance (Mirrored) - 1 x daily - 7 x weekly - 2 sets - 10 reps   Trigger Point Dry Needling  What is Trigger Point Dry Needling (DN)? DN is a physical therapy technique used to treat muscle pain and dysfunction. Specifically, DN helps deactivate muscle trigger points (muscle knots).  A thin filiform needle is used to penetrate the skin and stimulate the underlying trigger point. The goal is for a local twitch response (LTR) to occur and for the trigger point to relax. No medication of any kind is injected during the procedure.   What Does Trigger Point Dry Needling Feel Like?  The procedure feels different for each individual patient. Some patients report that they do not actually feel the needle enter the skin and overall the process is not painful. Very mild bleeding may occur. However, many patients feel a deep cramping in the muscle in which the needle was inserted. This is the local twitch response.   How Will I feel after the treatment? Soreness is normal, and the onset of soreness may not occur for a few hours. Typically this soreness does not last longer than two days.  Bruising is uncommon, however; ice can be used to decrease any possible bruising.  In rare cases feeling tired or  nauseous after the treatment is normal. In addition, your symptoms may get worse before they get better, this period will typically not last longer than 24 hours.   What Can I do After My Treatment? Increase your hydration by drinking more water for the next 24 hours. You may place ice or heat on the areas treated that have become sore, however, do not use heat on inflamed or bruised areas. Heat often brings more relief post needling. You can continue your regular activities, but vigorous activity is not recommended initially after the treatment for 24 hours. DN is best combined with other physical therapy such as strengthening, stretching, and other therapies.

## 2020-08-10 NOTE — Therapy (Signed)
[facility] [address], [place], [place], [address] Phone: [phone]   Fax:  [phone]  Physical Therapy Evaluation  Patient Details  Name: Jordan [name] MRN: [id number] Date of Birth: [birth year] Referring Provider (PT): Isaac Bliss, Rayford Halsted, MD   Encounter Date: 08/10/2020   PT End of Session - 08/10/20 1634     Visit Number 1    Date for PT Re-Evaluation 10/05/20    Authorization Type UHC Medicare    Progress Note Due on Visit 10    PT Start Time 1532    PT Stop Time 1615    PT Time Calculation (min) 43 min    Activity Tolerance Patient tolerated treatment well;No increased pain    Behavior During Therapy WFL for tasks assessed/performed             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    Lump or mass in breast    Meningitis, unspecified(322.9)    Oral thrush    Other specified glaucoma    Perforation of tympanic membrane, unspecified    Radial styloid tenosynovitis    Seizures ([place])     Past Surgical History:  Procedure Laterality Date   ADENOIDECTOMY     ANKLE SURGERY  1992   right ankle - scar tissue   DILATION AND CURETTAGE OF UTERUS  2006   History of Menometrorrhagia   INNER EAR SURGERY  1966 to 1986   6 surgeries on right ear   LAPAROSCOPY  1993   normal   REDUCTION MAMMAPLASTY Bilateral 2018   uterine ablation  2011    WRIST SURGERY  2001   for de-quervaine - bilateral wrists - tendonitis    There were no vitals filed for this visit.    Subjective Assessment - 08/10/20 1541     Subjective Patient reports two year history of Lt hip pain. States that pain is worse in the morning and at the end of the day. Feels better when she's up and moving.    Pertinent History MS? (patient states that diagnosis has not been clarified)    Limitations Sitting    How long can you stand comfortably? unlimited    How long can you walk comfortably? unlimited    Diagnostic tests MR 11/2019: anterior superior labral tear of Lt hip with paralabral cyst and Lt hamstring tendinopathy    Patient Stated Goals to not have pain; figure out what this is    Currently in Pain? Yes    Pain Score 5     Pain Location Hip    Pain Orientation Left    Pain Descriptors / Indicators Aching    Pain Type Chronic pain    Pain Onset More than a month ago    Pain Frequency Constant  [facility] PT Assessment - 08/10/20 0001       Assessment   Medical Diagnosis M54.50 (ICD-10-CM) - Back pain, lumbosacral    Referring Provider (PT) Isaac Bliss, Rayford Halsted, MD    Onset Date/Surgical Date --   2 years   Hand Dominance Right    Prior Therapy No      Precautions   Precautions None      Restrictions   Weight Bearing Restrictions No      Balance Screen   Has the patient fallen in the past 6 months No    Has the patient had a decrease in activity level because of a fear of falling?  No    Is the patient reluctant to leave their home because of a fear of falling?  No      Home Ecologist residence    [place] One level      Prior Function   Level of Independence Independent    Vocation Part time employment    Vocation Requirements prolonged standing; works at [employer]    Leisure kayak      Cognition   Overall Cognitive Status Within Functional  Limits for tasks assessed      Observation/Other Assessments   Focus on Therapeutic Outcomes (FOTO)  59      Posture/Postural Control   Posture/Postural Control Postural limitations    Postural Limitations Forward head;Rounded Shoulders      ROM / Strength   AROM / PROM / Strength AROM;Strength      AROM   Overall AROM Comments Lt hip abduction and adduction not impaired    AROM Assessment Site Hip    Right Hip Flexion 91    Right Hip External Rotation  35    Right Hip Internal Rotation  40    Left Hip Flexion 90    Left Hip External Rotation  40    Left Hip Internal Rotation  28      Strength   Strength Assessment Site Hip;Knee;Ankle    Right/Left Hip Right;Left    Right Hip Flexion 4+/5    Right Hip Extension 4+/5    Right Hip External Rotation  5/5    Right Hip Internal Rotation 5/5    Right Hip ABduction 4+/5    Right Hip ADduction 5/5    Left Hip Flexion 4+/5    Left Hip Extension 4+/5    Left Hip External Rotation 4+/5    Left Hip Internal Rotation 4+/5    Left Hip ABduction 4+/5    Left Hip ADduction 5/5    Right/Left Knee Left;Right    Right Knee Flexion 5/5    Right Knee Extension 5/5    Left Knee Flexion 5/5    Left Knee Extension 5/5    Right/Left Ankle Right;Left    Right Ankle Dorsiflexion 5/5    Right Ankle Plantar Flexion 5/5    Left Ankle Dorsiflexion 5/5    Left Ankle Plantar Flexion 5/5      Flexibility   Soft Tissue Assessment /Muscle Length yes    Hamstrings Not impaired Lt/Rt      Palpation   Palpation comment Rt glute med, glute min extremely tender to palpation      Special Tests    Special Tests Hip Special Tests;Sacrolliac Tests    Sacroiliac Tests  Sacral Thrust    Hip Special Tests  Saralyn Pilar (FABER) Test;SI Compression;Hip Scouring  Pelvic Dictraction   Findings Negative      Pelvic Compression   Findings Positive      Sacral thrust    Findings Negative      Saralyn Pilar (FABER) Test   Findings Negative      SI  Compression   Findings Negative      Hip Scouring   Findings Negative      Transfers   Transfers Sit to Stand;Stand to Sit    Sit to Stand 7: Independent    Stand to Sit 7: Independent      Ambulation/Gait   Ambulation/Gait Yes    Assistive device None    Gait Pattern Within Functional Limits      Balance   Balance Assessed Yes      Static Standing Balance   Static Standing Balance -  Activities  Single Leg Stance - Right Leg;Single Leg Stance - Left Leg    Static Standing - Comment/# of Minutes 7 seconds Lt/Rt                        Objective measurements completed on examination: See above findings.               PT Education - 08/10/20 1624     Education Details supine single knee to chest, supine piriformis, supine figure 4, supine hip external rotaiton stretch, bridge with red tband, clamshell    Person(s) Educated Patient    Methods Explanation;Demonstration;Tactile cues;Verbal cues;Handout    Comprehension Verbalized understanding;Returned demonstration;Verbal cues required;Tactile cues required;Need further instruction              PT Short Term Goals - 08/10/20 1631       PT SHORT TERM GOAL #1   Title Patient will be independent with HEP for continued progression at home.    Time 4    Period Weeks    Status New    Target Date 09/07/20      PT SHORT TERM GOAL #2   Title Patient will lift 10# from floor level using proper mechanics x5 reps without increased pain to more readily complete ADLs    Time 4    Period Weeks    Status New    Target Date 09/07/20      PT SHORT TERM GOAL #3   Title Patient will report no more than 2/10 pain for two consecutive sessions to indicate improved activity tolerance.    Baseline 5/10    Time 4    Period Weeks    Status New    Target Date 09/07/20               PT Long Term Goals - 08/10/20 1632       PT LONG TERM GOAL #1   Title Patient will be independent with advanced HEP  for long term management of symptoms post D/C.    Time 8    Period Weeks    Status New    Target Date 10/05/20      PT LONG TERM GOAL #2   Title Patient will improve FOTO score to >/= 68 to indicate improved overall function.    Baseline 60    Time 8    Period Weeks    Status New    Target Date 10/05/20      PT LONG TERM GOAL #3   Title Patient will maintain single limb stance x15s bilaterally to indicate improved pelvic stability for decreased pain  with activity.    Baseline 7s    Time 8    Period Weeks    Status New    Target Date 10/05/20                    Plan - 08/10/20 1625     Clinical Impression Statement Patient is a 60 y/o female referred due to back pain. PMH includes MS however patient states that this diagnosis has not ever been confirmed. Patient reported activity limitations include prolonged standing, prolonged ambulating, and recreation activities. She demonstrates Lt hip AROM impairments. Single leg stance impaired as patient demonstrates Rt hip drop in Lt single leg stance indicating glute med strenght deficits. Additionally patient unable to maintain single leg stance >7s indicating increased fall risk. Would benefit from skilled therapeutic intervention to address impairments for decreased pain and to more readily perform work duties.    Personal Factors and Comorbidities Comorbidity 1;Time since onset of injury/illness/exacerbation    Comorbidities MS    Examination-Activity Limitations Stand;Squat;Lift    Examination-Participation Restrictions Occupation;Community Activity    Stability/Clinical Decision Making Stable/Uncomplicated    Clinical Decision Making Low    Rehab Potential Excellent    PT Frequency 2x / week    PT Duration 8 weeks    PT Treatment/Interventions ADLs/Self Care Home Management;Cryotherapy;Electrical Stimulation;Iontophoresis 4mg /ml Dexamethasone;Moist Heat;Gait training;Stair training;Functional mobility training;Therapeutic  activities;Therapeutic exercise;Balance training;Neuromuscular re-education;Patient/family education;Manual techniques;Passive range of motion;Dry needling;Taping;Spinal Manipulations;Joint Manipulations    PT Next Visit Plan review HEP; hip mobility, core/hip strengthening; dry needling    PT Home Exercise Plan Access Code: [email or web address]    Consulted and Agree with Plan of Care Patient             Patient will benefit from skilled therapeutic intervention in order to improve the following deficits and impairments:  Decreased activity tolerance, Decreased balance, Decreased range of motion, Decreased strength, Pain  Visit Diagnosis: Cramp and spasm - Plan: PT plan of care cert/re-cert  Muscle weakness (generalized) - Plan: PT plan of care cert/re-cert  Pain in left hip - Plan: PT plan of care cert/re-cert     Problem List Patient Active Problem List   Diagnosis Date Noted   Severe recurrent major depression without psychotic features ([place]) 09/27/2015    Class: Chronic   Fibromyalgia 04/03/2013   Back pain, lumbosacral 04/03/2013   Degeneration of cervical intervertebral disc 04/03/2013   Dyspepsia and disorder of function of stomach 03/16/2012   Routine health maintenance 01/03/2012   Mild cognitive impairment with memory loss 01/03/2012   Cough 07/03/2011   Bilateral dry eyes 05/13/2011   Back pain, thoracic 06/15/2010   MULTIPLE SCLEROSIS, PROGRESSIVE/RELAPSING 06/02/2009   Incontinence without sensory awareness 06/01/2009   NASAL POLYP 05/14/2009   Panic disorder without agoraphobia 04/25/2007   Carpal tunnel syndrome 04/25/2007   NECK PAIN, CHRONIC 02/19/2007   HAY FEVER 02/18/2007   ANEMIA-NOS 12/05/2006   Depression, recurrent ([place]) 12/05/2006   GLAUCOMA NEC 12/05/2006   TYMPANIC MEMBRANE PERFORATION 12/05/2006   FIBROCYSTIC BREAST DISEASE 12/05/2006   DE QUERVAIN'S TENOSYNOVITIS 12/05/2006   Headache 12/05/2006   BULIMIA, HX OF 12/05/2006   ANOREXIA, HX OF  12/05/2006   Everardo All PT, DPT  08/10/20 4:38 PM     [facility] [address] [place], [place], [address] Phone: [phone]   Fax:  [phone]  Name: Jordan [name] MRN: [id number] Date of Birth: 16-Oct-1960

## 2020-08-10 NOTE — Telephone Encounter (Signed)
Spoke with patient and she will call back to schedule an appointment if needed.

## 2020-08-12 ENCOUNTER — Encounter: Payer: Medicare Other | Admitting: Physical Therapy

## 2020-08-17 ENCOUNTER — Ambulatory Visit: Payer: Medicare Other | Admitting: Physical Therapy

## 2020-08-19 ENCOUNTER — Encounter: Payer: Medicare Other | Admitting: Physical Therapy

## 2020-08-22 ENCOUNTER — Encounter: Payer: Self-pay | Admitting: Internal Medicine

## 2020-08-22 ENCOUNTER — Other Ambulatory Visit: Payer: Self-pay | Admitting: Internal Medicine

## 2020-08-25 ENCOUNTER — Encounter: Payer: Medicare Other | Admitting: Physical Therapy

## 2020-09-06 ENCOUNTER — Other Ambulatory Visit: Payer: Self-pay

## 2020-09-06 ENCOUNTER — Ambulatory Visit: Payer: Medicare Other | Attending: Internal Medicine

## 2020-09-06 DIAGNOSIS — M6281 Muscle weakness (generalized): Secondary | ICD-10-CM | POA: Diagnosis not present

## 2020-09-06 DIAGNOSIS — M25552 Pain in left hip: Secondary | ICD-10-CM | POA: Diagnosis not present

## 2020-09-06 DIAGNOSIS — R252 Cramp and spasm: Secondary | ICD-10-CM

## 2020-09-06 NOTE — Therapy (Signed)
[facility] [address] [place], [place], [address] Phone: [phone]   Fax:  [phone]  Physical Therapy Treatment  Patient Details  Name: Jordan [name] MRN: [id number] Date of Birth: 1960-05-16 Referring Provider (PT): Isaac Bliss, Rayford Halsted, MD   Encounter Date: 09/06/2020   PT End of Session - 09/06/20 0958     Visit Number 2    Date for PT Re-Evaluation 10/05/20    Authorization Type UHC Medicare    Progress Note Due on Visit 10    PT Start Time 0932    PT Stop Time 1016    PT Time Calculation (min) 44 min    Activity Tolerance Patient tolerated treatment well;No increased pain    Behavior During Therapy WFL for tasks assessed/performed             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    Lump or mass in breast    Meningitis, unspecified(322.9)    Oral thrush    Other specified glaucoma    Perforation of tympanic membrane, unspecified    Radial styloid tenosynovitis    Seizures ([place])     Past Surgical History:  Procedure Laterality Date   ADENOIDECTOMY     ANKLE SURGERY  1992   right ankle - scar tissue   DILATION AND CURETTAGE OF UTERUS  2006   History of Menometrorrhagia   INNER EAR SURGERY  1966 to 1986   6 surgeries on right ear   LAPAROSCOPY  1993   normal   REDUCTION MAMMAPLASTY Bilateral 2018   uterine ablation  2011    WRIST SURGERY  2001   for de-quervaine - bilateral wrists - tendonitis    There were no vitals filed for this visit.   Subjective Assessment - 09/06/20 0936     Subjective I hurt everywhere, that is normal for me.  I haven't been able to do my exercises as well because I had a friend pass away and I was helping with the funeral.    Currently in Pain? Yes    Pain Score 7     Pain Location Hip    Pain Orientation Left    Pain Descriptors / Indicators Aching    Pain Type Chronic pain    Pain Onset More than a month ago    Pain Frequency Constant                               [facility] Adult PT Treatment/Exercise - 09/06/20 0001       Exercises   Exercises Knee/Hip;Lumbar      Lumbar Exercises: Stretches   Passive Hamstring Stretch 3 reps;20 seconds    Piriformis Stretch 2 reps;20 seconds  Figure 4 Stretch 2 reps;20 seconds;Supine      Lumbar Exercises: Aerobic   Nustep Level 1x 8 minutes- PT present to discuss progress      Lumbar Exercises: Supine   Bridge 10 reps;2 seconds    Bridge Limitations red band around thighs      Lumbar Exercises: Sidelying   Clam Both;20 reps    Clam Limitations with and without band      Manual Therapy   Manual Therapy Soft tissue mobilization;Myofascial release    Manual therapy comments skilled palpation and monitoring during dry needling    Soft tissue mobilization elongation to Lt gluteals and ITB              Trigger Point Dry Needling - 09/06/20 0001     Consent Given? Yes    Education Handout Provided Previously provided    Muscles Treated Back/Hip Gluteus medius;Gluteus minimus;Piriformis;Tensor fascia lata   Lt only   Gluteus Minimus Response Twitch response elicited;Palpable increased muscle length    Gluteus Medius Response Twitch response elicited;Palpable increased muscle length    Piriformis Response Twitch response elicited;Palpable increased muscle length    Tensor Fascia Lata Response Twitch  response elicited;Palpable increased muscle length                    PT Short Term Goals - 08/10/20 1631       PT SHORT TERM GOAL #1   Title Patient will be independent with HEP for continued progression at home.    Time 4    Period Weeks    Status New    Target Date 09/07/20      PT SHORT TERM GOAL #2   Title Patient will lift 10# from floor level using proper mechanics x5 reps without increased pain to more readily complete ADLs    Time 4    Period Weeks    Status New    Target Date 09/07/20      PT SHORT TERM GOAL #3   Title Patient will report no more than 2/10 pain for two consecutive sessions to indicate improved activity tolerance.    Baseline 5/10    Time 4    Period Weeks    Status New    Target Date 09/07/20               PT Long Term Goals - 08/10/20 1632       PT LONG TERM GOAL #1   Title Patient will be independent with advanced HEP for long term management of symptoms post D/C.    Time 8    Period Weeks    Status New    Target Date 10/05/20      PT LONG TERM GOAL #2   Title Patient will improve FOTO score to >/= 68 to indicate improved overall function.    Baseline 60    Time 8    Period Weeks    Status New    Target Date 10/05/20      PT LONG TERM GOAL #3   Title Patient will maintain single limb stance x15s bilaterally to indicate improved pelvic stability for decreased pain with activity.    Baseline 7s    Time 8    Period Weeks    Status New    Target Date 10/05/20                   Plan - 09/06/20 0956     Clinical Impression Statement  Pt with first time follow-up after evaluation last week.  Pt has not been able to be compliant with HEP due to a friend passing away and planning the funeral.  Session today focused on review of HEP and PT provided verbal and tactile cues for technique and alignment.  Pt with tension and trigger points in Lt gluteals and ITB and demonstrated improved tissue mobility and had good  twitch response with dry needling today.  Pt will continue to benefit from skilled PT to address lumbopelvic strength, flexibility, tissue mobility and single limb stability.    PT Frequency 2x / week    PT Duration 8 weeks    PT Treatment/Interventions ADLs/Self Care Home Management;Cryotherapy;Electrical Stimulation;Iontophoresis 4mg /ml Dexamethasone;Moist Heat;Gait training;Stair training;Functional mobility training;Therapeutic activities;Therapeutic exercise;Balance training;Neuromuscular re-education;Patient/family education;Manual techniques;Passive range of motion;Dry needling;Taping;Spinal Manipulations;Joint Manipulations    PT Next Visit Plan review HEP; hip mobility, core/hip strengthening; assess response to dry needling    PT Home Exercise Plan Access Code: [email or web address]    Recommended Other Services initial cert is signed    Consulted and Agree with Plan of Care Patient             Patient will benefit from skilled therapeutic intervention in order to improve the following deficits and impairments:  Decreased activity tolerance, Decreased balance, Decreased range of motion, Decreased strength, Pain  Visit Diagnosis: Cramp and spasm  Muscle weakness (generalized)  Pain in left hip     Problem List Patient Active Problem List   Diagnosis Date Noted   Severe recurrent major depression without psychotic features ([place]) 09/27/2015    Class: Chronic   Fibromyalgia 04/03/2013   Back pain, lumbosacral 04/03/2013   Degeneration of cervical intervertebral disc 04/03/2013   Dyspepsia and disorder of function of stomach 03/16/2012   Routine health maintenance 01/03/2012   Mild cognitive impairment with memory loss 01/03/2012   Cough 07/03/2011   Bilateral dry eyes 05/13/2011   Back pain, thoracic 06/15/2010   MULTIPLE SCLEROSIS, PROGRESSIVE/RELAPSING 06/02/2009   Incontinence without sensory awareness 06/01/2009   NASAL POLYP 05/14/2009   Panic disorder without agoraphobia  04/25/2007   Carpal tunnel syndrome 04/25/2007   NECK PAIN, CHRONIC 02/19/2007   HAY FEVER 02/18/2007   ANEMIA-NOS 12/05/2006   Depression, recurrent ([place]) 12/05/2006   GLAUCOMA NEC 12/05/2006   TYMPANIC MEMBRANE PERFORATION 12/05/2006   FIBROCYSTIC BREAST DISEASE 12/05/2006   DE QUERVAIN'S TENOSYNOVITIS 12/05/2006   Headache 12/05/2006   BULIMIA, HX OF 12/05/2006   ANOREXIA, HX OF 12/05/2006   Sigurd Sos, PT 09/06/20 10:17 AM   [facility] [address] [place], [place], [address] Phone: [phone]   Fax:  [phone]  Name: Jahna Liebert MRN: [id number] Date of Birth: [birth year]

## 2020-09-08 ENCOUNTER — Ambulatory Visit: Payer: Medicare Other | Admitting: Physical Therapy

## 2020-09-13 ENCOUNTER — Ambulatory Visit: Payer: Medicare Other

## 2020-09-15 ENCOUNTER — Telehealth: Payer: Self-pay

## 2020-09-15 ENCOUNTER — Ambulatory Visit: Payer: Medicare Other

## 2020-09-15 NOTE — Telephone Encounter (Signed)
PT called pt due to no-show appt today at 9:30

## 2020-09-20 ENCOUNTER — Other Ambulatory Visit: Payer: Self-pay

## 2020-09-20 ENCOUNTER — Encounter: Payer: Self-pay | Admitting: Internal Medicine

## 2020-09-20 ENCOUNTER — Encounter: Payer: Self-pay | Admitting: Physical Therapy

## 2020-09-20 ENCOUNTER — Ambulatory Visit: Payer: Medicare Other | Attending: Internal Medicine | Admitting: Physical Therapy

## 2020-09-20 DIAGNOSIS — R252 Cramp and spasm: Secondary | ICD-10-CM | POA: Diagnosis not present

## 2020-09-20 DIAGNOSIS — G35 Multiple sclerosis: Secondary | ICD-10-CM

## 2020-09-20 DIAGNOSIS — M25559 Pain in unspecified hip: Secondary | ICD-10-CM

## 2020-09-20 DIAGNOSIS — M25552 Pain in left hip: Secondary | ICD-10-CM

## 2020-09-20 DIAGNOSIS — M545 Low back pain, unspecified: Secondary | ICD-10-CM

## 2020-09-20 DIAGNOSIS — M6281 Muscle weakness (generalized): Secondary | ICD-10-CM | POA: Diagnosis not present

## 2020-09-20 NOTE — Therapy (Addendum)
[facility] [address] [place], [place], [address] Phone: [phone]   Fax:  [phone]  Physical Therapy Treatment  Patient Details  Name: Jordan [name] MRN: [id number] Date of Birth: Feb 25, 1960 Referring Provider (PT): Isaac Bliss, Rayford Halsted, MD   Encounter Date: 09/20/2020   PT End of Session - 09/20/20 1230     Visit Number 3    Date for PT Re-Evaluation 10/05/20    Authorization Type [employer] Medicare    PT Start Time 1230    PT Stop Time 1310    PT Time Calculation (min) 40 min    Activity Tolerance Patient tolerated treatment well;No increased pain    Behavior During Therapy WFL for tasks assessed/performed             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    Lump or mass in breast    Meningitis, unspecified(322.9)    Oral thrush    Other specified glaucoma    Perforation of tympanic membrane, unspecified    Radial styloid tenosynovitis    Seizures ([place])     Past Surgical History:  Procedure Laterality Date   ADENOIDECTOMY     ANKLE SURGERY  1992   right ankle - scar tissue   DILATION AND CURETTAGE OF UTERUS  2006   History of Menometrorrhagia   INNER EAR SURGERY  1966 to 1986   6 surgeries on right ear   LAPAROSCOPY  1993   normal   REDUCTION MAMMAPLASTY Bilateral 2018   uterine ablation  2011   WRIST SURGERY  2001   for  de-quervaine - bilateral wrists - tendonitis    There were no vitals filed for this visit.   Subjective Assessment - 09/20/20 1230     Subjective My pain is a "9/10" this AM. This is an increase in intensity and frequency. I was very sore, about  3 days, after the dry neelding.    Currently in Pain? Yes    Pain Score 9     Pain Location Hip    Pain Orientation Left;Lower    Pain Descriptors / Indicators Sharp    Aggravating Factors  Fairly constant    Pain Relieving Factors medication    Multiple Pain Sites No                               [facility] Adult PT Treatment/Exercise - 09/20/20 0001       Self-Care   Self-Care Other Self-Care Comments    Other Self-Care Comments  Sleeping positions that are supporting to LTLE using firm pillows/blankets PTA demo & pt demo correctly      Lumbar Exercises: Stretches   Single Knee to Chest Stretch Left;3 reps;30  seconds      Lumbar Exercises: Supine   Bridge 10 reps;2 seconds    Bridge Limitations red band around thighs      Knee/Hip Exercises: Supine   Other Supine Knee/Hip Exercises LT hip isometrics : 3 sec hold 6x each plane      Knee/Hip Exercises: Sidelying   Clams 2x10 red band                      PT Short Term Goals - 08/10/20 1631       PT SHORT TERM GOAL #1   Title Patient will be independent with HEP for continued progression at home.    Time 4    Period Weeks    Status New    Target Date 09/07/20      PT SHORT TERM GOAL #2   Title Patient will lift 10# from floor level using proper mechanics x5 reps without increased pain to more readily complete ADLs    Time 4    Period Weeks    Status New    Target Date 09/07/20      PT SHORT TERM GOAL #3   Title Patient will report no more than 2/10 pain for two consecutive sessions to indicate improved activity tolerance.    Baseline 5/10    Time 4    Period Weeks    Status New    Target Date 09/07/20               PT Long  Term Goals - 08/10/20 1632       PT LONG TERM GOAL #1   Title Patient will be independent with advanced HEP for long term management of symptoms post D/C.    Time 8    Period Weeks    Status New    Target Date 10/05/20      PT LONG TERM GOAL #2   Title Patient will improve FOTO score to >/= 68 to indicate improved overall function.    Baseline 60    Time 8    Period Weeks    Status New    Target Date 10/05/20      PT LONG TERM GOAL #3   Title Patient will maintain single limb stance x15s bilaterally to indicate improved pelvic stability for decreased pain with activity.    Baseline 7s    Time 8    Period Weeks    Status New    Target Date 10/05/20                   Plan - 09/20/20 1236     Clinical Impression Statement Pt presents today with increased pain, she reports hers intensity and frequency are increasing as of late. Pain was not exacerbated by exercises. Initially we tried hip isometrics but were able to progress to bridges and clamshells without pain. pt is interested in doing aquatics. She belongs to the [employer] but they do not have lifeguards and so the pool is closed alot. She feels she might benefit from aquatics in pain reduction. pt was educated in sleeping positions that will take strain off her LT hip and back.    Personal Factors and Comorbidities Comorbidity 1;Time since onset of injury/illness/exacerbation    Comorbidities MS    Examination-Activity Limitations Stand;Squat;Lift    Examination-Participation Restrictions Occupation;Community Activity    Stability/Clinical Decision Making Stable/Uncomplicated    Rehab Potential Excellent    PT Frequency 2x / week      PT Duration 8 weeks    PT Treatment/Interventions ADLs/Self Care Home Management;Cryotherapy;Electrical Stimulation;Iontophoresis 4mg/ml Dexamethasone;Moist Heat;Gait training;Stair training;Functional mobility training;Therapeutic activities;Therapeutic exercise;Balance training;Neuromuscular  re-education;Patient/family education;Manual techniques;Passive range of motion;Dry needling;Taping;Spinal Manipulations;Joint Manipulations    PT Next Visit Plan DN #2 next, have pt schedule more appts, add aquatics to Pt Treatments if agreeable by PT and route to MD for approval.    PT Home Exercise Plan Access Code: [email or web address]    Consulted and Agree with Plan of Care Patient             Patient will benefit from skilled therapeutic intervention in order to improve the following deficits and impairments:  Decreased activity tolerance, Decreased balance, Decreased range of motion, Decreased strength, Pain  Visit Diagnosis: Cramp and spasm  Muscle weakness (generalized)  Pain in left hip     Problem List Patient Active Problem List   Diagnosis Date Noted   Severe recurrent major depression without psychotic features (HCC) 09/27/2015    Class: Chronic   Fibromyalgia 04/03/2013   Back pain, lumbosacral 04/03/2013   Degeneration of cervical intervertebral disc 04/03/2013   Dyspepsia and disorder of function of stomach 03/16/2012   Routine health maintenance 01/03/2012   Mild cognitive impairment with memory loss 01/03/2012   Cough 07/03/2011   Bilateral dry eyes 05/13/2011   Back pain, thoracic 06/15/2010   MULTIPLE SCLEROSIS, PROGRESSIVE/RELAPSING 06/02/2009   Incontinence without sensory awareness 06/01/2009   NASAL POLYP 05/14/2009   Panic disorder without agoraphobia 04/25/2007   Carpal tunnel syndrome 04/25/2007   NECK PAIN, CHRONIC 02/19/2007   HAY FEVER 02/18/2007   ANEMIA-NOS 12/05/2006   Depression, recurrent (HCC) 12/05/2006   GLAUCOMA NEC 12/05/2006   TYMPANIC MEMBRANE PERFORATION 12/05/2006   FIBROCYSTIC BREAST DISEASE 12/05/2006   DE QUERVAIN'S TENOSYNOVITIS 12/05/2006   Headache 12/05/2006   BULIMIA, HX OF 12/05/2006   ANOREXIA, HX OF 12/05/2006    ,, PTA 09/20/2020, 1:13 PM PHYSICAL THERAPY DISCHARGE SUMMARY  Visits from Start of Care:  3  Current functional level related to goals / functional outcomes: Pt called to cancel all remaining appts.  She is going out of town and will pursue aquatic PT at [facility] when she returns.  She requested D/C at this time.     Remaining deficits: See above for most current PT status.     Education / Equipment: HEP, posture/mechanics    Patient agrees to discharge. Patient goals were not met. Patient is being discharged due to the patient's request.  Kelly Takacs, PT 09/22/20 2:15 PM   [facility] [address] [place], [place], [address] Phone: [phone]   Fax:  [phone]  Name: Janara Ann Crutchfield Musto MRN: [id number] Date of Birth: 03/02/1960    

## 2020-09-22 ENCOUNTER — Encounter: Payer: Medicare Other | Admitting: Physical Therapy

## 2020-09-22 NOTE — Telephone Encounter (Signed)
Referrals placed 

## 2020-09-22 NOTE — Addendum Note (Signed)
Addended by: Westley Hummer B on: 09/22/2020 07:18 AM   Modules accepted: Orders

## 2020-09-30 ENCOUNTER — Ambulatory Visit: Payer: Medicare Other | Admitting: Orthopaedic Surgery

## 2020-10-12 DIAGNOSIS — L821 Other seborrheic keratosis: Secondary | ICD-10-CM | POA: Diagnosis not present

## 2020-10-12 DIAGNOSIS — L820 Inflamed seborrheic keratosis: Secondary | ICD-10-CM | POA: Diagnosis not present

## 2020-10-12 DIAGNOSIS — L905 Scar conditions and fibrosis of skin: Secondary | ICD-10-CM | POA: Diagnosis not present

## 2020-10-13 ENCOUNTER — Ambulatory Visit: Payer: Medicare Other | Admitting: Orthopaedic Surgery

## 2020-10-18 DIAGNOSIS — H40033 Anatomical narrow angle, bilateral: Secondary | ICD-10-CM | POA: Diagnosis not present

## 2020-10-18 DIAGNOSIS — H2513 Age-related nuclear cataract, bilateral: Secondary | ICD-10-CM | POA: Diagnosis not present

## 2020-10-19 ENCOUNTER — Other Ambulatory Visit: Payer: Self-pay | Admitting: Orthopaedic Surgery

## 2020-10-19 ENCOUNTER — Ambulatory Visit: Payer: Medicare Other | Admitting: Orthopaedic Surgery

## 2020-10-19 ENCOUNTER — Encounter: Payer: Self-pay | Admitting: Orthopaedic Surgery

## 2020-10-19 ENCOUNTER — Other Ambulatory Visit: Payer: Self-pay

## 2020-10-19 VITALS — Ht 63.0 in | Wt 145.0 lb

## 2020-10-19 DIAGNOSIS — M545 Low back pain, unspecified: Secondary | ICD-10-CM | POA: Insufficient documentation

## 2020-10-19 DIAGNOSIS — G8929 Other chronic pain: Secondary | ICD-10-CM

## 2020-10-19 MED ORDER — DIAZEPAM 10 MG PO TABS: 10.0000 mg | ORAL_TABLET | Freq: Once | ORAL | 0 refills | Status: DC

## 2020-10-19 MED ORDER — DIAZEPAM 10 MG PO TABS: 10.0000 mg | ORAL_TABLET | Freq: Once | ORAL | 0 refills | Status: AC

## 2020-10-19 NOTE — Progress Notes (Signed)
Office Visit Note   Patient: Jordan [name]           Date of Birth: 1960/04/18           MRN: [id number] Visit Date: 10/19/2020              Requested by: Isaac Bliss, Rayford Halsted, MD [place],  [place] [address] PCP: Isaac Bliss, Rayford Halsted, MD   Assessment & Plan: Visit Diagnoses:  1. Chronic left-sided low back pain, unspecified whether sciatica present   2. Chronic left-sided low back pain without sciatica     Plan: Jordan [name] has been experiencing back and left hip pain for approximately a year and 1/2 to 2 years.  She notes that it first happened when she was unloading a kayak from her car.  She sustained an injury to her right knee with an apparent fracture of the fibular head.  This healed within about a 2-week she began experienced left hip and back pain.  After further evaluation from her primary care physician she had an MRI scan of her left hip performed in October 2021.  She had some mild bilateral hip joint degenerative changes but no foot stress fractures or AVN.  There was evidence of moderate left hamstring tendinopathy and interstitial tears but no intra pelvic abnormalities were identified.  She also had an anterior superior labral tear involving the left hip with an associated small adjacent paralabral cyst.  She notes that she is not experience any groin pain or thigh pain or lateral hip discomfort.  She eventually was seen at [facility] and had an MRI scan of her lumbar spine.  I do not have the results but she notes that she has had a series of injections that were not very helpful.  She presently takes ibuprofen 800 mg twice a day that "helps.  She notes that when she stands or walks she is okay but when she sleeps or sits she will have more discomfort.  The discomfort is localized in the left paralumbar region.  She is really not had any numbness or tingling.  She is also had a course of physical therapy.  She also relates  having history of fibromyalgia and significant depressive episodes.  She is on albuterol and Cymbalta.  Based on all the above and her exam today it certainly seems that the problem is related to her back.  She had painless range of motion of her right hip and no discomfort in her thigh or lateral aspect of her hip.  She had some areas of tenderness in the lumbar spine in the paralumbar region.  I think it is worth repeating the MRI scan.  In the meantime should continue with her above medicines and her exercises.  Follow-Up Instructions: Return After MRI scan lumbar spine.   Orders:  Orders Placed This Encounter  Procedures   MR Lumbar Spine w/o contrast   No orders of the defined types were placed in this encounter.     Procedures: No procedures performed   Clinical Data: No additional findings.   Subjective: Chief Complaint  Patient presents with   Left Hip - Pain  Patient presents today for left hip pain. She said that it started two years ago when she fell from a stool while loading her kayak on her car. Her pain is located at the left upper buttock. No groin pain. No numbness or tingling. She has been evaluated for this before and has  had imaging studies done. She received a couple injections in her back at [facility], which did not help. She takes Ibuprofen if she needs to and that does help.   HPI  Review of Systems   Objective: Vital Signs: Ht '5\' 3"'$  (1.6 m)   Wt 145 lb (65.8 kg)   BMI 25.69 kg/m   Physical Exam Constitutional:      Appearance: She is well-developed.  Eyes:     Pupils: Pupils are equal, round, and reactive to light.  Pulmonary:     Effort: Pulmonary effort is normal.  Skin:    General: Skin is warm and dry.  Neurological:     Mental Status: She is alert and oriented to person, place, and time.  Psychiatric:        Behavior: Behavior normal.    Ortho Exam awake alert and oriented x3.  Comfortable sitting.  A lot of movement when sitting.  She  has had a prior procedure in her right lower extremity for chronic nerve problem.  Apparently she had adipose transposition she notes that that made a difference.  She does not have any loss of sensibility or motor changes in her right lower extremity.  Normal extremity exam left side.  Painless range of motion of both hips.  No loss of motion.  No pain over either greater trochanter.  Did have some areas of tenderness about her lumbar spine which could be related to her back or even possibly her fibromyalgia.  No masses  Specialty Comments:  No specialty comments available.  Imaging: No results found.   PMFS History: Patient Active Problem List   Diagnosis Date Noted   Low back pain 10/19/2020   Severe recurrent major depression without psychotic features ([place]) 09/27/2015    Class: Chronic   Fibromyalgia 04/03/2013   Back pain, lumbosacral 04/03/2013   Degeneration of cervical intervertebral disc 04/03/2013   Dyspepsia and disorder of function of stomach 03/16/2012   Routine health maintenance 01/03/2012   Mild cognitive impairment with memory loss 01/03/2012   Cough 07/03/2011   Bilateral dry eyes 05/13/2011   Back pain, thoracic 06/15/2010   MULTIPLE SCLEROSIS, PROGRESSIVE/RELAPSING 06/02/2009   Incontinence without sensory awareness 06/01/2009   NASAL POLYP 05/14/2009   Panic disorder without agoraphobia 04/25/2007   Carpal tunnel syndrome 04/25/2007   NECK PAIN, CHRONIC 02/19/2007   HAY FEVER 02/18/2007   ANEMIA-NOS 12/05/2006   Depression, recurrent ([place]) 12/05/2006   GLAUCOMA NEC 12/05/2006   TYMPANIC MEMBRANE PERFORATION 12/05/2006   FIBROCYSTIC BREAST DISEASE 12/05/2006   DE QUERVAIN'S TENOSYNOVITIS 12/05/2006   Headache 12/05/2006   BULIMIA, HX OF 12/05/2006   ANOREXIA, HX OF 12/05/2006   Past Medical History:  Diagnosis Date   Acute upper respiratory infections of unspecified site    ADD (attention deficit disorder)    Allergic rhinitis due to pollen    Anal  fissure    Anemia, unspecified    Anxiety disorder    Asthma    Chicken pox    Complication of anesthesia    wakes up slow   Depressive disorder, not elsewhere classified    Diffuse cystic mastopathy    Falls    Fibromyalgia    GERD (gastroesophageal reflux disease)    Headache(784.0)    History of anorexia nervosa    History of bulimia    IBS (irritable bowel syndrome)    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    Lump or mass in breast    Meningitis, unspecified(322.9)  Oral thrush    Other specified glaucoma    Perforation of tympanic membrane, unspecified    Radial styloid tenosynovitis    Seizures ([place])     Family History  Problem Relation Age of Onset   Ovarian cancer Mother    Heart disease Mother    Hypertension Mother    Coronary artery disease Mother    Colon polyps Mother 55   Heart failure Father    Hypertension Father    Hyperlipidemia Father    Diabetes Father    Breast cancer Maternal Grandmother    Colon cancer Neg Hx    Esophageal cancer Neg Hx    Stomach cancer Neg Hx    Rectal cancer Neg Hx     Past Surgical History:  Procedure Laterality Date   ADENOIDECTOMY     ANKLE SURGERY  1992   right ankle - scar tissue   DILATION AND CURETTAGE OF UTERUS  2006   History of Menometrorrhagia   INNER EAR SURGERY  1966 to 1986   6 surgeries on right ear   LAPAROSCOPY  1993   normal   REDUCTION MAMMAPLASTY Bilateral 2018   uterine ablation  2011   WRIST SURGERY  2001   for de-quervaine - bilateral wrists - tendonitis   Social History   Occupational History   Occupation: part time Designer, industrial/product  Tobacco Use   Smoking status: Never   Smokeless tobacco: Never  Vaping Use   Vaping Use: Never used  Substance and Sexual Activity   Alcohol use: Yes    Alcohol/week: 7.0 standard drinks    Types: 7 Glasses of wine per week   Drug use: No   Sexual activity: Not on file

## 2020-10-21 ENCOUNTER — Encounter: Payer: Self-pay | Admitting: Orthopaedic Surgery

## 2020-10-22 ENCOUNTER — Encounter: Payer: Self-pay | Admitting: Internal Medicine

## 2020-10-22 ENCOUNTER — Ambulatory Visit (INDEPENDENT_AMBULATORY_CARE_PROVIDER_SITE_OTHER): Payer: Medicare Other | Admitting: Internal Medicine

## 2020-10-22 ENCOUNTER — Other Ambulatory Visit: Payer: Self-pay

## 2020-10-22 VITALS — BP 120/80 | HR 81 | Temp 98.0°F | Wt 147.9 lb

## 2020-10-22 DIAGNOSIS — F339 Major depressive disorder, recurrent, unspecified: Secondary | ICD-10-CM

## 2020-10-22 DIAGNOSIS — Z23 Encounter for immunization: Secondary | ICD-10-CM | POA: Diagnosis not present

## 2020-10-22 DIAGNOSIS — W57XXXD Bitten or stung by nonvenomous insect and other nonvenomous arthropods, subsequent encounter: Secondary | ICD-10-CM | POA: Diagnosis not present

## 2020-10-22 DIAGNOSIS — S20462D Insect bite (nonvenomous) of left back wall of thorax, subsequent encounter: Secondary | ICD-10-CM | POA: Diagnosis not present

## 2020-10-22 DIAGNOSIS — F410 Panic disorder [episodic paroxysmal anxiety] without agoraphobia: Secondary | ICD-10-CM

## 2020-10-22 NOTE — Addendum Note (Signed)
Addended by: Westley Hummer B on: 10/22/2020 03:33 PM   Modules accepted: Orders

## 2020-10-22 NOTE — Progress Notes (Signed)
Established Patient Office Visit     This visit occurred during the SARS-CoV-2 public health emergency.  Safety protocols were in place, including screening questions prior to the visit, additional usage of staff PPE, and extensive cleaning of exam room while observing appropriate contact time as indicated for disinfecting solutions.    CC/Reason for Visit: Discuss some acute concerns  HPI: Jordan [name] is a 60 y.o. female who is coming in today for the above mentioned reasons.  She has a history of a panic disorder followed by psychiatry.  She had a panic attack last Tuesday.  She has been going through a lot of social stressors.  She had palpitations, diaphoresis, sense of impending doom, took 1/2 tablet of Xanax which worked quite well for her.  She is asking about information on scheduling cognitive behavioral therapy.  She would like to show me what she believes is a bug bite to her left thoracic wall.  It is very pruritic.  She continues to have pain in her left hip.  Epidural injections did not help, she is now seeing Dr. Durward Fortes who would like to do an MRI but is requesting renal function beforehand.   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    Lump or mass in breast    Meningitis, unspecified(322.9)    Oral thrush    Other specified glaucoma    Perforation of tympanic membrane,  unspecified    Radial styloid tenosynovitis    Seizures ([place])     Past Surgical History:  Procedure Laterality Date   ADENOIDECTOMY     ANKLE SURGERY  1992   right ankle - scar tissue   DILATION AND CURETTAGE OF UTERUS  2006   History of Menometrorrhagia   INNER EAR SURGERY  1966 to 1986   6 surgeries on right ear   LAPAROSCOPY  1993   normal   REDUCTION MAMMAPLASTY Bilateral 2018   uterine ablation  2011   WRIST SURGERY  2001   for de-quervaine - bilateral wrists - tendonitis    Social History:  reports that she has never smoked. She has never used smokeless tobacco. She reports current alcohol use of about 7.0 standard drinks per week. She reports that she does not use drugs.  Allergies: Allergies  Allergen Reactions   Prednisone Rash    Face redness and swollen per patient   Pregabalin Other (See Comments)    Delusions   Remeron [Mirtazapine] Other (See Comments)    Severe hallucinations    Family History:  Family History  Problem Relation Age of Onset   Ovarian cancer Mother    Heart disease Mother    Hypertension Mother    Coronary artery disease Mother    Colon polyps Mother 24   Heart  failure Father    Hypertension Father    Hyperlipidemia Father    Diabetes Father    Breast cancer Maternal Grandmother    Colon cancer Neg Hx    Esophageal cancer Neg Hx    Stomach cancer Neg Hx    Rectal cancer Neg Hx      Current Outpatient Medications:    ALPRAZolam (XANAX) 0.5 MG tablet, Take 0.5 mg by mouth at bedtime as needed. For sleep.  Also 1/2 tab during the day., Disp: , Rfl:    amphetamine-dextroamphetamine (ADDERALL XR) 30 MG 24 hr capsule, Take 30 mg by mouth every morning., Disp: , Rfl:    amphetamine-dextroamphetamine (ADDERALL) 30 MG tablet, Take 30 mg by mouth. In the evening, Disp: , Rfl:    brexpiprazole (REXULTI) 1 MG TABS tablet, Take 1 tablet by mouth. Every 5 days, Disp: , Rfl:    Cetirizine HCl 10 MG CAPS, Take 1 capsule (10 mg total) by  mouth daily for 10 days., Disp: 10 capsule, Rfl: 0   cyclobenzaprine (FLEXERIL) 10 MG tablet, Take 1 tablet (10 mg total) by mouth at bedtime as needed., Disp: 30 tablet, Rfl: 2   dicyclomine (BENTYL) 10 MG capsule, Take 1 capsule (10 mg total) by mouth every 8 (eight) hours as needed for spasms., Disp: 60 capsule, Rfl: 2   DULoxetine (CYMBALTA) 60 MG capsule, Take by mouth 2 (two) times daily. , Disp: , Rfl:    esomeprazole (NEXIUM) 40 MG capsule, TAKE 1 CAPSULE(S) BY MOUTH TWO TIMES A DAY BEFORE MEALS, Disp: 180 capsule, Rfl: 1   fluticasone (FLONASE) 50 MCG/ACT nasal spray, Place 1 spray into both nostrils daily as needed for allergies or rhinitis., Disp: 16 g, Rfl: 2   ibuprofen (ADVIL) 800 MG tablet, TAKE 1 TABLET BY MOUTH EVERY 12 HOURS AS NEEDED, Disp: 30 tablet, Rfl: 2   lubiprostone (AMITIZA) 8 MCG capsule, TAKE 1 CAPSULE (8 MCG TOTAL) BY MOUTH 2 (TWO) TIMES DAILY WITH A MEAL., Disp: 60 capsule, Rfl: 3   polyethylene glycol powder (GLYCOLAX/MIRALAX) 17 GM/SCOOP powder, Take 17 g by mouth daily., Disp: , Rfl:    sucralfate (CARAFATE) 1 g tablet, Take 1 tablet (1 g total) by mouth every 6 (six) hours as needed. Slowly dissolve 1 tablet in 1 Tablespoon of distilled water prior to taking, Disp: 60 tablet, Rfl: 3   traZODone (DESYREL) 50 MG tablet, Take 100 mg by mouth at bedtime., Disp: , Rfl:   Review of Systems:  Constitutional: Denies fever, chills, diaphoresis, appetite change and fatigue.  HEENT: Denies photophobia, eye pain, redness, hearing loss, ear pain, congestion, sore throat, rhinorrhea, sneezing, mouth sores, trouble swallowing, neck pain, neck stiffness and tinnitus.   Respiratory: Denies SOB, DOE, cough, chest tightness,  and wheezing.   Cardiovascular: Denies chest pain, palpitations and leg swelling.  Gastrointestinal: Denies nausea, vomiting, abdominal pain, diarrhea, constipation, blood in stool and abdominal distention.  Genitourinary: Denies dysuria, urgency, frequency,  hematuria, flank pain and difficulty urinating.  Endocrine: Denies: hot or cold intolerance, sweats, changes in hair or nails, polyuria, polydipsia. Musculoskeletal: Denies myalgias, back pain, joint swelling, arthralgias and gait problem.  Skin: Denies pallor, rash and wound.  Neurological: Denies dizziness, seizures, syncope, weakness, light-headedness, numbness and headaches.  Hematological: Denies adenopathy. Easy bruising, personal or family bleeding history  Psychiatric/Behavioral: Denies suicidal ideation, mood changes, confusion, nervousness, sleep disturbance and agitation    Physical Exam: Vitals:   10/22/20 1430  BP: 120/80  Pulse: 81  Temp: 98 F (36.7 C)  TempSrc: Oral  SpO2: 98%  Weight: 147 lb 14.4 oz (67.1 kg)    Body mass index is 26.2 kg/m.   Constitutional: NAD, calm, comfortable Eyes: PERRL, lids and conjunctivae normal, wears corrective lenses ENMT: Mucous membranes are moist.  Respiratory: clear to auscultation bilaterally, no wheezing, no crackles. Normal respiratory effort. No accessory muscle use.  Cardiovascular: Regular rate and rhythm, no murmurs / rubs / gallops. No extremity edema.  Neurologic: Grossly intact and nonfocal Psychiatric: Normal judgment and insight. Alert and oriented x 3. Normal mood.    Impression and Plan:  Panic disorder without agoraphobia  Depression, recurrent ([place]) -Have advised that she touch base with her psychiatrist to see if medications need adjustment, she has been given instructions on how to schedule CBT.  Insect bite of left back wall of thorax, subsequent encounter -Very minimal, just a small papule with excoriation from scratching.  Have advised observation or hydrocortisone cream.  -We will order basic metabolic profile for renal function prior to MRI requested by Dr. Durward Fortes.  Time spent: 31 minutes reviewing chart, interviewing and examining patient and formulating plan of care.     Lelon Frohlich, MD  Primary Care at [facility]

## 2020-10-23 LAB — BASIC METABOLIC PANEL
BUN: 15 mg/dL (ref 7–25)
CO2: 26 mmol/L (ref 20–32)
Calcium: 9.4 mg/dL (ref 8.6–10.4)
Chloride: 106 mmol/L (ref 98–110)
Creat: 0.95 mg/dL (ref 0.50–1.05)
Glucose, Bld: 89 mg/dL (ref 65–99)
Potassium: 4.6 mmol/L (ref 3.5–5.3)
Sodium: 141 mmol/L (ref 135–146)

## 2020-10-27 ENCOUNTER — Encounter: Payer: Self-pay | Admitting: Internal Medicine

## 2020-11-06 ENCOUNTER — Other Ambulatory Visit: Payer: Self-pay | Admitting: Gastroenterology

## 2020-11-06 ENCOUNTER — Other Ambulatory Visit: Payer: Self-pay | Admitting: Internal Medicine

## 2020-11-07 ENCOUNTER — Encounter: Payer: Self-pay | Admitting: Orthopaedic Surgery

## 2020-11-09 ENCOUNTER — Other Ambulatory Visit: Payer: Self-pay

## 2020-11-09 ENCOUNTER — Ambulatory Visit
Admission: RE | Admit: 2020-11-09 | Discharge: 2020-11-09 | Disposition: A | Discharge status: Home / Self Care | Payer: Medicare Other | Source: Ambulatory Visit | Attending: Orthopaedic Surgery | Admitting: Orthopaedic Surgery

## 2020-11-09 DIAGNOSIS — M545 Low back pain, unspecified: Secondary | ICD-10-CM

## 2020-11-09 DIAGNOSIS — M48061 Spinal stenosis, lumbar region without neurogenic claudication: Secondary | ICD-10-CM | POA: Diagnosis not present

## 2020-11-09 DIAGNOSIS — G8929 Other chronic pain: Secondary | ICD-10-CM

## 2020-11-10 ENCOUNTER — Encounter: Payer: Self-pay | Admitting: Orthopaedic Surgery

## 2020-11-12 ENCOUNTER — Encounter: Payer: Self-pay | Admitting: Orthopaedic Surgery

## 2020-11-14 ENCOUNTER — Telehealth: Payer: Self-pay

## 2020-11-14 NOTE — Telephone Encounter (Signed)
Called and lvm to schedule AWV with PCP. Sw,cma

## 2020-11-16 ENCOUNTER — Encounter: Payer: Self-pay | Admitting: Orthopaedic Surgery

## 2020-11-16 ENCOUNTER — Other Ambulatory Visit: Payer: Self-pay

## 2020-11-16 ENCOUNTER — Ambulatory Visit: Payer: Medicare Other | Admitting: Orthopaedic Surgery

## 2020-11-16 DIAGNOSIS — G8929 Other chronic pain: Secondary | ICD-10-CM

## 2020-11-16 DIAGNOSIS — M545 Low back pain, unspecified: Secondary | ICD-10-CM

## 2020-11-16 NOTE — Telephone Encounter (Signed)
Refer to Dr Ernestina Patches for either facet injection or ESI

## 2020-11-18 ENCOUNTER — Telehealth: Payer: Self-pay | Admitting: Physical Medicine and Rehabilitation

## 2020-11-18 NOTE — Telephone Encounter (Signed)
Pt would like to get scheduled with Dr.Newton.  CB [phone]. Wondering if she could do Thursday this week coming up.

## 2020-11-22 ENCOUNTER — Encounter: Payer: Self-pay | Admitting: Physical Medicine and Rehabilitation

## 2020-11-22 ENCOUNTER — Ambulatory Visit: Payer: Medicare Other | Admitting: Physical Medicine and Rehabilitation

## 2020-11-22 ENCOUNTER — Other Ambulatory Visit: Payer: Self-pay

## 2020-11-22 VITALS — BP 122/79 | HR 94

## 2020-11-22 DIAGNOSIS — M4726 Other spondylosis with radiculopathy, lumbar region: Secondary | ICD-10-CM

## 2020-11-22 DIAGNOSIS — M5416 Radiculopathy, lumbar region: Secondary | ICD-10-CM | POA: Diagnosis not present

## 2020-11-22 DIAGNOSIS — M48062 Spinal stenosis, lumbar region with neurogenic claudication: Secondary | ICD-10-CM | POA: Diagnosis not present

## 2020-11-22 DIAGNOSIS — M47816 Spondylosis without myelopathy or radiculopathy, lumbar region: Secondary | ICD-10-CM

## 2020-11-22 NOTE — Progress Notes (Signed)
Jordan [name] - 61 y.o. female MRN [id number]  Date of birth: June 10, 1960  Office Visit Note: Visit Date: 11/22/2020 PCP: Isaac Bliss, Rayford Halsted, MD Referred by: Isaac Bliss, Estel*  Subjective: Chief Complaint  Patient presents with   Lower Back - Pain   Left Hip - Pain   HPI: Jordan [name] is a 60 y.o. female who comes in today per the request of Dr. Joni Fears for evaluation of chronic, worsening and severe left sided lower back pain radiating to buttock and hip. Patient reports pain has been chronic for several years, however her pain became worse after a fall while trying to load kayaks into a truck in 2021. Patient also sustained left hip labral tear from this fall. Patient states pain is exacerbated by prolonged sitting and standing, describes as soreness, currently rates as 8 out of 10. Patient reports some pain relief with heat, hot showers and Ibuprofen. Patient's recent lumbar MRI exhibits multi-level facet arthropathy, mild to moderate spinal canal stenosis at L4-L5 and crowding of the subarticular zones at L5-S1 with possible impingement of S1 nerve roots, left more than right. Patient did attend sessions of physical therapy at [place] and reports this did not help to alleviate her pain.  Patient states she is a very active person and plays sports frequently at the [facility]. Patient states the pain is starting to limit her daily activity and is causing her to be more sedentary. Patient denies focal weakness, numbness and tingling. Patient denies recent trauma or falls.   Review of Systems  Musculoskeletal:  Positive for back pain.  Neurological:  Negative for tingling, sensory change, focal weakness and weakness.  All other systems reviewed and are negative. Otherwise per HPI.  Assessment & Plan: Visit Diagnoses:    ICD-10-CM   1. Lumbar radiculopathy  M54.16 Ambulatory referral to Physical Medicine  Rehab    2. Other spondylosis with radiculopathy, lumbar region  M47.26     3. Spinal stenosis of lumbar region with neurogenic claudication  M48.062     4. Facet hypertrophy of lumbar region  M47.816        Plan: Findings:  Chronic, worsening and severe left sided lower back pain radiating to buttock and hip. Patient continues to have excruciating pain despite good conservative therapies such as formal physical therapy and medications. We did review patient's recent lumbar MRI with her in detail today using images and spine model. Patient's clinical presentation and exam are consistent with classic S1 nerve pattern. Dysesthesias are noted to left L5 dermatome upon exam. We feel the next step is to perform a diagnostic and hopefully therapeutic left S1 transforaminal epidural steroid injection. If patient gets good relief of radicular symptoms from epidural steroid injection and continues to have lower back pain we could also consider performing facet joint injections  due to advanced facet arthropathy at L4-L5 and L5-S1. Radiofrequency ablation could also be an option in the future if warranted.  She will continue physical therapy directed home exercise program.  Patient instructed to follow-up with Dr. Joni Fears for chronic knee and hip issues. No red flag symptoms noted upon exam today.    Meds & Orders: No orders of the defined types were placed in this encounter.   Orders Placed This Encounter  Procedures   Ambulatory referral to Physical Medicine Rehab    Follow-up: Return in about 1 week (around 11/29/2020) for Left L1 transforaminal epidural steroid injection.   Procedures:  No procedures performed      Clinical History: MRI LUMBAR SPINE WITHOUT CONTRAST   TECHNIQUE: Multiplanar, multisequence MR imaging of the lumbar spine was performed. No intravenous contrast was administered.   COMPARISON:  CT abdomen/pelvis 04/21/2019   FINDINGS: Segmentation:  Standard; the lowest  disc space is designated L5-S1.   Alignment:  Normal.   Vertebrae: Vertebral body heights are preserved. Marrow signal is normal.   Conus medullaris and cauda equina: Conus extends to the mid L1 level. Conus and cauda equina appear normal.   Paraspinal and other soft tissues: The paraspinal soft tissues are unremarkable.   Disc levels:   There is mild disc desiccation without significant loss of height at L3-L4.   There is multilevel facet arthropathy, most advanced at L4-L5. There is mild perifacetal soft tissue edema centered at the L4-L5 facet joint.   T12-L1: No significant spinal canal or neural foraminal stenosis.   L1-L2: No significant spinal canal or neural foraminal stenosis.   L2-L3: There is a minimal disc bulge without significant spinal canal or neural foraminal stenosis.   L3-L4: There is a mild disc bulge, prominent dorsal epidural fat, and mild bilateral facet arthropathy resulting in mild spinal canal stenosis and mild bilateral neural foraminal stenosis. There is no evidence of nerve root impingement.   L4-L5: There is a mild disc bulge, ligamentum flavum thickening, prominent dorsal epidural fat, and bilateral facet arthropathy resulting in mild-to-moderate spinal canal stenosis with crowding of the subarticular zones and mild right and no significant left neural foraminal stenosis. There is no evidence of nerve root impingement.   L5-S1: There is a mild disc bulge and bilateral facet arthropathy resulting in crowding of the subarticular zones with possible contact of the traversing S1 nerve roots, left more than right.   IMPRESSION: 1. Multilevel facet arthropathy, most advanced at L4-L5 with associated perifacetal soft tissue edema, which could reflect a source of pain. 2. Mild-to-moderate spinal canal stenosis at L4-L5 with crowding of the subarticular zones but no evidence of nerve root impingement. 3. Crowding of the subarticular zones at  L5-S1 with possible contact of the traversing S1 nerve roots, left more than right. 4. Otherwise, no high-grade spinal canal or neural foraminal stenosis and no evidence of nerve root impingement at the remaining levels.     Electronically Signed   By: Valetta Mole M.D.   On: 11/10/2020 10:57   She reports that she has never smoked. She has never used smokeless tobacco.  Recent Labs    04/21/20 0845  HGBA1C 5.6    Objective:  VS:  HT:    WT:   BMI:     BP:122/79  HR:94bpm  TEMP: ( )  RESP:  Physical Exam HENT:     Head: Normocephalic.     Right Ear: Tympanic membrane normal.     Left Ear: Tympanic membrane normal.     Nose: Nose normal.     Mouth/Throat:     Mouth: Mucous membranes are moist.  Eyes:     Pupils: Pupils are equal, round, and reactive to light.  Cardiovascular:     Rate and Rhythm: Normal rate.     Pulses: Normal pulses.  Pulmonary:     Effort: Pulmonary effort is normal.  Abdominal:     General: Abdomen is flat. There is no distension.  Musculoskeletal:        General: Tenderness present.     Cervical back: Normal range of motion.     Comments: Pt  is slow to rise from seated position to standing without difficulty. Good lumbar range of motion. Strong distal strength without clonus, no pain upon palpation of greater trochanters. Sensation intact bilaterally. Dysesthesias noted to left S1 dermatome. Walks independently, gait steady.     Skin:    General: Skin is warm and dry.     Capillary Refill: Capillary refill takes less than 2 seconds.  Neurological:     General: No focal deficit present.     Mental Status: She is alert.  Psychiatric:        Mood and Affect: Mood normal.    Ortho Exam  Imaging: No results found.  Past Medical/Family/Surgical/Social History: Medications & Allergies reviewed per EMR, new medications updated. Patient Active Problem List   Diagnosis Date Noted   Low back pain 10/19/2020   Severe recurrent major  depression without psychotic features ([place]) 09/27/2015    Class: Chronic   Fibromyalgia 04/03/2013   Back pain, lumbosacral 04/03/2013   Degeneration of cervical intervertebral disc 04/03/2013   Dyspepsia and disorder of function of stomach 03/16/2012   Routine health maintenance 01/03/2012   Mild cognitive impairment with memory loss 01/03/2012   Cough 07/03/2011   Bilateral dry eyes 05/13/2011   Back pain, thoracic 06/15/2010   MULTIPLE SCLEROSIS, PROGRESSIVE/RELAPSING 06/02/2009   Incontinence without sensory awareness 06/01/2009   NASAL POLYP 05/14/2009   Panic disorder without agoraphobia 04/25/2007   Carpal tunnel syndrome 04/25/2007   NECK PAIN, CHRONIC 02/19/2007   HAY FEVER 02/18/2007   ANEMIA-NOS 12/05/2006   Depression, recurrent ([place]) 12/05/2006   GLAUCOMA NEC 12/05/2006   TYMPANIC MEMBRANE PERFORATION 12/05/2006   FIBROCYSTIC BREAST DISEASE 12/05/2006   DE QUERVAIN'S TENOSYNOVITIS 12/05/2006   Headache 12/05/2006   BULIMIA, HX OF 12/05/2006   ANOREXIA, HX OF 12/05/2006   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    Lump or mass in breast    Meningitis, unspecified(322.9)    Oral thrush    Other specified glaucoma    Perforation of tympanic membrane, unspecified    Radial styloid tenosynovitis    Seizures ([place])    Family History  Problem Relation Age of Onset   Ovarian cancer Mother    Heart disease Mother     Hypertension Mother    Coronary artery disease Mother    Colon polyps Mother 14   Heart failure Father    Hypertension Father    Hyperlipidemia Father    Diabetes Father    Breast cancer Maternal Grandmother    Colon cancer Neg Hx    Esophageal cancer Neg Hx    Stomach cancer Neg Hx    Rectal cancer Neg Hx    Past Surgical History:  Procedure Laterality Date   ADENOIDECTOMY     ANKLE SURGERY  1992   right ankle -  scar tissue   DILATION AND CURETTAGE OF UTERUS  2006   History of Menometrorrhagia   INNER EAR SURGERY  1966 to 1986   6 surgeries on right ear   LAPAROSCOPY  1993   normal   REDUCTION MAMMAPLASTY Bilateral 2018   uterine ablation  2011   WRIST SURGERY  2001   for de-quervaine - bilateral wrists - tendonitis   Social History   Occupational History   Occupation: part time Designer, industrial/product  Tobacco Use   Smoking status: Never   Smokeless tobacco: Never  Vaping Use   Vaping Use: Never used  Substance and Sexual Activity   Alcohol use: Yes    Alcohol/week: 7.0 standard drinks    Types: 7 Glasses of wine per week   Drug use: No   Sexual activity: Not on file

## 2020-11-22 NOTE — Progress Notes (Signed)
Pt state lower back pain the travels to her left hip. Pt state sitting, laying down and bending over makes the pain worse. Pt state takes pain meds to help ease her pain.  Numeric Pain Rating Scale and Functional Assessment Average Pain 10 Pain Right Now 5 My pain is constant, dull, stabbing, and aching Pain is worse with: bending, sitting, standing, and some activites Pain improves with: medication   In the last MONTH (on 0-10 scale) has pain interfered with the following?  1. General activity like being  able to carry out your everyday physical activities such as walking, climbing stairs, carrying groceries, or moving a chair?  Rating(7)  2. Relation with others like being able to carry out your usual social activities and roles such as  activities at home, at work and in your community. Rating(8)  3. Enjoyment of life such that you have  been bothered by emotional problems such as feeling anxious, depressed or irritable?  Rating(9)

## 2020-11-24 ENCOUNTER — Other Ambulatory Visit: Payer: Self-pay | Admitting: Internal Medicine

## 2020-11-24 ENCOUNTER — Encounter: Payer: Self-pay | Admitting: Physical Medicine and Rehabilitation

## 2020-11-24 DIAGNOSIS — M545 Low back pain, unspecified: Secondary | ICD-10-CM

## 2020-11-24 NOTE — Telephone Encounter (Signed)
Left message #2 to reschedule appointment.

## 2020-11-25 ENCOUNTER — Ambulatory Visit: Payer: Medicare Other | Admitting: Physical Medicine and Rehabilitation

## 2020-11-29 ENCOUNTER — Encounter: Payer: Self-pay | Admitting: Orthopaedic Surgery

## 2020-11-30 ENCOUNTER — Encounter: Payer: Self-pay | Admitting: Physical Medicine and Rehabilitation

## 2020-12-01 MED ORDER — ACETAMINOPHEN-CODEINE #3 300-30 MG PO TABS: 1.0000 | ORAL_TABLET | Freq: Three times a day (TID) | ORAL | 0 refills | Status: AC | PRN

## 2020-12-02 ENCOUNTER — Ambulatory Visit: Payer: Medicare Other | Admitting: Orthopaedic Surgery

## 2020-12-07 ENCOUNTER — Telehealth: Payer: Self-pay | Admitting: Physical Medicine and Rehabilitation

## 2020-12-07 ENCOUNTER — Ambulatory Visit: Payer: Self-pay | Admitting: Psychologist

## 2020-12-07 NOTE — Telephone Encounter (Signed)
Called patient and rescheduled appointment

## 2020-12-07 NOTE — Telephone Encounter (Signed)
Patient called. She would like to RSC her appointment with Dr. Newton 

## 2020-12-08 ENCOUNTER — Ambulatory Visit: Payer: Medicare Other | Admitting: Physical Medicine and Rehabilitation

## 2020-12-09 ENCOUNTER — Ambulatory Visit: Payer: Medicare Other | Admitting: Internal Medicine

## 2020-12-16 ENCOUNTER — Other Ambulatory Visit: Payer: Self-pay

## 2020-12-16 ENCOUNTER — Ambulatory Visit: Payer: Self-pay

## 2020-12-16 ENCOUNTER — Ambulatory Visit (INDEPENDENT_AMBULATORY_CARE_PROVIDER_SITE_OTHER): Payer: Medicare Other | Admitting: Physical Medicine and Rehabilitation

## 2020-12-16 ENCOUNTER — Encounter: Payer: Self-pay | Admitting: Physical Medicine and Rehabilitation

## 2020-12-16 VITALS — BP 124/85 | HR 93

## 2020-12-16 DIAGNOSIS — M5416 Radiculopathy, lumbar region: Secondary | ICD-10-CM

## 2020-12-16 MED ORDER — METHYLPREDNISOLONE ACETATE 80 MG/ML IJ SUSP
80.0000 mg | Freq: Once | INTRAMUSCULAR | Status: AC
  Administered 2020-12-16: 80 mg

## 2020-12-16 NOTE — Patient Instructions (Signed)

## 2020-12-16 NOTE — Progress Notes (Signed)
Pt state lower back pain the travels to her left hip. Pt state sitting, laying down and bending over makes the pain worse. Pt state takes pain meds to help ease her pain.  Numeric Pain Rating Scale and Functional Assessment Average Pain 6   In the last MONTH (on 0-10 scale) has pain interfered with the following?  1. General activity like being  able to carry out your everyday physical activities such as walking, climbing stairs, carrying groceries, or moving a chair?  Rating(10)   +Driver, -BT, -Dye Allergies.

## 2020-12-19 ENCOUNTER — Encounter: Payer: Self-pay | Admitting: Physical Medicine and Rehabilitation

## 2020-12-19 DIAGNOSIS — M5416 Radiculopathy, lumbar region: Secondary | ICD-10-CM

## 2020-12-21 ENCOUNTER — Ambulatory Visit: Payer: Self-pay | Admitting: Psychologist

## 2020-12-22 NOTE — Procedures (Signed)
S1 Lumbosacral Transforaminal Epidural Steroid Injection - Sub-Pedicular Approach with Fluoroscopic Guidance   Patient: Jordan [name] [name]      Date of Birth: [birth year] MRN: [id number] PCP: Isaac Bliss, Rayford Halsted, MD      Visit Date: 12/16/2020   Universal Protocol:    Date/Time: 11/02/225:53 AM  Consent Given By: the patient  Position:  PRONE  Additional Comments: Vital signs were monitored before and after the procedure. Patient was prepped and draped in the usual sterile fashion. The correct patient, procedure, and site was verified.   Injection Procedure Details:  Procedure Site One Meds Administered:  Meds ordered this encounter  Medications   methylPREDNISolone acetate (DEPO-MEDROL) injection 80 mg    Laterality: Left  Location/Site:  S1 Foramen   Needle size: 22 ga.  Needle type: Spinal  Needle Placement: Transforaminal  Findings:   -Comments: Excellent flow of contrast along the nerve, nerve root and into the epidural space.  Epidurogram: Contrast epidurogram showed no nerve root cut off or restricted flow pattern.  Procedure Details: After squaring off the sacral end-plate to get a true AP view, the C-arm was positioned so that the best possible view of the S1 foramen was visualized. The soft tissues overlying this structure were infiltrated with 2-3 ml. of 1% Lidocaine without Epinephrine.    The spinal needle was inserted toward the target using a "trajectory" view along the fluoroscope beam.  Under AP and lateral visualization, the needle was advanced so it did not puncture dura. Biplanar projections were used to confirm position. Aspiration was confirmed to be negative for CSF and/or blood. A 1-2 ml. volume of Isovue-250 was injected and flow of contrast was noted at each level. Radiographs were obtained for documentation purposes.   After attaining the desired flow of contrast documented above, a 0.5 to 1.0 ml test dose of 0.25% Marcaine  was injected into each respective transforaminal space.  The patient was observed for 90 seconds post injection.  After no sensory deficits were reported, and normal lower extremity motor function was noted,   the above injectate was administered so that equal amounts of the injectate were placed at each foramen (level) into the transforaminal epidural space.   Additional Comments:  No complications occurred Dressing: Band-Aid with 2 x 2 sterile gauze    Post-procedure details: Patient was observed during the procedure. Post-procedure instructions were reviewed.  Patient left the clinic in stable condition.

## 2020-12-22 NOTE — Progress Notes (Signed)
Jordan [name] - 60 y.o. female MRN [id number]  Date of birth: 08-19-1960  Office Visit Note: Visit Date: 12/16/2020 PCP: Isaac Bliss, Rayford Halsted, MD Referred by: Isaac Bliss, Estel*  Subjective: Chief Complaint  Patient presents with   Lower Back - Pain   HPI:  Jordan [name] is a 60 y.o. female who comes in today at the request of Barnet Pall, FNP for planned Left S1-2 Lumbar Transforaminal epidural steroid injection with fluoroscopic guidance.  The patient has failed conservative care including home exercise, medications, time and activity modification.  This injection will be diagnostic and hopefully therapeutic.  Please see requesting physician notes for further details and justification.   ROS Otherwise per HPI.  Assessment & Plan: Visit Diagnoses:    ICD-10-CM   1. Lumbar radiculopathy  M54.16 XR C-ARM NO REPORT    Epidural Steroid injection    methylPREDNISolone acetate (DEPO-MEDROL) injection 80 mg      Plan: No additional findings.   Meds & Orders:  Meds ordered this encounter  Medications   methylPREDNISolone acetate (DEPO-MEDROL) injection 80 mg    Orders Placed This Encounter  Procedures   XR C-ARM NO REPORT   Epidural Steroid injection    Follow-up: Return if symptoms worsen or fail to improve.   Procedures: No procedures performed  S1 Lumbosacral Transforaminal Epidural Steroid Injection - Sub-Pedicular Approach with Fluoroscopic Guidance   Patient: Jordan [name]      Date of Birth: 05-03-1960 MRN: [id number] PCP: Isaac Bliss, Rayford Halsted, MD      Visit Date: 12/16/2020   Universal Protocol:    Date/Time: 11/02/225:53 AM  Consent Given By: the patient  Position:  PRONE  Additional Comments: Vital signs were monitored before and after the procedure. Patient was prepped and draped in the usual sterile fashion. The correct patient, procedure, and site was verified.   Injection Procedure  Details:  Procedure Site One Meds Administered:  Meds ordered this encounter  Medications   methylPREDNISolone acetate (DEPO-MEDROL) injection 80 mg    Laterality: Left  Location/Site:  S1 Foramen   Needle size: 22 ga.  Needle type: Spinal  Needle Placement: Transforaminal  Findings:   -Comments: Excellent flow of contrast along the nerve, nerve root and into the epidural space.  Epidurogram: Contrast epidurogram showed no nerve root cut off or restricted flow pattern.  Procedure Details: After squaring off the sacral end-plate to get a true AP view, the C-arm was positioned so that the best possible view of the S1 foramen was visualized. The soft tissues overlying this structure were infiltrated with 2-3 ml. of 1% Lidocaine without Epinephrine.    The spinal needle was inserted toward the target using a "trajectory" view along the fluoroscope beam.  Under AP and lateral visualization, the needle was advanced so it did not puncture dura. Biplanar projections were used to confirm position. Aspiration was confirmed to be negative for CSF and/or blood. A 1-2 ml. volume of Isovue-250 was injected and flow of contrast was noted at each level. Radiographs were obtained for documentation purposes.   After attaining the desired flow of contrast documented above, a 0.5 to 1.0 ml test dose of 0.25% Marcaine was injected into each respective transforaminal space.  The patient was observed for 90 seconds post injection.  After no sensory deficits were reported, and normal lower extremity motor function was noted,   the above injectate was administered so that equal amounts of the injectate were placed at each  foramen (level) into the transforaminal epidural space.   Additional Comments:  No complications occurred Dressing: Band-Aid with 2 x 2 sterile gauze    Post-procedure details: Patient was observed during the procedure. Post-procedure instructions were reviewed.  Patient left the  clinic in stable condition.    Clinical History: MRI LUMBAR SPINE WITHOUT CONTRAST   TECHNIQUE: Multiplanar, multisequence MR imaging of the lumbar spine was performed. No intravenous contrast was administered.   COMPARISON:  CT abdomen/pelvis 04/21/2019   FINDINGS: Segmentation:  Standard; the lowest disc space is designated L5-S1.   Alignment:  Normal.   Vertebrae: Vertebral body heights are preserved. Marrow signal is normal.   Conus medullaris and cauda equina: Conus extends to the mid L1 level. Conus and cauda equina appear normal.   Paraspinal and other soft tissues: The paraspinal soft tissues are unremarkable.   Disc levels:   There is mild disc desiccation without significant loss of height at L3-L4.   There is multilevel facet arthropathy, most advanced at L4-L5. There is mild perifacetal soft tissue edema centered at the L4-L5 facet joint.   T12-L1: No significant spinal canal or neural foraminal stenosis.   L1-L2: No significant spinal canal or neural foraminal stenosis.   L2-L3: There is a minimal disc bulge without significant spinal canal or neural foraminal stenosis.   L3-L4: There is a mild disc bulge, prominent dorsal epidural fat, and mild bilateral facet arthropathy resulting in mild spinal canal stenosis and mild bilateral neural foraminal stenosis. There is no evidence of nerve root impingement.   L4-L5: There is a mild disc bulge, ligamentum flavum thickening, prominent dorsal epidural fat, and bilateral facet arthropathy resulting in mild-to-moderate spinal canal stenosis with crowding of the subarticular zones and mild right and no significant left neural foraminal stenosis. There is no evidence of nerve root impingement.   L5-S1: There is a mild disc bulge and bilateral facet arthropathy resulting in crowding of the subarticular zones with possible contact of the traversing S1 nerve roots, left more than right.   IMPRESSION: 1.  Multilevel facet arthropathy, most advanced at L4-L5 with associated perifacetal soft tissue edema, which could reflect a source of pain. 2. Mild-to-moderate spinal canal stenosis at L4-L5 with crowding of the subarticular zones but no evidence of nerve root impingement. 3. Crowding of the subarticular zones at L5-S1 with possible contact of the traversing S1 nerve roots, left more than right. 4. Otherwise, no high-grade spinal canal or neural foraminal stenosis and no evidence of nerve root impingement at the remaining levels.     Electronically Signed   By: Valetta Mole M.D.   On: 11/10/2020 10:57     Objective:  VS:  HT:    WT:   BMI:     BP:124/85  HR:93bpm  TEMP: ( )  RESP:  Physical Exam Vitals and nursing note reviewed.  Constitutional:      General: She is not in acute distress.    Appearance: Normal appearance. She is not ill-appearing.  HENT:     Head: Normocephalic and atraumatic.     Right Ear: External ear normal.     Left Ear: External ear normal.  Eyes:     Extraocular Movements: Extraocular movements intact.  Cardiovascular:     Rate and Rhythm: Normal rate.     Pulses: Normal pulses.  Pulmonary:     Effort: Pulmonary effort is normal. No respiratory distress.  Abdominal:     General: There is no distension.     Palpations:  Abdomen is soft.  Musculoskeletal:        General: Tenderness present.     Cervical back: Neck supple.     Right lower leg: No edema.     Left lower leg: No edema.     Comments: Patient has good distal strength with no pain over the greater trochanters.  No clonus or focal weakness.  Skin:    Findings: No erythema, lesion or rash.  Neurological:     General: No focal deficit present.     Mental Status: She is alert and oriented to person, place, and time.     Sensory: No sensory deficit.     Motor: No weakness or abnormal muscle tone.     Coordination: Coordination normal.  Psychiatric:        Mood and Affect: Mood normal.         Behavior: Behavior normal.     Imaging: No results found.

## 2020-12-27 ENCOUNTER — Other Ambulatory Visit: Payer: Self-pay

## 2020-12-27 ENCOUNTER — Ambulatory Visit: Payer: Self-pay

## 2020-12-27 ENCOUNTER — Encounter: Payer: Self-pay | Admitting: Physical Medicine and Rehabilitation

## 2020-12-27 ENCOUNTER — Ambulatory Visit (INDEPENDENT_AMBULATORY_CARE_PROVIDER_SITE_OTHER): Payer: Medicare Other | Admitting: Physical Medicine and Rehabilitation

## 2020-12-27 VITALS — BP 125/84 | HR 89

## 2020-12-27 DIAGNOSIS — S73192S Other sprain of left hip, sequela: Secondary | ICD-10-CM

## 2020-12-27 DIAGNOSIS — M5416 Radiculopathy, lumbar region: Secondary | ICD-10-CM

## 2020-12-27 DIAGNOSIS — M797 Fibromyalgia: Secondary | ICD-10-CM

## 2020-12-27 DIAGNOSIS — M25552 Pain in left hip: Secondary | ICD-10-CM | POA: Diagnosis not present

## 2020-12-27 MED ORDER — BETAMETHASONE SOD PHOS & ACET 6 (3-3) MG/ML IJ SUSP: 12.0000 mg | Freq: Once | INTRAMUSCULAR | Status: DC

## 2020-12-27 NOTE — Progress Notes (Signed)
Pt state left hip pain. Pt state sitting and laying down makes the pain worse. Pt state she takes over the counter pain meds and excise to help ease her pain. Pt has hx of inj on 12/16/20 pt state it helped. Pt state it not her back but its in her left hip.   Numeric Pain Rating Scale and Functional Assessment Average Pain 8   In the last MONTH (on 0-10 scale) has pain interfered with the following?  1. General activity like being  able to carry out your everyday physical activities such as walking, climbing stairs, carrying groceries, or moving a chair?  Rating(10)   +Driver, -BT, -Dye Allergies.

## 2020-12-27 NOTE — Progress Notes (Signed)
Jordan [name] - 60 y.o. female MRN [id number]  Date of birth: [birth year]  Office Visit Note: Visit Date: 12/27/2020 PCP: Isaac Bliss, Rayford Halsted, MD Referred by: Isaac Bliss, Estel*  Subjective: Chief Complaint  Patient presents with   Left Hip - Pain   HPI:  Jordan [name] is a 60 y.o. female who comes in todaystatus post left S1 transforaminal epidural steroid injection without much relief at all.  This seems to have declared itself to some degree she is getting some hip and groin pain at this point with movement.  We are going to try diagnostic hip injection today intra-articularly with fluoroscopic guidance.  Please see our prior notes for further details and justification.     ROS Otherwise per HPI.  Assessment & Plan: Visit Diagnoses:    ICD-10-CM   1. Lumbar radiculopathy  M54.16     2. Pain in left hip  M25.552 XR C-ARM NO REPORT    betamethasone acetate-betamethasone sodium phosphate (CELESTONE) injection 12 mg    Large Joint Inj: L hip joint    CANCELED: Epidural Steroid injection    3. Tear of left acetabular labrum, sequela  S73.192S     4. Fibromyalgia  M79.7       Plan: No additional findings.   Meds & Orders:  Meds ordered this encounter  Medications   betamethasone acetate-betamethasone sodium phosphate (CELESTONE) injection 12 mg    Orders Placed This Encounter  Procedures   Large Joint Inj: L hip joint   XR C-ARM NO REPORT    Follow-up: Return for visit to requesting physician as needed.   Procedures: Large Joint Inj: L hip joint on 12/27/2020 8:23 AM Indications: diagnostic evaluation and pain Details: 22 G 3.5 in needle, fluoroscopy-guided anterior approach  Arthrogram: No  Medications: 4 mL bupivacaine 0.25 %; 60 mg triamcinolone acetonide 40 MG/ML Outcome: tolerated well, no immediate complications  There was excellent flow of contrast producing a partial arthrogram of the hip. The patient did have  relief of symptoms during the anesthetic phase of the injection. Procedure, treatment alternatives, risks and benefits explained, specific risks discussed. Consent was given by the patient. Immediately prior to procedure a time out was called to verify the correct patient, procedure, equipment, support staff and site/side marked as required. Patient was prepped and draped in the usual sterile fashion.         Clinical History: MRI LUMBAR SPINE WITHOUT CONTRAST   TECHNIQUE: Multiplanar, multisequence MR imaging of the lumbar spine was performed. No intravenous contrast was administered.   COMPARISON:  CT abdomen/pelvis 04/21/2019   FINDINGS: Segmentation:  Standard; the lowest disc space is designated L5-S1.   Alignment:  Normal.   Vertebrae: Vertebral body heights are preserved. Marrow signal is normal.   Conus medullaris and cauda equina: Conus extends to the mid L1 level. Conus and cauda equina appear normal.   Paraspinal and other soft tissues: The paraspinal soft tissues are unremarkable.   Disc levels:   There is mild disc desiccation without significant loss of height at L3-L4.   There is multilevel facet arthropathy, most advanced at L4-L5. There is mild perifacetal soft tissue edema centered at the L4-L5 facet joint.   T12-L1: No significant spinal canal or neural foraminal stenosis.   L1-L2: No significant spinal canal or neural foraminal stenosis.   L2-L3: There is a minimal disc bulge without significant spinal canal or neural foraminal stenosis.   L3-L4: There is a mild disc bulge, prominent  dorsal epidural fat, and mild bilateral facet arthropathy resulting in mild spinal canal stenosis and mild bilateral neural foraminal stenosis. There is no evidence of nerve root impingement.   L4-L5: There is a mild disc bulge, ligamentum flavum thickening, prominent dorsal epidural fat, and bilateral facet arthropathy resulting in mild-to-moderate spinal canal  stenosis with crowding of the subarticular zones and mild right and no significant left neural foraminal stenosis. There is no evidence of nerve root impingement.   L5-S1: There is a mild disc bulge and bilateral facet arthropathy resulting in crowding of the subarticular zones with possible contact of the traversing S1 nerve roots, left more than right.   IMPRESSION: 1. Multilevel facet arthropathy, most advanced at L4-L5 with associated perifacetal soft tissue edema, which could reflect a source of pain. 2. Mild-to-moderate spinal canal stenosis at L4-L5 with crowding of the subarticular zones but no evidence of nerve root impingement. 3. Crowding of the subarticular zones at L5-S1 with possible contact of the traversing S1 nerve roots, left more than right. 4. Otherwise, no high-grade spinal canal or neural foraminal stenosis and no evidence of nerve root impingement at the remaining levels.     Electronically Signed   By: Valetta Mole M.D.   On: 11/10/2020 10:57     Objective:  VS:  HT:    WT:   BMI:     BP:125/84  HR:89bpm  TEMP: ( )  RESP:  Physical Exam   Imaging: No results found.

## 2021-01-01 ENCOUNTER — Encounter: Payer: Self-pay | Admitting: Physical Medicine and Rehabilitation

## 2021-01-02 MED ORDER — BUPIVACAINE HCL 0.25 % IJ SOLN
4.0000 mL | INTRAMUSCULAR | Status: AC | PRN
  Administered 2020-12-27: 4 mL via INTRA_ARTICULAR

## 2021-01-02 MED ORDER — TRIAMCINOLONE ACETONIDE 40 MG/ML IJ SUSP
60.0000 mg | INTRAMUSCULAR | Status: AC | PRN
  Administered 2020-12-27: 60 mg via INTRA_ARTICULAR

## 2021-01-09 ENCOUNTER — Other Ambulatory Visit: Payer: Self-pay | Admitting: Internal Medicine

## 2021-01-12 ENCOUNTER — Ambulatory Visit: Payer: Medicare Other | Admitting: Psychology

## 2021-01-25 ENCOUNTER — Encounter: Payer: Self-pay | Admitting: Orthopaedic Surgery

## 2021-01-26 ENCOUNTER — Encounter: Payer: Medicare Other | Admitting: Psychology

## 2021-01-26 NOTE — Progress Notes (Signed)
Pt called to cancel due to injuring her back.

## 2021-01-26 NOTE — Progress Notes (Deleted)
[place] Counsel[facility] Initial Adult Exam  Name: Jordan [name] Date: 01/26/2021 MRN: [id number] DOB: [birth year] PCP: Isaac Bliss, Rayford Halsted, MD  Time spent: 49  Guardian/Payee:      Paperwork requested: No   Reason for Visit /Presenting Problem: anxiety  Mental Status Exam: Appearance:   Well Groomed     Behavior:  Appropriate  Motor:  Normal  Speech/Language:   Normal Rate  Affect:  Congruent  Mood:  normal  Thought process:  normal  Thought content:    WNL  Sensory/Perceptual disturbances:    WNL  Orientation:  oriented to person, place, and time/date  Attention:  Good  Concentration:  Good  Memory:  WNL  Fund of knowledge:   Good  Insight:    Good  Judgment:   Good  Impulse Control:  Good    Reported Symptoms:  ***  Risk Assessment: Danger to Self:  No Self-injurious Behavior: No Danger to Others: No Duty to Warn:no Physical Aggression / Violence:No  Access to Firearms a concern: No  Gang Involvement:No  Patient / guardian was educated about steps to take if suicide or homicide risk level increases between visits: yes While future psychiatric events cannot be accurately predicted, the patient does not currently require acute inpatient psychiatric care and does not currently meet [facility] involuntary commitment criteria.  Substance Abuse History: Current substance abuse: {PSY:21197}    Past Psychiatric History:   {Past psych history:20559} Outpatient Providers:*** History of Psych Hospitalization: {PSY:21197} Psychological Testing: {PSY:21014032}   Abuse History:  Victim of: {Abuse History:314532}, {Type of abuse:20566}   Report needed: {PSY:314532} Victim of Neglect:{yes no:314532} Perpetrator of {PSY:20566}  Witness / Exposure to Domestic Violence: {PSY:21197}  Protective Services Involvement: {PSY:21197} Witness to [employer] Violence:  {PSY:21197}  Family History:  Family History  Problem Relation Age of  Onset   Ovarian cancer Mother    Heart disease Mother    Hypertension Mother    Coronary artery disease Mother    Colon polyps Mother 68   Heart failure Father    Hypertension Father    Hyperlipidemia Father    Diabetes Father    Breast cancer Maternal Grandmother    Colon cancer Neg Hx    Esophageal cancer Neg Hx    Stomach cancer Neg Hx    Rectal cancer Neg Hx     Living situation: the patient {lives:315711::"lives with their family"}  Sexual Orientation: {Sexual Orientation:[phone]}  Relationship Status: {Desc; marital status:62}  Name of spouse / other:*** If a parent, number of children / ages:***  Support Systems: {DIABETES SUPPORT:20310}  Financial Stress:  {YES/NO:21197}  Income/Employment/Disability: Geneticist, molecular: [employer]  Educational History: Education: {PSY :31912}  Religion/Sprituality/World View: {CHL AMB RELIGION/SPIRITUALITY:[phone]}  Any cultural differences that may affect / interfere with treatment:  {Religious/Cultural:200019}  Recreation/Hobbies: {Woc hobbies:30428}  Stressors: {PATIENT STRESSORS:22669}  Strengths: {Patient Coping [phone]}  Barriers:  ***   Legal History: Pending legal issue / charges: {PSY:20588} History of legal issue / charges: {Legal Issues:[phone]}  Medical History/Surgical History: {Desc; reviewed/not reviewed:60074} Past Medical History:  Diagnosis Date   Acute upper respiratory infections of unspecified site    ADD (attention deficit disorder)    Allergic rhinitis due to pollen    Anal fissure    Anemia, unspecified    Anxiety disorder    Asthma    Chicken pox    Complication of anesthesia    wakes up slow   Depressive disorder, not elsewhere classified  Diffuse cystic mastopathy    Falls    Fibromyalgia    GERD (gastroesophageal reflux disease)    Headache(784.0)    History of anorexia nervosa    History of bulimia    IBS (irritable  bowel syndrome)    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    Lump or mass in breast    Meningitis, unspecified(322.9)    Oral thrush    Other specified glaucoma    Perforation of tympanic membrane, unspecified    Radial styloid tenosynovitis    Seizures ([place])     Past Surgical History:  Procedure Laterality Date   ADENOIDECTOMY     ANKLE SURGERY  1992   right ankle - scar tissue   DILATION AND CURETTAGE OF UTERUS  2006   History of Menometrorrhagia   INNER EAR SURGERY  1966 to 1986   6 surgeries on right ear   LAPAROSCOPY  1993   normal   REDUCTION MAMMAPLASTY Bilateral 2018   uterine ablation  2011   WRIST SURGERY  2001   for de-quervaine - bilateral wrists - tendonitis    Medications: Current Outpatient Medications  Medication Sig Dispense Refill   ALPRAZolam (XANAX) 0.5 MG tablet Take 0.5 mg by mouth at bedtime as needed. For sleep.  Also 1/2 tab during the day.     amphetamine-dextroamphetamine (ADDERALL XR) 30 MG 24 hr capsule Take 30 mg by mouth every morning.     amphetamine-dextroamphetamine (ADDERALL) 30 MG tablet Take 30 mg by mouth. In the evening     brexpiprazole (REXULTI) 1 MG TABS tablet Take 1 tablet by mouth. Every 5 days     Cetirizine HCl 10 MG CAPS Take 1 capsule (10 mg total) by mouth daily for 10 days. 10 capsule 0   cyclobenzaprine (FLEXERIL) 10 MG tablet TAKE ONE TABLET BY MOUTH EVERY NIGHT AT BEDTIME AS NEEDED 30 tablet 2   dicyclomine (BENTYL) 10 MG capsule TAKE 1 CAPSULE BY MOUTH EVERY 8 HOURS AS NEEDED FOR SPASMS 60 capsule 2   DULoxetine (CYMBALTA) 60 MG capsule Take by mouth 2 (two) times daily.      esomeprazole (NEXIUM) 40 MG capsule TAKE 1 CAPSULE BY MOUTH TWO TIMES A DAY BEFORE MEALS 180 capsule 1   fluticasone (FLONASE) 50 MCG/ACT nasal spray PLACE 1 SPRAY INTO BOTH NOSTRILS DAILY AS NEEDED FOR ALLERGIES OR RHINITIS 16 mL 2   ibuprofen (ADVIL) 800 MG tablet TAKE ONE TABLET BY MOUTH EVERY 12 HOURS AS NEEDED 30 tablet 2   lubiprostone (AMITIZA) 8 MCG  capsule TAKE 1 CAPSULE (8 MCG TOTAL) BY MOUTH 2 (TWO) TIMES DAILY WITH A MEAL. 60 capsule 3   polyethylene glycol powder (GLYCOLAX/MIRALAX) 17 GM/SCOOP powder Take 17 g by mouth daily.     sucralfate (CARAFATE) 1 g tablet Take 1 tablet (1 g total) by mouth every 6 (six) hours as needed. Slowly dissolve 1 tablet in 1 Tablespoon of distilled water prior to taking 60 tablet 3   traZODone (DESYREL) 50 MG tablet Take 100 mg by mouth at bedtime.     Current Facility-Administered Medications  Medication Dose Route Frequency Provider Last Rate Last Admin   betamethasone acetate-betamethasone sodium phosphate (CELESTONE) injection 12 mg  12 mg Other Once Magnus Sinning, MD        Allergies  Allergen Reactions   Prednisone Rash    Face redness and swollen per patient   Pregabalin Other (See Comments)    Delusions   Remeron [Mirtazapine] Other (See Comments)  Severe hallucinations    Diagnoses:  No diagnosis found.  Plan of Care: ***   Tommi Crepeau, LCSW    This encounter was created in error - please disregard.

## 2021-02-01 ENCOUNTER — Encounter: Payer: Self-pay | Admitting: Orthopaedic Surgery

## 2021-02-01 ENCOUNTER — Ambulatory Visit: Payer: Medicare Other | Admitting: Orthopaedic Surgery

## 2021-02-01 ENCOUNTER — Other Ambulatory Visit: Payer: Self-pay

## 2021-02-01 DIAGNOSIS — G8929 Other chronic pain: Secondary | ICD-10-CM | POA: Diagnosis not present

## 2021-02-01 DIAGNOSIS — M545 Low back pain, unspecified: Secondary | ICD-10-CM

## 2021-02-01 DIAGNOSIS — M25552 Pain in left hip: Secondary | ICD-10-CM | POA: Diagnosis not present

## 2021-02-01 NOTE — Progress Notes (Signed)
Office Visit Note   Patient: Jordan [name]           Date of Birth: February 20, 1961           MRN: [id number] Visit Date: 02/01/2021              Requested by: Isaac Bliss, Rayford Halsted, MD [place],  [place] [address] PCP: Isaac Bliss, Rayford Halsted, MD   Assessment & Plan: Visit Diagnoses: No diagnosis found.  Plan: Dexter is a pleasant 60 year old woman who has now has an almost 2-year history of left lower back posterior buttock and groin pain after an incident involving removing a kayak from her car.  She has done physical therapy she takes 800 mg of ibuprofen twice a day and has not gotten any significant relief.  She did have MRIs of her lower back and her hip.  The hip MRI demonstrated an anterior superior labral tear and paralabral cyst.  The lower back MRI did not demonstrate any high-grade spinal canal or neural neural foraminal stenosis or nerve root impingement for the most part.  There was some degenerative changes.  She has had a epidural steroid injections both it [facility] and with Dr. Ernestina Patches.  She reports that neither of those were even diagnostically helpful.  She did have a hip joint injection anteriorly with Dr. Ernestina Patches that she did get some relief for a few weeks.  She feels uncomfortable sitting on her left posterior buttock.  Certainly difficult given her symptoms and her exams to define how much of this is coming from her back and how much from her hip.  She had a long conversation today with Dr. Durward Fortes and we have recommended referral to Dr. Aretha Parrot at [facility] for his opinion regarding her left hip and Dr. Inda Merlin for her lower back.  She is in agreement with this plan She said her pain is intermittent and can be sharp.  It radiates from her posterior buttock into her groin.  Yesterday it was a 10 out of 10 and caused her to miss work  Follow-Up Instructions: No follow-ups on file.   Orders:  No orders of the defined types were placed in  this encounter.  No orders of the defined types were placed in this encounter.     Procedures: No procedures performed   Clinical Data: No additional findings.   Subjective: Chief Complaint  Patient presents with   Lower Back - Pain   Left Hip - Pain  Patient presents today for recurrent chronic left hip and lower back pain. She has seen Dr.Newton for her left hip and lower back injections. Patient states that the lower back injection did not help her, but the hip injection helped for a two weeks. She said that the pain is worse than it use to be. She is walking with a different gait due to pain. She takes ibuprofen.     Review of Systems  All other systems reviewed and are negative.   Objective: Vital Signs: There were no vitals taken for this visit.  Physical Exam Patient is alert and awake she is shifting her weight side to side to move around quite a bit but is alert and appropriate to exam Ortho Exam Examination she focuses pain over the posterior buttock she says she believes this is from the hip and has separate lower back pain in her lower spine.  With applying pressure and internally rotating and externally rotating her hip at 90 degrees  she has mild pain.  Hip range of motion is actually quite fluid.  She does not have any radicular symptoms radiating down her left leg past her groin. Specialty Comments:  No specialty comments available.  Imaging: No results found.   PMFS History: Patient Active Problem List   Diagnosis Date Noted   Low back pain 10/19/2020   Severe recurrent major depression without psychotic features ([place]) 09/27/2015    Class: Chronic   Fibromyalgia 04/03/2013   Back pain, lumbosacral 04/03/2013   Degeneration of cervical intervertebral disc 04/03/2013   Dyspepsia and disorder of function of stomach 03/16/2012   Routine health maintenance 01/03/2012   Mild cognitive impairment with memory loss 01/03/2012   Cough 07/03/2011   Bilateral  dry eyes 05/13/2011   Back pain, thoracic 06/15/2010   MULTIPLE SCLEROSIS, PROGRESSIVE/RELAPSING 06/02/2009   Incontinence without sensory awareness 06/01/2009   NASAL POLYP 05/14/2009   Panic disorder without agoraphobia 04/25/2007   Carpal tunnel syndrome 04/25/2007   NECK PAIN, CHRONIC 02/19/2007   HAY FEVER 02/18/2007   ANEMIA-NOS 12/05/2006   Depression, recurrent ([place]) 12/05/2006   GLAUCOMA NEC 12/05/2006   TYMPANIC MEMBRANE PERFORATION 12/05/2006   FIBROCYSTIC BREAST DISEASE 12/05/2006   DE QUERVAIN'S TENOSYNOVITIS 12/05/2006   Headache 12/05/2006   BULIMIA, HX OF 12/05/2006   ANOREXIA, HX OF 12/05/2006   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    Lump or mass in breast    Meningitis, unspecified(322.9)    Oral thrush    Other specified glaucoma    Perforation of tympanic membrane, unspecified    Radial styloid tenosynovitis    Seizures ([place])     Family History  Problem Relation Age of Onset   Ovarian cancer Mother    Heart disease Mother    Hypertension Mother    Coronary artery disease Mother    Colon polyps Mother 47   Heart failure Father    Hypertension Father    Hyperlipidemia Father    Diabetes Father    Breast cancer Maternal Grandmother    Colon cancer Neg Hx    Esophageal cancer Neg Hx    Stomach cancer Neg Hx    Rectal cancer Neg Hx     Past Surgical History:  Procedure Laterality Date   ADENOIDECTOMY      ANKLE SURGERY  1992   right ankle - scar tissue   DILATION AND CURETTAGE OF UTERUS  2006   History of Menometrorrhagia   INNER EAR SURGERY  1966 to 1986   6 surgeries on right ear   LAPAROSCOPY  1993   normal   REDUCTION MAMMAPLASTY Bilateral 2018   uterine ablation  2011   WRIST SURGERY  2001   for de-quervaine - bilateral wrists - tendonitis   Social History   Occupational History   Occupation:  part time Designer, industrial/product  Tobacco Use   Smoking status: Never   Smokeless tobacco: Never  Vaping Use   Vaping Use: Never used  Substance and Sexual Activity   Alcohol use: Yes    Alcohol/week: 7.0 standard drinks    Types: 7 Glasses of wine per week   Drug use: No   Sexual activity: Not on file

## 2021-02-04 ENCOUNTER — Encounter: Payer: Self-pay | Admitting: Orthopaedic Surgery

## 2021-02-08 ENCOUNTER — Encounter: Payer: Self-pay | Admitting: Orthopaedic Surgery

## 2021-02-10 ENCOUNTER — Encounter: Payer: Self-pay | Admitting: Gastroenterology

## 2021-02-11 MED ORDER — SUCRALFATE 1 G PO TABS: 1.0000 g | ORAL_TABLET | Freq: Four times a day (QID) | ORAL | 1 refills | Status: DC | PRN

## 2021-02-18 ENCOUNTER — Encounter: Payer: Medicare Other | Admitting: Internal Medicine

## 2021-02-22 ENCOUNTER — Telehealth: Payer: Self-pay | Admitting: Internal Medicine

## 2021-02-22 NOTE — Telephone Encounter (Signed)
Left message asking pt to call [phone]  Returned patients call  Patient left message wanted to schedule cpe with dr Jerilee Hoh

## 2021-02-23 ENCOUNTER — Ambulatory Visit: Payer: Medicare Other | Admitting: Orthopaedic Surgery

## 2021-02-27 ENCOUNTER — Encounter: Payer: Self-pay | Admitting: Orthopaedic Surgery

## 2021-02-27 ENCOUNTER — Other Ambulatory Visit: Payer: Self-pay | Admitting: Internal Medicine

## 2021-02-27 DIAGNOSIS — M545 Low back pain, unspecified: Secondary | ICD-10-CM

## 2021-02-28 ENCOUNTER — Other Ambulatory Visit: Payer: Self-pay | Admitting: Orthopaedic Surgery

## 2021-03-08 ENCOUNTER — Encounter: Payer: Self-pay | Admitting: Orthopaedic Surgery

## 2021-03-08 ENCOUNTER — Ambulatory Visit: Payer: Medicare Other | Admitting: Orthopaedic Surgery

## 2021-03-08 DIAGNOSIS — M25552 Pain in left hip: Secondary | ICD-10-CM | POA: Diagnosis not present

## 2021-03-10 ENCOUNTER — Other Ambulatory Visit: Payer: Self-pay | Admitting: Orthopaedic Surgery

## 2021-03-10 MED ORDER — HYDROCODONE-ACETAMINOPHEN 5-325 MG PO TABS: 1.0000 | ORAL_TABLET | Freq: Four times a day (QID) | ORAL | 0 refills | Status: DC | PRN

## 2021-03-11 ENCOUNTER — Encounter: Payer: Self-pay | Admitting: Orthopaedic Surgery

## 2021-03-14 ENCOUNTER — Encounter: Payer: Self-pay | Admitting: Orthopaedic Surgery

## 2021-03-17 ENCOUNTER — Other Ambulatory Visit: Payer: Self-pay

## 2021-03-17 ENCOUNTER — Encounter: Payer: Self-pay | Admitting: Orthopaedic Surgery

## 2021-03-17 DIAGNOSIS — G8929 Other chronic pain: Secondary | ICD-10-CM

## 2021-03-17 DIAGNOSIS — M545 Low back pain, unspecified: Secondary | ICD-10-CM

## 2021-03-17 DIAGNOSIS — M25552 Pain in left hip: Secondary | ICD-10-CM

## 2021-03-18 ENCOUNTER — Ambulatory Visit: Payer: Medicare Other | Admitting: Psychology

## 2021-03-21 ENCOUNTER — Other Ambulatory Visit: Payer: Self-pay

## 2021-03-21 ENCOUNTER — Ambulatory Visit (INDEPENDENT_AMBULATORY_CARE_PROVIDER_SITE_OTHER): Payer: Medicare Other | Admitting: Internal Medicine

## 2021-03-21 ENCOUNTER — Encounter: Payer: Self-pay | Admitting: Internal Medicine

## 2021-03-21 VITALS — BP 120/70 | HR 83 | Temp 98.3°F | Ht 62.0 in | Wt 148.0 lb

## 2021-03-21 DIAGNOSIS — Z114 Encounter for screening for human immunodeficiency virus [HIV]: Secondary | ICD-10-CM

## 2021-03-21 DIAGNOSIS — Z1159 Encounter for screening for other viral diseases: Secondary | ICD-10-CM

## 2021-03-21 DIAGNOSIS — F339 Major depressive disorder, recurrent, unspecified: Secondary | ICD-10-CM

## 2021-03-21 DIAGNOSIS — Z0000 Encounter for general adult medical examination without abnormal findings: Secondary | ICD-10-CM | POA: Diagnosis not present

## 2021-03-21 DIAGNOSIS — M797 Fibromyalgia: Secondary | ICD-10-CM

## 2021-03-21 LAB — CBC WITH DIFFERENTIAL/PLATELET
Basophils Absolute: 0.1 10*3/uL (ref 0.0–0.1)
Basophils Relative: 0.8 % (ref 0.0–3.0)
Eosinophils Absolute: 0.3 10*3/uL (ref 0.0–0.7)
Eosinophils Relative: 3.8 % (ref 0.0–5.0)
HCT: 34.9 % — ABNORMAL LOW (ref 36.0–46.0)
Hemoglobin: 11.1 g/dL — ABNORMAL LOW (ref 12.0–15.0)
Lymphocytes Relative: 28 % (ref 12.0–46.0)
Lymphs Abs: 1.9 10*3/uL (ref 0.7–4.0)
MCHC: 31.8 g/dL (ref 30.0–36.0)
MCV: 78.6 fl (ref 78.0–100.0)
Monocytes Absolute: 0.5 10*3/uL (ref 0.1–1.0)
Monocytes Relative: 7.3 % (ref 3.0–12.0)
Neutro Abs: 4 10*3/uL (ref 1.4–7.7)
Neutrophils Relative %: 60.1 % (ref 43.0–77.0)
Platelets: 213 10*3/uL (ref 150.0–400.0)
RBC: 4.44 Mil/uL (ref 3.87–5.11)
RDW: 16.8 % — ABNORMAL HIGH (ref 11.5–15.5)
WBC: 6.6 10*3/uL (ref 4.0–10.5)

## 2021-03-21 LAB — COMPREHENSIVE METABOLIC PANEL
ALT: 20 U/L (ref 0–35)
AST: 20 U/L (ref 0–37)
Albumin: 4.4 g/dL (ref 3.5–5.2)
Alkaline Phosphatase: 77 U/L (ref 39–117)
BUN: 18 mg/dL (ref 6–23)
CO2: 25 mEq/L (ref 19–32)
Calcium: 9.5 mg/dL (ref 8.4–10.5)
Chloride: 107 mEq/L (ref 96–112)
Creatinine, Ser: 1.06 mg/dL (ref 0.40–1.20)
GFR: 57.04 mL/min — ABNORMAL LOW (ref >60.00)
Glucose, Bld: 93 mg/dL (ref 70–99)
Potassium: 4.3 mEq/L (ref 3.5–5.1)
Sodium: 143 mEq/L (ref 135–145)
Total Bilirubin: 0.4 mg/dL (ref 0.2–1.2)
Total Protein: 6.8 g/dL (ref 6.0–8.3)

## 2021-03-21 LAB — LIPID PANEL
Cholesterol: 193 mg/dL (ref 0–200)
HDL: 48.2 mg/dL (ref >39.00)
NonHDL: 145.03
Total CHOL/HDL Ratio: 4
Triglycerides: 202 mg/dL — ABNORMAL HIGH (ref 0.0–149.0)
VLDL: 40.4 mg/dL — ABNORMAL HIGH (ref 0.0–40.0)

## 2021-03-21 LAB — LDL CHOLESTEROL, DIRECT: Direct LDL: 119 mg/dL

## 2021-03-21 LAB — VITAMIN B12: Vitamin B-12: 803 pg/mL (ref 211–911)

## 2021-03-21 LAB — VITAMIN D 25 HYDROXY (VIT D DEFICIENCY, FRACTURES): VITD: 30.66 ng/mL (ref 30.00–100.00)

## 2021-03-21 LAB — HEMOGLOBIN A1C: Hgb A1c MFr Bld: 6.1 % (ref 4.6–6.5)

## 2021-03-21 NOTE — Progress Notes (Signed)
Established Patient Office Visit     This visit occurred during the SARS-CoV-2 public health emergency.  Safety protocols were in place, including screening questions prior to the visit, additional usage of staff PPE, and extensive cleaning of exam room while observing appropriate contact time as indicated for disinfecting solutions.    CC/Reason for Visit: Annual preventive exam and subsequent Medicare wellness visit  HPI: Jordan [name] is a 61 y.o. female who is coming in today for the above mentioned reasons. Past Medical History is significant for: Depression/anxiety, ADHD managed by psychiatry and fibromyalgia.  She tells me that she was diagnosed with a left hip labral tear and will be undergoing a hip replacement probably in February.  She has routine eye and dental care, she has chronically decreased hearing of her right ear without changes in the past 24 months.  She is overdue for COVID booster and shingles vaccine.  All cancer screening is up-to-date.  She follows with GYN, Dr. Philis Pique.   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    Lump or mass in breast    Meningitis, unspecified(322.9)    Oral thrush    Other specified glaucoma    Perforation of tympanic membrane, unspecified    Radial styloid tenosynovitis    Seizures ([place])     Past Surgical History:  Procedure Laterality Date    ADENOIDECTOMY     ANKLE SURGERY  1992   right ankle - scar tissue   DILATION AND CURETTAGE OF UTERUS  2006   History of Menometrorrhagia   INNER EAR SURGERY  1966 to 1986   6 surgeries on right ear   LAPAROSCOPY  1993   normal   REDUCTION MAMMAPLASTY Bilateral 2018   uterine ablation  2011   WRIST SURGERY  2001   for de-quervaine - bilateral wrists - tendonitis    Social History:  reports that she has never smoked. She has never used smokeless tobacco. She reports current alcohol use of about 7.0 standard drinks per week. She reports that she does not use drugs.  Allergies: Allergies  Allergen Reactions   Prednisone Rash    Face redness and swollen per patient   Pregabalin Other (See Comments)    Delusions   Remeron [Mirtazapine] Other (See Comments)    Severe hallucinations    Family History:  Family History  Problem Relation Age of Onset   Ovarian cancer Mother    Heart disease Mother    Hypertension Mother    Coronary artery disease Mother    Colon polyps Mother 45   Heart failure Father    Hypertension Father    Hyperlipidemia Father    Diabetes Father    Breast cancer Maternal Grandmother    Colon cancer Neg Hx  Esophageal cancer Neg Hx    Stomach cancer Neg Hx    Rectal cancer Neg Hx      Current Outpatient Medications:    ALPRAZolam (XANAX) 0.5 MG tablet, Take 0.5 mg by mouth at bedtime as needed. For sleep.  Also 1/2 tab during the day., Disp: , Rfl:    amphetamine-dextroamphetamine (ADDERALL XR) 30 MG 24 hr capsule, Take 30 mg by mouth every morning. Pt reported 20 mg, Disp: , Rfl:    amphetamine-dextroamphetamine (ADDERALL) 30 MG tablet, Take 30 mg by mouth. In the evening  Pt reported 20 mg, Disp: , Rfl:    brexpiprazole (REXULTI) 1 MG TABS tablet, Take 1 tablet by mouth. Every 5 days, Disp: , Rfl:    Cetirizine HCl 10 MG CAPS, Take 1 capsule (10 mg total) by mouth daily for 10 days., Disp: 10 capsule, Rfl: 0   cyclobenzaprine (FLEXERIL) 10 MG  tablet, Take 1 tablet (10 mg total) by mouth at bedtime as needed for muscle spasms., Disp: 30 tablet, Rfl: 2   dicyclomine (BENTYL) 10 MG capsule, TAKE 1 CAPSULE BY MOUTH EVERY 8 HOURS AS NEEDED FOR SPASMS, Disp: 60 capsule, Rfl: 2   DULoxetine (CYMBALTA) 60 MG capsule, Take by mouth 2 (two) times daily. , Disp: , Rfl:    esomeprazole (NEXIUM) 40 MG capsule, TAKE 1 CAPSULE BY MOUTH TWO TIMES A DAY BEFORE MEALS, Disp: 180 capsule, Rfl: 1   fluticasone (FLONASE) 50 MCG/ACT nasal spray, PLACE 1 SPRAY INTO BOTH NOSTRILS DAILY AS NEEDED FOR ALLERGIES OR RHINITIS, Disp: 16 mL, Rfl: 2   ibuprofen (ADVIL) 800 MG tablet, TAKE ONE TABLET BY MOUTH EVERY 12 HOURS AS NEEDED, Disp: 30 tablet, Rfl: 2   lubiprostone (AMITIZA) 8 MCG capsule, TAKE 1 CAPSULE (8 MCG TOTAL) BY MOUTH 2 (TWO) TIMES DAILY WITH A MEAL., Disp: 60 capsule, Rfl: 3   polyethylene glycol powder (GLYCOLAX/MIRALAX) 17 GM/SCOOP powder, Take 17 g by mouth daily., Disp: , Rfl:    sucralfate (CARAFATE) 1 g tablet, Take 1 tablet (1 g total) by mouth every 6 (six) hours as needed. Slowly dissolve 1 tablet in 1 Tablespoon of distilled water prior to taking, Disp: 60 tablet, Rfl: 1   traZODone (DESYREL) 50 MG tablet, Take 100 mg by mouth at bedtime., Disp: , Rfl:    amphetamine-dextroamphetamine (ADDERALL XR) 15 MG 24 hr capsule, Take by mouth., Disp: , Rfl:    amphetamine-dextroamphetamine (ADDERALL) 20 MG tablet, Take 20 mg by mouth daily., Disp: , Rfl:    HYDROcodone-acetaminophen (NORCO/VICODIN) 5-325 MG tablet, Take 1 tablet by mouth every 6 (six) hours as needed for moderate pain. (Patient not taking: Reported on 03/21/2021), Disp: 30 tablet, Rfl: 0   valACYclovir (VALTREX) 500 MG tablet, Take 500 mg by mouth 2 (two) times daily., Disp: , Rfl:   Current Facility-Administered Medications:    betamethasone acetate-betamethasone sodium phosphate (CELESTONE) injection 12 mg, 12 mg, Other, Once, Magnus Sinning, MD  Review of Systems:  Constitutional:  Denies fever, chills, diaphoresis, appetite change and fatigue.  HEENT: Denies photophobia, eye pain, redness, hearing loss, ear pain, congestion, sore throat, rhinorrhea, sneezing, mouth sores, trouble swallowing, neck pain, neck stiffness and tinnitus.   Respiratory: Denies SOB, DOE, cough, chest tightness,  and wheezing.   Cardiovascular: Denies chest pain, palpitations and leg swelling.  Gastrointestinal: Denies nausea, vomiting, abdominal pain, diarrhea, constipation, blood in stool and abdominal distention.  Genitourinary: Denies dysuria, urgency, frequency, hematuria, flank pain and difficulty urinating.  Endocrine: Denies: hot or cold intolerance, sweats,  changes in hair or nails, polyuria, polydipsia. Musculoskeletal: Denies myalgias, back pain, joint swelling, arthralgias and gait problem.  Skin: Denies pallor, rash and wound.  Neurological: Denies dizziness, seizures, syncope, weakness, light-headedness, numbness and headaches.  Hematological: Denies adenopathy. Easy bruising, personal or family bleeding history  Psychiatric/Behavioral: Denies suicidal ideation, mood changes, confusion, nervousness, sleep disturbance and agitation    Physical Exam: Vitals:   03/21/21 1009  BP: 120/70  Pulse: 83  Temp: 98.3 F (36.8 C)  TempSrc: Oral  SpO2: 98%  Weight: 148 lb (67.1 kg)  Height: 5\' 2"  (1.575 m)    Body mass index is 27.07 kg/m.   Constitutional: NAD, calm, comfortable Eyes: PERRL, lids and conjunctivae normal, wears corrective lenses ENMT: Mucous membranes are moist. Posterior pharynx clear of any exudate or lesions. Normal dentition. Tympanic membrane is pearly white, no erythema or bulging. Neck: normal, supple, no masses, no thyromegaly Respiratory: clear to auscultation bilaterally, no wheezing, no crackles. Normal respiratory effort. No accessory muscle use.  Cardiovascular: Regular rate and rhythm, no murmurs / rubs / gallops. No extremity edema. 2+ pedal pulses.  No carotid bruits.  Abdomen: no tenderness, no masses palpated. No hepatosplenomegaly. Bowel sounds positive.  Musculoskeletal: no clubbing / cyanosis. No joint deformity upper and lower extremities. Good ROM, no contractures. Normal muscle tone.  Skin: no rashes, lesions, ulcers. No induration Neurologic: CN 2-12 grossly intact. Sensation intact, DTR normal. Strength 5/5 in all 4.  Psychiatric: Normal judgment and insight. Alert and oriented x 3. Normal mood.    Subsequent Medicare wellness visit   1. Risk factors, based on past  M,S,F -cardiovascular disease risk factors include age only.   2.  Physical activities: Remains physically active.   3.  Depression/mood: History of depression but mood is stable   4.  Hearing: Chronic hearing loss of right ear   5.  ADL's: Independent in all ADLs   6.  Fall risk: Moderate fall risk as she had 1 fall this year.   7.  Home safety: No problems identified   8.  Height weight, and visual acuity: height and weight as above, vision: 20/25 with each eye independently and together with correction.    9.  Counseling: Advise she update immunizations   10. Lab orders based on risk factors: Laboratory update will be reviewed   11. Referral : None today   12. Care plan: Follow-up with me in 6 to 12 months   13. Cognitive assessment: No cognitive impairment   14. Screening: Patient provided with a written and personalized 5-10 year screening schedule in the AVS. yes   15. Provider List Update: PCP, psychiatrist  16. Advance Directives: Full code  17. Opioids: Patient is not on any opioid prescriptions and has no risk factors for a substance use disorder.   [place] Office Visit from 03/21/2021 in [place] at [facility]  PHQ-9 Total Score 4       Fall Risk 05/09/2020 03/21/2021  Falls in the past year? - 1  Was there an injury with Fall? - 1  Fall Risk Category Calculator - 2  Fall Risk Category - Moderate  Patient Fall  Risk Level Low fall risk Low fall risk     Impression and Plan:  Encounter for preventive health examination -Recommend routine eye and dental care. -Immunizations: Overdue for shingles vaccine and COVID booster which I have advised her to obtain at her pharmacy. -Healthy lifestyle discussed in detail. -Labs to be updated today. -Colon cancer screening: 04/2019 -  Breast cancer screening: 06/2020 -Cervical cancer screening: 2021 by GYN -Lung cancer screening: Not applicable -Prostate cancer screening: Not applicable -DEXA: Not applicable  Fibromyalgia Depression, recurrent ([place])  -Mood is stable, followed by psychiatry.  Encounter for screening for HIV  - Plan: HIV antibody (with reflex), HIV antibody (with reflex)  Encounter for hepatitis C screening test for low risk patient  - Plan: Hep C Antibody, Hep C Antibody    Patient Instructions  -Nice seeing you today!!  -Lab work today; will notify you once results are available.  -Remember your COVID booster and shingles vaccines at your pharmacy.  -Schedule follow up in 1 year or sooner as needed.      Lelon Frohlich, MD [facility]

## 2021-03-21 NOTE — Patient Instructions (Signed)
-  Nice seeing you today!!  -Lab work today; will notify you once results are available.  -Remember your COVID booster and shingles vaccines at your pharmacy.  -Schedule follow up in 1 year or sooner as needed.

## 2021-03-22 ENCOUNTER — Encounter: Payer: Self-pay | Admitting: Internal Medicine

## 2021-03-22 DIAGNOSIS — R739 Hyperglycemia, unspecified: Secondary | ICD-10-CM

## 2021-03-22 LAB — HIV ANTIBODY (ROUTINE TESTING W REFLEX): HIV 1&2 Ab, 4th Generation: NONREACTIVE

## 2021-03-22 LAB — HEPATITIS C ANTIBODY
Hepatitis C Ab: NONREACTIVE
SIGNAL TO CUT-OFF: <0.02 (ref <1.00)

## 2021-03-23 ENCOUNTER — Encounter: Payer: Self-pay | Admitting: Internal Medicine

## 2021-03-24 ENCOUNTER — Other Ambulatory Visit: Payer: Self-pay | Admitting: Internal Medicine

## 2021-03-24 ENCOUNTER — Encounter: Payer: Self-pay | Admitting: Internal Medicine

## 2021-03-24 ENCOUNTER — Ambulatory Visit (INDEPENDENT_AMBULATORY_CARE_PROVIDER_SITE_OTHER): Payer: Medicare Other | Admitting: Psychology

## 2021-03-24 DIAGNOSIS — F331 Major depressive disorder, recurrent, moderate: Secondary | ICD-10-CM

## 2021-03-24 DIAGNOSIS — E785 Hyperlipidemia, unspecified: Secondary | ICD-10-CM | POA: Insufficient documentation

## 2021-03-24 DIAGNOSIS — R7302 Impaired glucose tolerance (oral): Secondary | ICD-10-CM

## 2021-03-24 DIAGNOSIS — E781 Pure hyperglyceridemia: Secondary | ICD-10-CM | POA: Insufficient documentation

## 2021-03-24 DIAGNOSIS — D509 Iron deficiency anemia, unspecified: Secondary | ICD-10-CM | POA: Insufficient documentation

## 2021-03-24 DIAGNOSIS — R739 Hyperglycemia, unspecified: Secondary | ICD-10-CM

## 2021-03-24 HISTORY — DX: Impaired glucose tolerance (oral): R73.02

## 2021-03-24 HISTORY — DX: Iron deficiency anemia, unspecified: D50.9
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
## 2021-03-25 ENCOUNTER — Encounter: Payer: Self-pay | Admitting: Orthopaedic Surgery

## 2021-03-28 NOTE — Addendum Note (Signed)
Addended by: Westley Hummer B on: 03/28/2021 07:45 AM   Modules accepted: Orders

## 2021-03-29 ENCOUNTER — Ambulatory Visit: Payer: Medicare Other | Admitting: Orthopaedic Surgery

## 2021-04-02 ENCOUNTER — Other Ambulatory Visit: Payer: Self-pay | Admitting: Internal Medicine

## 2021-04-04 ENCOUNTER — Other Ambulatory Visit: Payer: Self-pay | Admitting: Internal Medicine

## 2021-04-04 ENCOUNTER — Encounter: Payer: Self-pay | Admitting: Internal Medicine

## 2021-04-05 ENCOUNTER — Encounter: Payer: Self-pay | Admitting: Orthopaedic Surgery

## 2021-04-05 ENCOUNTER — Ambulatory Visit: Payer: Medicare Other | Admitting: Orthopaedic Surgery

## 2021-04-05 ENCOUNTER — Ambulatory Visit: Payer: Self-pay

## 2021-04-05 ENCOUNTER — Encounter: Payer: Self-pay | Admitting: Internal Medicine

## 2021-04-05 ENCOUNTER — Other Ambulatory Visit: Payer: Self-pay

## 2021-04-05 VITALS — Ht 63.0 in | Wt 148.6 lb

## 2021-04-05 DIAGNOSIS — M545 Low back pain, unspecified: Secondary | ICD-10-CM

## 2021-04-05 DIAGNOSIS — M25552 Pain in left hip: Secondary | ICD-10-CM | POA: Diagnosis not present

## 2021-04-05 DIAGNOSIS — R7303 Prediabetes: Secondary | ICD-10-CM

## 2021-04-05 DIAGNOSIS — G8929 Other chronic pain: Secondary | ICD-10-CM

## 2021-04-05 MED ORDER — MELOXICAM 15 MG PO TABS: 15.0000 mg | ORAL_TABLET | Freq: Every day | ORAL | 1 refills | Status: DC

## 2021-04-05 NOTE — Progress Notes (Signed)
The patient has been seen in our practice by Dr. Durward Fortes and Dr. Ernestina Patches.  She is sent to me to assess her left hip to consider hip replacement surgery.  Apparently she had fell off a ladder 2 years ago and has had back pain and hip pain since then.  I can see from the notes that in October she had some injections in her lumbar spine by Dr. Ernestina Patches.  In November she had an injection in her left hip joint.  She says none of those have really helped.  Today she points to the lower aspect of her lumbar spine to the left side as a portion of her pain.  She does get occasional groin pain.  I was able to review the MRI of her lumbar spine and an MRI of her left hip that was done in October 2021 in terms of the hip MRI.  That MRI showed just some mild arthritic changes and a minimal labral tear.  I was able to review the images myself.  I did obtain new standing plain films today.  On my exam her left hip moves smoothly and fluidly.  There is no blocks to rotation and there is no pain in the groin.  Her pain seems to be around the facet joints of the lower lumbar spine to the left side.  New standing plain films of the left hip today show well-maintained joint space on both hips with no significant arthritic findings at all with either hip.  I did review the MRI of her left hip in 2021 and the MRI of her lumbar spine.  From my standpoint I will try meloxicam as an anti-inflammatory she is has not taken any anti-inflammatories and I would like to send her to Dr. Ernestina Patches for facet joint injections to the left side at L4-L5 and likely L5-S1.  We will see if that will help with her symptoms but from my standpoint, I do not feel hip replacement is warranted.

## 2021-04-07 ENCOUNTER — Telehealth: Payer: Self-pay | Admitting: Physical Medicine and Rehabilitation

## 2021-04-07 NOTE — Telephone Encounter (Signed)
Patient called. Returning a call to [place] 

## 2021-04-08 ENCOUNTER — Encounter: Payer: Self-pay | Admitting: Internal Medicine

## 2021-04-08 ENCOUNTER — Encounter: Payer: Self-pay | Admitting: Orthopaedic Surgery

## 2021-04-18 ENCOUNTER — Encounter: Payer: Self-pay | Admitting: Physical Therapy

## 2021-04-18 ENCOUNTER — Other Ambulatory Visit: Payer: Self-pay

## 2021-04-18 ENCOUNTER — Ambulatory Visit: Payer: Medicare Other | Admitting: Physical Therapy

## 2021-04-18 DIAGNOSIS — M5442 Lumbago with sciatica, left side: Secondary | ICD-10-CM

## 2021-04-18 DIAGNOSIS — G8929 Other chronic pain: Secondary | ICD-10-CM

## 2021-04-18 DIAGNOSIS — M25552 Pain in left hip: Secondary | ICD-10-CM | POA: Diagnosis not present

## 2021-04-18 DIAGNOSIS — M6281 Muscle weakness (generalized): Secondary | ICD-10-CM | POA: Diagnosis not present

## 2021-04-18 NOTE — Therapy (Signed)
OUTPATIENT PHYSICAL THERAPY THORACOLUMBAR EVALUATION   Patient Name: Jordan [name] MRN: [id number] DOB:June 18, 1960, 61 y.o., female Today's Date: 04/18/2021   PT End of Session - 04/18/21 1507     Visit Number 1    Number of Visits 6    Date for PT Re-Evaluation 05/30/21    Authorization Type [employer] Medicare    Progress Note Due on Visit 10    PT Start Time 1434    PT Stop Time 1500    PT Time Calculation (min) 26 min    Activity Tolerance Patient tolerated treatment well    Behavior During Therapy WFL for tasks assessed/performed             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    Lump or mass in breast    Meningitis, unspecified(322.9)    Oral thrush    Other specified glaucoma    Perforation of tympanic membrane, unspecified    Radial styloid tenosynovitis    Seizures ([place])    Past Surgical History:  Procedure Laterality Date   ADENOIDECTOMY     ANKLE SURGERY  1992   right ankle - scar tissue   DILATION AND CURETTAGE OF UTERUS  2006   History of Menometrorrhagia   INNER EAR SURGERY  1966 to 1986   6 surgeries on right ear   LAPAROSCOPY  1993   normal   REDUCTION MAMMAPLASTY Bilateral 2018   uterine ablation  2011   WRIST SURGERY  2001   for de-quervaine - bilateral wrists - tendonitis   Patient Active Problem List   Diagnosis Date Noted   IGT (impaired glucose tolerance) 03/24/2021   Iron deficiency  anemia 03/24/2021   Hyperlipidemia 03/24/2021   Low back pain 10/19/2020   Severe recurrent major depression without psychotic features ([place]) 09/27/2015    Class: Chronic   Fibromyalgia 04/03/2013   Back pain, lumbosacral 04/03/2013   Degeneration of cervical intervertebral disc 04/03/2013   Dyspepsia and disorder of function of stomach 03/16/2012   Routine health maintenance 01/03/2012   Mild cognitive impairment with memory loss 01/03/2012   Cough 07/03/2011   Bilateral dry eyes 05/13/2011   Back pain, thoracic 06/15/2010   MULTIPLE SCLEROSIS, PROGRESSIVE/RELAPSING 06/02/2009   Incontinence without sensory awareness 06/01/2009   NASAL POLYP 05/14/2009   Panic disorder without agoraphobia 04/25/2007   Carpal tunnel syndrome 04/25/2007   NECK PAIN, CHRONIC 02/19/2007   HAY FEVER 02/18/2007   ANEMIA-NOS 12/05/2006   Depression, recurrent ([place]) 12/05/2006   GLAUCOMA NEC 12/05/2006   TYMPANIC MEMBRANE PERFORATION 12/05/2006   FIBROCYSTIC BREAST DISEASE 12/05/2006   DE QUERVAIN'S TENOSYNOVITIS 12/05/2006   Headache 12/05/2006   BULIMIA, HX OF 12/05/2006   ANOREXIA, HX OF 12/05/2006    PCP: Isaac Bliss, Rayford Halsted, MD  REFERRING PROVIDER: Mcarthur Rossetti*  REFERRING DIAG: M54.50,G89.29 (ICD-10-CM) - Chronic left-sided low back pain without sciatica M25.552 (ICD-10-CM) - Pain in left hip   THERAPY DIAG:  Pain in left hip - Plan: PT plan of care cert/re-cert  Muscle weakness (generalized) - Plan: PT plan of care cert/re-cert  Chronic left-sided low back pain with left-sided sciatica - Plan: PT plan of care cert/re-cert  ONSET DATE: 2 years  SUBJECTIVE:                                                                                                                                                                                           SUBJECTIVE STATEMENT: Pt reports chronic  Lt hip pain and low back pain.  She reports about 2 years ago she had a fall resulting  in distal Rt femur fx.  Since then she's has pain in Lt hip, and has tried injections to that hip.  Outside MD suggested a THA, but referring MD recommending PT and conservative tx.  PERTINENT HISTORY:  ADD, anxiety, asthma, depression, fibromyalgia, meningitis, seizures  PAIN:  Are you having pain? Yes NPRS scale: 5/10, at best 0/10, up to 10/10 Pain location: hip Pain orientation: Left  PAIN TYPE: sharp Pain description: intermittent  Aggravating factors: inactivity, sitting  Relieving factors: repositioning, movement  PRECAUTIONS: None  WEIGHT BEARING RESTRICTIONS No  FALLS:  Has patient fallen in last 6 months? 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), minimal lifting  PLOF: Independent and Leisure: kayaking, walking, shoots basketball, cares for elderly in neighborhood  PATIENT GOALS improve pain and strength   OBJECTIVE:   DIAGNOSTIC FINDINGS:  MRI: Lt hip labral tear  PATIENT SURVEYS:  04/18/21 FOTO 32 (predicted 26)  COGNITION:  Overall cognitive status: Within functional limits for tasks assessed     SENSATION:  Light touch: Appears intact    MUSCLE LENGTH: No significant tightness noted  POSTURE:  Rounded shoulders  PALPATION: Mild tenderness superior to greater trochanter on Lt, otherwise unremarkable  LUMBARAROM/PROM  A/PROM A/PROM  04/18/2021  Flexion WNL  Extension WNL with pain  Right lateral flexion WNL  Left lateral flexion WNL with pain on LT  Right rotation WNL  Left rotation WNL   (Blank rows = not tested)   LE MMT:  MMT Right 04/18/2021 Left 04/18/2021  Hip flexion 5/5 5/5  Hip extension 4/5 3+/5  Hip abduction 4/5 3+/5  Hip adduction 4/5 3+/5  Hip internal rotation 4/5 4/5  Hip external rotation 3+/5 3+/5  Knee flexion    Knee extension     Ankle dorsiflexion    Ankle plantarflexion    Ankle inversion    Ankle eversion     (Blank rows = not tested)  LUMBAR SPECIAL TESTS:  Deferred today   GAIT:   independent   TODAY'S TREATMENT  04/18/21:  See HEP, performed trial reps PRN and reviewed exercises with pt   PATIENT EDUCATION:  Education details: HEP Person educated: Patient Education method: Explanation, Demonstration, and Handouts Education comprehension: verbalized understanding, returned demonstration, and needs further education   HOME EXERCISE PROGRAM: Access Code: [email or web address] URL: [email or web address] Date: 04/18/2021 Prepared by: Faustino Congress  Exercises Supine Bridge - 2 x daily - 7 x weekly - 1 sets - 10 reps - 5 sec hold Marching Bridge - 1 x daily - 7 x weekly - 3 sets - 10 reps Ab Prep - 1 x daily - 7 x weekly - 3 sets - 10 reps Bilateral Bent Leg Lift - 1 x daily - 7 x weekly - 3 sets - 10 reps Standard Plank - 1 x daily - 7 x weekly - 3 sets - 10 reps Standard Lunge - 1 x daily - 7 x weekly - 3 sets - 10 reps Lateral Lunge - 1 x daily - 7 x weekly - 3 sets - 10 reps Squat - 1 x daily - 7 x weekly - 3 sets - 10 reps Kettlebell Deadlift - 1 x daily - 7 x weekly - 3 sets - 10 reps   ASSESSMENT:  CLINICAL IMPRESSION: Patient is a 61 y.o. female who was seen today for physical therapy evaluation and treatment for Lt hip pain and low back pain.    OBJECTIVE IMPAIRMENTS decreased mobility, decreased strength, increased fascial restrictions, increased muscle spasms, and pain.   ACTIVITY LIMITATIONS cleaning, community activity, driving, occupation, laundry, and yard work.   PERSONAL FACTORS Past/current experiences and 3+ comorbidities: ADD, anxiety, asthma, depression, fibromyalgia, meningitis, seizures  are also affecting patient's functional outcome.    REHAB POTENTIAL: Excellent  CLINICAL DECISION MAKING: Stable/uncomplicated  EVALUATION COMPLEXITY:  Low   GOALS: Goals reviewed with patient? Yes  SHORT TERM GOALS:  STG Name Target Date Goal status  1 Independent with initial HEP Baseline:  05/09/2021 INITIAL   LONG TERM GOALS:   LTG Name Target Date Goal status  1 Independent with final HEP Baseline: 05/30/2021 INITIAL  2 FOTO score improved to 48 for improved function Baseline: 05/30/2021 INITIAL  3 Lt hip strength improved to 4/5 for improved function Baseline: 05/30/2021 INITIAL  4 Report pain < 5/10 with activity and at highest for improved function  Baseline: 05/30/2021 INITIAL     PLAN: PT FREQUENCY: 1x/week  PT DURATION: 6 weeks  PLANNED INTERVENTIONS: Therapeutic exercises, Therapeutic activity, Neuro Muscular re-education, Patient/Family education, Joint mobilization, Stair training, Dry Needling, Electrical stimulation, Cryotherapy, Moist heat, Taping, Traction, and Manual therapy  PLAN FOR NEXT SESSION: review HEP, add gym exercises, continue hip strengthening     Laureen Abrahams, PT, DPT 04/18/21 3:10 PM

## 2021-04-19 ENCOUNTER — Encounter: Payer: Self-pay | Admitting: Orthopaedic Surgery

## 2021-04-19 ENCOUNTER — Ambulatory Visit: Payer: Medicare Other | Admitting: Orthopaedic Surgery

## 2021-04-19 VITALS — BP 138/82 | HR 101 | Ht 63.0 in | Wt 146.0 lb

## 2021-04-19 DIAGNOSIS — M545 Low back pain, unspecified: Secondary | ICD-10-CM

## 2021-04-19 NOTE — Progress Notes (Signed)
Office Visit Note   Patient: Jordan [name]           Date of Birth: 1960/08/16           MRN: [id number] Visit Date: 04/19/2021              Requested by: Garald Balding, [address],  [place] [id number] PCP: Isaac Bliss, Rayford Halsted, MD   Assessment & Plan: Visit Diagnoses:  1. Back pain, lumbosacral     Plan: Proceed with visit with Dr. Ernestina Patches for lumbar injection.  I reviewed the MRI with her no areas of significant compression.  I can recheck her in 3 months.  We reviewed lumbar MRI hip MRI.  We reviewed plain radiographs discussed pathophysiology.  Follow-Up Instructions: Return in about 3 months (around 07/17/2021).   Orders:  No orders of the defined types were placed in this encounter.  No orders of the defined types were placed in this encounter.     Procedures: No procedures performed   Clinical Data: No additional findings.   Subjective: Chief Complaint  Patient presents with   Lower Back - Pain    HPI 61 year old female with low back pain primarily in the buttocks lumbar/sacral junction.  She denies radicular symptoms down to her feet.  She had an MRI that showed some degenerative labral.  Some early arthritis.  She has had intra-articular hip injection.  She fell off a ladder 2 years ago when she was on steps for 5 steps up putting a kayak on a vehicle.  She is having therapy currently for back and is scheduled for an epidural.  Lumbar MRI scan showed some facet arthropathy at L4-5 some short pedicles from L2 down the S1 without definite stenosis.  No nerve root impingement.  Review of Systems cervical spondylitic problems to get better with stress reduction.  Possible left hip labral tear.  All other systems noncontributory to HPI.   Objective: Vital Signs: BP 138/82    Pulse (!) 101    Ht 5\' 3"  (1.6 m)    Wt 146 lb (66.2 kg)    BMI 25.86 kg/m   Physical Exam Constitutional:      Appearance: She is well-developed.   HENT:     Head: Normocephalic.     Right Ear: External ear normal.     Left Ear: External ear normal. There is no impacted cerumen.  Eyes:     Pupils: Pupils are equal, round, and reactive to light.  Neck:     Thyroid: No thyromegaly.     Trachea: No tracheal deviation.  Cardiovascular:     Rate and Rhythm: Normal rate.  Pulmonary:     Effort: Pulmonary effort is normal.  Abdominal:     Palpations: Abdomen is soft.  Musculoskeletal:     Cervical back: No rigidity.  Skin:    General: Skin is warm and dry.  Neurological:     Mental Status: She is alert and oriented to person, place, and time.  Psychiatric:        Behavior: Behavior normal.    Ortho Exam minimal discomfort with logroll of the hips.  No hip flexion contracture negative straight leg raising 90 degrees minimal trochanteric bursal tenderness.  Anterior tib gastrocsoleus is strong normal heel toe gait.  Specialty Comments:  No specialty comments available.  Imaging:CLINICAL DATA: Knee instability Knee trauma, tenderness or effusion, no prior imaging (Age >= 1y) Meniscal tear, untreated, new symptoms r/o ACL tear  EXAM: MRI OF THE LEFT KNEE WITHOUT CONTRAST  TECHNIQUE: Multiplanar, multisequence MR imaging of the knee was performed. No intravenous contrast was administered.  COMPARISON: X-ray 09/29/2020  FINDINGS: MENISCI  Medial meniscus: Vertical tear involving the far peripheral aspect of the medial meniscal posterior horn extending to the body segment (series 5, images 5-8).  Lateral meniscus: Vertical tear involving the peripheral aspect of the lateral meniscal posterior horn extending to the body segment (series 5, images 22-25).  LIGAMENTS  Cruciates: Complete midsubstance rupture of the ACL. Intact PCL.  Collaterals: Medial collateral ligament is intact. Lateral collateral ligament complex is intact.  CARTILAGE  Patellofemoral: No chondral defect.  Medial: No chondral  defect.  Lateral: No chondral defect.  Joint: Large knee joint effusion. Mild edema within Hoffa's fat.  Popliteal Fossa: No Baker cyst. Intact popliteus tendon.  Extensor Mechanism: Intact quadriceps tendon and patellar tendon.  Bones: Pivot-shift bone contusions involving the posterior aspect of the lateral tibial plateau and periphery of the lateral femoral condyle. No discrete fracture. No dislocation. No suspicious bone lesion.  Other: No organized fluid collection or hematoma.  IMPRESSION: 1. Complete ACL tear with associated pivot-shift bone contusions. 2. Vertical tears involving the peripheral aspects of the medial and lateral menisci. 3. Intact collateral ligaments. 4. Large knee joint effusion.   Electronically Signed By: Davina Poke D.O. On: 10/02/2020 11:11     PMFS History: Patient Active Problem List   Diagnosis Date Noted   IGT (impaired glucose tolerance) 03/24/2021   Iron deficiency anemia 03/24/2021   Hyperlipidemia 03/24/2021   Low back pain 10/19/2020   Severe recurrent major depression without psychotic features ([place]) 09/27/2015    Class: Chronic   Fibromyalgia 04/03/2013   Back pain, lumbosacral 04/03/2013   Degeneration of cervical intervertebral disc 04/03/2013   Dyspepsia and disorder of function of stomach 03/16/2012   Routine health maintenance 01/03/2012   Mild cognitive impairment with memory loss 01/03/2012   Cough 07/03/2011   Bilateral dry eyes 05/13/2011   Back pain, thoracic 06/15/2010   MULTIPLE SCLEROSIS, PROGRESSIVE/RELAPSING 06/02/2009   Incontinence without sensory awareness 06/01/2009   NASAL POLYP 05/14/2009   Panic disorder without agoraphobia 04/25/2007   Carpal tunnel syndrome 04/25/2007   NECK PAIN, CHRONIC 02/19/2007   HAY FEVER 02/18/2007   ANEMIA-NOS 12/05/2006   Depression, recurrent ([place]) 12/05/2006   GLAUCOMA NEC 12/05/2006   TYMPANIC MEMBRANE PERFORATION 12/05/2006   FIBROCYSTIC BREAST DISEASE  12/05/2006   DE QUERVAIN'S TENOSYNOVITIS 12/05/2006   Headache 12/05/2006   BULIMIA, HX OF 12/05/2006   ANOREXIA, HX OF 12/05/2006   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    Lump or mass in breast    Meningitis, unspecified(322.9)    Oral thrush    Other specified glaucoma    Perforation of tympanic membrane, unspecified  Radial styloid tenosynovitis    Seizures ([facility])     Family History  Problem Relation Age of Onset   Ovarian cancer Mother    Heart disease Mother    Hypertension Mother    Coronary artery disease Mother    Colon polyps Mother 71   Heart failure Father    Hypertension Father    Hyperlipidemia Father    Diabetes Father    Breast cancer Maternal Grandmother    Colon cancer Neg Hx    Esophageal cancer Neg Hx    Stomach cancer Neg Hx    Rectal cancer Neg Hx     Past Surgical History:  Procedure Laterality Date   ADENOIDECTOMY     ANKLE SURGERY  1992   right ankle - scar tissue   DILATION AND CURETTAGE OF UTERUS  2006   History of Menometrorrhagia   INNER EAR SURGERY  1966 to 1986   6 surgeries on right ear   LAPAROSCOPY  1993   normal   REDUCTION MAMMAPLASTY Bilateral 2018   uterine ablation  2011   WRIST SURGERY  2001   for de-quervaine - bilateral wrists - tendonitis   Social History   Occupational History   Occupation: part time Designer, industrial/product  Tobacco Use   Smoking status: Never   Smokeless tobacco:  Never  Vaping Use   Vaping Use: Never used  Substance and Sexual Activity   Alcohol use: Yes    Alcohol/week: 7.0 standard drinks    Types: 7 Glasses of wine per week   Drug use: No   Sexual activity: Not on file

## 2021-04-24 ENCOUNTER — Encounter: Payer: Self-pay | Admitting: Orthopaedic Surgery

## 2021-04-25 ENCOUNTER — Other Ambulatory Visit: Payer: Self-pay

## 2021-04-25 ENCOUNTER — Ambulatory Visit (INDEPENDENT_AMBULATORY_CARE_PROVIDER_SITE_OTHER): Payer: Medicare Other | Admitting: Physical Medicine and Rehabilitation

## 2021-04-25 ENCOUNTER — Encounter: Payer: Self-pay | Admitting: Physical Medicine and Rehabilitation

## 2021-04-25 ENCOUNTER — Ambulatory Visit: Payer: Self-pay

## 2021-04-25 VITALS — BP 112/76 | HR 103

## 2021-04-25 DIAGNOSIS — M47816 Spondylosis without myelopathy or radiculopathy, lumbar region: Secondary | ICD-10-CM | POA: Diagnosis not present

## 2021-04-25 DIAGNOSIS — M25552 Pain in left hip: Secondary | ICD-10-CM

## 2021-04-25 MED ORDER — METHYLPREDNISOLONE ACETATE 80 MG/ML IJ SUSP: 80.0000 mg | Freq: Once | INTRAMUSCULAR | Status: DC

## 2021-04-25 NOTE — Progress Notes (Signed)
Pt state lower back that travels her left hip pain. Pt state sitting and laying down makes the pain worse. Pt state she takes over the counter pain meds and excise to help ease her pain. ? ?Numeric Pain Rating Scale and Functional Assessment ?Average Pain 7 ? ? ?In the last MONTH (on 0-10 scale) has pain interfered with the following? ? ?1. General activity like being  able to carry out your everyday physical activities such as walking, climbing stairs, carrying groceries, or moving a chair?  ?Rating(10) ? ? ?+Driver, -BT, -Dye Allergies. ? ?

## 2021-04-25 NOTE — Patient Instructions (Signed)

## 2021-04-28 ENCOUNTER — Encounter: Payer: Medicare Other | Admitting: Physical Therapy

## 2021-04-28 NOTE — Therapy (Incomplete)
?OUTPATIENT PHYSICAL THERAPY TREATMENT NOTE ? ? ?Patient Name: Jordan [name] ?MRN: [id number] ?DOB:May 01, 1960, 61 y.o., female ?Today's Date: 04/28/2021 ? ?PCP: Isaac Bliss, Rayford Halsted, MD ?REFERRING PROVIDER: Mcarthur Rossetti* ? ? ? ?Past Medical History:  ?Diagnosis Date  ? Acute upper respiratory infections of unspecified site   ? ADD (attention deficit disorder)   ? Allergic rhinitis due to pollen   ? Anal fissure   ? Anemia, unspecified   ? Anxiety disorder   ? Asthma   ? Chicken pox   ? Complication of anesthesia   ? wakes up slow  ? Depressive disorder, not elsewhere classified   ? Diffuse cystic mastopathy   ? Falls   ? Fibromyalgia   ? GERD (gastroesophageal reflux disease)   ? Headache(784.0)   ? History of anorexia nervosa   ? History of bulimia   ? IBS (irritable bowel syndrome)   ? 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 Lump or mass in breast   ? Meningitis, unspecified(322.9)   ? Oral thrush   ? Other specified glaucoma   ? Perforation of tympanic membrane, unspecified   ? Radial styloid tenosynovitis   ? Seizures ([place])   ? ?Past Surgical History:  ?Procedure Laterality Date  ? ADENOIDECTOMY    ? ANKLE SURGERY  1992  ? right ankle - scar tissue  ? DILATION AND CURETTAGE OF UTERUS  2006  ? History of Menometrorrhagia  ? [place] to 1986  ? 6 surgeries on right ear  ? LAPAROSCOPY  1993  ? normal  ? REDUCTION MAMMAPLASTY Bilateral 2018  ? uterine ablation  2011  ? WRIST SURGERY  2001  ? for de-quervaine - bilateral wrists - tendonitis  ? ?Patient Active Problem List  ? Diagnosis Date Noted  ? IGT (impaired glucose tolerance) 03/24/2021  ? Iron deficiency anemia 03/24/2021  ? Hyperlipidemia 03/24/2021  ? Low back pain 10/19/2020  ? Severe recurrent major depression without psychotic features ([place]) 09/27/2015  ?  Class: Chronic  ? Fibromyalgia 04/03/2013  ? Back pain, lumbosacral 04/03/2013  ? Degeneration of cervical intervertebral disc 04/03/2013  ? Dyspepsia and disorder of  function of stomach 03/16/2012  ? Routine health maintenance 01/03/2012  ? Mild cognitive impairment with memory loss 01/03/2012  ? Cough 07/03/2011  ? Bilateral dry eyes 05/13/2011  ? Back pain, thoracic 06/15/2010  ? MULTIPLE SCLEROSIS, PROGRESSIVE/RELAPSING 06/02/2009  ? Incontinence without sensory awareness 06/01/2009  ? NASAL POLYP 05/14/2009  ? Panic disorder without agoraphobia 04/25/2007  ? Carpal tunnel syndrome 04/25/2007  ? NECK PAIN, CHRONIC 02/19/2007  ? HAY FEVER 02/18/2007  ? ANEMIA-NOS 12/05/2006  ? Depression, recurrent ([place]) 12/05/2006  ? GLAUCOMA NEC 12/05/2006  ? TYMPANIC MEMBRANE PERFORATION 12/05/2006  ? FIBROCYSTIC BREAST DISEASE 12/05/2006  ? DE QUERVAIN'S TENOSYNOVITIS 12/05/2006  ? Headache 12/05/2006  ? BULIMIA, HX OF 12/05/2006  ? ANOREXIA, HX OF 12/05/2006  ? ? ?REFERRING DIAG: M54.50,G89.29 (ICD-10-CM) - Chronic left-sided low back pain without sciatica M25.552 (ICD-10-CM) - Pain in left hip  ? ?THERAPY DIAG:  ?No diagnosis found. ? ?PERTINENT HISTORY: ADD, anxiety, asthma, depression, fibromyalgia, meningitis, seizures ? ?PRECAUTIONS: *** ? ?SUBJECTIVE: *** ? ?PAIN:  ?Are you having pain? {yes/no:20286} ?NPRS scale: ***/10 ?Pain location: *** ?Pain orientation: {Pain Orientation:25161}  ?PAIN TYPE: {type:313116} ?Pain description: {PAIN DESCRIPTION:21022940}  ?Aggravating factors: *** ?Relieving factors: *** ? ? ? ? ?OBJECTIVE:  ?  ?DIAGNOSTIC FINDINGS:  ?MRI: Lt hip labral tear ?  ?PATIENT SURVEYS:  ?04/18/21  FOTO 46 (predicted 48) ?  ?LUMBARAROM/PROM ?  ?A/PROM A/PROM  ?04/18/2021  ?Flexion WNL  ?Extension WNL with pain  ?Right lateral flexion WNL  ?Left lateral flexion WNL with pain on LT  ?Right rotation WNL  ?Left rotation WNL  ? (Blank rows = not tested) ?  ?  ?LE MMT: ?  ?MMT Right ?04/18/2021 Left ?04/18/2021  ?Hip flexion 5/5 5/5  ?Hip extension 4/5 3+/5  ?Hip abduction 4/5 3+/5  ?Hip adduction 4/5 3+/5  ?Hip internal rotation 4/5 4/5  ?Hip external rotation 3+/5 3+/5  ?Knee  flexion      ?Knee extension      ?Ankle dorsiflexion      ?Ankle plantarflexion      ?Ankle inversion      ?Ankle eversion      ? (Blank rows = not tested) ?  ?LUMBAR SPECIAL TESTS:  ?Deferred today ?  ?  ?GAIT: ?                    independent ?  ?  ?TODAY'S TREATMENT  ?04/28/21 ?*** ? ?04/18/21:  See HEP, performed trial reps PRN and reviewed exercises with pt ?  ?  ?PATIENT EDUCATION:  ?Education details: HEP ?Person educated: Patient ?Education method: Explanation, Demonstration, and Handouts ?Education comprehension: verbalized understanding, returned demonstration, and needs further education ?  ?  ?HOME EXERCISE PROGRAM: ?Access Code: [email or web address] ?URL: [email or web address] ?Date: 04/18/2021 ?Prepared by: Faustino Congress ?  ?Exercises ?Supine Bridge - 2 x daily - 7 x weekly - 1 sets - 10 reps - 5 sec hold ?Marching Bridge - 1 x daily - 7 x weekly - 3 sets - 10 reps ?Ab Prep - 1 x daily - 7 x weekly - 3 sets - 10 reps ?Bilateral Bent Leg Lift - 1 x daily - 7 x weekly - 3 sets - 10 reps ?Standard Plank - 1 x daily - 7 x weekly - 3 sets - 10 reps ?Standard Lunge - 1 x daily - 7 x weekly - 3 sets - 10 reps ?Lateral Lunge - 1 x daily - 7 x weekly - 3 sets - 10 reps ?Squat - 1 x daily - 7 x weekly - 3 sets - 10 reps ?Kettlebell Deadlift - 1 x daily - 7 x weekly - 3 sets - 10 reps ?  ?  ?ASSESSMENT: ?  ?CLINICAL IMPRESSION: ?*** ?Patient is a 61 y.o. female who was seen today for physical therapy evaluation and treatment for Lt hip pain and low back pain.  ?  ?  ?OBJECTIVE IMPAIRMENTS decreased mobility, decreased strength, increased fascial restrictions, increased muscle spasms, and pain.  ?  ?ACTIVITY LIMITATIONS cleaning, community activity, driving, occupation, laundry, and yard work.  ?  ?PERSONAL FACTORS Past/current experiences and 3+ comorbidities: ADD, anxiety, asthma, depression, fibromyalgia, meningitis, seizures  are also affecting patient's functional outcome.  ?  ?  ?REHAB POTENTIAL:  Excellent ?  ?CLINICAL DECISION MAKING: Stable/uncomplicated ?  ?EVALUATION COMPLEXITY: Low ?  ?  ?GOALS: ?Goals reviewed with patient? Yes ?  ?SHORT TERM GOALS: ?  ?STG Name Target Date Goal status  ?1 Independent with initial HEP ?Baseline:  05/09/2021 INITIAL  ?  ?LONG TERM GOALS:  ?  ?LTG Name Target Date Goal status  ?1 Independent with final HEP ?Baseline: 05/30/2021 INITIAL  ?2 FOTO score improved to 48 for improved function ?Baseline: 05/30/2021 INITIAL  ?3 Lt hip strength improved to 4/5 for improved function ?Baseline: 05/30/2021 INITIAL  ?4 Report pain <  5/10 with activity and at highest for improved function  ?Baseline: 05/30/2021 INITIAL  ?  ?  ?  ?PLAN: ?PT FREQUENCY: 1x/week ?  ?PT DURATION: 6 weeks ?  ?PLANNED INTERVENTIONS: Therapeutic exercises, Therapeutic activity, Neuro Muscular re-education, Patient/Family education, Joint mobilization, Stair training, Dry Needling, Electrical stimulation, Cryotherapy, Moist heat, Taping, Traction, and Manual therapy ?  ?PLAN FOR NEXT SESSION: *** review HEP, add gym exercises, continue hip strengthening ?  ? ? ? ?Faustino Congress, PT ?04/28/2021, 12:19 PM ? ?  ? ?

## 2021-04-29 ENCOUNTER — Other Ambulatory Visit: Payer: Self-pay | Admitting: Gastroenterology

## 2021-04-29 DIAGNOSIS — D3142 Benign neoplasm of left ciliary body: Secondary | ICD-10-CM | POA: Diagnosis not present

## 2021-04-29 DIAGNOSIS — H04123 Dry eye syndrome of bilateral lacrimal glands: Secondary | ICD-10-CM | POA: Diagnosis not present

## 2021-04-29 DIAGNOSIS — H40033 Anatomical narrow angle, bilateral: Secondary | ICD-10-CM | POA: Diagnosis not present

## 2021-04-29 DIAGNOSIS — H2513 Age-related nuclear cataract, bilateral: Secondary | ICD-10-CM | POA: Diagnosis not present

## 2021-04-30 ENCOUNTER — Encounter: Payer: Self-pay | Admitting: Internal Medicine

## 2021-05-02 ENCOUNTER — Ambulatory Visit (INDEPENDENT_AMBULATORY_CARE_PROVIDER_SITE_OTHER): Payer: Medicare Other | Admitting: Psychology

## 2021-05-02 DIAGNOSIS — F331 Major depressive disorder, recurrent, moderate: Secondary | ICD-10-CM | POA: Diagnosis not present

## 2021-05-02 NOTE — Progress Notes (Signed)
? ?Jordan [name] - 61 y.o. female MRN [id number]  Date of birth: [birth year] ? ?Office Visit Note: ?Visit Date: 04/25/2021 ?PCP: Isaac Bliss, Rayford Halsted, MD ?Referred by: Isaac Bliss, [place]* ? ?Subjective: ?Chief Complaint  ?Patient presents with  ? Lower Back - Pain  ? Left Hip - Pain  ? ?HPI:  Jordan [name] is a 61 y.o. female who comes in today at the request of Dr. Jean Rosenthal for planned Left  L4-5 and L5-S1 Lumbar facet/medial branch block with fluoroscopic guidance.  The patient has failed conservative care including home exercise, medications, time and activity modification.  This injection will be diagnostic and hopefully therapeutic.  Please see requesting physician notes for further details and justification.  Exam has shown concordant pain with facet joint loading. MRI reviewed with images and spine model.  MRI reviewed in the note below.  ? ?ROS Otherwise per HPI. ? ?Assessment & Plan: ?Visit Diagnoses:  ?  ICD-10-CM   ?1. Facet hypertrophy of lumbar region  M47.816 XR C-ARM NO REPORT  ?  Facet Injection  ?  methylPREDNISolone acetate (DEPO-MEDROL) injection 80 mg  ?  ?  ?Plan: No additional findings.  ? ?Meds & Orders:  ?Meds ordered this encounter  ?Medications  ? methylPREDNISolone acetate (DEPO-MEDROL) injection 80 mg  ?  ?Orders Placed This Encounter  ?Procedures  ? Facet Injection  ? XR C-ARM NO REPORT  ?  ?Follow-up: Return if symptoms worsen or fail to improve.  ? ?Procedures: ?No procedures performed  ?Lumbar Facet Joint Intra-Articular Injection(s) with Fluoroscopic Guidance ? ?Patient: Jordan [name]      ?Date of Birth: October 26, 1960 ?MRN: [id number] ?PCP: Isaac Bliss, Rayford Halsted, MD      ?Visit Date: 04/25/2021 ?  ?Universal Protocol:    ?Date/Time: 04/25/2021 ? ?Consent Given By: the patient ? ?Position: PRONE  ? ?Additional Comments: ?Vital signs were monitored before and after the procedure. ?Patient was prepped and draped in the  usual sterile fashion. ?The correct patient, procedure, and site was verified. ? ? ?Injection Procedure Details:  ?Procedure Site One ?Meds Administered:  ?Meds ordered this encounter  ?Medications  ? methylPREDNISolone acetate (DEPO-MEDROL) injection 80 mg  ?  ? ?Laterality: Left ? ?Location/Site:  ?L4-L5 ?L5-S1 ? ?Needle size: 22 guage ? ?Needle type: Spinal ? ?Needle Placement: Articular ? ?Findings: ? -Comments: Excellent flow of contrast producing a partial arthrogram. ? ?Procedure Details: ?The fluoroscope beam is vertically oriented in AP, and the inferior recess is visualized beneath the lower pole of the inferior apophyseal process, which represents the target point for needle insertion. When direct visualization is difficult the target point is located at the medial projection of the vertebral pedicle. The region overlying each aforementioned target is locally anesthetized with a 1 to 2 ml. volume of 1% Lidocaine without Epinephrine.  ? ?The spinal needle was inserted into each of the above mentioned facet joints using biplanar fluoroscopic guidance. A 0.25 to 0.5 ml. volume of Isovue-250 was injected and a partial facet joint arthrogram was obtained. A single spot film was obtained of the resulting arthrogram.    One to 1.25 ml of the steroid/anesthetic solution was then injected into each of the facet joints noted above. ? ? ?Additional Comments:  ?No complications occurred ?Dressing: 2 x 2 sterile gauze and Band-Aid ?  ? ?[name]-procedure details: ?Patient was observed during the procedure. ?[name]-procedure instructions were reviewed. ? ?Patient left the clinic in stable condition. ?   ? ?Clinical History: ?  MRI LUMBAR SPINE WITHOUT CONTRAST ?  ?TECHNIQUE: ?Multiplanar, multisequence MR imaging of the lumbar spine was ?performed. No intravenous contrast was administered. ?  ?COMPARISON:  CT abdomen/pelvis 04/21/2019 ?  ?FINDINGS: ?Segmentation:  Standard; the lowest disc space is designated L5-S1. ?   ?Alignment:  Normal. ?  ?Vertebrae: Vertebral body heights are preserved. Marrow signal is ?normal. ?  ?Conus medullaris and cauda equina: Conus extends to the mid L1 ?level. Conus and cauda equina appear normal. ?  ?Paraspinal and other soft tissues: The paraspinal soft tissues are ?unremarkable. ?  ?Disc levels: ?  ?There is mild disc desiccation without significant loss of height at ?L3-L4. ?  ?There is multilevel facet arthropathy, most advanced at L4-L5. There ?is mild perifacetal soft tissue edema centered at the L4-L5 facet ?joint. ?  ?T12-L1: No significant spinal canal or neural foraminal stenosis. ?  ?L1-L2: No significant spinal canal or neural foraminal stenosis. ?  ?L2-L3: There is a minimal disc bulge without significant spinal ?canal or neural foraminal stenosis. ?  ?L3-L4: There is a mild disc bulge, prominent dorsal epidural fat, ?and mild bilateral facet arthropathy resulting in mild spinal canal ?stenosis and mild bilateral neural foraminal stenosis. There is no ?evidence of nerve root impingement. ?  ?L4-L5: There is a mild disc bulge, ligamentum flavum thickening, ?prominent dorsal epidural fat, and bilateral facet arthropathy ?resulting in mild-to-moderate spinal canal stenosis with crowding of ?the subarticular zones and mild right and no significant left neural ?foraminal stenosis. There is no evidence of nerve root impingement. ?  ?L5-S1: There is a mild disc bulge and bilateral facet arthropathy ?resulting in crowding of the subarticular zones with possible ?contact of the traversing S1 nerve roots, left more than right. ?  ?IMPRESSION: ?1. Multilevel facet arthropathy, most advanced at L4-L5 with ?associated perifacetal soft tissue edema, which could reflect a ?source of pain. ?2. Mild-to-moderate spinal canal stenosis at L4-L5 with crowding of ?the subarticular zones but no evidence of nerve root impingement. ?3. Crowding of the subarticular zones at L5-S1 with possible contact ?of the  traversing S1 nerve roots, left more than right. ?4. Otherwise, no high-grade spinal canal or neural foraminal ?stenosis and no evidence of nerve root impingement at the remaining ?levels. ?  ?  ?Electronically Signed ?  By: Valetta Mole M.D. ?  On: 11/10/2020 10:57  ? ? ? ?Objective:  VS:  HT:    WT:   BMI:     BP:112/76  HR:(!) 103bpm  TEMP: ( )  RESP:  ?Physical Exam ?Vitals and nursing note reviewed.  ?Constitutional:   ?   General: She is not in acute distress. ?   Appearance: Normal appearance. She is not ill-appearing.  ?HENT:  ?   Head: Normocephalic and atraumatic.  ?   Right Ear: External ear normal.  ?   Left Ear: External ear normal.  ?Eyes:  ?   Extraocular Movements: Extraocular movements intact.  ?Cardiovascular:  ?   Rate and Rhythm: Normal rate.  ?   Pulses: Normal pulses.  ?Pulmonary:  ?   Effort: Pulmonary effort is normal. No respiratory distress.  ?Abdominal:  ?   General: There is no distension.  ?   Palpations: Abdomen is soft.  ?Musculoskeletal:     ?   General: Tenderness present.  ?   Cervical back: Neck supple.  ?   Right lower leg: No edema.  ?   Left lower leg: No edema.  ?   Comments: Patient has good distal  strength with no pain over the greater trochanters.  No clonus or focal weakness. Patient somewhat slow to rise from a seated position to full extension.  There is concordant low back pain with facet loading and lumbar spine extension rotation.  There are no definitive trigger points but the patient is somewhat tender across the lower back and PSIS.  There is no pain with hip rotation.   ?Skin: ?   Findings: No erythema, lesion or rash.  ?Neurological:  ?   General: No focal deficit present.  ?   Mental Status: She is alert and oriented to person, place, and time.  ?   Sensory: No sensory deficit.  ?   Motor: No weakness or abnormal muscle tone.  ?   Coordination: Coordination normal.  ?Psychiatric:     ?   Mood and Affect: Mood normal.     ?   Behavior: Behavior normal.  ?   ? ?Imaging: ?No results found. ?

## 2021-05-02 NOTE — Procedures (Signed)
Lumbar Facet Joint Intra-Articular Injection(s) with Fluoroscopic Guidance ? ?Patient: Jordan Jordan [name]      ?Date of Birth: 1960/06/11 ?MRN: [id number] ?PCP: Isaac Bliss, Rayford Halsted, MD      ?Visit Date: 04/25/2021 ?  ?Universal Protocol:    ?Date/Time: 04/25/2021 ? ?Consent Given By: the patient ? ?Position: PRONE  ? ?Additional Comments: ?Vital signs were monitored before and after the procedure. ?Patient was prepped and draped in the usual sterile fashion. ?The correct patient, procedure, and site was verified. ? ? ?Injection Procedure Details:  ?Procedure Site One ?Meds Administered:  ?Meds ordered this encounter  ?Medications  ? methylPREDNISolone acetate (DEPO-MEDROL) injection 80 mg  ?  ? ?Laterality: Left ? ?Location/Site:  ?L4-L5 ?L5-S1 ? ?Needle size: 22 guage ? ?Needle type: Spinal ? ?Needle Placement: Articular ? ?Findings: ? -Comments: Excellent flow of contrast producing a partial arthrogram. ? ?Procedure Details: ?The fluoroscope beam is vertically oriented in AP, and the inferior recess is visualized beneath the lower pole of the inferior apophyseal process, which represents the target point for needle insertion. When direct visualization is difficult the target point is located at the medial projection of the vertebral pedicle. The region overlying each aforementioned target is locally anesthetized with a 1 to 2 ml. volume of 1% Lidocaine without Epinephrine.  ? ?The spinal needle was inserted into each of the above mentioned facet joints using biplanar fluoroscopic guidance. A 0.25 to 0.5 ml. volume of Isovue-250 was injected and a partial facet joint arthrogram was obtained. A single spot film was obtained of the resulting arthrogram.    One to 1.25 ml of the steroid/anesthetic solution was then injected into each of the facet joints noted above. ? ? ?Additional Comments:  ?No complications occurred ?Dressing: 2 x 2 sterile gauze and Band-Aid ?  ? ?Jordan [name]-procedure details: ?Patient was  observed during the procedure. ?Jordan [name]-procedure instructions were reviewed. ? ?Patient left the clinic in stable condition. ?  ?

## 2021-05-02 NOTE — Progress Notes (Signed)
[facility] Counselor/Therapist Progress Note ? ?Patient ID: Jordan [name], MRN: [id number],   ? ?Date: 05/02/2021 ? ?Time Spent: 5:00pm-5:55pm   55 minutes  ? ?Treatment Type: Individual Therapy ? ?Reported Symptoms: sadness, stress ? ?Mental Status Exam: ?Appearance:  Casual     ?Behavior: Appropriate  ?Motor: Normal  ?Speech/Language:  Normal Rate  ?Affect: Appropriate  ?Mood: normal  ?Thought process: normal  ?Thought content:   WNL  ?Sensory/Perceptual disturbances:   WNL  ?Orientation: oriented to person, place, time/date, and situation  ?Attention: Good  ?Concentration: Good  ?Memory: WNL  ?Fund of knowledge:  Good  ?Insight:   Good  ?Judgment:  Good  ?Impulse Control: Good  ? ?Risk Assessment: ?Danger to Self:  No ?Self-injurious Behavior: No ?Danger to Others: No ?Duty to Warn:no ?Physical Aggression / Violence:No  ?Access to Firearms a concern: No  ?Gang Involvement:No  ? ?Subjective:  ?Pt present for face-to-face individual therapy via video Webex.  Pt consents to telehealth video session due to COVID 19 pandemic. ?Location of pt: home ?Location of therapist: home office.   ?Pt states she has had a difficult couple of weeks.  She has felt lonely.  She has had some self deprecating thoughts.  She is having trouble getting things done.  Addressed pt's negative thoughts about herself.    Pt is having trouble accepting who she is now compared to how she use to be.  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   She states she needs to accept that she is older and not doing what she use to.  She feels somewhat like a failure.  Worked on self acceptance and improving self esteem.   ?Pt is in physical therapy for her hip. She has been in pain which has affected her mood.   ?Pt states she is doing better with not worrying as much about her dog's health.  She is trying to embrace the time she has with her [place].  ?Pt works 2 days a week at [employer] and goes to  [employer] on Sundays.   Pt needs the social contact and feels she needs a little more than she is getting.    Addressed how pt can increase her social connections.   Pt is service oriented and reaches out to help others.  Her motto is to do one kind and helpful thing a day.   Pt states she does not have a close friend bc she keeps an emotional wall up bc of being hurt in the past.    ?Worked on increasing self care.   ?Provided supportive therapy. ? ? ?Interventions: Cognitive Behavioral Therapy and Insight-Oriented ? ?Diagnosis:F33.1 ? ?Plan:  ?Plan of Care: Recommend ongoing therapy.  Pt participated in setting treatment goals.  She wants to improve coping skills and self esteem.   ?Plan to meet every couple of weeks.   ? ?Treatment Plan (Treatment Plan Target Date:  03/24/2022) ?Client Abilities/Strengths  ?Pt is bright, engaging, and motivated for therapy.   ?Client Treatment Preferences  ?Individual therapy.  ?Client Statement of Needs  ?Improve coping skills.  ?Symptoms  ?Depressed or irritable mood. ?Feelings of hopelessness, worthlessness, or inappropriate guilt. ?Low self-esteem. ?Unresolved grief issues. ? ? ?Problems Addressed  ?Unipolar Depression ?Goals ?1. Alleviate depressive symptoms and return to previous level of effective functioning. ?2. Appropriately grieve the loss in order to normalize mood and to return to previously adaptive level of functioning. ?Objective ?Learn and implement behavioral  strategies to overcome depression. ?Target Date: 2022-03-24 Frequency: Biweekly  ?Progress: 10 Modality: individual  ?Related Interventions ?Engage the client in "behavioral activation," increasing his/her activity level and contact with sources of reward, while identifying processes that inhibit activation.  Use behavioral techniques such as instruction, rehearsal, role-playing, role reversal, as needed, to facilitate activity in the client's daily life; reinforce success. ?Assist the client in  developing skills that increase the likelihood of deriving pleasure from behavioral activation (e.g., assertiveness skills, developing an exercise plan, less internal/more external focus, increased social involvement); reinforce success. ?Objective ?Identify important people in life, past and present, and describe the quality, good and poor, of those relationships. ?Target Date: 2022-03-24 Frequency: Biweekly  ?Progress: 10 Modality: individual  ?Related Interventions ?Conduct Interpersonal Therapy beginning with the assessment of the client's "interpersonal inventory" of important past and present relationships; develop a case formulation linking depression to grief, interpersonal role disputes, role transitions, and/or interpersonal deficits). ?Objective ?Learn and implement problem-solving and decision-making skills. ?Target Date: 2022-03-24 Frequency: Biweekly  ?Progress: 10 Modality: individual  ?Related Interventions ?Conduct Problem-Solving Therapy using techniques such as psychoeducation, modeling, and role-playing to teach client problem-solving skills (i.e., defining a problem specifically, generating possible solutions, evaluating the pros and cons of each solution, selecting and implementing a plan of action, evaluating the efficacy of the plan, accepting or revising the plan); role-play application of the problem-solving skill to a real life issue. ?Encourage in the client the development of a positive problem orientation in which problems and solving them are viewed as a natural part of life and not something to be feared, despaired, or avoided. ?3. Develop healthy interpersonal relationships that lead to the alleviation and help prevent the relapse of depression. ?4. Develop healthy thinking patterns and beliefs about self, others, and the world that lead to the alleviation and help prevent the relapse of depression. ?5. Recognize, accept, and cope with feelings of depression. ?Diagnosis ?F33.1   ?Conditions For Discharge ?Achievement of treatment goals and objectives  ? ? ? ?Cianni Manny, LCSW ? ? ? ?

## 2021-05-03 ENCOUNTER — Encounter: Payer: Self-pay | Admitting: Gastroenterology

## 2021-05-03 ENCOUNTER — Encounter: Payer: Self-pay | Admitting: Orthopaedic Surgery

## 2021-05-03 ENCOUNTER — Telehealth: Payer: Self-pay | Admitting: Gastroenterology

## 2021-05-03 MED ORDER — ESOMEPRAZOLE MAGNESIUM 40 MG PO CPDR: DELAYED_RELEASE_CAPSULE | ORAL | 0 refills | Status: DC

## 2021-05-03 NOTE — Telephone Encounter (Signed)
Patient has upcoming appointment on 4-20 with Dr. Havery Moros.  N[facility] day Refill sent to mail order pharmacy ?

## 2021-05-03 NOTE — Telephone Encounter (Signed)
Patient called requesting refill on Nexium ?

## 2021-05-04 ENCOUNTER — Ambulatory Visit: Payer: Medicare Other | Admitting: Physical Therapy

## 2021-05-04 ENCOUNTER — Encounter: Payer: Self-pay | Admitting: Physical Therapy

## 2021-05-04 ENCOUNTER — Other Ambulatory Visit: Payer: Self-pay

## 2021-05-04 DIAGNOSIS — G8929 Other chronic pain: Secondary | ICD-10-CM | POA: Diagnosis not present

## 2021-05-04 DIAGNOSIS — R252 Cramp and spasm: Secondary | ICD-10-CM | POA: Diagnosis not present

## 2021-05-04 DIAGNOSIS — M6281 Muscle weakness (generalized): Secondary | ICD-10-CM

## 2021-05-04 DIAGNOSIS — M25552 Pain in left hip: Secondary | ICD-10-CM

## 2021-05-04 DIAGNOSIS — M5442 Lumbago with sciatica, left side: Secondary | ICD-10-CM | POA: Diagnosis not present

## 2021-05-04 NOTE — Therapy (Signed)
?OUTPATIENT PHYSICAL THERAPY TREATMENT NOTE ? ? ?Patient Name: Jordan [name] ?MRN: [id number] ?DOB:06/11/1960, 61 y.o., female ?Today's Date: 05/04/2021 ? ?PCP: Isaac Bliss, Rayford Halsted, MD ?REFERRING PROVIDER: Mcarthur Rossetti* ? ? PT End of Session - 05/04/21 1255   ? ? Visit Number 2   ? Number of Visits 6   ? Date for PT Re-Evaluation 05/30/21   ? Authorization Type [employer] Medicare   ? Progress Note Due on Visit 10   ? PT Start Time 1700   ? PT Stop Time 1340   ? PT Time Calculation (min) 43 min   ? Activity Tolerance Patient tolerated treatment well   ? Behavior During Therapy [facility] for tasks assessed/performed   ? ?  ?  ? ?  ? ? ?Past Medical History:  ?Diagnosis Date  ? Acute upper respiratory infections of unspecified site   ? ADD (attention deficit disorder)   ? Allergic rhinitis due to pollen   ? Anal fissure   ? Anemia, unspecified   ? Anxiety disorder   ? Asthma   ? Chicken pox   ? Complication of anesthesia   ? wakes up slow  ? Depressive disorder, not elsewhere classified   ? Diffuse cystic mastopathy   ? Falls   ? Fibromyalgia   ? GERD (gastroesophageal reflux disease)   ? Headache(784.0)   ? History of anorexia nervosa   ? History of bulimia   ? IBS (irritable bowel syndrome)   ? 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 Lump or mass in breast   ? Meningitis, unspecified(322.9)   ? Oral thrush   ? Other specified glaucoma   ? Perforation of tympanic membrane, unspecified   ? Radial styloid tenosynovitis   ? Seizures ([place])   ? ?Past Surgical History:  ?Procedure Laterality Date  ? ADENOIDECTOMY    ? ANKLE SURGERY  1992  ? right ankle - scar tissue  ? DILATION AND CURETTAGE OF UTERUS  2006  ? History of Menometrorrhagia  ? [place] to 1986  ? 6 surgeries on right ear  ? LAPAROSCOPY  1993  ? normal  ? REDUCTION MAMMAPLASTY Bilateral 2018  ? uterine ablation  2011  ? WRIST SURGERY  2001  ? for de-quervaine - bilateral wrists - tendonitis  ? ?Patient Active Problem List  ? Diagnosis Date  Noted  ? IGT (impaired glucose tolerance) 03/24/2021  ? Iron deficiency anemia 03/24/2021  ? Hyperlipidemia 03/24/2021  ? Low back pain 10/19/2020  ? Severe recurrent major depression without psychotic features ([place]) 09/27/2015  ?  Class: Chronic  ? Fibromyalgia 04/03/2013  ? Back pain, lumbosacral 04/03/2013  ? Degeneration of cervical intervertebral disc 04/03/2013  ? Dyspepsia and disorder of function of stomach 03/16/2012  ? Routine health maintenance 01/03/2012  ? Mild cognitive impairment with memory loss 01/03/2012  ? Cough 07/03/2011  ? Bilateral dry eyes 05/13/2011  ? Back pain, thoracic 06/15/2010  ? MULTIPLE SCLEROSIS, PROGRESSIVE/RELAPSING 06/02/2009  ? Incontinence without sensory awareness 06/01/2009  ? NASAL POLYP 05/14/2009  ? Panic disorder without agoraphobia 04/25/2007  ? Carpal tunnel syndrome 04/25/2007  ? NECK PAIN, CHRONIC 02/19/2007  ? HAY FEVER 02/18/2007  ? ANEMIA-NOS 12/05/2006  ? Depression, recurrent ([place]) 12/05/2006  ? GLAUCOMA NEC 12/05/2006  ? TYMPANIC MEMBRANE PERFORATION 12/05/2006  ? FIBROCYSTIC BREAST DISEASE 12/05/2006  ? DE QUERVAIN'S TENOSYNOVITIS 12/05/2006  ? Headache 12/05/2006  ? BULIMIA, HX OF 12/05/2006  ? ANOREXIA, HX OF 12/05/2006  ? ? ?  REFERRING DIAG: M54.50,G89.29 (ICD-10-CM) - Chronic left-sided low back pain without sciatica M25.552 (ICD-10-CM) - Pain in left hip  ? ?THERAPY DIAG:  ?Pain in left hip ? ?Muscle weakness (generalized) ? ?Chronic left-sided low back pain with left-sided sciatica ? ?Cramp and spasm ? ?PERTINENT HISTORY: ADD, anxiety, asthma, depression, fibromyalgia, meningitis, seizures ? ?PRECAUTIONS: none ? ?SUBJECTIVE: Doing well; had injection in back which has helped, has pelvis MRI ordered ? ?PAIN:  ?Are you having pain? Yes ?NPRS scale: 0/10 ? ? ?OBJECTIVE:  ?  ?DIAGNOSTIC FINDINGS:  ?MRI: Lt hip labral tear ?  ?PATIENT SURVEYS:  ?04/18/21 FOTO 46 (predicted 48) ?  ?LUMBARAROM/PROM ?  ?A/PROM A/PROM  ?04/18/2021  ?Flexion WNL  ?Extension WNL with  pain  ?Right lateral flexion WNL  ?Left lateral flexion WNL with pain on LT  ?Right rotation WNL  ?Left rotation WNL  ? (Blank rows = not tested) ?  ?  ?LE MMT: ?  ?MMT Right ?04/18/2021 Left ?04/18/2021  ?Hip flexion 5/5 5/5  ?Hip extension 4/5 3+/5  ?Hip abduction 4/5 3+/5  ?Hip adduction 4/5 3+/5  ?Hip internal rotation 4/5 4/5  ?Hip external rotation 3+/5 3+/5  ? (Blank rows = not tested) ?  ?LUMBAR SPECIAL TESTS:  ?Deferred today ?  ?  ?GAIT: ?                    independent ?  ?  ?TODAY'S TREATMENT  ?05/04/21 ?Therex: ?     Aerobic: NuStep L6 x 6 min ?     Supine: ?Bridges 10 reps x 5 sec hold ?Bridges with march x 10 reps ?Mini Crunch 2x10 ?Tabletop alternating hip extension x 10 reps bil ?     Prone:  ?Plank 30 sec (1st rep up on elbows, 2nd/3rd reps on knees/elbows ?     Standing: ?Walking lunges 10' x 4 ?Lateral lunges x 10 reps bil ?Squats 2x10 reps  ?RDL holding 5# DB 2x10 ? ?04/18/21:  See HEP, performed trial reps PRN and reviewed exercises with pt ?  ?  ?PATIENT EDUCATION:  ?Education details: HEP ?Person educated: Patient ?Education method: Explanation, Demonstration, and Handouts ?Education comprehension: verbalized understanding, returned demonstration, and needs further education ?  ?  ?HOME EXERCISE PROGRAM: ?Access Code: [email or web address] ?URL: [email or web address] ?Date: 04/18/2021 ?Prepared by: Faustino Congress ?  ?Exercises ?Supine Bridge - 2 x daily - 7 x weekly - 1 sets - 10 reps - 5 sec hold ?Marching Bridge - 1 x daily - 7 x weekly - 3 sets - 10 reps ?Ab Prep - 1 x daily - 7 x weekly - 3 sets - 10 reps ?Bilateral Bent Leg Lift - 1 x daily - 7 x weekly - 3 sets - 10 reps ?Standard Plank - 1 x daily - 7 x weekly - 3 sets - 10 reps ?Standard Lunge - 1 x daily - 7 x weekly - 3 sets - 10 reps ?Lateral Lunge - 1 x daily - 7 x weekly - 3 sets - 10 reps ?Squat - 1 x daily - 7 x weekly - 3 sets - 10 reps ?Kettlebell Deadlift - 1 x daily - 7 x weekly - 3 sets - 10 reps ?  ?  ?ASSESSMENT: ?   ?CLINICAL IMPRESSION: ?Session today focused on review of HEP and recommended pt continue with current HEP.  No pain after session.  Will continue to benefit from PT to maximize function. ?  ?OBJECTIVE IMPAIRMENTS decreased mobility, decreased strength, increased fascial restrictions, increased  muscle spasms, and pain.  ?  ?ACTIVITY LIMITATIONS cleaning, community activity, driving, occupation, laundry, and yard work.  ?  ?PERSONAL FACTORS Past/current experiences and 3+ comorbidities: ADD, anxiety, asthma, depression, fibromyalgia, meningitis, seizures  are also affecting patient's functional outcome.  ?  ?  ?REHAB POTENTIAL: Excellent ?  ?CLINICAL DECISION MAKING: Stable/uncomplicated ?  ?EVALUATION COMPLEXITY: Low ?  ?  ?GOALS: ?Goals reviewed with patient? Yes ?  ?SHORT TERM GOALS: ?  ?STG Name Target Date Goal status  ?1 Independent with initial HEP ?Baseline:  05/09/2021 MET ?05/04/21  ?  ?LONG TERM GOALS:  ?  ?LTG Name Target Date Goal status  ?1 Independent with final HEP ?Baseline: 05/30/2021 INITIAL  ?2 FOTO score improved to 48 for improved function ?Baseline: 05/30/2021 INITIAL  ?3 Lt hip strength improved to 4/5 for improved function ?Baseline: 05/30/2021 INITIAL  ?4 Report pain < 5/10 with activity and at highest for improved function  ?Baseline: 05/30/2021 INITIAL  ?  ?  ?  ?PLAN: ?PT FREQUENCY: 1x/week ?  ?PT DURATION: 6 weeks ?  ?PLANNED INTERVENTIONS: Therapeutic exercises, Therapeutic activity, Neuro Muscular re-education, Patient/Family education, Joint mobilization, Stair training, Dry Needling, Electrical stimulation, Cryotherapy, Moist heat, Taping, Traction, and Manual therapy ?  ?PLAN FOR NEXT SESSION: continue with gym exercises/hip strengthening   ? ? ? ?Faustino Congress, PT ?05/04/2021, 1:40 PM ? ?  ? ?

## 2021-05-06 ENCOUNTER — Encounter: Payer: Self-pay | Admitting: Physical Medicine and Rehabilitation

## 2021-05-11 ENCOUNTER — Other Ambulatory Visit: Payer: Medicare Other

## 2021-05-11 ENCOUNTER — Encounter: Payer: Medicare Other | Admitting: Physical Therapy

## 2021-05-17 ENCOUNTER — Ambulatory Visit: Payer: Medicare Other | Admitting: Psychology

## 2021-05-17 ENCOUNTER — Ambulatory Visit: Payer: Medicare Other | Admitting: Behavioral Health

## 2021-05-18 ENCOUNTER — Encounter: Payer: Self-pay | Admitting: Physical Therapy

## 2021-05-18 ENCOUNTER — Ambulatory Visit: Payer: Medicare Other | Admitting: Physical Therapy

## 2021-05-18 DIAGNOSIS — G8929 Other chronic pain: Secondary | ICD-10-CM

## 2021-05-18 DIAGNOSIS — M6281 Muscle weakness (generalized): Secondary | ICD-10-CM

## 2021-05-18 DIAGNOSIS — R252 Cramp and spasm: Secondary | ICD-10-CM | POA: Diagnosis not present

## 2021-05-18 DIAGNOSIS — M5442 Lumbago with sciatica, left side: Secondary | ICD-10-CM

## 2021-05-18 DIAGNOSIS — M25552 Pain in left hip: Secondary | ICD-10-CM

## 2021-05-18 NOTE — Therapy (Addendum)
OUTPATIENT PHYSICAL THERAPY TREATMENT NOTE DISCHARGE SUMMARY    Patient Name: Jordan [name] MRN: [id number] DOB:1960/08/07, 61 y.o., female Today's Date: 05/18/2021  PCP: Isaac Bliss, Rayford Halsted, MD REFERRING PROVIDER: Mcarthur Rossetti*   PT End of Session - 05/18/21 1344     Visit Number 3    Number of Visits 6    Date for PT Re-Evaluation 05/30/21    Authorization Type UHC Medicare    Progress Note Due on Visit 10    PT Start Time 1340    PT Stop Time 1423    PT Time Calculation (min) 43 min    Activity Tolerance Patient tolerated treatment well    Behavior During Therapy WFL for tasks assessed/performed              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    Lump or mass in breast    Meningitis, unspecified(322.9)    Oral thrush    Other specified glaucoma    Perforation of tympanic membrane, unspecified    Radial styloid tenosynovitis    Seizures ([place])    Past Surgical History:  Procedure Laterality Date   ADENOIDECTOMY     ANKLE SURGERY  1992   right ankle - scar tissue   DILATION AND CURETTAGE OF UTERUS  2006   History of Menometrorrhagia   INNER EAR SURGERY  1966 to 1986   6 surgeries on right ear   LAPAROSCOPY  1993   normal   REDUCTION MAMMAPLASTY Bilateral 2018   uterine ablation  2011   WRIST SURGERY  2001   for de-quervaine - bilateral wrists - tendonitis   Patient Active Problem  List   Diagnosis Date Noted   IGT (impaired glucose tolerance) 03/24/2021   Iron deficiency anemia 03/24/2021   Hyperlipidemia 03/24/2021   Low back pain 10/19/2020   Severe recurrent major depression without psychotic features ([place]) 09/27/2015    Class: Chronic   Fibromyalgia 04/03/2013   Back pain, lumbosacral 04/03/2013   Degeneration of cervical intervertebral disc 04/03/2013   Dyspepsia and disorder of function of stomach 03/16/2012   Routine health maintenance 01/03/2012   Mild cognitive impairment with memory loss 01/03/2012   Cough 07/03/2011   Bilateral dry eyes 05/13/2011   Back pain, thoracic 06/15/2010   MULTIPLE SCLEROSIS, PROGRESSIVE/RELAPSING 06/02/2009   Incontinence without sensory awareness 06/01/2009   NASAL POLYP 05/14/2009   Panic disorder without agoraphobia 04/25/2007   Carpal tunnel syndrome 04/25/2007   NECK PAIN, CHRONIC 02/19/2007   HAY FEVER 02/18/2007   ANEMIA-NOS 12/05/2006   Depression, recurrent ([place]) 12/05/2006   GLAUCOMA NEC 12/05/2006   TYMPANIC MEMBRANE PERFORATION 12/05/2006   FIBROCYSTIC BREAST DISEASE 12/05/2006   DE QUERVAIN'S TENOSYNOVITIS 12/05/2006   Headache 12/05/2006   BULIMIA, HX OF 12/05/2006   ANOREXIA, HX OF  12/05/2006    REFERRING DIAG: M54.50,G89.29 (ICD-10-CM) - Chronic left-sided low back pain without sciatica M25.552 (ICD-10-CM) - Pain in left hip   THERAPY DIAG:  Pain in left hip  Muscle weakness (generalized)  Chronic left-sided low back pain with left-sided sciatica  Cramp and spasm  PERTINENT HISTORY: ADD, anxiety, asthma, depression, fibromyalgia, meningitis, seizures  PRECAUTIONS: none  SUBJECTIVE: a little sore after last session but not bad; felt she was lazy last week and has been overly exhausted. Is working on [employer] as able.  Pain still present but "not as bad."  PAIN:  Are you having pain? Yes NPRS scale: 3/10, up to 7/10 Location: Rt > Lt low back/hip Alleviating: medication, stretching, rest,  heat Aggravating: yardwork   OBJECTIVE:    DIAGNOSTIC FINDINGS:  MRI: Lt hip labral tear   PATIENT SURVEYS:  04/18/21 FOTO 46 (predicted 48) 05/18/21 FOTO 51   LUMBARAROM/PROM   A/PROM A/PROM  04/18/2021  Flexion WNL  Extension WNL with pain  Right lateral flexion WNL  Left lateral flexion WNL with pain on LT  Right rotation WNL  Left rotation WNL   (Blank rows = not tested)     LE MMT:   MMT Right 04/18/2021 Left 04/18/2021  Hip flexion 5/5 5/5  Hip extension 4/5 3+/5  Hip abduction 4/5 3+/5  Hip adduction 4/5 3+/5  Hip internal rotation 4/5 4/5  Hip external rotation 3+/5 3+/5   (Blank rows = not tested)   LUMBAR SPECIAL TESTS:  Deferred today     GAIT:                     independent     TODAY'S TREATMENT  05/18/21 Therex:      Aerobic: NuStep L6 x 8 min      Supine: SKTC 3x30 sec bil Piriformis stretch 3x30 sec bil Mini Crunch 2x10 Bridges 20 reps x 5 sec hold     Sidelying: Clamshell x 20 reps bil  05/04/21 Therex:      Aerobic: NuStep L6 x 6 min      Supine: Bridges 10 reps x 5 sec hold Bridges with march x 10 reps Mini Crunch 2x10 Tabletop alternating hip extension x 10 reps bil      Prone:  Plank 30 sec (1st rep up on elbows, 2nd/3rd reps on knees/elbows      Standing: Walking lunges 10' x 4 Lateral lunges x 10 reps bil Squats 2x10 reps  RDL holding 5# DB 2x10  04/18/21:  See HEP, performed trial reps PRN and reviewed exercises with pt     PATIENT EDUCATION:  Education details: HEP Person educated: Patient Education method: Explanation, Demonstration, and Handouts Education comprehension: verbalized understanding, returned demonstration, and needs further education     HOME EXERCISE PROGRAM: Access Code: [email or web address] URL: [email or web address] Date: 04/18/2021 Prepared by: Faustino Congress   Exercises Supine Bridge - 2 x daily - 7 x weekly - 1 sets - 10 reps - 5 sec hold Marching Bridge - 1 x daily - 7 x weekly -  3 sets - 10 reps Ab Prep - 1 x daily - 7 x weekly - 3 sets - 10 reps Bilateral Bent Leg Lift - 1 x daily - 7 x weekly - 3 sets - 10 reps Standard Plank - 1 x daily - 7 x weekly - 3 sets - 10 reps Standard Lunge - 1 x daily - 7 x weekly - 3 sets - 10 reps Lateral Lunge - 1  x daily - 7 x weekly - 3 sets - 10 reps Squat - 1 x daily - 7 x weekly - 3 sets - 10 reps Kettlebell Deadlift - 1 x daily - 7 x weekly - 3 sets - 10 reps     ASSESSMENT:   CLINICAL IMPRESSION: Pt tolerated session well and has surpassed FOTO goal at this time.  Overall improving with PT.     OBJECTIVE IMPAIRMENTS decreased mobility, decreased strength, increased fascial restrictions, increased muscle spasms, and pain.    ACTIVITY LIMITATIONS cleaning, community activity, driving, occupation, laundry, and yard work.    PERSONAL FACTORS Past/current experiences and 3+ comorbidities: ADD, anxiety, asthma, depression, fibromyalgia, meningitis, seizures  are also affecting patient's functional outcome.      REHAB POTENTIAL: Excellent   CLINICAL DECISION MAKING: Stable/uncomplicated   EVALUATION COMPLEXITY: Low     GOALS: Goals reviewed with patient? Yes   SHORT TERM GOALS:   STG Name Target Date Goal status  1 Independent with initial HEP Baseline:  05/09/2021 MET 05/04/21    LONG TERM GOALS:    LTG Name Target Date Goal status  1 Independent with final HEP Baseline: 05/30/2021 INITIAL  2 FOTO score improved to 48 for improved function Baseline: 05/30/2021 MET 05/18/21  3 Lt hip strength improved to 4/5 for improved function Baseline: 05/30/2021 INITIAL  4 Report pain < 5/10 with activity and at highest for improved function  Baseline: 05/30/2021 INITIAL        PLAN: PT FREQUENCY: 1x/week   PT DURATION: 6 weeks   PLANNED INTERVENTIONS: Therapeutic exercises, Therapeutic activity, Neuro Muscular re-education, Patient/Family education, Joint mobilization, Stair training, Dry Needling, Electrical  stimulation, Cryotherapy, Moist heat, Taping, Traction, and Manual therapy   PLAN FOR NEXT SESSION: continue with gym exercises/hip strengthening      Faustino Congress, PT 05/18/2021, 2:24 PM     PHYSICAL THERAPY DISCHARGE SUMMARY  Visits from Start of Care: 3  Current functional level related to goals / functional outcomes: See above   Remaining deficits: See above, otherwise unknown   Education / Equipment: HEP   Patient agrees to discharge. Patient goals were not met. Patient is being discharged due to not returning since the last visit.  Laureen Abrahams, PT, DPT 07/19/21 9:17 AM  [facility] [address] [place], [place], [address] Phone: [phone]   Fax:  [phone]

## 2021-05-19 ENCOUNTER — Ambulatory Visit: Payer: Medicare Other | Admitting: Behavioral Health

## 2021-05-30 ENCOUNTER — Ambulatory Visit
Admission: RE | Admit: 2021-05-30 | Discharge: 2021-05-30 | Disposition: A | Discharge status: Home / Self Care | Payer: Medicare Other | Source: Ambulatory Visit | Attending: Orthopaedic Surgery | Admitting: Orthopaedic Surgery

## 2021-05-30 DIAGNOSIS — M25552 Pain in left hip: Secondary | ICD-10-CM

## 2021-05-31 ENCOUNTER — Other Ambulatory Visit: Payer: Self-pay

## 2021-05-31 ENCOUNTER — Ambulatory Visit: Payer: Medicare Other | Admitting: Psychology

## 2021-05-31 ENCOUNTER — Encounter: Payer: Self-pay | Admitting: Orthopaedic Surgery

## 2021-05-31 MED ORDER — DIAZEPAM 5 MG PO TABS: ORAL_TABLET | ORAL | 0 refills | Status: DC

## 2021-06-01 ENCOUNTER — Encounter: Payer: Medicare Other | Admitting: Physical Therapy

## 2021-06-03 ENCOUNTER — Other Ambulatory Visit: Payer: Self-pay | Admitting: Internal Medicine

## 2021-06-03 DIAGNOSIS — M545 Low back pain, unspecified: Secondary | ICD-10-CM

## 2021-06-06 ENCOUNTER — Encounter: Payer: Self-pay | Admitting: Internal Medicine

## 2021-06-08 ENCOUNTER — Encounter: Payer: Medicare Other | Admitting: Physical Therapy

## 2021-06-08 ENCOUNTER — Encounter: Payer: Self-pay | Admitting: Physical Therapy

## 2021-06-08 ENCOUNTER — Ambulatory Visit (INDEPENDENT_AMBULATORY_CARE_PROVIDER_SITE_OTHER): Payer: Medicare Other | Admitting: Behavioral Health

## 2021-06-08 ENCOUNTER — Encounter: Payer: Self-pay | Admitting: Behavioral Health

## 2021-06-08 ENCOUNTER — Telehealth: Payer: Self-pay | Admitting: Physical Therapy

## 2021-06-08 VITALS — BP 141/80 | HR 92 | Ht 62.0 in | Wt 144.0 lb

## 2021-06-08 DIAGNOSIS — F900 Attention-deficit hyperactivity disorder, predominantly inattentive type: Secondary | ICD-10-CM

## 2021-06-08 DIAGNOSIS — F411 Generalized anxiety disorder: Secondary | ICD-10-CM

## 2021-06-08 DIAGNOSIS — F331 Major depressive disorder, recurrent, moderate: Secondary | ICD-10-CM | POA: Diagnosis not present

## 2021-06-08 MED ORDER — BUSPIRONE HCL 15 MG PO TABS: ORAL_TABLET | ORAL | 1 refills | Status: DC

## 2021-06-08 NOTE — Telephone Encounter (Signed)
LVM for pt as she did not show for PT appt today.  Advised of next scheduled appt and to call the office if she had any questions. ? ?Laureen Abrahams, PT, DPT ?06/08/21 2:49 PM ? ? ?

## 2021-06-08 NOTE — Progress Notes (Signed)
[facility] MD/PA/NP Initial Note ? ?06/08/2021 2:20 PM ?[place]  ?MRN:  [id number] ? ?Chief Complaint:  ?Chief Complaint   ?Anxiety; Depression; Follow-up; Medication Problem; Patient Education ?  ? ? ?HPI: ? ?61 year old patient presents to this office for initial visit and to establish care. She says she was a patient of Dr. Toy Care for many years but needed a change. She says that she has struggled with anxiety and depression since 1995. She was hospitalized for same in [place]. She has been diagnosed with treatment resistant depression. Says she has been on the same medication for many years and is wondering if she needs a change. She says she has been doing fine most days and A&D is controlled. However she has developed more anxiety from anticipating the death of her aging dog. She believes her depression is manageable but would like something to help more with the daily anxiety.  She says that she also struggles with loneliness. She was married for about 5 years but divorced in 2006. She does not have children so her pet is very close to her. Says that she does not have friends or support network nearby. She says she is safe and would not harm herself. She verbally contracts for safety. She denies any current or prior mania.No psychosis, no auditory or visual hallucinations. Strong family hx of dementia. No SI or HI. ? ?Prior psychiatric medication trials: ? ?Abilify ?Latuda ?Risperdone ?Amitriptyline ?Trintellix ?Lexapro ?Prozac ?Zoloft ?Paxil ?Wellbutrin ?Remeron ?Effexor ?Cymbalta ? ? ?Visit Diagnosis:  ?  ICD-10-CM   ?1. Major depressive disorder, recurrent episode, moderate (HCC)  F33.1   ?  ?2. Generalized anxiety disorder  F41.1 busPIRone (BUSPAR) 15 MG tablet  ?  ?3. Attention deficit hyperactivity disorder (ADHD), predominantly inattentive type  F90.0   ?  ? ? ?Past Psychiatric History: MDD, Anxiety, ADHD ? ?Past Medical History:  ?Past Medical History:  ?Diagnosis Date  ? Acute  upper respiratory infections of unspecified site   ? ADD (attention deficit disorder)   ? Allergic rhinitis due to pollen   ? Anal fissure   ? Anemia, unspecified   ? Anxiety disorder   ? Asthma   ? Chicken pox   ? Complication of anesthesia   ? wakes up slow  ? Depressive disorder, not elsewhere classified   ? Diffuse cystic mastopathy   ? Falls   ? Fibromyalgia   ? GERD (gastroesophageal reflux disease)   ? Headache(784.0)   ? History of anorexia nervosa   ? History of bulimia   ? IBS (irritable bowel syndrome)   ? 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 Lump or mass in breast   ? Meningitis, unspecified(322.9)   ? Oral thrush   ? Other specified glaucoma   ? Perforation of tympanic membrane, unspecified   ? Radial styloid tenosynovitis   ? Seizures ([place])   ?  ?Past Surgical History:  ?Procedure Laterality Date  ? ADENOIDECTOMY    ? ANKLE SURGERY  1992  ? right ankle - scar tissue  ? DILATION AND CURETTAGE OF UTERUS  2006  ? History of Menometrorrhagia  ? [place] to 1986  ? 6 surgeries on right ear  ? LAPAROSCOPY  1993  ? normal  ? REDUCTION MAMMAPLASTY Bilateral 2018  ? uterine ablation  2011  ? WRIST SURGERY  2001  ? for de-quervaine - bilateral wrists - tendonitis  ? ? ?Family Psychiatric History: see chart ? ?Family History:  ?  Family History  ?Problem Relation Age of Onset  ? Dementia Mother   ? Alcohol abuse Mother   ? Ovarian cancer Mother   ? Heart disease Mother   ? Hypertension Mother   ? Coronary artery disease Mother   ? Colon polyps Mother 31  ? Heart failure Father   ? Hypertension Father   ? Hyperlipidemia Father   ? Diabetes Father   ? Alcohol abuse Sister   ? Anxiety disorder Maternal Aunt   ? Depression Maternal Aunt   ? Alcohol abuse Maternal Aunt   ? Alcohol abuse Maternal Uncle   ? Alcohol abuse Maternal Grandfather   ? Breast cancer Maternal Grandmother   ? Colon cancer Neg Hx   ? Esophageal cancer Neg Hx   ? Stomach cancer Neg Hx   ? Rectal cancer Neg Hx   ? ? ?Social History:  ?Social  History  ? ?Socioeconomic History  ? Marital status: Divorced  ?  Spouse name: Not on file  ? Number of children: 0  ? Years of education: Masters  ? Highest education level: Not on file  ?Occupational History  ? Occupation: part time Designer, industrial/product  ? Occupation: disability  ?Tobacco Use  ? Smoking status: Never  ? Smokeless tobacco: Never  ?Vaping Use  ? Vaping Use: Never used  ?Substance and Sexual Activity  ? Alcohol use: Yes  ?  Alcohol/week: 7.0 standard drinks  ?  Types: 7 Glasses of wine per week  ?  Comment: 2 glasses of wine per night  ? Drug use: No  ? Sexual activity: Not Currently  ?Other Topics Concern  ? Not on file  ?Social History Narrative  ? [employer], [employer] BA, App for MA. [place], [place] land. 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 Married 5 years - divorced, no children  ? Patient is right handed  ? Patient drinks 1 cup of coffee daily.  ?   ?   ?   ?   ? ?Social Determinants of Health  ? ?Financial Resource Strain: Not on file  ?Food Insecurity: Not on file  ?Transportation Needs: Not on file  ?Physical Activity: Not on file  ?Stress: Not on file  ?Social Connections: Not on file  ? ? ?Allergies:  ?Allergies  ?Allergen Reactions  ? Prednisone Rash  ?  Face redness and swollen per patient  ? Pregabalin Other (See Comments)  ?  Delusions  ? Remeron [Mirtazapine] Other (See Comments)  ?  Severe hallucinations  ? ? ?Metabolic Disorder Labs: ?Lab Results  ?Component Value Date  ? HGBA1C 6.1 03/21/2021  ? ?No results found for: PROLACTIN ?Lab Results  ?Component Value Date  ? CHOL 193 03/21/2021  ? TRIG 202.0 (H) 03/21/2021  ? HDL 48.20 03/21/2021  ? CHOLHDL 4 03/21/2021  ? VLDL 40.4 (H) 03/21/2021  ? [place] 99 04/21/2020  ? LDLCALC 106 (H) 08/15/2013  ? ?Lab Results  ?Component Value Date  ? TSH 1.30 04/21/2020  ? TSH 1.58 08/15/2013  ? ? ?Therapeutic Level Labs: ?No results found for: LITHIUM ?No results found for: VALPROATE ?No components  found for:  CBMZ ? ?Current Medications: ?Current Outpatient Medications  ?Medication Sig Dispense Refill  ? ALPRAZolam (XANAX) 0.5 MG tablet Take 0.5 mg by mouth at bedtime as needed. For sleep.  Also 1/2 tab during the day.    ? amphetamine-dextroamphetamine (ADDERALL XR) 15 MG 24 hr capsule Take by  mouth.    ? amphetamine-dextroamphetamine (ADDERALL XR) 30 MG 24 hr capsule Take 30 mg by mouth every morning. Pt reported 20 mg    ? brexpiprazole (REXULTI) 1 MG TABS tablet Take 1 tablet by mouth. Every 5 days    ? busPIRone (BUSPAR) 15 MG tablet Take 1/3 tablet p.o. twice daily for 1 week, then take 2/3 tablet p.o. twice daily for 1 week, then take 1 tablet p.o. twice daily 30 tablet 1  ? cyclobenzaprine (FLEXERIL) 10 MG tablet TAKE ONE TABLET BY MOUTH EVERY NIGHT AT BEDTIME AS NEEDED FOR MUSCLE SPASMS 30 tablet 2  ? dicyclomine (BENTYL) 10 MG capsule TAKE 1 CAPSULE BY MOUTH EVERY 8 HOURS AS NEEDED FOR SPASMS 60 capsule 2  ? DULoxetine (CYMBALTA) 60 MG capsule Take by mouth 2 (two) times daily.     ? esomeprazole (NEXIUM) 40 MG capsule TAKE 1 CAPSULE BY MOUTH TWO TIMES A DAY BEFORE MEALS. Please keep your appointment on 06-09-21 180 capsule 0  ? fluticasone (FLONASE) 50 MCG/ACT nasal spray PLACE 1 SPRAY INTO BOTH NOSTRILS DAILY AS NEEDED FOR ALLERGIES OR RHINITIS 16 mL 2  ? lubiprostone (AMITIZA) 8 MCG capsule TAKE 1 CAPSULE (8 MCG TOTAL) BY MOUTH 2 (TWO) TIMES DAILY WITH A MEAL. 60 capsule 3  ? sucralfate (CARAFATE) 1 g tablet Take 1 tablet (1 g total) by mouth every 6 (six) hours as needed. Slowly dissolve 1 tablet in 1 Tablespoon of distilled water prior to taking 60 tablet 1  ? traZODone (DESYREL) 50 MG tablet Take 100 mg by mouth at bedtime.    ? valACYclovir (VALTREX) 500 MG tablet Take 500 mg by mouth 2 (two) times daily.    ? ?Current Facility-Administered Medications  ?Medication Dose Route Frequency Provider Last Rate Last Admin  ? betamethasone acetate-betamethasone sodium phosphate (CELESTONE) injection  12 mg  12 mg Other Once Magnus Sinning, MD      ? methylPREDNISolone acetate (DEPO-MEDROL) injection 80 mg  80 mg Other Once Magnus Sinning, MD      ? ? ?Medication Side Effects: none ? ?Orders placed

## 2021-06-08 NOTE — Therapy (Incomplete)
?OUTPATIENT PHYSICAL THERAPY TREATMENT NOTE ? ? ?Patient Name: Jordan [name] ?MRN: [id number] ?DOB:15-Dec-1960, 61 y.o., female ?Today's Date: 06/08/2021 ? ?PCP: Isaac Bliss, Rayford Halsted, MD ?REFERRING PROVIDER: Mcarthur Rossetti* ? ? ? ? ? ?Past Medical History:  ?Diagnosis Date  ? Acute upper respiratory infections of unspecified site   ? ADD (attention deficit disorder)   ? Allergic rhinitis due to pollen   ? Anal fissure   ? Anemia, unspecified   ? Anxiety disorder   ? Asthma   ? Chicken pox   ? Complication of anesthesia   ? wakes up slow  ? Depressive disorder, not elsewhere classified   ? Diffuse cystic mastopathy   ? Falls   ? Fibromyalgia   ? GERD (gastroesophageal reflux disease)   ? Headache(784.0)   ? History of anorexia nervosa   ? History of bulimia   ? IBS (irritable bowel syndrome)   ? 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 Lump or mass in breast   ? Meningitis, unspecified(322.9)   ? Oral thrush   ? Other specified glaucoma   ? Perforation of tympanic membrane, unspecified   ? Radial styloid tenosynovitis   ? Seizures ([place])   ? ?Past Surgical History:  ?Procedure Laterality Date  ? ADENOIDECTOMY    ? ANKLE SURGERY  1992  ? right ankle - scar tissue  ? DILATION AND CURETTAGE OF UTERUS  2006  ? History of Menometrorrhagia  ? [place] to 1986  ? 6 surgeries on right ear  ? LAPAROSCOPY  1993  ? normal  ? REDUCTION MAMMAPLASTY Bilateral 2018  ? uterine ablation  2011  ? WRIST SURGERY  2001  ? for de-quervaine - bilateral wrists - tendonitis  ? ?Patient Active Problem List  ? Diagnosis Date Noted  ? IGT (impaired glucose tolerance) 03/24/2021  ? Iron deficiency anemia 03/24/2021  ? Hyperlipidemia 03/24/2021  ? Low back pain 10/19/2020  ? Severe recurrent major depression without psychotic features ([place]) 09/27/2015  ?  Class: Chronic  ? Fibromyalgia 04/03/2013  ? Back pain, lumbosacral 04/03/2013  ? Degeneration of cervical intervertebral disc 04/03/2013  ? Dyspepsia and disorder of  function of stomach 03/16/2012  ? Routine health maintenance 01/03/2012  ? Mild cognitive impairment with memory loss 01/03/2012  ? Cough 07/03/2011  ? Bilateral dry eyes 05/13/2011  ? Back pain, thoracic 06/15/2010  ? MULTIPLE SCLEROSIS, PROGRESSIVE/RELAPSING 06/02/2009  ? Incontinence without sensory awareness 06/01/2009  ? NASAL POLYP 05/14/2009  ? Panic disorder without agoraphobia 04/25/2007  ? Carpal tunnel syndrome 04/25/2007  ? NECK PAIN, CHRONIC 02/19/2007  ? HAY FEVER 02/18/2007  ? ANEMIA-NOS 12/05/2006  ? Depression, recurrent ([place]) 12/05/2006  ? GLAUCOMA NEC 12/05/2006  ? TYMPANIC MEMBRANE PERFORATION 12/05/2006  ? FIBROCYSTIC BREAST DISEASE 12/05/2006  ? DE QUERVAIN'S TENOSYNOVITIS 12/05/2006  ? Headache 12/05/2006  ? BULIMIA, HX OF 12/05/2006  ? ANOREXIA, HX OF 12/05/2006  ? ? ?REFERRING DIAG: M54.50,G89.29 (ICD-10-CM) - Chronic left-sided low back pain without sciatica M25.552 (ICD-10-CM) - Pain in left hip  ? ?THERAPY DIAG:  ?No diagnosis found. ? ?PERTINENT HISTORY: ADD, anxiety, asthma, depression, fibromyalgia, meningitis, seizures ? ?PRECAUTIONS: none ? ?SUBJECTIVE: *** a little sore after last session but not bad; felt she was lazy last week and has been overly exhausted. Is working on [employer] as able.  Pain still present but "not as bad." ? ?PAIN:  ?Are you having pain? Yes ?NPRS scale: 3/10, up to 7/10 ?Location: Rt > Lt low  back/hip ?Alleviating: medication, stretching, rest, heat ?Aggravating: yardwork ? ? ?OBJECTIVE:  ?  ?DIAGNOSTIC FINDINGS:  ?MRI: Lt hip labral tear ?  ?PATIENT SURVEYS:  ?04/18/21 FOTO 46 (predicted 48) ?05/18/21 FOTO 51 ?  ?LUMBARAROM/PROM ?  ?A/PROM A/PROM  ?04/18/2021  ?Flexion WNL  ?Extension WNL with pain  ?Right lateral flexion WNL  ?Left lateral flexion WNL with pain on LT  ?Right rotation WNL  ?Left rotation WNL  ? (Blank rows = not tested) ?  ?  ?LE MMT: ?  ?MMT Right ?04/18/2021 Left ?04/18/2021  ?Hip flexion 5/5 5/5  ?Hip extension 4/5 3+/5  ?Hip abduction 4/5 3+/5   ?Hip adduction 4/5 3+/5  ?Hip internal rotation 4/5 4/5  ?Hip external rotation 3+/5 3+/5  ? (Blank rows = not tested) ?  ?LUMBAR SPECIAL TESTS:  ?Deferred today ?  ?  ?GAIT: ?                    independent ?  ?  ?TODAY'S TREATMENT  ?06/08/21 ?*** ? ?05/18/21 ?Therex: ?     Aerobic: NuStep L6 x 8 min ?     Supine: ?SKTC 3x30 sec bil ?Piriformis stretch 3x30 sec bil ?Mini Crunch 2x10 ?Bridges 20 reps x 5 sec hold ?    Sidelying: ?Clamshell x 20 reps bil ? ?05/04/21 ?Therex: ?     Aerobic: NuStep L6 x 6 min ?     Supine: ?Bridges 10 reps x 5 sec hold ?Bridges with march x 10 reps ?Mini Crunch 2x10 ?Tabletop alternating hip extension x 10 reps bil ?     Prone:  ?Plank 30 sec (1st rep up on elbows, 2nd/3rd reps on knees/elbows ?     Standing: ?Walking lunges 10' x 4 ?Lateral lunges x 10 reps bil ?Squats 2x10 reps  ?RDL holding 5# DB 2x10 ? ?04/18/21:  See HEP, performed trial reps PRN and reviewed exercises with pt ?  ?  ?PATIENT EDUCATION:  ?Education details: HEP ?Person educated: Patient ?Education method: Explanation, Demonstration, and Handouts ?Education comprehension: verbalized understanding, returned demonstration, and needs further education ?  ?  ?HOME EXERCISE PROGRAM: ?Access Code: [email or web address] ?URL: [email or web address] ?Date: 04/18/2021 ?Prepared by: Faustino Congress ?  ?Exercises ?Supine Bridge - 2 x daily - 7 x weekly - 1 sets - 10 reps - 5 sec hold ?Marching Bridge - 1 x daily - 7 x weekly - 3 sets - 10 reps ?Ab Prep - 1 x daily - 7 x weekly - 3 sets - 10 reps ?Bilateral Bent Leg Lift - 1 x daily - 7 x weekly - 3 sets - 10 reps ?Standard Plank - 1 x daily - 7 x weekly - 3 sets - 10 reps ?Standard Lunge - 1 x daily - 7 x weekly - 3 sets - 10 reps ?Lateral Lunge - 1 x daily - 7 x weekly - 3 sets - 10 reps ?Squat - 1 x daily - 7 x weekly - 3 sets - 10 reps ?Kettlebell Deadlift - 1 x daily - 7 x weekly - 3 sets - 10 reps ?  ?  ?ASSESSMENT: ?  ?CLINICAL IMPRESSION: ?*** ?Pt tolerated session well  and has surpassed FOTO goal at this time.  Overall improving with PT.   ?  ?OBJECTIVE IMPAIRMENTS decreased mobility, decreased strength, increased fascial restrictions, increased muscle spasms, and pain.  ?  ?ACTIVITY LIMITATIONS cleaning, community activity, driving, occupation, laundry, and yard work.  ?  ?PERSONAL FACTORS Past/current experiences and 3+ comorbidities: ADD, anxiety,  asthma, depression, fibromyalgia, meningitis, seizures  are also affecting patient's functional outcome.  ?  ?  ?REHAB POTENTIAL: Excellent ?  ?CLINICAL DECISION MAKING: Stable/uncomplicated ?  ?EVALUATION COMPLEXITY: Low ?  ?  ?GOALS: ?Goals reviewed with patient? Yes ?  ?SHORT TERM GOALS: ?  ?STG Name Target Date Goal status  ?1 Independent with initial HEP ?Baseline:  05/09/2021 MET ?05/04/21  ?  ?LONG TERM GOALS:  ?  ?LTG Name Target Date Goal status  ?1 Independent with final HEP ?Baseline: 05/30/2021 INITIAL  ?2 FOTO score improved to 48 for improved function ?Baseline: 05/30/2021 MET ?05/18/21  ?3 Lt hip strength improved to 4/5 for improved function ?Baseline: 05/30/2021 INITIAL  ?4 Report pain < 5/10 with activity and at highest for improved function  ?Baseline: 05/30/2021 INITIAL  ?  ?  ?  ?PLAN: ?PT FREQUENCY: 1x/week ?  ?PT DURATION: 6 weeks ?  ?PLANNED INTERVENTIONS: Therapeutic exercises, Therapeutic activity, Neuro Muscular re-education, Patient/Family education, Joint mobilization, Stair training, Dry Needling, Electrical stimulation, Cryotherapy, Moist heat, Taping, Traction, and Manual therapy ?  ?PLAN FOR NEXT SESSION: *** continue with gym exercises/hip strengthening   ? ? ? ?Faustino Congress, PT ?06/08/2021, 1:33 PM ? ?  ? ?

## 2021-06-09 ENCOUNTER — Ambulatory Visit: Payer: Medicare Other | Admitting: Gastroenterology

## 2021-06-09 ENCOUNTER — Encounter: Payer: Self-pay | Admitting: Internal Medicine

## 2021-06-15 ENCOUNTER — Encounter: Payer: Medicare Other | Admitting: Physical Therapy

## 2021-06-16 ENCOUNTER — Ambulatory Visit: Payer: Medicare Other | Admitting: Psychology

## 2021-06-16 DIAGNOSIS — F331 Major depressive disorder, recurrent, moderate: Secondary | ICD-10-CM

## 2021-06-20 ENCOUNTER — Ambulatory Visit (INDEPENDENT_AMBULATORY_CARE_PROVIDER_SITE_OTHER): Payer: Medicare Other | Admitting: Psychology

## 2021-06-20 ENCOUNTER — Ambulatory Visit: Payer: Medicare Other | Admitting: Psychology

## 2021-06-20 DIAGNOSIS — F331 Major depressive disorder, recurrent, moderate: Secondary | ICD-10-CM

## 2021-06-20 NOTE — Progress Notes (Signed)
[facility] Counselor/Therapist Progress Note ? ?Patient ID: Jordan [name], MRN: [id number],   ? ?Date: 06/20/2021 ? ?Time Spent: 3:00pm-3:55pm   55 minutes  ? ?Treatment Type: Individual Therapy ? ?Reported Symptoms: sadness, stress ? ?Mental Status Exam: ?Appearance:  Casual     ?Behavior: Appropriate  ?Motor: Normal  ?Speech/Language:  Normal Rate  ?Affect: Appropriate  ?Mood: normal  ?Thought process: normal  ?Thought content:   WNL  ?Sensory/Perceptual disturbances:   WNL  ?Orientation: oriented to person, place, time/date, and situation  ?Attention: Good  ?Concentration: Good  ?Memory: WNL  ?Fund of knowledge:  Good  ?Insight:   Good  ?Judgment:  Good  ?Impulse Control: Good  ? ?Risk Assessment: ?Danger to Self:  No ?Self-injurious Behavior: No ?Danger to Others: No ?Duty to Warn:no ?Physical Aggression / Violence:No  ?Access to Firearms a concern: No  ?Gang Involvement:No  ? ?Subjective:  ?Pt present for face-to-face individual therapy via video Webex.  Pt consents to telehealth video session due to COVID 19 pandemic. ?Location of pt: home ?Location of therapist: home office.   ?Pt talked about planning a beach trip with her dog for next week.   ?Pt saw a psychiatric provider at [place].   She was prescribed Busperone.  Pt is very happy with her new provider.   ?He felt pt has more anxiety than she was treated for.  Pt's anxiety is triggered by worrying about Tarry Kos and by "what if" thoughts. Worked on worry and anxiety management.   ?Pt still thinks quite a bit about anticipating the loss of her dog Sunny.  Pt is going to have photography session of her and Tarry Kos at [facility].   ?Pt talked about going to [place] for the whole day on Saturday and had someone stay with Tarry Kos and her cat Lyndon.   ?She visited her best friend while she was there and enjoyed their time together. ?Pt has her 61st birthday soon.   She never thought she would be single and in her childhood  home when she was 17.   She was hoping she would have a husband and live in a more urban setting.     Helped pt process her thoughts and feelings.  ?Addressed pt's loneliness.  Addressed how pt can increase social connections.  She is thinking about having a party at her house and will invite neighbors and friends.   ?Pt talked about sleeping too late.  She is going to bed at about 9:30pm - 10:30pm and gets up at about 10:00am.  She sleeps that whole time.  She takes Trazadone and melatonin.  She will talk with her psychiatric provider about possibly adjusting the Trazadone so she doesn't sleep so much.   ?Pt talked about work at [employer].   Pt is beginning to put her former career behind her and is embracing the present job. Her employer is like a family which is good for pt.   ?Pt talked about her hip.  She continues to have pain so she is going to get an MRI to assess the problem.  Addressed pt's health concerns.   ?Worked on increasing self care.   ?Provided supportive therapy. ? ? ?Interventions: Cognitive Behavioral Therapy and Insight-Oriented ? ?Diagnosis:F33.1 ? ?Plan:  ?Plan of Care: Recommend ongoing therapy.  Pt participated in setting treatment goals.  She wants to improve coping skills and self esteem.   ?Plan to meet every couple of weeks.   ? ?Treatment Plan (Treatment Plan Target  Date:  03/24/2022) ?Client Abilities/Strengths  ?Pt is bright, engaging, and motivated for therapy.   ?Client Treatment Preferences  ?Individual therapy.  ?Client Statement of Needs  ?Improve coping skills.  ?Symptoms  ?Depressed or irritable mood. ?Feelings of hopelessness, worthlessness, or inappropriate guilt. ?Low self-esteem. ?Unresolved grief issues. ? ? ?Problems Addressed  ?Unipolar Depression ?Goals ?1. Alleviate depressive symptoms and return to previous level of effective functioning. ?2. Appropriately grieve the loss in order to normalize mood and to return to previously adaptive level of  functioning. ?Objective ?Learn and implement behavioral strategies to overcome depression. ?Target Date: 2022-03-24 Frequency: Biweekly  ?Progress: 10 Modality: individual  ?Related Interventions ?Engage the client in "behavioral activation," increasing his/her activity level and contact with sources of reward, while identifying processes that inhibit activation.  Use behavioral techniques such as instruction, rehearsal, role-playing, role reversal, as needed, to facilitate activity in the client's daily life; reinforce success. ?Assist the client in developing skills that increase the likelihood of deriving pleasure from behavioral activation (e.g., assertiveness skills, developing an exercise plan, less internal/more external focus, increased social involvement); reinforce success. ?Objective ?Identify important people in life, past and present, and describe the quality, good and poor, of those relationships. ?Target Date: 2022-03-24 Frequency: Biweekly  ?Progress: 10 Modality: individual  ?Related Interventions ?Conduct Interpersonal Therapy beginning with the assessment of the client's "interpersonal inventory" of important past and present relationships; develop a case formulation linking depression to grief, interpersonal role disputes, role transitions, and/or interpersonal deficits). ?Objective ?Learn and implement problem-solving and decision-making skills. ?Target Date: 2022-03-24 Frequency: Biweekly  ?Progress: 10 Modality: individual  ?Related Interventions ?Conduct Problem-Solving Therapy using techniques such as psychoeducation, modeling, and role-playing to teach client problem-solving skills (i.e., defining a problem specifically, generating possible solutions, evaluating the pros and cons of each solution, selecting and implementing a plan of action, evaluating the efficacy of the plan, accepting or revising the plan); role-play application of the problem-solving skill to a real life  issue. ?Encourage in the client the development of a positive problem orientation in which problems and solving them are viewed as a natural part of life and not something to be feared, despaired, or avoided. ?3. Develop healthy interpersonal relationships that lead to the alleviation and help prevent the relapse of depression. ?4. Develop healthy thinking patterns and beliefs about self, others, and the world that lead to the alleviation and help prevent the relapse of depression. ?5. Recognize, accept, and cope with feelings of depression. ?Diagnosis ?F33.1  ?Conditions For Discharge ?Achievement of treatment goals and objectives  ? ? ? ?Ngina Royer, LCSW ? ? ?

## 2021-06-21 ENCOUNTER — Telehealth: Payer: Self-pay | Admitting: Orthopaedic Surgery

## 2021-06-21 ENCOUNTER — Encounter: Payer: Self-pay | Admitting: Orthopaedic Surgery

## 2021-06-21 ENCOUNTER — Other Ambulatory Visit: Payer: Self-pay | Admitting: Orthopaedic Surgery

## 2021-06-21 DIAGNOSIS — W57XXXA Bitten or stung by nonvenomous insect and other nonvenomous arthropods, initial encounter: Secondary | ICD-10-CM | POA: Diagnosis not present

## 2021-06-21 DIAGNOSIS — S40262A Insect bite (nonvenomous) of left shoulder, initial encounter: Secondary | ICD-10-CM | POA: Diagnosis not present

## 2021-06-21 MED ORDER — MELOXICAM 15 MG PO TABS: 15.0000 mg | ORAL_TABLET | Freq: Every day | ORAL | 1 refills | Status: DC

## 2021-06-21 NOTE — Telephone Encounter (Signed)
Called pt to sch follow up appt for pt with Dr. Ninfa Linden. Per pt's mychart message "On 5/22 I have an appt at 1:20 with another doc.  Not available 5/16 11-12pm. Not available 5/24 11-12.  Not available 5/18" ?

## 2021-06-22 ENCOUNTER — Encounter: Payer: Self-pay | Admitting: Internal Medicine

## 2021-06-23 ENCOUNTER — Ambulatory Visit
Admission: RE | Admit: 2021-06-23 | Discharge: 2021-06-23 | Disposition: A | Discharge status: Home / Self Care | Payer: Medicare Other | Source: Ambulatory Visit | Attending: Orthopaedic Surgery | Admitting: Orthopaedic Surgery

## 2021-06-23 ENCOUNTER — Ambulatory Visit: Payer: Medicare Other | Admitting: Psychology

## 2021-06-23 DIAGNOSIS — K5730 Diverticulosis of large intestine without perforation or abscess without bleeding: Secondary | ICD-10-CM | POA: Diagnosis not present

## 2021-06-23 DIAGNOSIS — M160 Bilateral primary osteoarthritis of hip: Secondary | ICD-10-CM | POA: Diagnosis not present

## 2021-06-23 DIAGNOSIS — S76312A Strain of muscle, fascia and tendon of the posterior muscle group at thigh level, left thigh, initial encounter: Secondary | ICD-10-CM | POA: Diagnosis not present

## 2021-06-23 DIAGNOSIS — M25852 Other specified joint disorders, left hip: Secondary | ICD-10-CM | POA: Diagnosis not present

## 2021-06-23 MED ORDER — GADOBENATE DIMEGLUMINE 529 MG/ML IV SOLN
13.0000 mL | Freq: Once | INTRAVENOUS | Status: AC | PRN
  Administered 2021-06-23: 13 mL via INTRAVENOUS

## 2021-06-24 ENCOUNTER — Encounter: Payer: Self-pay | Admitting: Family Medicine

## 2021-06-24 ENCOUNTER — Ambulatory Visit (INDEPENDENT_AMBULATORY_CARE_PROVIDER_SITE_OTHER): Payer: Medicare Other | Admitting: Family Medicine

## 2021-06-24 VITALS — BP 129/77 | HR 82 | Temp 98.6°F | Wt 143.4 lb

## 2021-06-24 DIAGNOSIS — R21 Rash and other nonspecific skin eruption: Secondary | ICD-10-CM | POA: Diagnosis not present

## 2021-06-24 DIAGNOSIS — W57XXXA Bitten or stung by nonvenomous insect and other nonvenomous arthropods, initial encounter: Secondary | ICD-10-CM | POA: Diagnosis not present

## 2021-06-24 DIAGNOSIS — S30860A Insect bite (nonvenomous) of lower back and pelvis, initial encounter: Secondary | ICD-10-CM | POA: Diagnosis not present

## 2021-06-24 DIAGNOSIS — B351 Tinea unguium: Secondary | ICD-10-CM | POA: Diagnosis not present

## 2021-06-24 MED ORDER — DOXYCYCLINE HYCLATE 100 MG PO TABS: 200.0000 mg | ORAL_TABLET | Freq: Once | ORAL | 0 refills | Status: AC

## 2021-06-24 NOTE — Progress Notes (Signed)
Subjective:  ? ? Patient ID: Jordan [name], female    DOB: 09/13/1960, 61 y.o.   MRN: [id number] ? ?Chief Complaint  ?Patient presents with  ? Rash  ?  Rash spots on back after cutting down a big tree. Has gotten better but says they are open. Also had a tick that she pulled off of er back, spot is still red. ?Nail fungus  ? ? ?HPI ?Patient was seen today for acute concern.  Pt endorses tick bite on left lower back and several other lesions on upper back after cutting a large bush on Tuesday.  Pt noticed the tick on that Tuesday evening and was able to remove it completely.  The areas on upper back started as pustules, are pruritic, but feel better today.  Pt went to [facility] to inquire about the rash.  Tried OTC anti itch spray.  Patient notes previous history of meningitis after tick bite over 30 years ago. ? ?Pt endorses finger nail fungus on b/l thumbs.  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  Notes some improvement in nails with OTC antifungal medications and trimming regularly. ? ?Past Medical History:  ?Diagnosis Date  ? Acute upper respiratory infections of unspecified site   ? ADD (attention deficit disorder)   ? Allergic rhinitis due to pollen   ? Anal fissure   ? Anemia, unspecified   ? Anxiety disorder   ? Asthma   ? Chicken pox   ? Complication of anesthesia   ? wakes up slow  ? Depressive disorder, not elsewhere classified   ? Diffuse cystic mastopathy   ? Falls   ? Fibromyalgia   ? GERD (gastroesophageal reflux disease)   ? Headache(784.0)   ? History of anorexia nervosa   ? History of bulimia   ? IBS (irritable bowel syndrome)   ? 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 Lump or mass in breast   ? Meningitis, unspecified(322.9)   ? Oral thrush   ? Other specified glaucoma   ? Perforation of tympanic membrane, unspecified   ? Radial styloid tenosynovitis   ? Seizures ([place])   ? ? ?Allergies  ?Allergen Reactions  ? Prednisone Rash  ?  Face redness and swollen  per patient  ? Pregabalin Other (See Comments)  ?  Delusions  ? Remeron [Mirtazapine] Other (See Comments)  ?  Severe hallucinations  ? ? ?ROS ?General: Denies fever, chills, night sweats, changes in weight, changes in appetite ?HEENT: Denies headaches, ear pain, changes in vision, rhinorrhea, sore throat ?CV: Denies CP, palpitations, SOB, orthopnea ?Pulm: Denies SOB, cough, wheezing ?GI: Denies abdominal pain, nausea, vomiting, diarrhea, constipation ?GU: Denies dysuria, hematuria, frequency, vaginal discharge ?Msk: Denies muscle cramps, joint pains ?Neuro: Denies weakness, numbness, tingling ?Skin: Denies rashes, bruising + nail fungus, pruritic rash, tick bite ?Psych: Denies depression, anxiety, hallucinations ? ?   ?Objective:  ?  ?Blood pressure 129/77, pulse 82, temperature 98.6 ?F (37 ?C), temperature source Oral, weight 143 lb 6.4 oz (65 kg), SpO2 100 %. ? ?Gen. Pleasant, well-nourished, in no distress, normal affect   ?HEENT: [place]/AT, face symmetric, conjunctiva clear, no scleral icterus, PERRLA, EOMI, nares patent without drainage ?Lungs: no accessory muscle use ?Cardiovascular: RRR,  no peripheral edema ?Musculoskeletal: No deformities, no cyanosis or clubbing, normal tone ?Neuro:  A&Ox3, CN II-XII intact, normal gait ?Skin:  Warm, dry, intact.  Left lateral lower back/flank with a small dried lesion, 41m and surrounding erythema,  no increased warmth or induration.  Several similar dried lesions on upper left back/posterior shoulder.  No drainage noted. ? ? ?Wt Readings from Last 3 Encounters:  ?06/24/21 143 lb 6.4 oz (65 kg)  ?04/19/21 146 lb (66.2 kg)  ?04/05/21 148 lb 9.6 oz (67.4 kg)  ? ? ?Lab Results  ?Component Value Date  ? WBC 6.6 03/21/2021  ? HGB 11.1 (L) 03/21/2021  ? HCT 34.9 (L) 03/21/2021  ? PLT 213.0 03/21/2021  ? GLUCOSE 93 03/21/2021  ? CHOL 193 03/21/2021  ? TRIG 202.0 (H) 03/21/2021  ? HDL 48.20 03/21/2021  ? LDLDIRECT 119.0 03/21/2021  ? [place] 99 04/21/2020  ? ALT 20 03/21/2021  ? AST  20 03/21/2021  ? NA 143 03/21/2021  ? K 4.3 03/21/2021  ? CL 107 03/21/2021  ? CREATININE 1.06 03/21/2021  ? BUN 18 03/21/2021  ? CO2 25 03/21/2021  ? TSH 1.30 04/21/2020  ? INR 1.1 07/22/2007  ? HGBA1C 6.1 03/21/2021  ? ? ?Assessment/Plan: ? ?Tick bite of lower back, initial encounter  ?-Tick noted on left lower back/flank and removed completely. ?-We will start Lyme disease ppx dose of doxycycline ?-Discussed r/b/a of doxycycline.  Patient advised to limit sun exposure. ?-Given strict precautions ?- Plan: doxycycline (VIBRA-TABS) 100 MG tablet ? ?Rash ?-Several scattered dried vesicular lesions. ?-Discussed likely insect bites.  Also consider shingles-though less likely as no new crops or waves of lesions in varying stages and no burning, tingling, or pain noted. ?-Discussed supportive care including antihistamine creams or p.o. antihistamine.  Can also use cortisone cream 1% OTC. ?-Discussed avoid scratching to prevent secondary bacterial infection ?-Given precautions ? ?Nail fungus ?-Improving ?-Patient encouraged to continue OTC antifungal treatment options. ?-Advised on likely duration of treatment 6 months or more. ?-Discussed r/b/a of p.o. antifungal medication.  Patient declines at this time. ?-Also discussed laser treatment with podiatry or dermatology. ?-Patient to continue OTC products.  Follow-up as needed ? ?F/u as needed ? ?Grier Mitts, MD ?

## 2021-07-04 ENCOUNTER — Other Ambulatory Visit: Payer: Self-pay | Admitting: Gastroenterology

## 2021-07-04 ENCOUNTER — Ambulatory Visit (INDEPENDENT_AMBULATORY_CARE_PROVIDER_SITE_OTHER): Payer: Medicare Other | Admitting: Orthopaedic Surgery

## 2021-07-04 ENCOUNTER — Encounter: Payer: Self-pay | Admitting: Orthopaedic Surgery

## 2021-07-04 DIAGNOSIS — M25552 Pain in left hip: Secondary | ICD-10-CM

## 2021-07-04 NOTE — Progress Notes (Signed)
The patient comes in today to go over the MRI of her pelvis.  This really looked at both hips.  Most of her pain seems to be in the left side of her lower back.  She has had some groin pain.  She said about 2 months ago she had an intra-articular injection in the left hip and that is helped.  She does take meloxicam but not on daily basis.  She is active and has lost weight.  She is walking without assistive device and no limp. ? ?On my exam both hips move smoothly and fluidly with really no pain in the groin today at all. ? ?The MRI of her pelvis does show some mild arthritic changes of both hips but this is only mild there is no significant cartilage wear at all.  There are some degenerative changes of the anterior labrum on both sides and some slight tearing but really nothing that points to the main source of her pain or something that would have an operative intervention that would be indicated. ? ?She agrees with this treatment plan and does not really want surgery.  I am not opposed to having another left hip intra-articular injection sometime around August under fluoroscopy by Dr. Ernestina Patches if it is bothering her.  All questions and concerns were answered and addressed.  Follow-up is as needed. ?

## 2021-07-05 ENCOUNTER — Encounter: Payer: Self-pay | Admitting: Behavioral Health

## 2021-07-05 ENCOUNTER — Ambulatory Visit: Payer: Medicare Other | Admitting: Behavioral Health

## 2021-07-05 DIAGNOSIS — F900 Attention-deficit hyperactivity disorder, predominantly inattentive type: Secondary | ICD-10-CM

## 2021-07-05 DIAGNOSIS — F331 Major depressive disorder, recurrent, moderate: Secondary | ICD-10-CM

## 2021-07-05 DIAGNOSIS — F411 Generalized anxiety disorder: Secondary | ICD-10-CM

## 2021-07-05 MED ORDER — BUSPIRONE HCL 15 MG PO TABS: ORAL_TABLET | ORAL | 2 refills | Status: DC

## 2021-07-05 MED ORDER — TRAZODONE HCL 50 MG PO TABS: 100.0000 mg | ORAL_TABLET | Freq: Every day | ORAL | 2 refills | Status: DC

## 2021-07-05 MED ORDER — DULOXETINE HCL 60 MG PO CPEP: 60.0000 mg | ORAL_CAPSULE | Freq: Two times a day (BID) | ORAL | 2 refills | Status: DC

## 2021-07-05 MED ORDER — AMPHETAMINE-DEXTROAMPHET ER 30 MG PO CP24: 30.0000 mg | ORAL_CAPSULE | Freq: Every day | ORAL | 0 refills | Status: DC

## 2021-07-05 MED ORDER — AMPHETAMINE-DEXTROAMPHETAMINE 20 MG PO TABS: 20.0000 mg | ORAL_TABLET | Freq: Every day | ORAL | 0 refills | Status: DC

## 2021-07-05 NOTE — Progress Notes (Signed)
[facility] ? ?Patient ID: [name] [name],  ?MRN: [id number] ? ?PCP: Isaac Bliss, Rayford Halsted, MD ? ?Date of Evaluation: 07/05/2021 ?Time spent:30 minutes ? ?Chief Complaint:  ? ?HISTORY/CURRENT STATUS: ?HPI ? ?61 year old patient presents to this office for follow up and medication management. Says that she has good days and bad days but is primarily dealing with the possible loss of her aging pet who is 42 years old. She would like to consider increase of  Buspar for anxiety. She does not want to make any other changes at this time. She did say she stopped the Rexulti because she did not like the way it made her feel. 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 She denies any current or prior mania.No psychosis, no auditory or visual hallucinations. Strong family hx of dementia. No SI or HI. ?  ?Prior psychiatric medication trials: ?  ?Abilify ?Latuda ?Risperdone ?Amitriptyline ?Trintellix ?Lexapro ?Prozac ?Zoloft ?Paxil ?Wellbutrin ?Remeron ?Effexor ?Cymbalta ?Rexulti ?  ? ? ? ? ? ?Individual Medical History/ Review of Systems: Changes? :No  ? ?Allergies: Prednisone, Pregabalin, and Remeron [mirtazapine] ? ?Current Medications:  ?Current Outpatient Medications:  ?  amphetamine-dextroamphetamine (ADDERALL XR) 30 MG 24 hr capsule, Take 1 capsule (30 mg total) by mouth daily., Disp: 30 capsule, Rfl: 0 ?  amphetamine-dextroamphetamine (ADDERALL) 20 MG tablet, Take 1 tablet (20 mg total) by mouth daily., Disp: 30 tablet, Rfl: 0 ?  ALPRAZolam (XANAX) 0.5 MG tablet, Take 0.5 mg by mouth at bedtime as needed. For sleep.  Also 1/2 tab during the day., Disp: , Rfl:  ?  amphetamine-dextroamphetamine (ADDERALL XR) 15 MG 24 hr capsule, Take by mouth., Disp: , Rfl:  ?  amphetamine-dextroamphetamine (ADDERALL XR) 30 MG 24 hr capsule, Take 30 mg by mouth every morning. Pt reported 20 mg, Disp: , Rfl:  ?  busPIRone (BUSPAR) 15 MG tablet, Take 3 tablets  three times daily, at least 7-8 hours between doses (45 mg total daily)., Disp: 30 tablet, Rfl: 2 ?  cyclobenzaprine (FLEXERIL) 10 MG tablet, TAKE ONE TABLET BY MOUTH EVERY NIGHT AT BEDTIME AS NEEDED FOR MUSCLE SPASMS, Disp: 30 tablet, Rfl: 2 ?  dicyclomine (BENTYL) 10 MG capsule, TAKE 1 CAPSULE BY MOUTH EVERY 8 HOURS AS NEEDED FOR SPASMS, Disp: 60 capsule, Rfl: 2 ?  DULoxetine (CYMBALTA) 60 MG capsule, Take 1 capsule (60 mg total) by mouth 2 (two) times daily., Disp: 270 capsule, Rfl: 2 ?  esomeprazole (NEXIUM) 40 MG capsule, TAKE 1 CAPSULE BY MOUTH TWO TIMES A DAY BEFORE MEALS. Please keep your appointment on 06-09-21, Disp: 180 capsule, Rfl: 0 ?  fluticasone (FLONASE) 50 MCG/ACT nasal spray, PLACE 1 SPRAY INTO BOTH NOSTRILS DAILY AS NEEDED FOR ALLERGIES OR RHINITIS, Disp: 16 mL, Rfl: 2 ?  lubiprostone (AMITIZA) 8 MCG capsule, TAKE 1 CAPSULE (8 MCG TOTAL) BY MOUTH 2 (TWO) TIMES DAILY WITH A MEAL., Disp: 60 capsule, Rfl: 3 ?  meloxicam (MOBIC) 15 MG tablet, Take 1 tablet (15 mg total) by mouth daily., Disp: 30 tablet, Rfl: 1 ?  sucralfate (CARAFATE) 1 g tablet, Take 1 tablet (1 g total) by mouth every 6 (six) hours as needed. Slowly dissolve 1 tablet in 1 Tablespoon of distilled water prior to taking, Disp: 60 tablet, Rfl: 1 ?  traZODone (DESYREL) 50 MG tablet, Take 2 tablets (100 mg total) by mouth at bedtime., Disp: 90 tablet, Rfl: 2 ?  valACYclovir (VALTREX) 500 MG tablet,  Take 500 mg by mouth 2 (two) times daily., Disp: , Rfl:  ? ?Current Facility-Administered Medications:  ?  betamethasone acetate-betamethasone sodium phosphate (CELESTONE) injection 12 mg, 12 mg, Other, Once, Magnus Sinning, MD ?  methylPREDNISolone acetate (DEPO-MEDROL) injection 80 mg, 80 mg, Other, Once, Magnus Sinning, MD ?Medication Side Effects: none ? ?Family Medical/ Social History: Changes? No ? ?MENTAL HEALTH EXAM: ? ?There were no vitals taken for this visit.There is no height or weight on file to calculate BMI.  ?General  Appearance: Casual, Neat, and Well Groomed  ?Eye Contact:  Good  ?Speech:  Talkative  ?Volume:  Normal  ?Mood:  Anxious  ?Affect:  Appropriate, Congruent, and Anxious  ?Thought Process:  Coherent and Descriptions of Associations: Tangential  ?Orientation:  Full (Time, Place, and Person)  ?Thought Content: Logical   ?Suicidal Thoughts:  No  ?Homicidal Thoughts:  No  ?Memory:  WNL  ?Judgement:  Good  ?Insight:  Good  ?Psychomotor Activity:  Normal  ?Concentration:  Concentration: Good  ?Recall:  Good  ?Fund of Knowledge: Good  ?Language: Good  ?Assets:  Desire for Improvement  ?ADL's:  Intact  ?Cognition: WNL  ?Prognosis:  Good  ? ? ?DIAGNOSES:  ?  ICD-10-CM   ?1. Major depressive disorder, recurrent episode, moderate (HCC)  F33.1 DULoxetine (CYMBALTA) 60 MG capsule  ?  traZODone (DESYREL) 50 MG tablet  ?  ?2. Generalized anxiety disorder  F41.1 busPIRone (BUSPAR) 15 MG tablet  ?  DULoxetine (CYMBALTA) 60 MG capsule  ?  traZODone (DESYREL) 50 MG tablet  ?  ?3. Attention deficit hyperactivity disorder (ADHD), predominantly inattentive type  F90.0 amphetamine-dextroamphetamine (ADDERALL XR) 30 MG 24 hr capsule  ?  amphetamine-dextroamphetamine (ADDERALL) 20 MG tablet  ?  traZODone (DESYREL) 50 MG tablet  ?  ? ? ?Receiving Psychotherapy: yes, Clint Bolder ? ? ? ? ?RECOMMENDATIONS:  ? ?Greater than 50% of 60 face to face time with patient was spent on counseling and coordination of care. We discussed her very long and complex hx of anxiety, depression, and then ADHD in 2011. We discussed her prior medications and plan of care. Discussed her goals for treatment with this practice. ?We agreed to: ?Will continue Cymbalta 60 mg twice daily ?To continue Trazodone 50 mg at bedtime as reported by pt ?To continue Adderall 30 mg XR in the am and Adderall 30 mg IR in the afternoon as reported by pt she has been taking. ?Continue Buspar 3 tablets daily (45 mg total daily). At least 7-8 hours between doses. ?Patient stopped Rexulti  because she did not like the way it made her feel.  ?To report worsening symptoms or side effects promptly ?Will follow up in 4 weeks to reassess ?Provided emergency contact information ?Discussed potential metabolic side effects associated with atypical antipsychotics, as well as potential risk for movement side effects. Advised pt to contact office if movement side effects occur.   ?Reviewed PDMP ?  ? ? ? ? ? ? ? ? ?Elwanda Brooklyn, NP  ?

## 2021-07-05 NOTE — Addendum Note (Signed)
Addended by: Lesle Chris A on: 07/05/2021 11:51 AM ? ? Modules accepted: Orders ? ?

## 2021-07-06 ENCOUNTER — Ambulatory Visit: Payer: Medicare Other | Admitting: Behavioral Health

## 2021-07-06 ENCOUNTER — Encounter: Payer: Self-pay | Admitting: Orthopaedic Surgery

## 2021-07-08 ENCOUNTER — Ambulatory Visit (INDEPENDENT_AMBULATORY_CARE_PROVIDER_SITE_OTHER): Payer: Medicare Other | Admitting: Psychology

## 2021-07-08 DIAGNOSIS — F331 Major depressive disorder, recurrent, moderate: Secondary | ICD-10-CM

## 2021-07-08 NOTE — Progress Notes (Signed)
[facility] Counselor/Therapist Progress Note  Patient ID: Chastity Noland, MRN: [id number],    Date: 07/08/2021  Time Spent: 3:00pm-3:55pm   55 minutes   Treatment Type: Individual Therapy  Reported Symptoms: sadness, stress  Mental Status Exam: Appearance:  Casual     Behavior: Appropriate  Motor: Normal  Speech/Language:  Normal Rate  Affect: Appropriate  Mood: normal  Thought process: normal  Thought content:   WNL  Sensory/Perceptual disturbances:   WNL  Orientation: oriented to person, place, time/date, and situation  Attention: Good  Concentration: Good  Memory: WNL  Fund of knowledge:  Good  Insight:   Good  Judgment:  Good  Impulse Control: Good   Risk Assessment: Danger to Self:  No Self-injurious Behavior: No Danger to Others: No Duty to Warn:no Physical Aggression / Violence:No  Access to Firearms a concern: No  Gang Involvement:No   Subjective:  Pt present for face-to-face individual therapy via video Webex.  Pt consents to telehealth video session due to COVID 19 pandemic. Location of pt: home Location of therapist: home office.   Pt talked about her vacation.  She and her dog Tarry Kos went to [place].  They had a great time.  Pt was anxious about the trip before hand bc she was worried about how Tarry Kos would do.  Sunny ended up having a great time at the beach.  Pt states the trip helped pt not worry so much about the future regarding Sunny.    Pt talked about her work at [employer].  She likes it bc she gets out to be with people.  She may not be given many more hours to work there.  Addressed how pt can get her social needs met.   She has joined a gym and hope to meet people there.   Pt talked about her depression.   She has realized how depressed she has been.  She has had a temper at times.  She attended a city [employer] and got frustrated.  Addressed the issues and how pt reacted.  Worked on stress management and emotional  regulation.   Worked on increasing self care.   Provided supportive therapy.   Interventions: Cognitive Behavioral Therapy and Insight-Oriented  Diagnosis:F33.1  Plan:  Plan of Care: Recommend ongoing therapy.  Pt participated in setting treatment goals.  She wants to improve coping skills and self esteem.   Plan to meet every couple of weeks.    Treatment Plan (Treatment Plan Target Date:  03/24/2022) Client Abilities/Strengths  Pt is bright, engaging, and motivated for therapy.   Client Treatment Preferences  Individual therapy.  Client Statement of Needs  Improve coping skills.  Symptoms  Depressed or irritable mood. Feelings of hopelessness, worthlessness, or inappropriate guilt. Low self-esteem. Unresolved grief issues.   Problems Addressed  Unipolar Depression Goals 1. Alleviate depressive symptoms and return to previous level of effective functioning. 2. Appropriately grieve the loss in order to normalize mood and to return to previously adaptive level of functioning. Objective Learn and implement behavioral strategies to overcome depression. Target Date: 2022-03-24 Frequency: Biweekly  Progress: 10 Modality: individual  Related Interventions Engage the client in "behavioral activation," increasing his/her activity level and contact with sources of reward, while identifying processes that inhibit activation.  Use behavioral techniques such as instruction, rehearsal, role-playing, role reversal, as needed, to facilitate activity in the client's daily life; reinforce success. Assist the client in developing skills that increase the likelihood of deriving pleasure from behavioral  activation (e.g., assertiveness skills, developing an exercise plan, less internal/more external focus, increased social involvement); reinforce success. Objective Identify important people in life, past and present, and describe the quality, good and poor, of those relationships. Target Date:  2022-03-24 Frequency: Biweekly  Progress: 10 Modality: individual  Related Interventions Conduct Interpersonal Therapy beginning with the assessment of the client's "interpersonal inventory" of important past and present relationships; develop a case formulation linking depression to grief, interpersonal role disputes, role transitions, and/or interpersonal deficits). Objective Learn and implement problem-solving and decision-making skills. Target Date: 2022-03-24 Frequency: Biweekly  Progress: 10 Modality: individual  Related Interventions Conduct Problem-Solving Therapy using techniques such as psychoeducation, modeling, and role-playing to teach client problem-solving skills (i.e., defining a problem specifically, generating possible solutions, evaluating the pros and cons of each solution, selecting and implementing a plan of action, evaluating the efficacy of the plan, accepting or revising the plan); role-play application of the problem-solving skill to a real life issue. Encourage in the client the development of a positive problem orientation in which problems and solving them are viewed as a natural part of life and not something to be feared, despaired, or avoided. 3. Develop healthy interpersonal relationships that lead to the alleviation and help prevent the relapse of depression. 4. Develop healthy thinking patterns and beliefs about self, others, and the world that lead to the alleviation and help prevent the relapse of depression. 5. Recognize, accept, and cope with feelings of depression. Diagnosis F33.1  Conditions For Discharge Achievement of treatment goals and objectives      , LCSW   

## 2021-07-11 ENCOUNTER — Ambulatory Visit: Payer: Medicare Other | Admitting: Gastroenterology

## 2021-07-13 ENCOUNTER — Encounter: Payer: Self-pay | Admitting: Orthopaedic Surgery

## 2021-07-14 ENCOUNTER — Other Ambulatory Visit: Payer: Self-pay

## 2021-07-14 ENCOUNTER — Other Ambulatory Visit: Payer: Self-pay | Admitting: Physical Medicine and Rehabilitation

## 2021-07-14 DIAGNOSIS — M47816 Spondylosis without myelopathy or radiculopathy, lumbar region: Secondary | ICD-10-CM

## 2021-07-14 DIAGNOSIS — M5416 Radiculopathy, lumbar region: Secondary | ICD-10-CM

## 2021-07-14 NOTE — Telephone Encounter (Signed)
Is it ok to refer her to [facility]?

## 2021-07-19 ENCOUNTER — Encounter: Payer: Self-pay | Admitting: Internal Medicine

## 2021-07-19 NOTE — Telephone Encounter (Signed)
Any updates on her referral?

## 2021-07-20 ENCOUNTER — Telehealth: Payer: Self-pay | Admitting: Internal Medicine

## 2021-07-20 ENCOUNTER — Encounter (HOSPITAL_BASED_OUTPATIENT_CLINIC_OR_DEPARTMENT_OTHER): Payer: Self-pay | Admitting: Emergency Medicine

## 2021-07-20 ENCOUNTER — Telehealth: Payer: Self-pay | Admitting: Gastroenterology

## 2021-07-20 ENCOUNTER — Emergency Department (HOSPITAL_BASED_OUTPATIENT_CLINIC_OR_DEPARTMENT_OTHER)
Admission: EM | Admit: 2021-07-20 | Discharge: 2021-07-20 | Disposition: A | Discharge status: Home / Self Care | Payer: Medicare Other | Attending: Emergency Medicine | Admitting: Emergency Medicine

## 2021-07-20 ENCOUNTER — Other Ambulatory Visit (HOSPITAL_BASED_OUTPATIENT_CLINIC_OR_DEPARTMENT_OTHER): Payer: Self-pay

## 2021-07-20 ENCOUNTER — Emergency Department (HOSPITAL_BASED_OUTPATIENT_CLINIC_OR_DEPARTMENT_OTHER): Payer: Medicare Other

## 2021-07-20 ENCOUNTER — Other Ambulatory Visit: Payer: Self-pay

## 2021-07-20 DIAGNOSIS — Z79899 Other long term (current) drug therapy: Secondary | ICD-10-CM | POA: Insufficient documentation

## 2021-07-20 DIAGNOSIS — R109 Unspecified abdominal pain: Secondary | ICD-10-CM | POA: Diagnosis present

## 2021-07-20 DIAGNOSIS — R1032 Left lower quadrant pain: Secondary | ICD-10-CM | POA: Insufficient documentation

## 2021-07-20 LAB — CBC
HCT: 34.8 % — ABNORMAL LOW (ref 36.0–46.0)
Hemoglobin: 11.2 g/dL — ABNORMAL LOW (ref 12.0–15.0)
MCH: 26 pg (ref 26.0–34.0)
MCHC: 32.2 g/dL (ref 30.0–36.0)
MCV: 80.9 fL (ref 80.0–100.0)
Platelets: 230 10*3/uL (ref 150–400)
RBC: 4.3 MIL/uL (ref 3.87–5.11)
RDW: 16.1 % — ABNORMAL HIGH (ref 11.5–15.5)
WBC: 7.8 10*3/uL (ref 4.0–10.5)
nRBC: 0 % (ref 0.0–0.2)

## 2021-07-20 LAB — URINALYSIS, ROUTINE W REFLEX MICROSCOPIC
Bilirubin Urine: NEGATIVE
Glucose, UA: NEGATIVE mg/dL
Hgb urine dipstick: NEGATIVE
Ketones, ur: NEGATIVE mg/dL
Leukocytes,Ua: NEGATIVE
Nitrite: NEGATIVE
Specific Gravity, Urine: 1.025 (ref 1.005–1.030)
pH: 5.5 (ref 5.0–8.0)

## 2021-07-20 LAB — COMPREHENSIVE METABOLIC PANEL
ALT: 21 U/L (ref 0–44)
AST: 19 U/L (ref 15–41)
Albumin: 4.4 g/dL (ref 3.5–5.0)
Alkaline Phosphatase: 85 U/L (ref 38–126)
Anion gap: 9 (ref 5–15)
BUN: 25 mg/dL — ABNORMAL HIGH (ref 6–20)
CO2: 23 mmol/L (ref 22–32)
Calcium: 9.2 mg/dL (ref 8.9–10.3)
Chloride: 106 mmol/L (ref 98–111)
Creatinine, Ser: 1.01 mg/dL — ABNORMAL HIGH (ref 0.44–1.00)
GFR, Estimated: >60 mL/min (ref >60)
Glucose, Bld: 94 mg/dL (ref 70–99)
Potassium: 4.1 mmol/L (ref 3.5–5.1)
Sodium: 138 mmol/L (ref 135–145)
Total Bilirubin: 0.4 mg/dL (ref 0.3–1.2)
Total Protein: 6.8 g/dL (ref 6.5–8.1)

## 2021-07-20 LAB — LIPASE, BLOOD: Lipase: 14 U/L (ref 11–51)

## 2021-07-20 LAB — PREGNANCY, URINE: Preg Test, Ur: NEGATIVE

## 2021-07-20 MED ORDER — METRONIDAZOLE 500 MG PO TABS: 500.0000 mg | ORAL_TABLET | Freq: Three times a day (TID) | ORAL | 0 refills | Status: AC

## 2021-07-20 MED ORDER — CIPROFLOXACIN HCL 500 MG PO TABS: 500.0000 mg | ORAL_TABLET | Freq: Two times a day (BID) | ORAL | 0 refills | Status: AC

## 2021-07-20 MED ORDER — ONDANSETRON HCL 4 MG/2ML IJ SOLN
4.0000 mg | Freq: Once | INTRAMUSCULAR | Status: AC
  Administered 2021-07-20: 4 mg via INTRAVENOUS
  Filled 2021-07-20: qty 2

## 2021-07-20 MED ORDER — ONDANSETRON 8 MG PO TBDP
8.0000 mg | ORAL_TABLET | Freq: Three times a day (TID) | ORAL | 0 refills | Status: DC | PRN
  Filled 2021-07-20 – 2021-08-05 (×2): qty 20, 7d supply, fill #0

## 2021-07-20 MED ORDER — ESOMEPRAZOLE MAGNESIUM 40 MG PO CPDR: DELAYED_RELEASE_CAPSULE | ORAL | 0 refills | Status: DC

## 2021-07-20 MED ORDER — IOHEXOL 300 MG/ML  SOLN
100.0000 mL | Freq: Once | INTRAMUSCULAR | Status: AC | PRN
  Administered 2021-07-20: 75 mL via INTRAVENOUS

## 2021-07-20 MED ORDER — DICYCLOMINE HCL 10 MG PO CAPS: 10.0000 mg | ORAL_CAPSULE | Freq: Four times a day (QID) | ORAL | 0 refills | Status: DC | PRN

## 2021-07-20 MED ORDER — FENTANYL CITRATE PF 50 MCG/ML IJ SOSY
50.0000 ug | PREFILLED_SYRINGE | Freq: Once | INTRAMUSCULAR | Status: AC
  Administered 2021-07-20: 50 ug via INTRAVENOUS
  Filled 2021-07-20: qty 1

## 2021-07-20 NOTE — ED Triage Notes (Signed)
Left side flank pain front and back. Started yesterday. N/v but denies diarrhea or constipation. No relief with otc. 10/10 pain history of diverticulosis.

## 2021-07-20 NOTE — Discharge Instructions (Signed)
See your primary doctor tomorrow as planned. Work-up today did not show any obvious abnormalities, as we discussed possibly have an early diverticulitis.  Keep a clear liquid diet for the next 3 days.  Call your GI doctor to schedule a follow-up appointment.  You can take Zofran every 8 hours as needed for nausea.

## 2021-07-20 NOTE — Telephone Encounter (Signed)
Patient called wanting to speak with nurse about Diverticulitis. Please advise.

## 2021-07-20 NOTE — Telephone Encounter (Signed)
This has been addressed. Please see 07/19/21 patient message.

## 2021-07-20 NOTE — ED Notes (Signed)
Pt warned not  drive home to due to admin of medication, Pt texted friend to pick her up

## 2021-07-20 NOTE — ED Notes (Signed)
Patient transported to CT 

## 2021-07-20 NOTE — ED Provider Notes (Addendum)
[place] [facility] EMERGENCY DEPT Provider Note   CSN: [id number] Arrival date & time: 07/20/21  1132     History  Chief Complaint  Patient presents with   Flank Pain    Jordan [name] is a 61 y.o. female.   Flank Pain   Patient with medical history of high for lipidemia, fibromyalgia, panic disorder, MDD, history of anorexia and bulimia, ADD presents today due to left lower quadrant pain.  States it started a week ago, worsened last night.  The pain was intermittent but has been constant since since last night.  It starts in the front of her abdomen and radiates to the back, feels sharp.  Moving in bowel movements make the pain worse, unsure about any provoking features.  No alleviating features.  Associated with nausea and 1 episode of emesis yesterday.  Denies any hematemesis, diarrhea, constipation, dysuria, hematuria, fevers.  States she has a history of diverticulitis and this feels similar.  Of note, patient has been having chronic left-sided back pain for 3 years.  She recently was referred to [facility] neurology.  States that it typically is in the front of her abdomen.  Home Medications Prior to Admission medications   Medication Sig Start Date End Date Taking? Authorizing Provider  ondansetron (ZOFRAN-ODT) 8 MG disintegrating tablet Take 1 tablet (8 mg total) by mouth every 8 (eight) hours as needed for nausea or vomiting. 07/20/21  Yes Sherrill Raring, PA-C  ALPRAZolam Duanne Moron) 0.5 MG tablet Take 0.5 mg by mouth at bedtime as needed. For sleep.  Also 1/2 tab during the day.    [provider]  amphetamine-dextroamphetamine (ADDERALL XR) 15 MG 24 hr capsule Take by mouth. 02/19/21   [provider]  amphetamine-dextroamphetamine (ADDERALL XR) 30 MG 24 hr capsule Take 30 mg by mouth every morning. Pt reported 20 mg    [provider]  amphetamine-dextroamphetamine (ADDERALL XR) 30 MG 24 hr capsule Take 1 capsule (30 mg total) by mouth  daily. 07/05/21 08/04/21  Elwanda Brooklyn, NP  amphetamine-dextroamphetamine (ADDERALL) 20 MG tablet Take 1 tablet (20 mg total) by mouth daily. 07/05/21 08/04/21  Elwanda Brooklyn, NP  busPIRone (BUSPAR) 15 MG tablet Take 1 tablet three times daily, at least 7-8 hours between doses (45 mg total daily). 07/05/21   Elwanda Brooklyn, NP  cyclobenzaprine (FLEXERIL) 10 MG tablet TAKE ONE TABLET BY MOUTH EVERY NIGHT AT BEDTIME AS NEEDED FOR MUSCLE SPASMS 06/06/21   Isaac Bliss, Rayford Halsted, MD  dicyclomine (BENTYL) 10 MG capsule TAKE 1 CAPSULE BY MOUTH EVERY 8 HOURS AS NEEDED FOR SPASMS 11/08/20   Armbruster, Carlota Raspberry, MD  DULoxetine (CYMBALTA) 60 MG capsule Take 1 capsule (60 mg total) by mouth 2 (two) times daily. 07/05/21   Elwanda Brooklyn, NP  esomeprazole (NEXIUM) 40 MG capsule TAKE 1 CAPSULE BY MOUTH TWO TIMES A DAY BEFORE MEALS. Please keep your appointment on 06-09-21 05/03/21   Yetta Flock, MD  fluticasone [facility]) 50 MCG/ACT nasal spray PLACE 1 SPRAY INTO BOTH NOSTRILS DAILY AS NEEDED FOR ALLERGIES OR RHINITIS 01/11/21   Isaac Bliss, Rayford Halsted, MD  lubiprostone (AMITIZA) 8 MCG capsule TAKE 1 CAPSULE (8 MCG TOTAL) BY MOUTH 2 (TWO) TIMES DAILY WITH A MEAL. 01/13/20   Willia Craze, NP  meloxicam (MOBIC) 15 MG tablet Take 1 tablet (15 mg total) by mouth daily. 06/21/21   Mcarthur Rossetti, MD  sucralfate (CARAFATE) 1 g tablet Take 1 tablet (1 g total) by mouth every  6 (six) hours as needed. Slowly dissolve 1 tablet in 1 Tablespoon of distilled water prior to taking 02/11/21   Armbruster, Carlota Raspberry, MD  traZODone (DESYREL) 50 MG tablet Take 2 tablets (100 mg total) by mouth at bedtime. 07/05/21   Elwanda Brooklyn, NP      Allergies    Prednisone, Pregabalin, and Remeron [mirtazapine]    Review of Systems   Review of Systems  Genitourinary:  Positive for flank pain.   Physical Exam Updated Vital Signs BP 132/71   Pulse 78   Temp 98.3 F (36.8 C) (Oral)   Resp 16   Ht '5\' 3"'$  (1.6 m)    Wt 63.5 kg   SpO2 96%   BMI 24.80 kg/m  Physical Exam Vitals and nursing note reviewed. Exam conducted with a chaperone present.  Constitutional:      Appearance: Normal appearance.  HENT:     Head: Normocephalic and atraumatic.  Eyes:     General: No scleral icterus.       Right eye: No discharge.        Left eye: No discharge.     Extraocular Movements: Extraocular movements intact.     Pupils: Pupils are equal, round, and reactive to light.  Cardiovascular:     Rate and Rhythm: Normal rate and regular rhythm.     Pulses: Normal pulses.     Heart sounds: Normal heart sounds. No murmur heard.   No friction rub. No gallop.  Pulmonary:     Effort: Pulmonary effort is normal. No respiratory distress.     Breath sounds: Normal breath sounds.  Abdominal:     General: Abdomen is flat. Bowel sounds are normal. There is no distension.     Palpations: Abdomen is soft.     Tenderness: There is abdominal tenderness. There is no right CVA tenderness or left CVA tenderness.     Comments: Left lower quadrant tenderness no CVA tenderness.  No peritoneal signs, abdomen is soft without rigidity or guarding.  Skin:    General: Skin is warm and dry.     Coloration: Skin is not jaundiced.  Neurological:     Mental Status: She is alert. Mental status is at baseline.     Coordination: Coordination normal.    ED Results / Procedures / Treatments   Labs (all labs ordered are listed, but only abnormal results are displayed) Labs Reviewed  COMPREHENSIVE METABOLIC PANEL - Abnormal; Notable for the following components:      Result Value   BUN 25 (*)    Creatinine, Ser 1.01 (*)    All other components within normal limits  CBC - Abnormal; Notable for the following components:   Hemoglobin 11.2 (*)    HCT 34.8 (*)    RDW 16.1 (*)    All other components within normal limits  URINALYSIS, ROUTINE W REFLEX MICROSCOPIC - Abnormal; Notable for the following components:   Protein, ur TRACE (*)     All other components within normal limits  LIPASE, BLOOD  PREGNANCY, URINE    EKG None  Radiology CT Abdomen Pelvis W Contrast  Result Date: 07/20/2021 CLINICAL DATA:  Left lower quadrant abdominal pain radiating to the pelvis beginning last night. History of diverticulosis. EXAM: CT ABDOMEN AND PELVIS WITH CONTRAST TECHNIQUE: Multidetector CT imaging of the abdomen and pelvis was performed using the standard protocol following bolus administration of intravenous contrast. RADIATION DOSE REDUCTION: This exam was performed according to the departmental dose-optimization program which includes automated  exposure control, adjustment of the mA and/or kV according to patient size and/or use of iterative reconstruction technique. CONTRAST:  70m OMNIPAQUE IOHEXOL 300 MG/ML  SOLN COMPARISON:  04/21/2019 FINDINGS: Lower chest: Hiatal hernia as seen previously. Lung bases are clear. Hepatobiliary: Normal Pancreas: Normal Spleen: Normal Adrenals/Urinary Tract: Adrenal glands are normal. Kidneys are normal. Bladder is normal. Stomach/Bowel: Stomach is normal except for the hiatal hernia. No small bowel abnormality. Diverticulosis of the left colon without visible diverticulitis. Vascular/Lymphatic: Aorta and IVC are normal.  No adenopathy. Reproductive: Normal Other: No free fluid or air. Musculoskeletal: Normal IMPRESSION: No abnormality seen to explain the clinical presentation. The patient does have diverticulosis of the left colon but there is no visible diverticulitis. Low level diverticulitis can be inapparent at imaging. Hiatal hernia as seen previously without complicating feature. Electronically Signed   By: MNelson ChimesM.D.   On: 07/20/2021 13:38    Procedures Procedures    Medications Ordered in ED Medications  fentaNYL (SUBLIMAZE) injection 50 mcg (50 mcg Intravenous Given 07/20/21 1249)  ondansetron (ZOFRAN) injection 4 mg (4 mg Intravenous Given 07/20/21 1247)  iohexol (OMNIPAQUE) 300 MG/ML  solution 100 mL (75 mLs Intravenous Contrast Given 07/20/21 1323)    ED Course/ Medical Decision Making/ A&P                           Medical Decision Making Amount and/or Complexity of Data Reviewed Labs: ordered. Radiology: ordered.  Risk Prescription drug management.   Patient presents due to left lower quadrant abdominal pain.  Differential diagnosis includes but not limited to diverticulitis, SBO, UTI, pyelonephritis, nephrolithiasis, MSK, colitis, IBS.  On exam patient's vitals are stable, she is not febrile or tachycardic.  Does not meet SIRS criteria and I doubt this is sepsis.  No peritoneal signs on exam although there is left lower quadrant abdominal pain.  No CVA tenderness making nephrolithiasis slightly less likely.  I ordered fentanyl and Zofran for the nausea and pain.  I ordered, viewed and interpreted laboratory work-up.  No leukocytosis, stable anemia with a hemoglobin of 11.2.  Slight elevation of creatinine at 1.01 BUN at 25 but no gross electrolyte derangement, LFT elevation or AKI.  UA is without hematuria or evidence of underlying UTI.  Lipase is within normal limits.  CT abdomen ordered and viewed by myself.  I agree with radiologist interpretation of diverticulosis but no acute findings of diverticulitis.  On reevaluation patient's pain is improved.  She tolerating p.o.  Repeat abdominal exam shows no change, abdomen remains soft without rigidity or guarding.  Patient was kept on cardiac monitoring, she is remained in sinus rhythm based my interpretation.  I suspect patient likely has diverticulosis, we discussed she could have early diverticulitis or low-grade diverticulitis in which case she should keep a liquid diet for the next 3 days.  She has a GI doctor has not been able to see them, she does have follow-up with her primary doctor tomorrow.  I encouraged her to keep that appointment for repeat abdominal exam and reevaluation.  At this time I do not feel  she needs additional work-up or admission, advised Tylenol and Motrin for pain.  Discharged in stable condition.   Final Clinical Impression(s) / ED Diagnoses Final diagnoses:  Left lower quadrant abdominal pain    Rx / DC Orders ED Discharge Orders          Ordered    ondansetron (ZOFRAN-ODT) 8 MG disintegrating tablet  Every 8 hours PRN        07/20/21 1401              Sherrill Raring, PA-C 07/20/21 1400    Sherrill Raring, PA-C 07/20/21 1416    Luna Fuse, MD 07/30/21 [phone]

## 2021-07-20 NOTE — Telephone Encounter (Signed)
Patient called to state that she is heading up to [facility] as she is in pain. She states that she will keep her appointment for tomorrow in case she is not seen at ED today.       FYI

## 2021-07-20 NOTE — Telephone Encounter (Signed)
Noted  

## 2021-07-21 ENCOUNTER — Ambulatory Visit: Payer: Medicare Other | Admitting: Internal Medicine

## 2021-07-24 ENCOUNTER — Encounter: Payer: Self-pay | Admitting: Internal Medicine

## 2021-07-25 ENCOUNTER — Other Ambulatory Visit: Payer: Self-pay | Admitting: Internal Medicine

## 2021-07-25 ENCOUNTER — Telehealth: Payer: Self-pay | Admitting: Behavioral Health

## 2021-07-25 ENCOUNTER — Other Ambulatory Visit: Payer: Self-pay

## 2021-07-25 DIAGNOSIS — Z1231 Encounter for screening mammogram for malignant neoplasm of breast: Secondary | ICD-10-CM

## 2021-07-25 MED ORDER — ALPRAZOLAM 0.5 MG PO TABS: 0.2500 mg | ORAL_TABLET | Freq: Every evening | ORAL | 0 refills | Status: DC | PRN

## 2021-07-25 NOTE — Telephone Encounter (Signed)
Xanax 0.'5mg'$   one TID if needed. Needs this Xanax RX called in for her. Last Rxed by Dr. Toy Care. 30 day supply Please send to [place].

## 2021-07-25 NOTE — Telephone Encounter (Signed)
Pended.

## 2021-07-25 NOTE — Telephone Encounter (Signed)
Pt lvm at 9:40 am asking for a refill on her xanax 0.5 mg. She said that she was using it up from old dr. So brian needs tosend in a script to the [facility] on [address]  at [facility]

## 2021-07-25 NOTE — Telephone Encounter (Signed)
Duplicate message. 

## 2021-07-26 ENCOUNTER — Telehealth (INDEPENDENT_AMBULATORY_CARE_PROVIDER_SITE_OTHER): Payer: Medicare Other | Admitting: Internal Medicine

## 2021-07-26 ENCOUNTER — Encounter: Payer: Self-pay | Admitting: Internal Medicine

## 2021-07-26 VITALS — Wt 137.0 lb

## 2021-07-26 DIAGNOSIS — R1032 Left lower quadrant pain: Secondary | ICD-10-CM

## 2021-07-26 DIAGNOSIS — R7302 Impaired glucose tolerance (oral): Secondary | ICD-10-CM | POA: Diagnosis not present

## 2021-07-26 DIAGNOSIS — D509 Iron deficiency anemia, unspecified: Secondary | ICD-10-CM

## 2021-07-26 DIAGNOSIS — Z09 Encounter for follow-up examination after completed treatment for conditions other than malignant neoplasm: Secondary | ICD-10-CM | POA: Diagnosis not present

## 2021-07-26 NOTE — Progress Notes (Signed)
Virtual Visit via Video Note  I connected with Jordan [name] on 07/26/21 at  1:00 PM EDT by a video enabled telemedicine application and verified that I am speaking with the correct person using two identifiers.  Location patient: home Location provider: work office Persons participating in the virtual visit: patient, provider  I discussed the limitations of evaluation and management by telemedicine and the availability of in person appointments. The patient expressed understanding and agreed to proceed.   HPI: She has scheduled this visit as an emergency department follow-up.  She was seen in the ED on 5/31 after having low-grade temperature of 100, nausea and vomiting.  She also has left lower quadrant pain that has been present for months to years.  She has had a full orthopedic work-up.  She does have a labral tear but pain is not thought to be related to this.  She has a GI appointment scheduled for next week.  She has also been referred to [facility] neurology for further work-up.  She is improved.  In the ED she had a CT scan of the abdomen that showed diverticulosis only without signs of diverticulitis.  She also had blood indices that seem to be consistent with iron deficiency with a hemoglobin of 11 a low MCV and high RDW.  She had mildly elevated renal function with a BUN of 25 and a creatinine of 1.01 but this seems to be very close to her baseline and likely slightly worsened by GI losses at the time.   ROS: Constitutional: Denies fever, chills, diaphoresis. HEENT: Denies photophobia, eye pain, redness, hearing loss, ear pain, congestion, sore throat, rhinorrhea, sneezing, mouth sores, trouble swallowing, neck pain, neck stiffness and tinnitus.   Respiratory: Denies SOB, DOE, cough, chest tightness,  and wheezing.   Cardiovascular: Denies chest pain, palpitations and leg swelling.  Gastrointestinal: Denies nausea, vomiting, abdominal pain, diarrhea, constipation, blood  in stool and abdominal distention.  Genitourinary: Denies dysuria, urgency, frequency, hematuria, flank pain and difficulty urinating.  Endocrine: Denies: hot or cold intolerance, sweats, changes in hair or nails, polyuria, polydipsia. Musculoskeletal: Denies myalgias, back pain, joint swelling, arthralgias and gait problem.  Skin: Denies pallor, rash and wound.  Neurological: Denies dizziness, seizures, syncope, weakness, light-headedness, numbness and headaches.  Hematological: Denies adenopathy. Easy bruising, personal or family bleeding history  Psychiatric/Behavioral: Denies suicidal ideation, mood changes, confusion, nervousness, sleep disturbance and agitation   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    Lump or mass in breast    Meningitis, unspecified(322.9)    Oral thrush    Other specified glaucoma    Perforation of tympanic membrane, unspecified    Radial styloid tenosynovitis    Seizures ([place])     Past Surgical History:  Procedure Laterality Date   ADENOIDECTOMY  ANKLE SURGERY  1992   right ankle - scar tissue   DILATION AND CURETTAGE OF UTERUS  2006   History of Menometrorrhagia   INNER EAR SURGERY  1966 to 1986   6 surgeries on right ear   LAPAROSCOPY  1993   normal   REDUCTION MAMMAPLASTY Bilateral 2018   uterine ablation  2011   WRIST SURGERY  2001   for de-quervaine - bilateral wrists - tendonitis    Family  History  Problem Relation Age of Onset   Dementia Mother    Alcohol abuse Mother    Ovarian cancer Mother    Heart disease Mother    Hypertension Mother    Coronary artery disease Mother    Colon polyps Mother 64   Heart failure Father    Hypertension Father    Hyperlipidemia Father    Diabetes Father    Alcohol abuse Sister    Anxiety disorder Maternal Aunt    Depression Maternal Aunt    Alcohol abuse Maternal Aunt    Alcohol abuse Maternal Uncle    Alcohol abuse Maternal Grandfather    Breast cancer Maternal Grandmother    Colon cancer Neg Hx    Esophageal cancer Neg Hx    Stomach cancer Neg Hx    Rectal cancer Neg Hx     SOCIAL HX:   reports that she has never smoked. She has never used smokeless tobacco. She reports current alcohol use of about 7.0 standard drinks per week. She reports that she does not use drugs.   Current Outpatient Medications:    ALPRAZolam (XANAX) 0.5 MG tablet, Take 0.5-1 tablets (0.25-0.5 mg total) by mouth at bedtime as needed for anxiety or sleep., Disp: 45 tablet, Rfl: 0   amphetamine-dextroamphetamine (ADDERALL XR) 15 MG 24 hr capsule, Take by mouth., Disp: , Rfl:    amphetamine-dextroamphetamine (ADDERALL XR) 30 MG 24 hr capsule, Take 30 mg by mouth every morning. Pt reported 20 mg, Disp: , Rfl:    amphetamine-dextroamphetamine (ADDERALL XR) 30 MG 24 hr capsule, Take 1 capsule (30 mg total) by mouth daily., Disp: 30 capsule, Rfl: 0   amphetamine-dextroamphetamine (ADDERALL) 20 MG tablet, Take 1 tablet (20 mg total) by mouth daily., Disp: 30 tablet, Rfl: 0   busPIRone (BUSPAR) 15 MG tablet, Take 1 tablet three times daily, at least 7-8 hours between doses (45 mg total daily)., Disp: 90 tablet, Rfl: 2   ciprofloxacin (CIPRO) 500 MG tablet, Take 1 tablet (500 mg total) by mouth 2 (two) times daily for 7 days., Disp: 14 tablet, Rfl: 0   cyclobenzaprine (FLEXERIL) 10 MG tablet, TAKE ONE TABLET BY MOUTH EVERY NIGHT AT BEDTIME AS NEEDED FOR MUSCLE  SPASMS, Disp: 30 tablet, Rfl: 2   dicyclomine (BENTYL) 10 MG capsule, Take 1 capsule (10 mg total) by mouth every 6 (six) hours as needed (abdominal pain)., Disp: 30 capsule, Rfl: 0   DULoxetine (CYMBALTA) 60 MG capsule, Take 1 capsule (60 mg total) by mouth 2 (two) times daily., Disp: 270 capsule, Rfl: 2   esomeprazole (NEXIUM) 40 MG capsule, TAKE 1 CAPSULE BY MOUTH TWO TIMES A DAY BEFORE MEALS., Disp: 180 capsule, Rfl: 0   fluticasone (FLONASE) 50 MCG/ACT nasal spray, PLACE 1 SPRAY INTO BOTH NOSTRILS DAILY AS NEEDED FOR ALLERGIES OR RHINITIS, Disp: 16 mL, Rfl: 2   lubiprostone (AMITIZA) 8 MCG capsule, TAKE 1 CAPSULE (8 MCG TOTAL) BY MOUTH 2 (TWO) TIMES DAILY WITH A MEAL., Disp: 60 capsule, Rfl: 3   meloxicam (MOBIC) 15  MG tablet, Take 1 tablet (15 mg total) by mouth daily., Disp: 30 tablet, Rfl: 1   metroNIDAZOLE (FLAGYL) 500 MG tablet, Take 1 tablet (500 mg total) by mouth 3 (three) times daily for 7 days., Disp: 21 tablet, Rfl: 0   ondansetron (ZOFRAN-ODT) 8 MG disintegrating tablet, Take 1 tablet (8 mg total) by mouth every 8 (eight) hours as needed for nausea or vomiting., Disp: 20 tablet, Rfl: 0   sucralfate (CARAFATE) 1 g tablet, Take 1 tablet (1 g total) by mouth every 6 (six) hours as needed. Slowly dissolve 1 tablet in 1 Tablespoon of distilled water prior to taking, Disp: 60 tablet, Rfl: 1   traZODone (DESYREL) 50 MG tablet, Take 2 tablets (100 mg total) by mouth at bedtime., Disp: 90 tablet, Rfl: 2  Current Facility-Administered Medications:    betamethasone acetate-betamethasone sodium phosphate (CELESTONE) injection 12 mg, 12 mg, Other, Once, Magnus Sinning, MD   methylPREDNISolone acetate (DEPO-MEDROL) injection 80 mg, 80 mg, Other, Once, Magnus Sinning, MD  EXAMTonette Bihari per patient if applicable: None reported  GENERAL: alert, oriented, appears well and in no acute distress, wears corrective lenses  HEENT: atraumatic, conjunttiva clear, no obvious abnormalities on  inspection of external nose and ears  NECK: normal movements of the head and neck  LUNGS: on inspection no signs of respiratory distress, breathing rate appears normal, no obvious gross increased work of breathing, gasping or wheezing  CV: no obvious cyanosis  MS: moves all visible extremities without noticeable abnormality  PSYCH/NEURO: pleasant and cooperative, no obvious depression or anxiety, speech and thought processing grossly intact  ASSESSMENT AND PLAN:   Hospital discharge follow-up  IGT (impaired glucose tolerance)  Iron deficiency anemia, unspecified iron deficiency anemia type  LLQ pain  -Emergency department records have been reviewed in detail including comprehensive lab and imaging results. -Next time she is in office we will go ahead and check iron studies and recheck renal function. -Agree with following up with GI but I believe it is unlikely that this is related to diverticulitis given chronicity and normal CT. -Her last colonoscopy was in 2021 and she was advised 7-year follow-up. -Agree with [facility] neurology referral as ordered.  Time spent: 32 minutes reviewing charts, interviewing patient and formulating plan of care.   I discussed the assessment and treatment plan with the patient. The patient was provided an opportunity to ask questions and all were answered. The patient agreed with the plan and demonstrated an understanding of the instructions.   The patient was advised to call back or seek an in-person evaluation if the symptoms worsen or if the condition fails to improve as anticipated.    Lelon Frohlich, MD  [facility] at [facility]

## 2021-08-01 ENCOUNTER — Ambulatory Visit: Payer: Medicare Other | Admitting: Gastroenterology

## 2021-08-01 ENCOUNTER — Encounter: Payer: Self-pay | Admitting: Gastroenterology

## 2021-08-01 ENCOUNTER — Other Ambulatory Visit (INDEPENDENT_AMBULATORY_CARE_PROVIDER_SITE_OTHER): Payer: Medicare Other

## 2021-08-01 ENCOUNTER — Encounter: Payer: Self-pay | Admitting: Orthopaedic Surgery

## 2021-08-01 VITALS — BP 120/78 | HR 94 | Ht 63.0 in | Wt 142.0 lb

## 2021-08-01 DIAGNOSIS — K219 Gastro-esophageal reflux disease without esophagitis: Secondary | ICD-10-CM | POA: Diagnosis not present

## 2021-08-01 DIAGNOSIS — M545 Low back pain, unspecified: Secondary | ICD-10-CM | POA: Diagnosis not present

## 2021-08-01 DIAGNOSIS — D649 Anemia, unspecified: Secondary | ICD-10-CM

## 2021-08-01 DIAGNOSIS — G8929 Other chronic pain: Secondary | ICD-10-CM

## 2021-08-01 DIAGNOSIS — K5900 Constipation, unspecified: Secondary | ICD-10-CM

## 2021-08-01 LAB — CBC WITH DIFFERENTIAL/PLATELET
Basophils Absolute: 0.1 10*3/uL (ref 0.0–0.1)
Basophils Relative: 0.7 % (ref 0.0–3.0)
Eosinophils Absolute: 0.3 10*3/uL (ref 0.0–0.7)
Eosinophils Relative: 2.8 % (ref 0.0–5.0)
HCT: 37.1 % (ref 36.0–46.0)
Hemoglobin: 12 g/dL (ref 12.0–15.0)
Lymphocytes Relative: 25.7 % (ref 12.0–46.0)
Lymphs Abs: 2.4 10*3/uL (ref 0.7–4.0)
MCHC: 32.3 g/dL (ref 30.0–36.0)
MCV: 81 fl (ref 78.0–100.0)
Monocytes Absolute: 0.6 10*3/uL (ref 0.1–1.0)
Monocytes Relative: 6 % (ref 3.0–12.0)
Neutro Abs: 6.2 10*3/uL (ref 1.4–7.7)
Neutrophils Relative %: 64.8 % (ref 43.0–77.0)
Platelets: 247 10*3/uL (ref 150.0–400.0)
RBC: 4.58 Mil/uL (ref 3.87–5.11)
RDW: 16.7 % — ABNORMAL HIGH (ref 11.5–15.5)
WBC: 9.5 10*3/uL (ref 4.0–10.5)

## 2021-08-01 NOTE — Progress Notes (Signed)
HPI :  61 year old female here for follow-up visit for lower back pain, history of diverticulitis, GERD, large hiatal hernia.  I last saw her in the office in December 2021 to discuss some of these issues.  The main issue she has had bothering her lately has been left lower back pain.  She states its been ongoing for 3 years now after she fell off of a ladder.  She states she has had a labral tear in her hip.  She has pain there mostly all the time, never really goes away, can be rated upwards of 8-9 out of 10 which she is experiencing today.  Does not have much positional component to it.  Tends to maintain persistent discomfort.  Occasionally she will have some slight improvement with a bowel movement but it is fleeting and short-lived.  She has been seen by multiple orthopedic specialists and has had some injections in her back, 3 total, 1 of which really helped, the other 2 did not.  She has been using Flexeril as needed which does not help.  She has been maintained on some chronic ibuprofen or meloxicam use.  She is also been on Cymbalta chronically.  She has been referred to [facility] neurosurgery for second opinion.  She has had multiple MRIs and CT scans for imaging.  She was having worsening pain at the end of May when she reached out to [place].  There was some radiation into her left lower side.  She was concerned about diverticulitis.  She went to the emergency room and had a CT scan of her abdomen pelvis which showed no clear cause for her symptoms.  She did not have diverticulitis.  No leukocytosis.  We had discussed options and she wanted a trial of antibiotics to treat for diverticulitis.  She took a course of this and it did not help her.  She has been tried on hydrocortisone in the past and did not like the way it made her feel.  She wants to be off narcotics.  She has not had any blood in her stool or problems with constipation lately.  Of note she does have a mild anemia with hemoglobins in the  11's with a low MCV.  Her last colonoscopy was in 2021 showing some diverticulosis, 1 small polyp.  She has been on Amitiza in the past for constipation, not really taking it too frequently.  She takes MiraLAX every day, seems to be controlling her constipation fairly well.  She has been using dicyclomine as needed which does not really help any of her symptoms in her back.  She has been on Nexium 40 mg twice daily for her chronic reflux symptoms.  She states this typically controls her symptoms pretty well.  She is try changing her diet and trying to lose a little bit of weight, she states things have been a bit better in this regard.  We did discuss long-term risks of chronic PPI use today.  She does not have any history of CKD or osteopenia.  Prior workup:  EGD 05/24/2016 - 4cm HH, 43m polyp removed at 30cm,  Colonoscopy 09/2013 - normal   GES 03/27/18 - normal   CT scan 04/21/19 - IMPRESSION: 1. Moderate to large hiatal hernia, paraesophageal and sliding type, with no signs of stranding about the herniated stomach. This is new when compared to the previous examination. 2. No evidence of acute intra-abdominal pathology.     CT scan 09/04/18: [facility] - suspected sigmoid diverticulitis, moderate  hiatal hernia     05/09/19 colonoscopy -The examined portion of the ileum was normal. - One 3 mm polyp in the cecum, removed with a cold snare. Resected and retrieved. - Diverticulosis in the sigmoid colon. - The examination was otherwise normal   05/09/19  EGD -Esophagogastric landmarks identified. - 5 cm hiatal hernia. - Normal esophagus otherwise - A single gastric polyp, benign appearing. Resected and retrieved. - Normal stomach otherwise - biopsies taken for H pylori for eradication testing post therapy - Normal duodenal bulb and second portion of the duodenum.   MR pelvis 06/24/21: IMPRESSION: Mild bilateral hip osteoarthritis with degenerative anterior superior labral tearing. Findings of  left-sided ischiofemoral impingement. Tendinosis and low-grade partial tearing of the left proximal hamstrings. Mild peritrochanteric edema bilaterally. No evidence of gluteal tendon tear.   CT abdomen / pelvis 07/20/21: IMPRESSION: No abnormality seen to explain the clinical presentation. The patient does have diverticulosis of the left colon but there is no visible diverticulitis. Low level diverticulitis can be inapparent at imaging.   Hiatal hernia as seen previously without complicating feature.     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    Lump or mass in breast    Meningitis, unspecified(322.9)    Oral thrush    Other specified glaucoma    Perforation of tympanic membrane, unspecified    Radial styloid tenosynovitis    Seizures ([place])      Past Surgical History:  Procedure Laterality Date   ADENOIDECTOMY     ANKLE SURGERY  1992   right ankle - scar tissue   DILATION AND CURETTAGE OF UTERUS  2006   History of Menometrorrhagia   INNER EAR SURGERY  1966 to 1986   6 surgeries on right ear   LAPAROSCOPY  1993   normal   REDUCTION MAMMAPLASTY Bilateral 2018   uterine ablation  2011   WRIST SURGERY  2001   for de-quervaine - bilateral wrists - tendonitis   Family History  Problem Relation Age of Onset   Dementia Mother    Alcohol abuse Mother    Ovarian cancer Mother    Heart disease Mother    Hypertension Mother     Coronary artery disease Mother    Colon polyps Mother 7   Heart failure Father    Hypertension Father    Hyperlipidemia Father    Diabetes Father    Alcohol abuse Sister    Anxiety disorder Maternal Aunt    Depression Maternal Aunt    Alcohol abuse Maternal Aunt    Alcohol abuse Maternal Uncle    Alcohol abuse Maternal Grandfather    Breast cancer Maternal Grandmother    Colon cancer Neg Hx    Esophageal cancer Neg Hx    Stomach cancer Neg Hx    Rectal cancer Neg  Hx    Social History   Tobacco Use   Smoking status: Never   Smokeless tobacco: Never  Vaping Use   Vaping Use: Never used  Substance Use Topics   Alcohol use: Yes    Alcohol/week: 7.0 standard drinks of alcohol    Types: 7 Glasses of wine per week    Comment: 2 glasses of wine per night   Drug use: No   Current Outpatient Medications  Medication Sig Dispense Refill   ALPRAZolam (XANAX) 0.5 MG tablet Take 0.5-1 tablets (0.25-0.5 mg total) by mouth at bedtime as needed for anxiety or sleep. 45 tablet 0   amphetamine-dextroamphetamine (ADDERALL XR) 30 MG 24 hr capsule Take 1 capsule (30 mg total) by mouth daily. 30 capsule 0   amphetamine-dextroamphetamine (ADDERALL) 20 MG tablet Take 1 tablet (20 mg total) by mouth daily. 30 tablet 0   busPIRone (BUSPAR) 15 MG tablet Take 1 tablet three times daily, at least 7-8 hours between doses (45 mg total daily). 90 tablet 2   cyclobenzaprine (FLEXERIL) 10 MG tablet TAKE ONE TABLET BY MOUTH EVERY NIGHT AT BEDTIME AS NEEDED FOR MUSCLE SPASMS 30 tablet 2   DULoxetine (CYMBALTA) 60 MG capsule Take 1 capsule (60 mg total) by mouth 2 (two) times daily. 270 capsule 2   esomeprazole (NEXIUM) 40 MG capsule TAKE 1 CAPSULE BY MOUTH TWO TIMES A DAY BEFORE MEALS. 180 capsule 0   fluticasone (FLONASE) 50 MCG/ACT nasal spray PLACE 1 SPRAY INTO BOTH NOSTRILS DAILY AS NEEDED FOR ALLERGIES OR RHINITIS 16 mL 2   lubiprostone (AMITIZA) 8 MCG capsule TAKE 1 CAPSULE (8 MCG TOTAL) BY MOUTH 2 (TWO)  TIMES DAILY WITH A MEAL. 60 capsule 3   meloxicam (MOBIC) 15 MG tablet Take 1 tablet (15 mg total) by mouth daily. 30 tablet 1   ondansetron (ZOFRAN-ODT) 8 MG disintegrating tablet Take 1 tablet (8 mg total) by mouth every 8 (eight) hours as needed for nausea or vomiting. 20 tablet 0   sucralfate (CARAFATE) 1 g tablet Take 1 tablet (1 g total) by mouth every 6 (six) hours as needed. Slowly dissolve 1 tablet in 1 Tablespoon of distilled water prior to taking 60 tablet 1   traZODone (DESYREL) 50 MG tablet Take 2 tablets (100 mg total) by mouth at bedtime. 90 tablet 2   dicyclomine (BENTYL) 10 MG capsule Take 1 capsule (10 mg total) by mouth every 6 (six) hours as needed (abdominal pain). (Patient not taking: Reported on 08/01/2021) 30 capsule 0   Current Facility-Administered Medications  Medication Dose Route Frequency Provider Last Rate Last Admin   betamethasone acetate-betamethasone sodium phosphate (CELESTONE) injection 12 mg  12 mg Other Once Magnus Sinning, MD       methylPREDNISolone acetate (DEPO-MEDROL) injection 80 mg  80 mg Other Once Magnus Sinning, MD       Allergies  Allergen Reactions   Prednisone Rash    Face redness and swollen per patient   Pregabalin Other (See Comments)    Delusions   Remeron [Mirtazapine] Other (See Comments)    Severe hallucinations     Review of Systems: All systems reviewed and negative except where noted in HPI.   Lab Results  Component Value Date   WBC 7.8 07/20/2021   HGB 11.2 (L) 07/20/2021   HCT 34.8 (L) 07/20/2021   MCV 80.9 07/20/2021   PLT 230 07/20/2021    Lab Results  Component Value Date   CREATININE 1.01 (H) 07/20/2021   BUN 25 (H) 07/20/2021  NA 138 07/20/2021   K 4.1 07/20/2021   CL 106 07/20/2021   CO2 23 07/20/2021    Lab Results  Component Value Date   ALT 21 07/20/2021   AST 19 07/20/2021   ALKPHOS 85 07/20/2021   BILITOT 0.4 07/20/2021     Physical Exam: BP 120/78   Pulse 94   Ht '5\' 3"'$  (1.6 m)    Wt 142 lb (64.4 kg)   SpO2 97%   BMI 25.15 kg/m  Constitutional: Pleasant,well-developed, female in no acute distress. Abdominal: Soft, nondistended, nontender.  There are no masses palpable. No hepatomegaly. Back - some tenderness to palpation of left lower back Extremities: no edema Neurological: Alert and oriented to person place and time. Psychiatric: Normal mood and affect. Behavior is normal.   ASSESSMENT AND PLAN: 61 year old female here for reassessment of the following:  Lower back pain - left side Constipation GERD Long-term PPI use Anemia  We discussed all of these issues today.  She has had persistent left lower back pain since a fall off a ladder 3 years ago.  She has been followed by orthopedics and persistent discomfort there mostly all the time.  She had some radiation into her left lower side in recent weeks and was concerned about diverticulitis, CT scan did not support that diagnosis.  Discussed full differential for her symptoms, I really do not think her GI tract is causing the pain in her left lower back.  She has some fleeting short-lived relief with a bowel movement but I think it very unlikely that bowel spasm is causing her presentation.  I do think she should follow-up with the tertiary care referral that has been presented to her for orthopedic / neurosurgery at [facility], she could also consider acupuncture or alternative therapies like chiropractor if she wants.  She does not want to use narcotics which I agree with.  She is using ibuprofen frequently and discussed risks of that.  She does have a microcytic mild anemia, suspicious for iron deficiency, will test for that today.  I recommend long-term she reduce the amount of ibuprofen she is taking and she is agreeable with that.  MiraLAX is treating her constipation and she is not really needing to use Amitiza right now which is good.  I will give her an empiric trial of IBgard to see if that changes anything in  regards to her symptoms but I think it is unlikely to provide relief to her back pain. Colonoscopy is up to date.  We discussed her reflux in general, she has been doing better with a little bit of weight loss and dietary change lately.  Hopefully we can titrate down her PPI over time.  We discussed long-term risks of it, want to use the lowest dose possible to control her symptoms.  She will try taking 40 mg 1 day and 40 mg twice daily the next and hopefully can titrate down over time if she tolerates it.  She agrees with the plan, further recommendations pending the results of her labs and course.  Plan: - lab for CBC and iron studies today - reduce / stop daily ibuprofen use - trial of IB gard - samples provided - she will follow through with Neurosurgery evaluation at [facility] - reduce omeprazole to once daily as tolerated  Follow up one year or sooner with questions / concerns  Jolly Mango, MD [facility]

## 2021-08-01 NOTE — Patient Instructions (Signed)
If you are age 61 or older, your body mass index should be between 23-30. Your Body mass index is 25.15 kg/m. If this is out of the aforementioned range listed, please consider follow up with your Primary Care Provider.  If you are age 75 or younger, your body mass index should be between 19-25. Your Body mass index is 25.15 kg/m. If this is out of the aformentioned range listed, please consider follow up with your Primary Care Provider.   ________________________________________________________  The [place] [facility] providers would like to encourage you to use [facility] to communicate with providers for non-urgent requests or questions.  Due to long hold times on the telephone, sending your provider a message by [facility] may be a faster and more efficient way to get a response.  Please allow 48 business hours for a response.  Please remember that this is for non-urgent requests.  _______________________________________________________  Your provider has requested that you go to the basement level for lab work before leaving today. Press "B" on the elevator. The lab is located at the first door on the left as you exit the elevator.  Due to recent changes in healthcare laws, you may see the results of your imaging and laboratory studies on MyChart before your provider has had a chance to review them.  We understand that in some cases there may be results that are confusing or concerning to you. Not all laboratory results come back in the same time frame and the provider may be waiting for multiple results in order to interpret others.  Please give [place] 48 hours in order for your provider to thoroughly review all the results before contacting the office for clarification of your results.   We have given you samples of the following medication to take: IB Guard 1-2 tablets as needed up to 3 times a day.  Decrease the ibuprofen as discussed.  Decrease omeprazole to once a day.  Keep follow up as scheduled with  Neuro-surgery as needed.

## 2021-08-02 ENCOUNTER — Telehealth: Payer: Self-pay | Admitting: Behavioral Health

## 2021-08-02 ENCOUNTER — Ambulatory Visit: Payer: Medicare Other | Admitting: Behavioral Health

## 2021-08-02 ENCOUNTER — Other Ambulatory Visit: Payer: Self-pay

## 2021-08-02 ENCOUNTER — Encounter: Payer: Self-pay | Admitting: Internal Medicine

## 2021-08-02 ENCOUNTER — Other Ambulatory Visit: Payer: Self-pay | Admitting: Student in an Organized Health Care Education/Training Program

## 2021-08-02 ENCOUNTER — Encounter: Payer: Self-pay | Admitting: Orthopaedic Surgery

## 2021-08-02 ENCOUNTER — Encounter: Payer: Self-pay | Admitting: Gastroenterology

## 2021-08-02 DIAGNOSIS — F900 Attention-deficit hyperactivity disorder, predominantly inattentive type: Secondary | ICD-10-CM

## 2021-08-02 DIAGNOSIS — D649 Anemia, unspecified: Secondary | ICD-10-CM

## 2021-08-02 DIAGNOSIS — Z1231 Encounter for screening mammogram for malignant neoplasm of breast: Secondary | ICD-10-CM

## 2021-08-02 LAB — IBC + FERRITIN
Ferritin: 4 ng/mL — ABNORMAL LOW (ref 10.0–291.0)
Iron: 22 ug/dL — ABNORMAL LOW (ref 42–145)
Saturation Ratios: 4.2 % — ABNORMAL LOW (ref 20.0–50.0)
TIBC: 529.2 ug/dL — ABNORMAL HIGH (ref 250.0–450.0)
Transferrin: 378 mg/dL — ABNORMAL HIGH (ref 212.0–360.0)

## 2021-08-02 MED ORDER — FERROUS SULFATE 325 (65 FE) MG PO TBEC: 325.0000 mg | DELAYED_RELEASE_TABLET | Freq: Every day | ORAL | 0 refills | Status: DC

## 2021-08-02 MED ORDER — AMPHETAMINE-DEXTROAMPHET ER 30 MG PO CP24: 30.0000 mg | ORAL_CAPSULE | Freq: Every day | ORAL | 0 refills | Status: DC

## 2021-08-02 MED ORDER — AMPHETAMINE-DEXTROAMPHETAMINE 20 MG PO TABS: 20.0000 mg | ORAL_TABLET | Freq: Every day | ORAL | 0 refills | Status: DC

## 2021-08-02 NOTE — Telephone Encounter (Signed)
Pended.

## 2021-08-02 NOTE — Telephone Encounter (Signed)
Called and spoke with patient. She has been scheduled for an EGD in the [place] on Thursday, 08/18/21 at 10 am. Pt is aware that she will need to arrive at 9 am with a care partner. Pt is aware that I will send her instructions via MyChart. Pt verbalized understanding and had no concerns at the end of the call.   EGD instructions sent to pt via MyChart. Ambulatory referral to GI in epic.

## 2021-08-02 NOTE — Telephone Encounter (Signed)
Pt called reporting [facility] out of Adderall. Please canc @ [facility] and sent to [facility]. In stock per Pt. Both generic Adderall Rx to [facility]. XR 30 mg & 20 mg.

## 2021-08-02 NOTE — Telephone Encounter (Signed)
Pt called at 9:15 am. She is sick and cancelled her appointment. She rescheduled for 6/26. However she needs a refill on her adderall xr30 mg and adderall 20 mg. Pharmacy is [facility] on [address]

## 2021-08-03 ENCOUNTER — Other Ambulatory Visit: Payer: Self-pay | Admitting: Internal Medicine

## 2021-08-03 ENCOUNTER — Other Ambulatory Visit (HOSPITAL_BASED_OUTPATIENT_CLINIC_OR_DEPARTMENT_OTHER): Payer: Self-pay

## 2021-08-04 ENCOUNTER — Encounter: Payer: Self-pay | Admitting: Gastroenterology

## 2021-08-04 ENCOUNTER — Other Ambulatory Visit: Payer: Self-pay

## 2021-08-04 ENCOUNTER — Telehealth: Payer: Self-pay | Admitting: Gastroenterology

## 2021-08-04 ENCOUNTER — Other Ambulatory Visit (HOSPITAL_BASED_OUTPATIENT_CLINIC_OR_DEPARTMENT_OTHER): Payer: Self-pay

## 2021-08-04 ENCOUNTER — Encounter: Payer: Self-pay | Admitting: Internal Medicine

## 2021-08-04 MED ORDER — ONDANSETRON 4 MG PO TBDP
4.0000 mg | ORAL_TABLET | Freq: Four times a day (QID) | ORAL | 1 refills | Status: DC | PRN
Start: 2021-08-04 — End: 2021-08-04
  Filled 2021-08-04: qty 20, 5d supply, fill #0

## 2021-08-04 MED ORDER — ONDANSETRON 4 MG PO TBDP: 4.0000 mg | ORAL_TABLET | Freq: Four times a day (QID) | ORAL | 1 refills | Status: DC | PRN

## 2021-08-04 NOTE — Telephone Encounter (Signed)
Inbound call from patient stating that she went to her pharmacy to pick up ondanstron and they did not have it. Patient stated she would like it to be resent to [facility]. Please advise.

## 2021-08-04 NOTE — Telephone Encounter (Signed)
Script sent as requested. 

## 2021-08-05 ENCOUNTER — Other Ambulatory Visit (HOSPITAL_BASED_OUTPATIENT_CLINIC_OR_DEPARTMENT_OTHER): Payer: Self-pay

## 2021-08-08 ENCOUNTER — Encounter: Payer: Self-pay | Admitting: Orthopaedic Surgery

## 2021-08-10 ENCOUNTER — Ambulatory Visit: Payer: Medicare Other | Admitting: Family Medicine

## 2021-08-10 ENCOUNTER — Encounter: Payer: Self-pay | Admitting: Orthopaedic Surgery

## 2021-08-11 ENCOUNTER — Ambulatory Visit: Payer: Medicare Other | Admitting: Nurse Practitioner

## 2021-08-11 ENCOUNTER — Encounter: Payer: Self-pay | Admitting: Orthopaedic Surgery

## 2021-08-11 ENCOUNTER — Encounter: Payer: Self-pay | Admitting: Physical Medicine and Rehabilitation

## 2021-08-12 ENCOUNTER — Encounter: Payer: Self-pay | Admitting: Family Medicine

## 2021-08-12 ENCOUNTER — Ambulatory Visit (INDEPENDENT_AMBULATORY_CARE_PROVIDER_SITE_OTHER): Payer: Medicare Other | Admitting: Family Medicine

## 2021-08-12 VITALS — BP 104/68 | HR 93 | Temp 98.6°F | Ht 63.0 in | Wt 140.7 lb

## 2021-08-12 DIAGNOSIS — H9201 Otalgia, right ear: Secondary | ICD-10-CM | POA: Diagnosis not present

## 2021-08-15 ENCOUNTER — Telehealth: Payer: Self-pay | Admitting: Behavioral Health

## 2021-08-15 ENCOUNTER — Encounter: Payer: Self-pay | Admitting: Behavioral Health

## 2021-08-15 ENCOUNTER — Ambulatory Visit: Payer: Medicare Other | Admitting: Family Medicine

## 2021-08-15 ENCOUNTER — Ambulatory Visit: Payer: Medicare Other | Admitting: Behavioral Health

## 2021-08-15 DIAGNOSIS — F411 Generalized anxiety disorder: Secondary | ICD-10-CM

## 2021-08-15 DIAGNOSIS — F900 Attention-deficit hyperactivity disorder, predominantly inattentive type: Secondary | ICD-10-CM | POA: Diagnosis not present

## 2021-08-15 DIAGNOSIS — F331 Major depressive disorder, recurrent, moderate: Secondary | ICD-10-CM | POA: Diagnosis not present

## 2021-08-15 MED ORDER — ALPRAZOLAM 0.5 MG PO TABS: 0.5000 mg | ORAL_TABLET | Freq: Every evening | ORAL | 0 refills | Status: DC | PRN

## 2021-08-16 ENCOUNTER — Other Ambulatory Visit (HOSPITAL_BASED_OUTPATIENT_CLINIC_OR_DEPARTMENT_OTHER): Payer: Self-pay

## 2021-08-16 ENCOUNTER — Ambulatory Visit: Payer: Self-pay

## 2021-08-16 ENCOUNTER — Ambulatory Visit: Payer: Medicare Other | Admitting: Physical Medicine and Rehabilitation

## 2021-08-16 ENCOUNTER — Encounter: Payer: Self-pay | Admitting: Physical Medicine and Rehabilitation

## 2021-08-16 VITALS — BP 111/62 | HR 65

## 2021-08-16 DIAGNOSIS — M47816 Spondylosis without myelopathy or radiculopathy, lumbar region: Secondary | ICD-10-CM

## 2021-08-16 DIAGNOSIS — M25552 Pain in left hip: Secondary | ICD-10-CM | POA: Diagnosis not present

## 2021-08-16 MED ORDER — METHYLPREDNISOLONE ACETATE 80 MG/ML IJ SUSP: 80.0000 mg | Freq: Once | INTRAMUSCULAR | Status: DC

## 2021-08-16 NOTE — Progress Notes (Signed)
Pt state lower back pain. Pt state sitting and laying down makes the pain worse. Pt state she takes over the counter pain meds to help ease her pain.   Numeric Pain Rating Scale and Functional Assessment Average Pain 9   In the last MONTH (on 0-10 scale) has pain interfered with the following?  1. General activity like being  able to carry out your everyday physical activities such as walking, climbing stairs, carrying groceries, or moving a chair?  Rating(10)   +Driver, -BT, -Dye Allergies.

## 2021-08-17 ENCOUNTER — Encounter: Payer: Self-pay | Admitting: Physical Medicine and Rehabilitation

## 2021-08-17 ENCOUNTER — Encounter: Payer: Self-pay | Admitting: Gastroenterology

## 2021-08-17 ENCOUNTER — Other Ambulatory Visit: Payer: Self-pay | Admitting: Internal Medicine

## 2021-08-17 ENCOUNTER — Encounter: Payer: Self-pay | Admitting: Orthopaedic Surgery

## 2021-08-17 DIAGNOSIS — Z1231 Encounter for screening mammogram for malignant neoplasm of breast: Secondary | ICD-10-CM

## 2021-08-18 ENCOUNTER — Ambulatory Visit (AMBULATORY_SURGERY_CENTER): Payer: Medicare Other | Admitting: Gastroenterology

## 2021-08-18 ENCOUNTER — Encounter: Payer: Self-pay | Admitting: Gastroenterology

## 2021-08-18 VITALS — BP 114/57 | HR 70 | Temp 97.8°F | Resp 14 | Ht 63.0 in | Wt 142.0 lb

## 2021-08-18 DIAGNOSIS — K317 Polyp of stomach and duodenum: Secondary | ICD-10-CM | POA: Diagnosis not present

## 2021-08-18 DIAGNOSIS — K219 Gastro-esophageal reflux disease without esophagitis: Secondary | ICD-10-CM

## 2021-08-18 DIAGNOSIS — K319 Disease of stomach and duodenum, unspecified: Secondary | ICD-10-CM | POA: Diagnosis not present

## 2021-08-18 DIAGNOSIS — K2970 Gastritis, unspecified, without bleeding: Secondary | ICD-10-CM

## 2021-08-18 DIAGNOSIS — K2100 Gastro-esophageal reflux disease with esophagitis, without bleeding: Secondary | ICD-10-CM | POA: Diagnosis not present

## 2021-08-18 DIAGNOSIS — K449 Diaphragmatic hernia without obstruction or gangrene: Secondary | ICD-10-CM | POA: Diagnosis not present

## 2021-08-18 DIAGNOSIS — D509 Iron deficiency anemia, unspecified: Secondary | ICD-10-CM

## 2021-08-18 MED ORDER — SODIUM CHLORIDE 0.9 % IV SOLN: 500.0000 mL | Freq: Once | INTRAVENOUS | Status: DC

## 2021-08-18 NOTE — Progress Notes (Signed)
Called to room to assist during endoscopic procedure.  Patient ID and intended procedure confirmed with present staff. Received instructions for my participation in the procedure from the performing physician.  

## 2021-08-18 NOTE — Progress Notes (Signed)
History and Physical Interval Note: Patient seen 08/01/21 - has had iron deficiency anemia noted since that time. Was using NSAIDs frequently but has since stopped. On nexium BID. No interval changes otherwise - no overt bleeding. EGD to further evaluate IDA, recent colonoscopy without concerning findings. Have discussed risks / benefits and she wishes to proceed.  08/18/2021 9:47 AM  Jordan [name]  has presented today for endoscopic procedure(s), with the diagnosis of  Encounter Diagnoses  Name Primary?   Iron deficiency anemia, unspecified iron deficiency anemia type Yes   Gastroesophageal reflux disease, unspecified whether esophagitis present   .  The various methods of evaluation and treatment have been discussed with the patient and/or family. After consideration of risks, benefits and other options for treatment, the patient has consented to  the endoscopic procedure(s).   The patient's history has been reviewed, patient examined, no change in status, stable for surgery.  I have reviewed the patient's chart and labs.  Questions were answered to the patient's satisfaction.    Jolly Mango, MD [facility]enter[facility]

## 2021-08-18 NOTE — Op Note (Signed)
[name] , MD Age: 61 Referring MD:  Date of Birth: [birth year] Gender: Female Account #: [id number] Procedure:                Upper GI endoscopy Indications:              Iron deficiency anemia, history of                            gastro-esophageal reflux disease / large hiatal                            hernia, with recent NSAID use for back pain, on                            nexium twice daily Medicines:                Monitored Anesthesia Care Procedure:                Pre-Anesthesia Assessment:                           - Prior to the procedure, a History and Physical                            was performed, and patient medications and                            allergies were reviewed. The patient's tolerance of                            previous anesthesia was also reviewed. The risks                            and benefits of the procedure and the sedation                            options and risks were discussed with the patient.                            All questions were answered, and informed consent                            was obtained. Prior Anticoagulants: The patient has                            taken no previous anticoagulant or antiplatelet                            agents. ASA Grade Assessment: II - A patient with                            mild systemic disease. After reviewing the risks  and benefits, the patient was deemed in                            satisfactory condition to undergo the procedure.                           After obtaining informed consent, the endoscope was                            passed under direct vision. Throughout the                            procedure, the patient's blood pressure, pulse, and                            oxygen saturations were monitored continuously. The                             GIF [id number] #[id number] was introduced through the                            mouth, and advanced to the second part of duodenum.                            The upper GI endoscopy was accomplished without                            difficulty. The patient tolerated the procedure                            well. Scope In: Scope Out: Findings:                 Esophagogastric landmarks were identified: the                            Z-line was found at 33 cm, the gastroesophageal                            junction was found at 33 cm and the upper extent of                            the gastric folds was found at 38 cm from the                            incisors.                           A 5 cm hiatal hernia was present. No obvious                            cameron lesions noted.                           LA Grade A esophagitis was found  at the Jordan [name].                           The exam of the esophagus was otherwise normal.                           A single 3 mm sessile polyp was found in the                            gastric body. The polyp was removed with a cold                            biopsy forceps. Resection and retrieval were                            complete.                           Patchy mild inflammation characterized by erythema                            and granularity was found in the distal gastric                            body. Biopsies were taken with a cold forceps for                            Helicobacter pylori testing from antrum, body,                            incisura.                           The exam of the stomach was otherwise normal.                           The examined duodenum was normal. Complications:            No immediate complications. Estimated blood loss:                            Minimal. Estimated Blood Loss:     Estimated blood loss was minimal. Impression:               - Esophagogastric landmarks  identified.                           - 5 cm hiatal hernia.                           - LA Grade A reflux esophagitis.                           - Normal esophagus otherwise                           - A single gastric  polyp. Resected and retrieved.                           - Gastritis. Biopsied.                           - Normal stomach otherwise.                           - Normal examined duodenum.                           Possible gastritis / esophagitis could contribute                            to iron deficiency. No cameron lesions noted within                            the hernia sac. Small bowel ulceration also                            possible in the setting of NSAID use, which has                            since been stopped. Recommendation:           - Patient has a contact number available for                            emergencies. The signs and symptoms of potential                            delayed complications were discussed with the                            patient. Return to normal activities tomorrow.                            Written discharge instructions were provided to the                            patient.                           - Resume previous diet.                           - Continue present medications (nexium twice daily)                           - Continue iron supplementation                           - Avoid NSAIDs                           - Await pathology results.                           -  Will monitor blood counts on iron supplementation                            and while holding NSAIDs. If iron studies                            normalize, then no further workup is needed                           - Hiatal hernia repair for persistent reflux                            symptoms once the patient is able to (having                            evaluation for back surgery by neurosurgery soon) Remo Lipps P. Bryar Rennie, MD 08/18/2021  10:05:14 AM This report has been signed electronically.

## 2021-08-18 NOTE — Progress Notes (Signed)
Report to PACU, RN, vss, BBS= Clear.  

## 2021-08-18 NOTE — Patient Instructions (Signed)
Please read handouts provided. Continue present medications ( nexium twice daily ). Await pathology results. Avoid NSAIDS. Continue Iron supplementation.   YOU HAD AN ENDOSCOPIC PROCEDURE TODAY AT [facility]:   Refer to the procedure report that was given to you for any specific questions about what was found during the examination.  If the procedure report does not answer your questions, please call your gastroenterologist to clarify.  If you requested that your care partner not be given the details of your procedure findings, then the procedure report has been included in a sealed envelope for you to review at your convenience later.  YOU SHOULD EXPECT: Some feelings of bloating in the abdomen. Passage of more gas than usual.  Walking can help get rid of the air that was put into your GI tract during the procedure and reduce the bloating. If you had a lower endoscopy (such as a colonoscopy or flexible sigmoidoscopy) you may notice spotting of blood in your stool or on the toilet paper. If you underwent a bowel prep for your procedure, you may not have a normal bowel movement for a few days.  Please Note:  You might notice some irritation and congestion in your nose or some drainage.  This is from the oxygen used during your procedure.  There is no need for concern and it should clear up in a day or so.  SYMPTOMS TO REPORT IMMEDIATELY:  Following upper endoscopy (EGD)  Vomiting of blood or coffee ground material  New chest pain or pain under the shoulder blades  Painful or persistently difficult swallowing  New shortness of breath  Fever of 100F or higher  Black, tarry-looking stools  For urgent or emergent issues, a gastroenterologist can be reached at any hour by calling [phone]. Do not use MyChart messaging for urgent concerns.    DIET:  We do recommend a small meal at first, but then you may proceed to your regular diet.  Drink plenty of fluids but you should  avoid alcoholic beverages for 24 hours.  ACTIVITY:  You should plan to take it easy for the rest of today and you should NOT DRIVE or use heavy machinery until tomorrow (because of the sedation medicines used during the test).    FOLLOW UP: Our staff will call the number listed on your records the next business day following your procedure.  We will call around 7:15- 8:00 am to check on you and address any questions or concerns that you may have regarding the information given to you following your procedure. If we do not reach you, we will leave a message.  If you develop any symptoms (ie: fever, flu-like symptoms, shortness of breath, cough etc.) before then, please call [phone].  If you test positive for Covid 19 in the 2 weeks post procedure, please call and report this information to [place].    If any biopsies were taken you will be contacted by phone or by letter within the next 1-3 weeks.  Please call us at [phone] if you have not heard about the biopsies in 3 weeks.    SIGNATURES/CONFIDENTIALITY: You and/or your care partner have signed paperwork which will be entered into your electronic medical record.  These signatures attest to the fact that that the information above on your After Visit Summary has been reviewed and is understood.  Full responsibility of the confidentiality of this discharge information lies with you and/or your care-partner.

## 2021-08-19 ENCOUNTER — Telehealth: Payer: Self-pay | Admitting: *Deleted

## 2021-08-19 NOTE — Telephone Encounter (Signed)
  Follow up Call-     08/18/2021    9:10 AM 05/09/2019   12:48 PM  Call back number  Post procedure Call Back phone  # [phone]  Permission to leave phone message Yes Yes     Patient questions:  Message left to call us if necessary.

## 2021-08-24 ENCOUNTER — Other Ambulatory Visit: Payer: Self-pay | Admitting: Behavioral Health

## 2021-08-24 ENCOUNTER — Inpatient Hospital Stay: Admission: RE | Admit: 2021-08-24 | Payer: Medicare Other | Source: Ambulatory Visit

## 2021-08-24 ENCOUNTER — Telehealth: Payer: Self-pay | Admitting: Behavioral Health

## 2021-08-24 DIAGNOSIS — F5105 Insomnia due to other mental disorder: Secondary | ICD-10-CM

## 2021-08-24 DIAGNOSIS — Z1231 Encounter for screening mammogram for malignant neoplasm of breast: Secondary | ICD-10-CM

## 2021-08-24 DIAGNOSIS — F411 Generalized anxiety disorder: Secondary | ICD-10-CM

## 2021-08-24 DIAGNOSIS — F331 Major depressive disorder, recurrent, moderate: Secondary | ICD-10-CM

## 2021-08-24 DIAGNOSIS — F900 Attention-deficit hyperactivity disorder, predominantly inattentive type: Secondary | ICD-10-CM

## 2021-08-24 MED ORDER — ALPRAZOLAM 0.5 MG PO TABS: 0.5000 mg | ORAL_TABLET | Freq: Two times a day (BID) | ORAL | 0 refills | Status: AC | PRN

## 2021-08-24 NOTE — Telephone Encounter (Signed)
Pt called at 11:12 am and asked for a refill on her xanax. She said that brian was going to increase the quantity of pills. Pharmacy is [facility] [address]

## 2021-08-24 NOTE — Telephone Encounter (Signed)
Please advise pt that I increased quantity of 2 tablets per day (1 mg total daily). Let her know to use these sparingly because I am not going to increase from this amount  or do early refill.

## 2021-08-24 NOTE — Telephone Encounter (Signed)
Pt informed

## 2021-08-24 NOTE — Telephone Encounter (Signed)
Please review filled 6/5

## 2021-08-25 ENCOUNTER — Other Ambulatory Visit: Payer: Self-pay

## 2021-08-25 DIAGNOSIS — D509 Iron deficiency anemia, unspecified: Secondary | ICD-10-CM

## 2021-08-30 ENCOUNTER — Telehealth: Payer: Self-pay | Admitting: Behavioral Health

## 2021-08-30 ENCOUNTER — Other Ambulatory Visit: Payer: Self-pay

## 2021-08-30 DIAGNOSIS — F900 Attention-deficit hyperactivity disorder, predominantly inattentive type: Secondary | ICD-10-CM

## 2021-08-30 MED ORDER — AMPHETAMINE-DEXTROAMPHETAMINE 20 MG PO TABS: 20.0000 mg | ORAL_TABLET | Freq: Every day | ORAL | 0 refills | Status: DC

## 2021-08-30 MED ORDER — AMPHETAMINE-DEXTROAMPHET ER 30 MG PO CP24: 30.0000 mg | ORAL_CAPSULE | Freq: Every day | ORAL | 0 refills | Status: DC

## 2021-08-30 NOTE — Telephone Encounter (Signed)
Noted, thank you

## 2021-08-30 NOTE — Telephone Encounter (Signed)
Pended.

## 2021-08-30 NOTE — Telephone Encounter (Signed)
Pt called at 2:15 pm asking for refills on both of her adderalls. Adderall 20 mg and adderall xr 30 mg. Pharmacy is [facility] on [address]. She wants brian to know she lost her dog this week

## 2021-09-05 ENCOUNTER — Encounter: Payer: Self-pay | Admitting: Orthopaedic Surgery

## 2021-09-05 DIAGNOSIS — M170 Bilateral primary osteoarthritis of knee: Secondary | ICD-10-CM | POA: Diagnosis not present

## 2021-09-05 DIAGNOSIS — M5441 Lumbago with sciatica, right side: Secondary | ICD-10-CM | POA: Diagnosis not present

## 2021-09-05 DIAGNOSIS — M5116 Intervertebral disc disorders with radiculopathy, lumbar region: Secondary | ICD-10-CM | POA: Diagnosis not present

## 2021-09-05 DIAGNOSIS — M5134 Other intervertebral disc degeneration, thoracic region: Secondary | ICD-10-CM | POA: Diagnosis not present

## 2021-09-05 DIAGNOSIS — G8929 Other chronic pain: Secondary | ICD-10-CM | POA: Diagnosis not present

## 2021-09-05 DIAGNOSIS — M160 Bilateral primary osteoarthritis of hip: Secondary | ICD-10-CM | POA: Diagnosis not present

## 2021-09-05 DIAGNOSIS — M5442 Lumbago with sciatica, left side: Secondary | ICD-10-CM | POA: Diagnosis not present

## 2021-09-05 DIAGNOSIS — M5030 Other cervical disc degeneration, unspecified cervical region: Secondary | ICD-10-CM | POA: Diagnosis not present

## 2021-09-06 ENCOUNTER — Encounter: Payer: Self-pay | Admitting: Orthopaedic Surgery

## 2021-09-06 ENCOUNTER — Encounter: Payer: Self-pay | Admitting: Physical Medicine and Rehabilitation

## 2021-09-07 NOTE — Telephone Encounter (Signed)
Please advise 

## 2021-09-08 NOTE — Telephone Encounter (Signed)
Am I referring her anywhere?

## 2021-09-11 ENCOUNTER — Encounter: Payer: Self-pay | Admitting: Internal Medicine

## 2021-09-11 DIAGNOSIS — G35 Multiple sclerosis: Secondary | ICD-10-CM

## 2021-09-11 DIAGNOSIS — H9201 Otalgia, right ear: Secondary | ICD-10-CM

## 2021-09-12 ENCOUNTER — Encounter: Payer: Self-pay | Admitting: Physical Medicine and Rehabilitation

## 2021-09-13 ENCOUNTER — Other Ambulatory Visit: Payer: Self-pay | Admitting: Internal Medicine

## 2021-09-13 DIAGNOSIS — Z1231 Encounter for screening mammogram for malignant neoplasm of breast: Secondary | ICD-10-CM

## 2021-09-13 DIAGNOSIS — M25552 Pain in left hip: Secondary | ICD-10-CM

## 2021-09-13 MED ORDER — BUPIVACAINE HCL 0.25 % IJ SOLN
4.0000 mL | INTRAMUSCULAR | Status: AC | PRN
  Administered 2021-08-16: 4 mL via INTRA_ARTICULAR

## 2021-09-13 MED ORDER — TRIAMCINOLONE ACETONIDE 40 MG/ML IJ SUSP
40.0000 mg | INTRAMUSCULAR | Status: AC | PRN
  Administered 2021-08-16: 40 mg via INTRA_ARTICULAR

## 2021-09-13 NOTE — Progress Notes (Addendum)
Jordan [name] - 61 y.o. female MRN [id number]  Date of birth: 06/30/1960  Office Visit Note: Visit Date: 08/16/2021 PCP: Isaac Bliss, Rayford Halsted, MD Referred by: Mcarthur Rossetti*  Subjective: Chief Complaint  Patient presents with   Lower Back - Pain   HPI:  Jordan [name] is a 61 y.o. female who comes in today at the request of Dr. Jean Rosenthal for planned Left  L4-5 and L5-S1 Lumbar facet/medial branch block with fluoroscopic guidance.  The patient has failed conservative care including home exercise, medications, time and activity modification.  This injection will be diagnostic and hopefully therapeutic.  Please see requesting physician notes for further details and justification.  Exam has shown concordant pain with facet joint loading.   She is also reporting left-sided pain below the belt line without clear physical exam sign of sacroiliac joint dysfunction.  She has had MRI of the pelvis and hip since I have seen her last.  This is reviewed in the note below.  They do make note of left-sided ischiofemoral impingement.  I am less familiar with this type of impingement but clearly can cause a pain syndrome.  At the patient's request and talk with her today we will go ahead and also complete injection over this area from the posterior approach to the juncture of the sacrum and hip.   ROS Otherwise per HPI.  Assessment & Plan: Visit Diagnoses:    ICD-10-CM   1. Spondylosis without myelopathy or radiculopathy, lumbar region  M47.816 XR C-ARM NO REPORT    Facet Injection    methylPREDNISolone acetate (DEPO-MEDROL) injection 80 mg    2. Pain in left hip  M25.552 Left ischiofemoral impingement injection    bupivacaine (MARCAINE) 0.25 % (with pres) injection 4 mL    triamcinolone acetonide (KENALOG-40) injection 40 mg      Plan: No additional findings.   Meds & Orders:  Meds ordered this encounter  Medications   methylPREDNISolone  acetate (DEPO-MEDROL) injection 80 mg   bupivacaine (MARCAINE) 0.25 % (with pres) injection 4 mL   triamcinolone acetonide (KENALOG-40) injection 40 mg    Orders Placed This Encounter  Procedures   Facet Injection   Left ischiofemoral impingement injection   XR C-ARM NO REPORT    Follow-up: Return for visit to requesting provider as needed.   Procedures: Left ischiofemoral impingement injection on 08/16/2021 10:13 AM Indications: pain and diagnostic evaluation Details: 22 G 3.5 in needle, fluoroscopy-guided posterior approach  Arthrogram: No  Medications: 4 mL bupivacaine 0.25 %; 40 mg triamcinolone acetonide 40 MG/ML Outcome: tolerated well, no immediate complications  There was excellent flow of contrast outlining the area of the ischiofemoral impingement.  There was no vascular flow noted. Procedure, treatment alternatives, risks and benefits explained, specific risks discussed. Consent was given by the patient. Immediately prior to procedure a time out was called to verify the correct patient, procedure, equipment, support staff and site/side marked as required. Patient was prepped and draped in the usual sterile fashion.      Lumbar Facet Joint Intra-Articular Injection(s) with Fluoroscopic Guidance  Patient: Jordan [name]      Date of Birth: 1961/01/26 MRN: [id number] PCP: Isaac Bliss, Rayford Halsted, MD      Visit Date: 08/16/2021   Universal Protocol:    Date/Time: 08/16/2021  Consent Given By: the patient  Position: PRONE   Additional Comments: Vital signs were monitored before and after the procedure. Patient was prepped and draped in  the usual sterile fashion. The correct patient, procedure, and site was verified.   Injection Procedure Details:  Procedure Site One Meds Administered:  Meds ordered this encounter  Medications   methylPREDNISolone acetate (DEPO-MEDROL) injection 80 mg     Laterality: Left  Location/Site:   L4-L5 L5-S1  Needle size: 22 guage  Needle type: Spinal  Needle Placement: Articular  Findings:  -Comments: Excellent flow of contrast producing a partial arthrogram.  Procedure Details: The fluoroscope beam is vertically oriented in AP, and the inferior recess is visualized beneath the lower pole of the inferior apophyseal process, which represents the target point for needle insertion. When direct visualization is difficult the target point is located at the medial projection of the vertebral pedicle. The region overlying each aforementioned target is locally anesthetized with a 1 to 2 ml. volume of 1% Lidocaine without Epinephrine.   The spinal needle was inserted into each of the above mentioned facet joints using biplanar fluoroscopic guidance. A 0.25 to 0.5 ml. volume of Isovue-250 was injected and a partial facet joint arthrogram was obtained. A single spot film was obtained of the resulting arthrogram.    One to 1.25 ml of the steroid/anesthetic solution was then injected into each of the facet joints noted above.   Additional Comments:  No complications occurred Dressing: 2 x 2 sterile gauze and Band-Aid    Post-procedure details: Patient was observed during the procedure. Post-procedure instructions were reviewed.  Patient left the clinic in stable condition.    Clinical History: MRI Pelvis with and without contrast IMPRESSION: Mild bilateral hip osteoarthritis with degenerative anterior superior labral tearing.   Findings of left-sided ischiofemoral impingement.   Tendinosis and low-grade partial tearing of the left proximal hamstrings.   Mild peritrochanteric edema bilaterally. No evidence of gluteal tendon tear.     Electronically Signed   By: Maurine Simmering M.D.   On: 06/24/2021 10:43 ---   MRI LUMBAR SPINE WITHOUT CONTRAST   TECHNIQUE: Multiplanar, multisequence MR imaging of the lumbar spine was performed. No intravenous contrast was  administered.   COMPARISON:  CT abdomen/pelvis 04/21/2019   FINDINGS: Segmentation:  Standard; the lowest disc space is designated L5-S1.   Alignment:  Normal.   Vertebrae: Vertebral body heights are preserved. Marrow signal is normal.   Conus medullaris and cauda equina: Conus extends to the mid L1 level. Conus and cauda equina appear normal.   Paraspinal and other soft tissues: The paraspinal soft tissues are unremarkable.   Disc levels:   There is mild disc desiccation without significant loss of height at L3-L4.   There is multilevel facet arthropathy, most advanced at L4-L5. There is mild perifacetal soft tissue edema centered at the L4-L5 facet joint.   T12-L1: No significant spinal canal or neural foraminal stenosis.   L1-L2: No significant spinal canal or neural foraminal stenosis.   L2-L3: There is a minimal disc bulge without significant spinal canal or neural foraminal stenosis.   L3-L4: There is a mild disc bulge, prominent dorsal epidural fat, and mild bilateral facet arthropathy resulting in mild spinal canal stenosis and mild bilateral neural foraminal stenosis. There is no evidence of nerve root impingement.   L4-L5: There is a mild disc bulge, ligamentum flavum thickening, prominent dorsal epidural fat, and bilateral facet arthropathy resulting in mild-to-moderate spinal canal stenosis with crowding of the subarticular zones and mild right and no significant left neural foraminal stenosis. There is no evidence of nerve root impingement.   L5-S1: There is a mild  disc bulge and bilateral facet arthropathy resulting in crowding of the subarticular zones with possible contact of the traversing S1 nerve roots, left more than right.   IMPRESSION: 1. Multilevel facet arthropathy, most advanced at L4-L5 with associated perifacetal soft tissue edema, which could reflect a source of pain. 2. Mild-to-moderate spinal canal stenosis at L4-L5 with crowding  of the subarticular zones but no evidence of nerve root impingement. 3. Crowding of the subarticular zones at L5-S1 with possible contact of the traversing S1 nerve roots, left more than right. 4. Otherwise, no high-grade spinal canal or neural foraminal stenosis and no evidence of nerve root impingement at the remaining levels.     Electronically Signed   By: Valetta Mole M.D.   On: 11/10/2020 10:57     Objective:  VS:  HT:    WT:   BMI:     BP:111/62  HR:65bpm  TEMP: ( )  RESP:  Physical Exam Vitals and nursing note reviewed.  Constitutional:      General: She is not in acute distress.    Appearance: Normal appearance. She is not ill-appearing.  HENT:     Head: Normocephalic and atraumatic.     Right Ear: External ear normal.     Left Ear: External ear normal.  Eyes:     Extraocular Movements: Extraocular movements intact.  Cardiovascular:     Rate and Rhythm: Normal rate.     Pulses: Normal pulses.  Pulmonary:     Effort: Pulmonary effort is normal. No respiratory distress.  Abdominal:     General: There is no distension.     Palpations: Abdomen is soft.  Musculoskeletal:        General: Tenderness present.     Cervical back: Neck supple.     Right lower leg: No edema.     Left lower leg: No edema.     Comments: Patient has good distal strength with no pain over the greater trochanters.  No clonus or focal weakness.  Pain in the posterior lateral area tender palpation left posterior hip not really over the sacroiliac joint.  She has negative Patrick's testing.  Negative SI compression.  Skin:    Findings: No erythema, lesion or rash.  Neurological:     General: No focal deficit present.     Mental Status: She is alert and oriented to person, place, and time.     Sensory: No sensory deficit.     Motor: No weakness or abnormal muscle tone.     Coordination: Coordination normal.  Psychiatric:        Mood and Affect: Mood normal.        Behavior: Behavior  normal.      Imaging: No results found.

## 2021-09-13 NOTE — Addendum Note (Signed)
Addended by: Westley Hummer B on: 09/13/2021 06:56 AM   Modules accepted: Orders

## 2021-09-13 NOTE — Procedures (Signed)
Lumbar Facet Joint Intra-Articular Injection(s) with Fluoroscopic Guidance  Patient: Jordan [name]      Date of Birth: September 06, 1960 MRN: [id number] PCP: Isaac Bliss, Rayford Halsted, MD      Visit Date: 08/16/2021   Universal Protocol:    Date/Time: 08/16/2021  Consent Given By: the patient  Position: PRONE   Additional Comments: Vital signs were monitored before and after the procedure. Patient was prepped and draped in the usual sterile fashion. The correct patient, procedure, and site was verified.   Injection Procedure Details:  Procedure Site One Meds Administered:  Meds ordered this encounter  Medications   methylPREDNISolone acetate (DEPO-MEDROL) injection 80 mg     Laterality: Left  Location/Site:  L4-L5 L5-S1  Needle size: 22 guage  Needle type: Spinal  Needle Placement: Articular  Findings:  -Comments: Excellent flow of contrast producing a partial arthrogram.  Procedure Details: The fluoroscope beam is vertically oriented in AP, and the inferior recess is visualized beneath the lower pole of the inferior apophyseal process, which represents the target point for needle insertion. When direct visualization is difficult the target point is located at the medial projection of the vertebral pedicle. The region overlying each aforementioned target is locally anesthetized with a 1 to 2 ml. volume of 1% Lidocaine without Epinephrine.   The spinal needle was inserted into each of the above mentioned facet joints using biplanar fluoroscopic guidance. A 0.25 to 0.5 ml. volume of Isovue-250 was injected and a partial facet joint arthrogram was obtained. A single spot film was obtained of the resulting arthrogram.    One to 1.25 ml of the steroid/anesthetic solution was then injected into each of the facet joints noted above.   Additional Comments:  No complications occurred Dressing: 2 x 2 sterile gauze and Band-Aid    Post-procedure details: Patient was  observed during the procedure. Post-procedure instructions were reviewed.  Patient left the clinic in stable condition.

## 2021-09-16 ENCOUNTER — Ambulatory Visit (INDEPENDENT_AMBULATORY_CARE_PROVIDER_SITE_OTHER): Payer: Medicare Other | Admitting: Psychology

## 2021-09-16 DIAGNOSIS — F331 Major depressive disorder, recurrent, moderate: Secondary | ICD-10-CM | POA: Diagnosis not present

## 2021-09-16 NOTE — Progress Notes (Signed)
[facility] Counselor/Therapist Progress Note  Patient ID: Jordan [name], MRN: [id number],    Date: 09/16/2021  Time Spent: 4:00pm-4:55pm   55 minutes   Treatment Type: Individual Therapy  Reported Symptoms: sadness, grief  Mental Status Exam: Appearance:  Casual     Behavior: Appropriate  Motor: Normal  Speech/Language:  Normal Rate  Affect: Appropriate  Mood: normal  Thought process: normal  Thought content:   WNL  Sensory/Perceptual disturbances:   WNL  Orientation: oriented to person, place, time/date, and situation  Attention: Good  Concentration: Good  Memory: WNL  Fund of knowledge:  Good  Insight:   Good  Judgment:  Good  Impulse Control: Good   Risk Assessment: Danger to Self:  No Self-injurious Behavior: No Danger to Others: No Duty to Warn:no Physical Aggression / Violence:No  Access to Firearms a concern: No  Gang Involvement:No   Subjective:  Pt present for face-to-face individual therapy via video Webex.  Pt consents to telehealth video session due to COVID 19 pandemic. Location of pt: home Location of therapist: home office.   Pt talked about the recent death of her dog Sunny.  Pt's grief has been very intense and she feels heart broken.  Pt had a very strong bond with Sunny. Pt talked about the day of Sunny's death.  Pt had in home [facility] with her at home and the vet put Sunny down bc there was not anything else that could be done.  Pt has been supported well by friends and everyone who knew [place].  Pt has started to walk around the block twice a day like she did with [place].  She cries on her walks. Pt talked to her cousin who reminded pt that we are God's custodians of pets.  Pt states that helped pt more than anything.  Pt will have a Chiropodist for [employer] at [employer].    Helped pt process her feelings and grief.  Assessed pt's safety and she states she would not hurt herself.  Pt has had decrease in  appetite and difficulty sleeping bc of her grief.   Addressed how pt can take care of herself and continue to reach out to friends.   Pt talked about her relationship with her sister.  She ended her relationship with her sister bc her sister is always angry all the time and pt feels like she can not take it anymore.  Helped pt process her feelings and relationship dynamics.   Worked on increasing self care.  Pt is being intentional about making herself do things such as going kayaking and swimming.  Provided supportive therapy.   Interventions: Cognitive Behavioral Therapy and Insight-Oriented  Diagnosis:F33.1  Plan:  Plan of Care: Recommend ongoing therapy.  Pt participated in setting treatment goals.  She wants to improve coping skills and self esteem.   Plan to meet every couple of weeks.    Treatment Plan (Treatment Plan Target Date:  03/24/2022) Client Abilities/Strengths  Pt is bright, engaging, and motivated for therapy.   Client Treatment Preferences  Individual therapy.  Client Statement of Needs  Improve coping skills.  Symptoms  Depressed or irritable mood. Feelings of hopelessness, worthlessness, or inappropriate guilt. Low self-esteem. Unresolved grief issues.   Problems Addressed  Unipolar Depression Goals 1. Alleviate depressive symptoms and return to previous level of effective functioning. 2. Appropriately grieve the loss in order to normalize mood and to return to previously adaptive level of functioning. Objective Learn and implement behavioral  strategies to overcome depression. Target Date: 2022-03-24 Frequency: Biweekly  Progress: 10 Modality: individual  Related Interventions Engage the client in "behavioral activation," increasing his/her activity level and contact with sources of reward, while identifying processes that inhibit activation.  Use behavioral techniques such as instruction, rehearsal, role-playing, role reversal, as needed, to facilitate  activity in the client's daily life; reinforce success. Assist the client in developing skills that increase the likelihood of deriving pleasure from behavioral activation (e.g., assertiveness skills, developing an exercise plan, less internal/more external focus, increased social involvement); reinforce success. Objective Identify important people in life, past and present, and describe the quality, good and poor, of those relationships. Target Date: 2022-03-24 Frequency: Biweekly  Progress: 10 Modality: individual  Related Interventions Conduct Interpersonal Therapy beginning with the assessment of the client's "interpersonal inventory" of important past and present relationships; develop a case formulation linking depression to grief, interpersonal role disputes, role transitions, and/or interpersonal deficits). Objective Learn and implement problem-solving and decision-making skills. Target Date: 2022-03-24 Frequency: Biweekly  Progress: 10 Modality: individual  Related Interventions Conduct Problem-Solving Therapy using techniques such as psychoeducation, modeling, and role-playing to teach client problem-solving skills (i.e., defining a problem specifically, generating possible solutions, evaluating the pros and cons of each solution, selecting and implementing a plan of action, evaluating the efficacy of the plan, accepting or revising the plan); role-play application of the problem-solving skill to a real life issue. Encourage in the client the development of a positive problem orientation in which problems and solving them are viewed as a natural part of life and not something to be feared, despaired, or avoided. 3. Develop healthy interpersonal relationships that lead to the alleviation and help prevent the relapse of depression. 4. Develop healthy thinking patterns and beliefs about self, others, and the world that lead to the alleviation and help prevent the relapse of depression. 5.  Recognize, accept, and cope with feelings of depression. Diagnosis F33.1  Conditions For Discharge Achievement of treatment goals and objectives     Clint Bolder, LCSW

## 2021-09-20 ENCOUNTER — Other Ambulatory Visit: Payer: Self-pay | Admitting: Gastroenterology

## 2021-09-21 DIAGNOSIS — Z1231 Encounter for screening mammogram for malignant neoplasm of breast: Secondary | ICD-10-CM

## 2021-09-22 ENCOUNTER — Encounter: Payer: Self-pay | Admitting: Internal Medicine

## 2021-09-23 ENCOUNTER — Encounter: Payer: Self-pay | Admitting: Gastroenterology

## 2021-09-23 DIAGNOSIS — D509 Iron deficiency anemia, unspecified: Secondary | ICD-10-CM

## 2021-09-25 ENCOUNTER — Encounter: Payer: Self-pay | Admitting: Internal Medicine

## 2021-09-26 ENCOUNTER — Encounter: Payer: Self-pay | Admitting: Behavioral Health

## 2021-09-26 ENCOUNTER — Ambulatory Visit (INDEPENDENT_AMBULATORY_CARE_PROVIDER_SITE_OTHER): Payer: Medicare Other | Admitting: Internal Medicine

## 2021-09-26 ENCOUNTER — Ambulatory Visit (INDEPENDENT_AMBULATORY_CARE_PROVIDER_SITE_OTHER): Payer: Medicare Other | Admitting: Behavioral Health

## 2021-09-26 ENCOUNTER — Encounter: Payer: Self-pay | Admitting: Internal Medicine

## 2021-09-26 ENCOUNTER — Other Ambulatory Visit: Payer: Medicare Other

## 2021-09-26 VITALS — BP 120/84 | HR 82 | Temp 98.2°F | Wt 139.0 lb

## 2021-09-26 DIAGNOSIS — D509 Iron deficiency anemia, unspecified: Secondary | ICD-10-CM

## 2021-09-26 DIAGNOSIS — F331 Major depressive disorder, recurrent, moderate: Secondary | ICD-10-CM

## 2021-09-26 DIAGNOSIS — R7302 Impaired glucose tolerance (oral): Secondary | ICD-10-CM

## 2021-09-26 DIAGNOSIS — F900 Attention-deficit hyperactivity disorder, predominantly inattentive type: Secondary | ICD-10-CM

## 2021-09-26 DIAGNOSIS — F411 Generalized anxiety disorder: Secondary | ICD-10-CM

## 2021-09-26 DIAGNOSIS — L609 Nail disorder, unspecified: Secondary | ICD-10-CM | POA: Diagnosis not present

## 2021-09-26 LAB — POCT GLYCOSYLATED HEMOGLOBIN (HGB A1C): Hemoglobin A1C: 5.4 % (ref 4.0–5.6)

## 2021-09-26 MED ORDER — ALPRAZOLAM 0.5 MG PO TABS: 0.5000 mg | ORAL_TABLET | Freq: Two times a day (BID) | ORAL | 1 refills | Status: DC | PRN

## 2021-09-26 MED ORDER — DULOXETINE HCL 60 MG PO CPEP: 60.0000 mg | ORAL_CAPSULE | Freq: Two times a day (BID) | ORAL | 2 refills | Status: DC

## 2021-09-26 MED ORDER — TRAZODONE HCL 50 MG PO TABS: 100.0000 mg | ORAL_TABLET | Freq: Every day | ORAL | 2 refills | Status: DC

## 2021-09-26 MED ORDER — BUSPIRONE HCL 15 MG PO TABS: ORAL_TABLET | ORAL | 3 refills | Status: DC

## 2021-09-26 NOTE — Progress Notes (Signed)
Established Patient Office Visit     CC/Reason for Visit: Follow-up chronic conditions, discuss acute concerns  HPI: Jordan [name] is a 61 y.o. female who is coming in today for the above mentioned reasons. Past Medical History is significant for: Depression/anxiety, ADHD.  Chronic back pain that is improved after steroid injection.  She also has iron deficiency anemia that is being managed through GI.  She is taking daily ferrous sulfate, is due for labs today.  She believes she has onychomycosis and is wondering what she can do about this.   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    Lump or mass in breast    Meningitis, unspecified(322.9)    Oral thrush    Other specified glaucoma    Perforation of tympanic membrane, unspecified    Radial styloid tenosynovitis    Seizures ([place])     Past Surgical History:  Procedure Laterality Date   ADENOIDECTOMY     ANKLE SURGERY  1992   right ankle - scar tissue   DILATION AND CURETTAGE OF UTERUS  2006   History of Menometrorrhagia   INNER EAR SURGERY  1966 to 1986   6 surgeries on right ear   LAPAROSCOPY  1993   normal   REDUCTION MAMMAPLASTY Bilateral 2018   uterine ablation  2011   WRIST SURGERY  2001   for de-quervaine - bilateral wrists - tendonitis    Social History:  reports that she has never smoked. She has never used smokeless  tobacco. She reports current alcohol use of about 7.0 standard drinks of alcohol per week. She reports that she does not use drugs.  Allergies: Allergies  Allergen Reactions   Prednisone Rash    Face redness and swollen per patient   Pregabalin Other (See Comments)    Delusions   Remeron [Mirtazapine] Other (See Comments)    Severe hallucinations    Family History:  Family History  Problem Relation Age of Onset   Dementia Mother    Alcohol abuse Mother    Ovarian cancer Mother    Heart disease Mother    Hypertension Mother    Coronary artery disease Mother    Colon polyps Mother 5   Heart failure Father    Hypertension Father    Hyperlipidemia Father    Diabetes Father    Alcohol abuse Sister    Anxiety disorder Maternal Aunt    Depression Maternal Aunt    Alcohol abuse Maternal Aunt    Alcohol abuse Maternal Uncle    Alcohol abuse Maternal Grandfather    Breast cancer Maternal Grandmother    Colon cancer Neg Hx    Esophageal cancer Neg Hx    Stomach cancer Neg Hx    Rectal cancer Neg Hx  Current Outpatient Medications:    ALPRAZolam (XANAX) 0.5 MG tablet, Take 1 tablet (0.5 mg total) by mouth 2 (two) times daily as needed for anxiety., Disp: 60 tablet, Rfl: 1   amphetamine-dextroamphetamine (ADDERALL XR) 30 MG 24 hr capsule, Take 1 capsule (30 mg total) by mouth daily., Disp: 30 capsule, Rfl: 0   amphetamine-dextroamphetamine (ADDERALL) 20 MG tablet, Take 1 tablet (20 mg total) by mouth daily., Disp: 30 tablet, Rfl: 0   busPIRone (BUSPAR) 15 MG tablet, Take 1 tablet three times daily, at least 7-8 hours between doses (45 mg total daily)., Disp: 90 tablet, Rfl: 3   cyclobenzaprine (FLEXERIL) 10 MG tablet, TAKE ONE TABLET BY MOUTH EVERY NIGHT AT BEDTIME AS NEEDED FOR MUSCLE SPASMS, Disp: 30 tablet, Rfl: 2   dicyclomine (BENTYL) 10 MG capsule, Take 1 capsule (10 mg total) by mouth every 6 (six) hours as needed (abdominal pain)., Disp: 30 capsule, Rfl: 0   DULoxetine  (CYMBALTA) 60 MG capsule, Take 1 capsule (60 mg total) by mouth 2 (two) times daily., Disp: 270 capsule, Rfl: 2   esomeprazole (NEXIUM) 40 MG capsule, TAKE 1 CAPSULE BY MOUTH TWICE  DAILY BEFORE MEALS, Disp: 180 capsule, Rfl: 3   ferrous sulfate 325 (65 FE) MG EC tablet, Take 1 tablet (325 mg total) by mouth daily with breakfast., Disp: 30 tablet, Rfl: 0   fluticasone (FLONASE) 50 MCG/ACT nasal spray, SPRAY ONE SPRAY IN EACH NOSTRIL ONCE DAILY AS NEEDED FOR ALLERGIES OR RHINITIS, Disp: 16 mL, Rfl: 2   ondansetron (ZOFRAN-ODT) 4 MG disintegrating tablet, Take 1 tablet (4 mg total) by mouth every 6 (six) hours as needed for nausea or vomiting., Disp: 20 tablet, Rfl: 1   ondansetron (ZOFRAN-ODT) 8 MG disintegrating tablet, Take 1 tablet (8 mg total) by mouth every 8 (eight) hours as needed for nausea or vomiting., Disp: 20 tablet, Rfl: 0   sucralfate (CARAFATE) 1 g tablet, Take 1 tablet (1 g total) by mouth every 6 (six) hours as needed. Slowly dissolve 1 tablet in 1 Tablespoon of distilled water prior to taking, Disp: 60 tablet, Rfl: 1   traZODone (DESYREL) 50 MG tablet, Take 2 tablets (100 mg total) by mouth at bedtime., Disp: 90 tablet, Rfl: 2  Current Facility-Administered Medications:    betamethasone acetate-betamethasone sodium phosphate (CELESTONE) injection 12 mg, 12 mg, Other, Once, Magnus Sinning, MD   methylPREDNISolone acetate (DEPO-MEDROL) injection 80 mg, 80 mg, Other, Once, Magnus Sinning, MD   methylPREDNISolone acetate (DEPO-MEDROL) injection 80 mg, 80 mg, Other, Once, Magnus Sinning, MD  Review of Systems:  Constitutional: Denies fever, chills, diaphoresis, appetite change and fatigue.  HEENT: Denies photophobia, eye pain, redness, hearing loss, ear pain, congestion, sore throat, rhinorrhea, sneezing, mouth sores, trouble swallowing, neck pain, neck stiffness and tinnitus.   Respiratory: Denies SOB, DOE, cough, chest tightness,  and wheezing.   Cardiovascular: Denies chest  pain, palpitations and leg swelling.  Gastrointestinal: Denies nausea, vomiting, abdominal pain, diarrhea, constipation, blood in stool and abdominal distention.  Genitourinary: Denies dysuria, urgency, frequency, hematuria, flank pain and difficulty urinating.  Endocrine: Denies: hot or cold intolerance, sweats,  polyuria, polydipsia. Musculoskeletal: Denies myalgias, back pain, joint swelling, arthralgias and gait problem.  Skin: Denies pallor, rash and wound.  Neurological: Denies dizziness, seizures, syncope, weakness, light-headedness, numbness and headaches.  Hematological: Denies adenopathy. Easy bruising, personal or family bleeding history  Psychiatric/Behavioral: Denies suicidal ideation, mood changes, confusion, nervousness, sleep disturbance and agitation    Physical Exam: Vitals:   09/26/21 1610  BP:  120/84  Pulse: 82  Temp: 98.2 F (36.8 C)  TempSrc: Oral  SpO2: 98%  Weight: 139 lb (63 kg)    Body mass index is 24.62 kg/m.   Constitutional: NAD, calm, comfortable Eyes: PERRL, lids and conjunctivae normal, wears corrective lenses ENMT: Mucous membranes are moist.   Psychiatric: Normal judgment and insight. Alert and oriented x 3. Normal mood.    Impression and Plan:  IGT (impaired glucose tolerance)  - Plan: POCT glycosylated hemoglobin (Hb A1C) -A1c is stable at 5.4.  Iron deficiency anemia, unspecified iron deficiency anemia type  - Plan: CBC with Differential/Platelet, IBC + Ferritin  Nail abnormality -Nails do not have the classic appearance of onychomycosis, she tells me that a while back she ripped both toenails off while hiking.  I wonder if she just has a nailbed deformity.  She has an appointment with podiatry later this week.    Time spent:31 minutes reviewing chart, interviewing and examining patient and formulating plan of care.    Lelon Frohlich, MD [facility]

## 2021-09-26 NOTE — Progress Notes (Signed)
[facility] Check  Patient ID: Jordan [name],  MRN: [id number]  PCP: Isaac Bliss, Rayford Halsted, MD  Date of Evaluation: 09/26/2021 Time spent:30 minutes  Chief Complaint:  Chief Complaint   Anxiety; Depression; Follow-up; Medication Refill     HISTORY/CURRENT STATUS: HPI 61 year old patient presents to this office for follow up and medication management. She is currently dealing with the loss of her pet dog  of 14 years. She is having relationship issues with her sister. She was not able to reduce the xanax since last visit. She says that she is not ready yet to wean down on xanax and says she would like to try next month. Says she sees a Air traffic controller in [place] and continues to see therapist here on regular basis. She denies any current or prior mania.No psychosis, no auditory or visual hallucinations. Strong family hx of dementia. No SI or HI.   Prior psychiatric medication trials:   Abilify Latuda Risperdone Amitriptyline Trintellix Lexapro Prozac Zoloft Paxil Wellbutrin Remeron Effexor Cymbalta Rexulti         Individual Medical History/ Review of Systems: Changes? :No   Allergies: Prednisone, Pregabalin, and Remeron [mirtazapine]  Current Medications:  Current Outpatient Medications:    ALPRAZolam (XANAX) 0.5 MG tablet, Take 1 tablet (0.5 mg total) by mouth 2 (two) times daily as needed for anxiety., Disp: 60 tablet, Rfl: 1   amphetamine-dextroamphetamine (ADDERALL XR) 30 MG 24 hr capsule, Take 1 capsule (30 mg total) by mouth daily., Disp: 30 capsule, Rfl: 0   amphetamine-dextroamphetamine (ADDERALL) 20 MG tablet, Take 1 tablet (20 mg total) by mouth daily., Disp: 30 tablet, Rfl: 0   busPIRone (BUSPAR) 15 MG tablet, Take 1 tablet three times daily, at least 7-8 hours between doses (45 mg total daily)., Disp: 90 tablet, Rfl: 3   cyclobenzaprine (FLEXERIL) 10 MG tablet, TAKE ONE TABLET BY MOUTH EVERY NIGHT AT BEDTIME AS NEEDED FOR MUSCLE  SPASMS, Disp: 30 tablet, Rfl: 2   dicyclomine (BENTYL) 10 MG capsule, Take 1 capsule (10 mg total) by mouth every 6 (six) hours as needed (abdominal pain)., Disp: 30 capsule, Rfl: 0   DULoxetine (CYMBALTA) 60 MG capsule, Take 1 capsule (60 mg total) by mouth 2 (two) times daily., Disp: 270 capsule, Rfl: 2   esomeprazole (NEXIUM) 40 MG capsule, TAKE 1 CAPSULE BY MOUTH TWICE  DAILY BEFORE MEALS, Disp: 180 capsule, Rfl: 3   ferrous sulfate 325 (65 FE) MG EC tablet, Take 1 tablet (325 mg total) by mouth daily with breakfast., Disp: 30 tablet, Rfl: 0   fluticasone (FLONASE) 50 MCG/ACT nasal spray, SPRAY ONE SPRAY IN EACH NOSTRIL ONCE DAILY AS NEEDED FOR ALLERGIES OR RHINITIS, Disp: 16 mL, Rfl: 2   lubiprostone (AMITIZA) 8 MCG capsule, TAKE 1 CAPSULE (8 MCG TOTAL) BY MOUTH 2 (TWO) TIMES DAILY WITH A MEAL. (Patient not taking: Reported on 08/18/2021), Disp: 60 capsule, Rfl: 3   meloxicam (MOBIC) 15 MG tablet, Take 1 tablet (15 mg total) by mouth daily. (Patient not taking: Reported on 08/18/2021), Disp: 30 tablet, Rfl: 1   ondansetron (ZOFRAN-ODT) 4 MG disintegrating tablet, Take 1 tablet (4 mg total) by mouth every 6 (six) hours as needed for nausea or vomiting., Disp: 20 tablet, Rfl: 1   ondansetron (ZOFRAN-ODT) 8 MG disintegrating tablet, Take 1 tablet (8 mg total) by mouth every 8 (eight) hours as needed for nausea or vomiting., Disp: 20 tablet, Rfl: 0   sucralfate (CARAFATE) 1 g tablet, Take 1 tablet (1 g  total) by mouth every 6 (six) hours as needed. Slowly dissolve 1 tablet in 1 Tablespoon of distilled water prior to taking, Disp: 60 tablet, Rfl: 1   traZODone (DESYREL) 50 MG tablet, Take 2 tablets (100 mg total) by mouth at bedtime., Disp: 90 tablet, Rfl: 2  Current Facility-Administered Medications:    betamethasone acetate-betamethasone sodium phosphate (CELESTONE) injection 12 mg, 12 mg, Other, Once, Magnus Sinning, MD   methylPREDNISolone acetate (DEPO-MEDROL) injection 80 mg, 80 mg, Other, Once,  Magnus Sinning, MD   methylPREDNISolone acetate (DEPO-MEDROL) injection 80 mg, 80 mg, Other, Once, Magnus Sinning, MD Medication Side Effects: none  Family Medical/ Social History: Changes? No  MENTAL HEALTH EXAM:  There were no vitals taken for this visit.There is no height or weight on file to calculate BMI.  General Appearance: Casual, Neat, and Well Groomed  Eye Contact:  Good  Speech:  Clear and Coherent  Volume:  Normal  Mood:  Anxious, Depressed, and Dysphoric  Affect:  Congruent, Depressed, and Anxious  Thought Process:  Coherent  Orientation:  Full (Time, Place, and Person)  Thought Content: Logical   Suicidal Thoughts:  No  Homicidal Thoughts:  No  Memory:  WNL  Judgement:  Good  Insight:  Good  Psychomotor Activity:  Normal  Concentration:  Concentration: Good  Recall:  Good  Fund of Knowledge: Good  Language: Good  Assets:  Desire for Improvement  ADL's:  Intact  Cognition: WNL  Prognosis:  Good    DIAGNOSES:    ICD-10-CM   1. Major depressive disorder, recurrent episode, moderate (HCC)  F33.1 traZODone (DESYREL) 50 MG tablet    DULoxetine (CYMBALTA) 60 MG capsule    ALPRAZolam (XANAX) 0.5 MG tablet    2. Generalized anxiety disorder  F41.1 busPIRone (BUSPAR) 15 MG tablet    traZODone (DESYREL) 50 MG tablet    DULoxetine (CYMBALTA) 60 MG capsule    ALPRAZolam (XANAX) 0.5 MG tablet    3. Attention deficit hyperactivity disorder (ADHD), predominantly inattentive type  F90.0 traZODone (DESYREL) 50 MG tablet    ALPRAZolam (XANAX) 0.5 MG tablet      Receiving Psychotherapy: Karna Christmas Blauert   RECOMMENDATIONS:   Greater than 50% of 30 face to face time with patient was spent on counseling and coordination of care. We discussed her current stability. She has a lot of situational problems stemming from the relationship with her sister. Her dog recently passed and she was extremely attached. She is still grieving. Recommended grief counseling due to the  level of stress We agreed to: Will continue Cymbalta 60 mg twice daily To continue Trazodone 50 mg at bedtime as reported by pt To continue Adderall 30 mg XR in the am and Adderall 30 mg IR in the afternoon as reported by pt she has been taking. Continue Buspar 3 tablets daily (45 mg total daily). At least 7-8 hours between doses. Continue Xanax 0.5 mg twice daily for severe anxiety only.  To report worsening symptoms or side effects promptly Will follow up in 6 weeks to reassess Provided emergency contact information Discussed potential metabolic side effects associated with atypical antipsychotics, as well as potential risk for movement side effects. Advised pt to contact office if movement side effects occur.   Reviewed PDMP    Elwanda Brooklyn, NP

## 2021-09-28 ENCOUNTER — Encounter: Payer: Self-pay | Admitting: Internal Medicine

## 2021-10-04 ENCOUNTER — Other Ambulatory Visit: Payer: Self-pay | Admitting: Internal Medicine

## 2021-10-04 ENCOUNTER — Ambulatory Visit (INDEPENDENT_AMBULATORY_CARE_PROVIDER_SITE_OTHER): Payer: Medicare Other | Admitting: Psychology

## 2021-10-04 DIAGNOSIS — F331 Major depressive disorder, recurrent, moderate: Secondary | ICD-10-CM

## 2021-10-04 DIAGNOSIS — Z1231 Encounter for screening mammogram for malignant neoplasm of breast: Secondary | ICD-10-CM

## 2021-10-04 NOTE — Progress Notes (Signed)
[facility] Counselor/Therapist Progress Note  Patient ID: Jordan [name], MRN: [id number],    Date: 10/04/2021  Time Spent: 3:00pm-3:45pm   45 minutes   Treatment Type: Individual Therapy  Reported Symptoms: sadness, grief  Mental Status Exam: Appearance:  Casual     Behavior: Appropriate  Motor: Normal  Speech/Language:  Normal Rate  Affect: Appropriate  Mood: normal  Thought process: normal  Thought content:   WNL  Sensory/Perceptual disturbances:   WNL  Orientation: oriented to person, place, time/date, and situation  Attention: Good  Concentration: Good  Memory: WNL  Fund of knowledge:  Good  Insight:   Good  Judgment:  Good  Impulse Control: Good   Risk Assessment: Danger to Self:  No Self-injurious Behavior: No Danger to Others: No Duty to Warn:no Physical Aggression / Violence:No  Access to Firearms a concern: No  Gang Involvement:No   Subjective:  Pt present for face-to-face individual therapy via video Webex.  Pt consents to telehealth video session due to COVID 19 pandemic. Location of pt: home Location of therapist: home office.   Pt talked about missing Sunny who recently died.   Pt states she still cries every day.    Helped pt process her feelings and grief.    Addressed how pt can take care of herself and continue to reach out to friends.    Pt is planning to adopt another dog.  She found a two year old golden retriever named Piper.  She will get her next week.   Worked on increasing self care.  Pt has been isolating due to her sadness and grief.  Addressed how pt can increase social connection.   Provided supportive therapy.  Interventions: Cognitive Behavioral Therapy and Insight-Oriented  Diagnosis:F33.1  Plan of Care: Recommend ongoing therapy.  Pt participated in setting treatment goals.  She wants to improve coping skills and self esteem.   Plan to meet every couple of weeks.    Treatment Plan (Treatment Plan  Target Date:  03/24/2022) Client Abilities/Strengths  Pt is bright, engaging, and motivated for therapy.   Client Treatment Preferences  Individual therapy.  Client Statement of Needs  Improve coping skills.  Symptoms  Depressed or irritable mood. Feelings of hopelessness, worthlessness, or inappropriate guilt. Low self-esteem. Unresolved grief issues.   Problems Addressed  Unipolar Depression Goals 1. Alleviate depressive symptoms and return to previous level of effective functioning. 2. Appropriately grieve the loss in order to normalize mood and to return to previously adaptive level of functioning. Objective Learn and implement behavioral strategies to overcome depression. Target Date: 2022-03-24 Frequency: Biweekly  Progress: 10 Modality: individual  Related Interventions Engage the client in "behavioral activation," increasing his/her activity level and contact with sources of reward, while identifying processes that inhibit activation.  Use behavioral techniques such as instruction, rehearsal, role-playing, role reversal, as needed, to facilitate activity in the client's daily life; reinforce success. Assist the client in developing skills that increase the likelihood of deriving pleasure from behavioral activation (e.g., assertiveness skills, developing an exercise plan, less internal/more external focus, increased social involvement); reinforce success. Objective Identify important people in life, past and present, and describe the quality, good and poor, of those relationships. Target Date: 2022-03-24 Frequency: Biweekly  Progress: 10 Modality: individual  Related Interventions Conduct Interpersonal Therapy beginning with the assessment of the client's "interpersonal inventory" of important past and present relationships; develop a case formulation linking depression to grief, interpersonal role disputes, role transitions, and/or interpersonal deficits). Objective Learn and  implement problem-solving and decision-making skills. Target Date: 2022-03-24 Frequency: Biweekly  Progress: 10 Modality: individual  Related Interventions Conduct Problem-Solving Therapy using techniques such as psychoeducation, modeling, and role-playing to teach client problem-solving skills (i.e., defining a problem specifically, generating possible solutions, evaluating the pros and cons of each solution, selecting and implementing a plan of action, evaluating the efficacy of the plan, accepting or revising the plan); role-play application of the problem-solving skill to a real life issue. Encourage in the client the development of a positive problem orientation in which problems and solving them are viewed as a natural part of life and not something to be feared, despaired, or avoided. 3. Develop healthy interpersonal relationships that lead to the alleviation and help prevent the relapse of depression. 4. Develop healthy thinking patterns and beliefs about self, others, and the world that lead to the alleviation and help prevent the relapse of depression. 5. Recognize, accept, and cope with feelings of depression. Diagnosis F33.1  Conditions For Discharge Achievement of treatment goals and objectives     Clint Bolder, LCSW

## 2021-10-05 ENCOUNTER — Other Ambulatory Visit: Payer: Self-pay

## 2021-10-05 ENCOUNTER — Other Ambulatory Visit: Payer: Self-pay | Admitting: Behavioral Health

## 2021-10-05 ENCOUNTER — Telehealth: Payer: Self-pay | Admitting: Behavioral Health

## 2021-10-05 DIAGNOSIS — F411 Generalized anxiety disorder: Secondary | ICD-10-CM

## 2021-10-05 DIAGNOSIS — F900 Attention-deficit hyperactivity disorder, predominantly inattentive type: Secondary | ICD-10-CM

## 2021-10-05 MED ORDER — AMPHETAMINE-DEXTROAMPHETAMINE 20 MG PO TABS: 20.0000 mg | ORAL_TABLET | Freq: Every day | ORAL | 0 refills | Status: DC

## 2021-10-05 MED ORDER — AMPHETAMINE-DEXTROAMPHET ER 30 MG PO CP24: 30.0000 mg | ORAL_CAPSULE | Freq: Every day | ORAL | 0 refills | Status: DC

## 2021-10-05 NOTE — Telephone Encounter (Signed)
Pended.

## 2021-10-05 NOTE — Telephone Encounter (Signed)
Pt called and said that she needs a refill on both adderall xr  30 mg and adderall 20 mg. Pharmacy is [facility] on [address]. They do have it in stock

## 2021-10-06 ENCOUNTER — Other Ambulatory Visit (INDEPENDENT_AMBULATORY_CARE_PROVIDER_SITE_OTHER): Payer: Medicare Other

## 2021-10-06 DIAGNOSIS — D509 Iron deficiency anemia, unspecified: Secondary | ICD-10-CM

## 2021-10-06 LAB — CBC WITH DIFFERENTIAL/PLATELET
Basophils Absolute: 0.1 10*3/uL (ref 0.0–0.1)
Basophils Relative: 0.7 % (ref 0.0–3.0)
Eosinophils Absolute: 0.4 10*3/uL (ref 0.0–0.7)
Eosinophils Relative: 4.3 % (ref 0.0–5.0)
HCT: 42.9 % (ref 36.0–46.0)
Hemoglobin: 14.5 g/dL (ref 12.0–15.0)
Lymphocytes Relative: 19.3 % (ref 12.0–46.0)
Lymphs Abs: 1.7 10*3/uL (ref 0.7–4.0)
MCHC: 33.7 g/dL (ref 30.0–36.0)
MCV: 89.5 fl (ref 78.0–100.0)
Monocytes Absolute: 0.6 10*3/uL (ref 0.1–1.0)
Monocytes Relative: 6.6 % (ref 3.0–12.0)
Neutro Abs: 6 10*3/uL (ref 1.4–7.7)
Neutrophils Relative %: 69.1 % (ref 43.0–77.0)
Platelets: 212 10*3/uL (ref 150.0–400.0)
RBC: 4.8 Mil/uL (ref 3.87–5.11)
RDW: 20.9 % — ABNORMAL HIGH (ref 11.5–15.5)
WBC: 8.7 10*3/uL (ref 4.0–10.5)

## 2021-10-06 LAB — IBC + FERRITIN
Ferritin: 12.5 ng/mL (ref 10.0–291.0)
Iron: 158 ug/dL — ABNORMAL HIGH (ref 42–145)
Saturation Ratios: 32.9 % (ref 20.0–50.0)
TIBC: 480.2 ug/dL — ABNORMAL HIGH (ref 250.0–450.0)
Transferrin: 343 mg/dL (ref 212.0–360.0)

## 2021-10-07 ENCOUNTER — Encounter: Payer: Self-pay | Admitting: Gastroenterology

## 2021-10-07 ENCOUNTER — Other Ambulatory Visit: Payer: Self-pay

## 2021-10-07 DIAGNOSIS — D509 Iron deficiency anemia, unspecified: Secondary | ICD-10-CM

## 2021-10-07 NOTE — Telephone Encounter (Signed)
See 10/06/21 result note for details.

## 2021-10-12 DIAGNOSIS — M792 Neuralgia and neuritis, unspecified: Secondary | ICD-10-CM | POA: Diagnosis not present

## 2021-10-19 ENCOUNTER — Other Ambulatory Visit: Payer: Self-pay | Admitting: Internal Medicine

## 2021-10-19 DIAGNOSIS — M545 Low back pain, unspecified: Secondary | ICD-10-CM

## 2021-10-25 ENCOUNTER — Telehealth: Payer: Self-pay | Admitting: Physical Medicine and Rehabilitation

## 2021-10-25 NOTE — Telephone Encounter (Signed)
Pt called to set an appt. Please call pt at [phone]

## 2021-10-31 ENCOUNTER — Telehealth (INDEPENDENT_AMBULATORY_CARE_PROVIDER_SITE_OTHER): Payer: Medicare Other | Admitting: Behavioral Health

## 2021-10-31 ENCOUNTER — Encounter: Payer: Self-pay | Admitting: Behavioral Health

## 2021-10-31 DIAGNOSIS — F5105 Insomnia due to other mental disorder: Secondary | ICD-10-CM

## 2021-10-31 DIAGNOSIS — F411 Generalized anxiety disorder: Secondary | ICD-10-CM

## 2021-10-31 DIAGNOSIS — F900 Attention-deficit hyperactivity disorder, predominantly inattentive type: Secondary | ICD-10-CM

## 2021-10-31 DIAGNOSIS — F331 Major depressive disorder, recurrent, moderate: Secondary | ICD-10-CM | POA: Diagnosis not present

## 2021-10-31 DIAGNOSIS — F99 Mental disorder, not otherwise specified: Secondary | ICD-10-CM

## 2021-10-31 NOTE — Progress Notes (Deleted)
[facility]  Patient ID: Jordan [name],  MRN: [id number]  PCP: Isaac Bliss, Rayford Halsted, MD  Date of Evaluation: 10/31/2021 Time spent:30 minutes  Chief Complaint:  Chief Complaint   Anxiety; Depression; Follow-up; Patient Education; Grief     HISTORY/CURRENT STATUS: HPI  61 year old patient presents to this office for follow up and medication management. She is still  grieving with the loss of her pet dog  of 14 years. She recently acquired a new pet dog and says it is helping some. She is having more lower back pain and seeking care tomorrow. She has had previous epidural injections.  She says that she is not ready yet to wean down on xanax and says she would like to try next month. Says she sees a Air traffic controller in [place] and continues to see therapist here on regular basis. She denies any current or prior mania.No psychosis, no auditory or visual hallucinations. Strong family hx of dementia. No SI or HI.   Prior psychiatric medication trials:   Abilify Latuda Risperdone Amitriptyline Trintellix Lexapro Prozac Zoloft Paxil Wellbutrin Remeron Effexor Cymbalta Rexulti       Individual Medical History/ Review of Systems: Changes? :No   Allergies: Prednisone, Pregabalin, and Remeron [mirtazapine]  Current Medications:  Current Outpatient Medications:    ALPRAZolam (XANAX) 0.5 MG tablet, Take 1 tablet (0.5 mg total) by mouth 2 (two) times daily as needed for anxiety., Disp: 60 tablet, Rfl: 1   amphetamine-dextroamphetamine (ADDERALL XR) 30 MG 24 hr capsule, Take 1 capsule (30 mg total) by mouth daily., Disp: 30 capsule, Rfl: 0   amphetamine-dextroamphetamine (ADDERALL) 20 MG tablet, Take 1 tablet (20 mg total) by mouth daily., Disp: 30 tablet, Rfl: 0   busPIRone (BUSPAR) 15 MG tablet, Take 1 tablet three times daily, at least 7-8 hours between doses (45 mg total daily)., Disp: 90 tablet, Rfl: 3   cyclobenzaprine (FLEXERIL) 10 MG tablet,  TAKE ONE TABLET BY MOUTH EVERY NIGHT AT BEDTIME AS NEEDED FOR MUSCLE SPASMS, Disp: 30 tablet, Rfl: 2   dicyclomine (BENTYL) 10 MG capsule, Take 1 capsule (10 mg total) by mouth every 6 (six) hours as needed (abdominal pain)., Disp: 30 capsule, Rfl: 0   DULoxetine (CYMBALTA) 60 MG capsule, Take 1 capsule (60 mg total) by mouth 2 (two) times daily., Disp: 270 capsule, Rfl: 2   esomeprazole (NEXIUM) 40 MG capsule, TAKE 1 CAPSULE BY MOUTH TWICE  DAILY BEFORE MEALS, Disp: 180 capsule, Rfl: 3   ferrous sulfate 325 (65 FE) MG EC tablet, Take 1 tablet (325 mg total) by mouth daily with breakfast., Disp: 30 tablet, Rfl: 0   fluticasone (FLONASE) 50 MCG/ACT nasal spray, SPRAY ONE SPRAY IN EACH NOSTRIL ONCE DAILY AS NEEDED FOR ALLERGIES OR RHINITIS, Disp: 16 mL, Rfl: 2   ondansetron (ZOFRAN-ODT) 4 MG disintegrating tablet, Take 1 tablet (4 mg total) by mouth every 6 (six) hours as needed for nausea or vomiting., Disp: 20 tablet, Rfl: 1   ondansetron (ZOFRAN-ODT) 8 MG disintegrating tablet, Take 1 tablet (8 mg total) by mouth every 8 (eight) hours as needed for nausea or vomiting., Disp: 20 tablet, Rfl: 0   sucralfate (CARAFATE) 1 g tablet, Take 1 tablet (1 g total) by mouth every 6 (six) hours as needed. Slowly dissolve 1 tablet in 1 Tablespoon of distilled water prior to taking, Disp: 60 tablet, Rfl: 1   traZODone (DESYREL) 50 MG tablet, Take 2 tablets (100 mg total) by mouth at bedtime., Disp: 90 tablet,  Rfl: 2  Current Facility-Administered Medications:    betamethasone acetate-betamethasone sodium phosphate (CELESTONE) injection 12 mg, 12 mg, Other, Once, Magnus Sinning, MD   methylPREDNISolone acetate (DEPO-MEDROL) injection 80 mg, 80 mg, Other, Once, Magnus Sinning, MD   methylPREDNISolone acetate (DEPO-MEDROL) injection 80 mg, 80 mg, Other, Once, Magnus Sinning, MD Medication Side Effects: none  Family Medical/ Social History: Changes? No  MENTAL HEALTH EXAM:  There were no vitals taken for  this visit.There is no height or weight on file to calculate BMI.  General Appearance: Casual and Neat  Eye Contact:  Good  Speech:  Clear and Coherent  Volume:  Normal  Mood:  Angry  Affect:  Congruent and Depressed  Thought Process:  Coherent  Orientation:  Full (Time, Place, and Person)  Thought Content: Logical   Suicidal Thoughts:  No  Homicidal Thoughts:  No  Memory:  WNL  Judgement:  Good  Insight:  Fair  Psychomotor Activity:  Normal  Concentration:  Concentration: Good  Recall:  [place] of Knowledge: Good  Language: Good  Assets:  Desire for Improvement  ADL's:  Intact  Cognition: WNL  Prognosis:  Good    DIAGNOSES:    ICD-10-CM   1. Major depressive disorder, recurrent episode, moderate (HCC)  F33.1     2. Generalized anxiety disorder  F41.1     3. Attention deficit hyperactivity disorder (ADHD), predominantly inattentive type  F90.0     4. Insomnia due to other mental disorder  F51.05    F99       Receiving Psychotherapy: Karna Christmas Blauert   RECOMMENDATIONS:   Greater than 50% of 30 face to face time with patient was spent on counseling and coordination of care. We discussed her current stability. She has a lot of situational problems stemming from the relationship with her sister. She has a new dog now since her  dog "Sonny" passed. She is still grieving. Recommended grief counseling due to the level of stress We agreed to: Will continue Cymbalta 60 mg twice daily To continue Trazodone 50 mg at bedtime as reported by pt To continue Adderall 30 mg XR in the am and Adderall 30 mg IR in the afternoon as reported by pt she has been taking. Continue Buspar 3 tablets daily (45 mg total daily). At least 7-8 hours between doses. Continue Xanax 0.5 mg twice daily for severe anxiety only.  To report worsening symptoms or side effects promptly Will follow up in 6 weeks to reassess Provided emergency contact information Discussed potential metabolic side effects  associated with atypical antipsychotics, as well as potential risk for movement side effects. Advised pt to contact office if movement side effects occur.   Reviewed PDMP     Elwanda Brooklyn, NP

## 2021-10-31 NOTE — Progress Notes (Signed)
Jordan [name] [id number] 26-Jan-1961 61 y.o.  Virtual Visit via Video Note  I connected with pt @ on 10/31/21 at  1:30 PM EDT by a video enabled telemedicine application and verified that I am speaking with the correct person using two identifiers.   I discussed the limitations of evaluation and management by telemedicine and the availability of in person appointments. The patient expressed understanding and agreed to proceed.  I discussed the assessment and treatment plan with the patient. The patient was provided an opportunity to ask questions and all were answered. The patient agreed with the plan and demonstrated an understanding of the instructions.   The patient was advised to call back or seek an in-person evaluation if the symptoms worsen or if the condition fails to improve as anticipated.  I provided 30 minutes of non-face-to-face time during this encounter.  The patient was located at home.  The provider was located at [place].   Jordan Brooklyn, NP   Subjective:   Patient ID:  Jordan [name] is a 61 y.o. (DOB August 16, 1960) female.  Chief Complaint:  Chief Complaint  Patient presents with   Anxiety   Depression   Follow-up   Patient Education   Grief    HPI  61 year old patient presents to this office via video visit for  follow up and medication management. She is still  grieving with the loss of her pet dog  of 14 years. She recently acquired a new pet dog and says it is helping some. She is having more lower back pain and seeking care tomorrow. She has had previous epidural injections.  She says that she is not ready yet to wean down on xanax and says she would like to try next month. Says she sees a Air traffic controller in [place] and continues to see therapist here on regular basis. She denies any current or prior mania.No psychosis, no auditory or visual hallucinations. Strong family hx of dementia. No SI or HI.   Prior psychiatric  medication trials:   Abilify Latuda Risperdone Amitriptyline Trintellix Lexapro Prozac Zoloft Paxil Wellbutrin Remeron Effexor Cymbalta Rexulti  Review of Systems:  Review of Systems  Constitutional: Negative.   Musculoskeletal:  Positive for back pain.  Allergic/Immunologic: Negative.   Psychiatric/Behavioral:  Positive for dysphoric mood and sleep disturbance. The patient is nervous/anxious.     Medications: I have reviewed the patient's current medications.  Current Outpatient Medications  Medication Sig Dispense Refill   ALPRAZolam (XANAX) 0.5 MG tablet Take 1 tablet (0.5 mg total) by mouth 2 (two) times daily as needed for anxiety. 60 tablet 1   amphetamine-dextroamphetamine (ADDERALL XR) 30 MG 24 hr capsule Take 1 capsule (30 mg total) by mouth daily. 30 capsule 0   amphetamine-dextroamphetamine (ADDERALL) 20 MG tablet Take 1 tablet (20 mg total) by mouth daily. 30 tablet 0   busPIRone (BUSPAR) 15 MG tablet Take 1 tablet three times daily, at least 7-8 hours between doses (45 mg total daily). 90 tablet 3   cyclobenzaprine (FLEXERIL) 10 MG tablet TAKE ONE TABLET BY MOUTH EVERY NIGHT AT BEDTIME AS NEEDED FOR MUSCLE SPASMS 30 tablet 2   dicyclomine (BENTYL) 10 MG capsule Take 1 capsule (10 mg total) by mouth every 6 (six) hours as needed (abdominal pain). 30 capsule 0   DULoxetine (CYMBALTA) 60 MG capsule Take 1 capsule (60 mg total) by mouth 2 (two) times daily. 270 capsule 2   esomeprazole (NEXIUM) 40 MG capsule TAKE 1  CAPSULE BY MOUTH TWICE  DAILY BEFORE MEALS 180 capsule 3   ferrous sulfate 325 (65 FE) MG EC tablet Take 1 tablet (325 mg total) by mouth daily with breakfast. 30 tablet 0   fluticasone (FLONASE) 50 MCG/ACT nasal spray SPRAY ONE SPRAY IN EACH NOSTRIL ONCE DAILY AS NEEDED FOR ALLERGIES OR RHINITIS 16 mL 2   ondansetron (ZOFRAN-ODT) 4 MG disintegrating tablet Take 1 tablet (4 mg total) by mouth every 6 (six) hours as needed for nausea or vomiting. 20 tablet 1    ondansetron (ZOFRAN-ODT) 8 MG disintegrating tablet Take 1 tablet (8 mg total) by mouth every 8 (eight) hours as needed for nausea or vomiting. 20 tablet 0   sucralfate (CARAFATE) 1 g tablet Take 1 tablet (1 g total) by mouth every 6 (six) hours as needed. Slowly dissolve 1 tablet in 1 Tablespoon of distilled water prior to taking 60 tablet 1   traZODone (DESYREL) 50 MG tablet Take 2 tablets (100 mg total) by mouth at bedtime. 90 tablet 2   Current Facility-Administered Medications  Medication Dose Route Frequency Provider Last Rate Last Admin   betamethasone acetate-betamethasone sodium phosphate (CELESTONE) injection 12 mg  12 mg Other Once Magnus Sinning, MD       methylPREDNISolone acetate (DEPO-MEDROL) injection 80 mg  80 mg Other Once Magnus Sinning, MD       methylPREDNISolone acetate (DEPO-MEDROL) injection 80 mg  80 mg Other Once Magnus Sinning, MD        Medication Side Effects: None  Allergies:  Allergies  Allergen Reactions   Prednisone Rash    Face redness and swollen per patient   Pregabalin Other (See Comments)    Delusions   Remeron [Mirtazapine] Other (See Comments)    Severe hallucinations    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    Lump or mass in breast    Meningitis, unspecified(322.9)    Oral thrush    Other specified glaucoma    Perforation of tympanic membrane, unspecified    Radial styloid tenosynovitis     Seizures ([place])     Family History  Problem Relation Age of Onset   Dementia Mother    Alcohol abuse Mother    Ovarian cancer Mother    Heart disease Mother    Hypertension Mother    Coronary artery disease Mother    Colon polyps Mother 68   Heart failure Father    Hypertension Father    Hyperlipidemia Father    Diabetes Father    Alcohol abuse Sister    Anxiety disorder Maternal Aunt    Depression Maternal Aunt    Alcohol abuse Maternal Aunt    Alcohol abuse Maternal Uncle    Alcohol abuse Maternal Grandfather    Breast cancer Maternal Grandmother    Colon cancer Neg  Hx    Esophageal cancer Neg Hx    Stomach cancer Neg Hx    Rectal cancer Neg Hx     Social History   Socioeconomic History   Marital status: Divorced    Spouse name: Not on file   Number of children: 0   Years of education: Masters   Highest education level: Master's degree (e.g., MA, MS, MEng, MEd, MSW, MBA)  Occupational History   Occupation: part time Designer, industrial/product   Occupation: disability  Tobacco Use   Smoking status: Never   Smokeless tobacco: Never  Vaping Use   Vaping Use: Never used  Substance and Sexual Activity   Alcohol use: Yes    Alcohol/week: 7.0 standard drinks of alcohol    Types: 7 Glasses of wine per week    Comment: 2 glasses of wine per night   Drug use: No   Sexual activity: Not Currently  Other Topics Concern   Not on file  Social History Narrative   HS[employer], [employer] BA, App for MA. [place], roads[place] rail [place] land. 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 Married 5 years - divorced, no children   Patient is right handed   Patient drinks 1 cup of coffee daily.               Social Determinants of Health   Financial Resource Strain: Low Risk  (06/23/2021)   Overall Financial Resource Strain (CARDIA)    Difficulty of Paying Living Expenses: Not very hard  Food Insecurity: Food Insecurity Present (06/23/2021)   Hunger Vital  Sign    Worried About Running Out of Food in the Last Year: Never true    [place] in the Last Year: Sometimes true  Transportation Needs: No Transportation Needs (06/23/2021)   PRAPARE - Hydrologist (Medical): No    Lack of Transportation (Non-Medical): No  Physical Activity: Sufficiently Active (06/23/2021)   Exercise Vital Sign    Days of Exercise per Week: 7 days    Minutes of Exercise per Session: 30 min  Stress: Not on file  Social Connections: Socially Isolated (06/23/2021)   Social Connection and Isolation Panel [NHANES]    Frequency of Communication with Friends and Family: Once a week    Frequency of Social Gatherings with Friends and Family: Never    Attends Religious Services: Never    Marine scientist or Organizations: No    Attends Music therapist: Not on file    Marital Status: Divorced  Human resources officer Violence: Not on file    Past Medical History, Surgical history, Social history, and Family history were reviewed and updated as appropriate.   Please see review of systems for further details on the patient's review from today.   Objective:   Physical Exam:  There were no vitals taken for this visit.  Physical Exam Neurological:     Mental Status: She is alert and oriented to person, place, and time.  Psychiatric:        Attention and Perception: Attention and perception normal.        Mood and Affect: Mood normal.        Speech: Speech normal.        Behavior: Behavior normal. Behavior is cooperative.        Cognition and Memory: Cognition and memory normal.        Judgment: Judgment normal.  Comments: Insight intact     Lab Review:     Component Value Date/Time   NA 138 07/20/2021 1203   K 4.1 07/20/2021 1203   CL 106 07/20/2021 1203   CO2 23 07/20/2021 1203   GLUCOSE 94 07/20/2021 1203   BUN 25 (H) 07/20/2021 1203   CREATININE 1.01 (H) 07/20/2021 1203   CREATININE 0.95 10/22/2020 1500    CALCIUM 9.2 07/20/2021 1203   PROT 6.8 07/20/2021 1203   ALBUMIN 4.4 07/20/2021 1203   AST 19 07/20/2021 1203   ALT 21 07/20/2021 1203   ALKPHOS 85 07/20/2021 1203   BILITOT 0.4 07/20/2021 1203   GFRNONAA >60 07/20/2021 1203   GFRAA >90 08/28/2012 2359       Component Value Date/Time   WBC 8.7 10/06/2021 1626   RBC 4.80 10/06/2021 1626   HGB 14.5 10/06/2021 1626   HCT 42.9 10/06/2021 1626   PLT 212.0 10/06/2021 1626   MCV 89.5 10/06/2021 1626   MCH 26.0 07/20/2021 1203   MCHC 33.7 10/06/2021 1626   RDW 20.9 (H) 10/06/2021 1626   LYMPHSABS 1.7 10/06/2021 1626   MONOABS 0.6 10/06/2021 1626   EOSABS 0.4 10/06/2021 1626   BASOSABS 0.1 10/06/2021 1626    No results found for: "POCLITH", "LITHIUM"   No results found for: "PHENYTOIN", "PHENOBARB", "VALPROATE", "CBMZ"   .res Assessment: Plan:     RECOMMENDATIONS:    Greater than 50% of 30 face to face time with patient was spent on counseling and coordination of care. We discussed her current stability. She has a lot of situational problems stemming from the relationship with her sister. She has a new dog now since her  dog "Jordan [name]" passed. She is still grieving. Recommended grief counseling due to the level of stress We agreed to: Will continue Cymbalta 60 mg twice daily To continue Trazodone 50 mg at bedtime as reported by pt To continue Adderall 30 mg XR in the am and Adderall 30 mg IR in the afternoon as reported by pt she has been taking. Continue Buspar 3 tablets daily (45 mg total daily). At least 7-8 hours between doses. Continue Xanax 0.5 mg twice daily for severe anxiety only.  To report worsening symptoms or side effects promptly Will follow up in 6 weeks to reassess Provided emergency contact information Discussed potential metabolic side effects associated with atypical antipsychotics, as well as potential risk for movement side effects. Advised pt to contact office if movement side effects occur.   Reviewed  PDMP         Jordan Brooklyn, NP             Jordan [name] was seen today for anxiety, depression, follow-up, patient education and grief.  Diagnoses and all orders for this visit:  Major depressive disorder, recurrent episode, moderate (HCC)  Generalized anxiety disorder  Attention deficit hyperactivity disorder (ADHD), predominantly inattentive type  Insomnia due to other mental disorder     Please see After Visit Summary for patient specific instructions.  Future Appointments  Date Time Provider [place]  11/01/2021  1:00 PM [facility] 3 [facility]  11/01/2021  2:30 PM Lorine Bears, NP [facility] None  11/21/2021 12:00 PM Bauert, Nicolasa Ducking, LCSW [facility] None  12/08/2021  3:00 PM Bauert, Nicolasa Ducking, LCSW [facility] None  12/12/2021  1:00 PM Jordan Brooklyn, NP [facility] None  12/29/2021  3:00 PM Bauert, Terri W, LCSW [facility] None    No orders of the defined types were placed in  this encounter.     -------------------------------

## 2021-11-01 ENCOUNTER — Ambulatory Visit: Payer: Medicare Other | Admitting: Physical Medicine and Rehabilitation

## 2021-11-01 ENCOUNTER — Encounter: Payer: Self-pay | Admitting: Physical Medicine and Rehabilitation

## 2021-11-01 DIAGNOSIS — M47816 Spondylosis without myelopathy or radiculopathy, lumbar region: Secondary | ICD-10-CM | POA: Diagnosis not present

## 2021-11-01 DIAGNOSIS — M25552 Pain in left hip: Secondary | ICD-10-CM | POA: Diagnosis not present

## 2021-11-01 DIAGNOSIS — Z1231 Encounter for screening mammogram for malignant neoplasm of breast: Secondary | ICD-10-CM

## 2021-11-01 DIAGNOSIS — R1032 Left lower quadrant pain: Secondary | ICD-10-CM

## 2021-11-01 DIAGNOSIS — G8929 Other chronic pain: Secondary | ICD-10-CM | POA: Diagnosis not present

## 2021-11-01 DIAGNOSIS — M797 Fibromyalgia: Secondary | ICD-10-CM

## 2021-11-01 DIAGNOSIS — M545 Low back pain, unspecified: Secondary | ICD-10-CM | POA: Diagnosis not present

## 2021-11-01 NOTE — Progress Notes (Signed)
Patient states she has back pain with left sided radiculopathy; discuss shot vs. surgery

## 2021-11-01 NOTE — Progress Notes (Unsigned)
Jordan [name] Jordan [name] - 61 y.o. female MRN [id number]  Date of birth: 03/11/1960  Office Visit Note: Visit Date: 11/01/2021 PCP: Isaac Bliss, Rayford Halsted, MD Referred by: Isaac Bliss, Estel*  Subjective: Chief Complaint  Patient presents with   Lower Back - Follow-up   HPI: Jordan [name] is a 61 y.o. female who comes in today for evaluation of chronic, worsening and severe pain to left groin that radiates to hip. Ongoing for several months. Pain worsens with movement and activity, specifically walking. She describes pain as a sore and aching sensation, currently rates as 8 out of 10. Some relief of pain with home exercise regimen, rest and use of medications. Patient states she has recently started swimming at [facility] and reports this also helps with pain. Recent MRI imaging of pelvis exhibits left sided ischiofemoral impingement. Patient recently underwent left ischiofemoral impingement injection in our office on 08/16/2021 and reports significant and sustained pain relief for approximately 2 months. States pain has gradually started to return over the last several weeks. We have treated patient in the past for chronic lower back pain, we recently performed left L4-L5 and L5-S1 intra-articular facet joint injections in June, states good relief of pain with this procedure. Patient is waiting to hear from [facility] for surgical consultation regarding her back issues. Patient denies focal weakness, numbness and tingling. Patient denies recent trauma or falls.     Review of Systems  Musculoskeletal:  Positive for back pain and joint pain.  Neurological:  Negative for tingling, sensory change, focal weakness and weakness.  All other systems reviewed and are negative.  Otherwise per HPI.  Assessment & Plan: Visit Diagnoses:    ICD-10-CM   1. Pain in left hip  M25.552 Ambulatory referral to Physical Medicine Rehab    CANCELED: Ambulatory referral to  Physical Medicine Rehab    2. Groin pain, chronic, left  R10.32    G89.29     3. Fibromyalgia  M79.7 Ambulatory referral to Physical Medicine Rehab    CANCELED: Ambulatory referral to Physical Medicine Rehab    4. Chronic left-sided low back pain, unspecified whether sciatica present  M54.50 Ambulatory referral to Physical Medicine Rehab   G89.29 CANCELED: Ambulatory referral to Physical Medicine Rehab    5. Spondylosis without myelopathy or radiculopathy, lumbar region  M47.816 Ambulatory referral to Physical Medicine Rehab    CANCELED: Ambulatory referral to Physical Medicine Rehab       Plan: Findings:  Chronic, worsening and severe pain to left groin that radiates to hip. Patient continues to have severe pain despite good conservative therapies such as home exercise regimen, rest and use of medications. 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 Next step is to repeat left ischiofemoral impingement syndrome injection under fluoroscopic guidance. If she continues to get good relief with injection we can repeat infrequently as needed. If pain persists post injection we would need to consult with Dr. Ninfa Linden to see if patient has other treatment options. If her lower back pain returns we can repeat intra-articular facet injections infrequently as needed, we will look for note from neurosurgeon. No red flag symptoms noted upon exam today.     Meds & Orders: No orders of the defined types were placed in this encounter.   Orders Placed This Encounter  Procedures   Ambulatory referral to Physical Medicine Rehab    Follow-up: Return for Left ischiofemoral impingement  injection.   Procedures: No procedures performed      Clinical History: EXAM: MRI PELVIS WITHOUT AND WITH CONTRAST   TECHNIQUE: Multiplanar multisequence MR imaging of the pelvis was performed both before and after  administration of intravenous contrast.   CONTRAST:  34m MULTIHANCE GADOBENATE DIMEGLUMINE 529 MG/ML IV SOLN   COMPARISON:  Left hip MRI 12/20/2019   FINDINGS: Urinary Tract:  No abnormality visualized.   Bowel:  Sigmoid diverticulosis.   Vascular/Lymphatic: No pathologically enlarged lymph nodes. No significant vascular abnormality seen.   Reproductive:  Unremarkable.   Other:  None   Musculoskeletal: There is no evidence of acute fracture or avascular necrosis. No focal bone lesion. There is lower lumbar spine degenerative disc disease, as seen on prior lumbar spine MRI 11/09/2020. The sacroiliac joints demonstrate mild degenerative changes without bony erosion, joint effusion, or enhancement.   There is mild to moderate chondrosis of the hips with degenerative anterior superior labral tearing.   There is narrowing of the ischiofemoral space with soft tissue edema in the region of the quadratus femoris. (Axial T2 image 33). There is tendinosis and low-grade partial tearing of the left proximal hamstrings. Mild peritrochanteric edema bilaterally, left greater than right. The gluteal tendons are intact. The adductors are intact. No focal fluid collection.   IMPRESSION: Mild bilateral hip osteoarthritis with degenerative anterior superior labral tearing.   Findings of left-sided ischiofemoral impingement.   Tendinosis and low-grade partial tearing of the left proximal hamstrings.   Mild peritrochanteric edema bilaterally. No evidence of gluteal tendon tear.     Electronically Signed   By: JMaurine SimmeringM.D.   On: 06/24/2021 10:43   She reports that she has never smoked. She has never used smokeless tobacco.  Recent Labs    03/21/21 1106 09/26/21 1613  HGBA1C 6.1 5.4    Objective:  VS:  HT:    WT:   BMI:     BP:   HR: bpm  TEMP: ( )  RESP:  Physical Exam Vitals and nursing note reviewed.  HENT:     Head: Normocephalic and atraumatic.     Right Ear:  External ear normal.     Left Ear: External ear normal.     Nose: Nose normal.     Mouth/Throat:     Mouth: Mucous membranes are moist.  Eyes:     Extraocular Movements: Extraocular movements intact.  Cardiovascular:     Rate and Rhythm: Normal rate.     Pulses: Normal pulses.  Pulmonary:     Effort: Pulmonary effort is normal.  Abdominal:     General: Abdomen is flat. There is no distension.  Musculoskeletal:        General: Tenderness present.     Cervical back: Normal range of motion.     Comments: Pt rises from seated position to standing without difficulty. Concordant low back pain with facet loading, lumbar spine extension and rotation. Strong distal strength without clonus, no pain upon palpation of greater trochanters. Sensation intact bilaterally. Walks independently, gait steady.   Skin:    General: Skin is warm and dry.     Capillary Refill: Capillary refill takes less than 2 seconds.  Neurological:     General: No focal deficit present.     Mental Status: She is alert and oriented to person, place, and time.  Psychiatric:        Mood and Affect: Mood normal.        Behavior: Behavior normal.  Ortho Exam  Imaging: No results found.  Past Medical/Family/Surgical/Social History: Medications & Allergies reviewed per EMR, new medications updated. Patient Active Problem List   Diagnosis Date Noted   IGT (impaired glucose tolerance) 03/24/2021   Iron deficiency anemia 03/24/2021   Hyperlipidemia 03/24/2021   Low back pain 10/19/2020   Severe recurrent major depression without psychotic features ([place]) 09/27/2015    Class: Chronic   Fibromyalgia 04/03/2013   Back pain, lumbosacral 04/03/2013   Degeneration of cervical intervertebral disc 04/03/2013   Dyspepsia and disorder of function of stomach 03/16/2012   Routine health maintenance 01/03/2012   Mild cognitive impairment with memory loss 01/03/2012   Cough 07/03/2011   Bilateral dry eyes 05/13/2011   Back  pain, thoracic 06/15/2010   MULTIPLE SCLEROSIS, PROGRESSIVE/RELAPSING 06/02/2009   Incontinence without sensory awareness 06/01/2009   NASAL POLYP 05/14/2009   Panic disorder without agoraphobia 04/25/2007   Carpal tunnel syndrome 04/25/2007   NECK PAIN, CHRONIC 02/19/2007   HAY FEVER 02/18/2007   ANEMIA-NOS 12/05/2006   Depression, recurrent ([place]) 12/05/2006   GLAUCOMA NEC 12/05/2006   TYMPANIC MEMBRANE PERFORATION 12/05/2006   FIBROCYSTIC BREAST DISEASE 12/05/2006   DE QUERVAIN'S TENOSYNOVITIS 12/05/2006   Headache 12/05/2006   BULIMIA, HX OF 12/05/2006   ANOREXIA, HX OF 12/05/2006   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    Lump or mass in breast    Meningitis, unspecified(322.9)    Oral thrush    Other specified glaucoma    Perforation of tympanic membrane, unspecified    Radial styloid tenosynovitis    Seizures ([place])    Family History  Problem Relation Age of Onset   Dementia Mother    Alcohol abuse Mother    Ovarian cancer Mother    Heart disease Mother    Hypertension Mother    Coronary artery disease Mother    Colon polyps Mother 22   Heart failure Father    Hypertension Father    Hyperlipidemia Father    Diabetes Father    Alcohol abuse Sister    Anxiety disorder Maternal Aunt    Depression Maternal Aunt    Alcohol abuse Maternal Aunt    Alcohol abuse Maternal Uncle    Alcohol abuse Maternal Grandfather    Breast  cancer Maternal Grandmother    Colon cancer Neg Hx    Esophageal cancer Neg Hx    Stomach cancer Neg Hx    Rectal cancer Neg Hx    Past Surgical History:  Procedure Laterality Date   ADENOIDECTOMY     ANKLE SURGERY  1992   right ankle - scar tissue   DILATION AND CURETTAGE OF UTERUS  2006   History of Menometrorrhagia   INNER EAR SURGERY  1966 to 1986   6 surgeries on right ear   LAPAROSCOPY  1993  normal   REDUCTION MAMMAPLASTY Bilateral 2018   uterine ablation  2011   WRIST SURGERY  2001   for de-quervaine - bilateral wrists - tendonitis   Social History   Occupational History   Occupation: part time Designer, industrial/product   Occupation: disability  Tobacco Use   Smoking status: Never   Smokeless tobacco: Never  Vaping Use   Vaping Use: Never used  Substance and Sexual Activity   Alcohol use: Yes    Alcohol/week: 7.0 standard drinks of alcohol    Types: 7 Glasses of wine per week    Comment: 2 glasses of wine per night   Drug use: No   Sexual activity: Not Currently

## 2021-11-03 ENCOUNTER — Encounter: Payer: Self-pay | Admitting: Physical Medicine and Rehabilitation

## 2021-11-06 ENCOUNTER — Encounter: Payer: Self-pay | Admitting: Orthopaedic Surgery

## 2021-11-07 ENCOUNTER — Telehealth: Payer: Self-pay | Admitting: Behavioral Health

## 2021-11-07 ENCOUNTER — Other Ambulatory Visit: Payer: Self-pay

## 2021-11-07 DIAGNOSIS — F900 Attention-deficit hyperactivity disorder, predominantly inattentive type: Secondary | ICD-10-CM

## 2021-11-07 DIAGNOSIS — M25552 Pain in left hip: Secondary | ICD-10-CM

## 2021-11-07 MED ORDER — AMPHETAMINE-DEXTROAMPHET ER 30 MG PO CP24: 30.0000 mg | ORAL_CAPSULE | Freq: Every day | ORAL | 0 refills | Status: DC

## 2021-11-07 MED ORDER — AMPHETAMINE-DEXTROAMPHETAMINE 20 MG PO TABS: 20.0000 mg | ORAL_TABLET | Freq: Every day | ORAL | 0 refills | Status: DC

## 2021-11-07 NOTE — Telephone Encounter (Signed)
Jordan [name] called today at 10:00 to request refill of both her Adderalls - XR '30mg'$  capsule and '20mg'$  tablet.  Appt 10/23.  Send to [facility] on [address].  They have them.

## 2021-11-07 NOTE — Telephone Encounter (Signed)
Pended.

## 2021-11-15 ENCOUNTER — Ambulatory Visit
Admission: RE | Admit: 2021-11-15 | Discharge: 2021-11-15 | Disposition: A | Discharge status: Home / Self Care | Payer: Medicare Other | Source: Ambulatory Visit | Attending: Internal Medicine | Admitting: Internal Medicine

## 2021-11-15 DIAGNOSIS — Z1231 Encounter for screening mammogram for malignant neoplasm of breast: Secondary | ICD-10-CM | POA: Diagnosis not present

## 2021-11-16 DIAGNOSIS — D225 Melanocytic nevi of trunk: Secondary | ICD-10-CM | POA: Diagnosis not present

## 2021-11-16 DIAGNOSIS — L821 Other seborrheic keratosis: Secondary | ICD-10-CM | POA: Diagnosis not present

## 2021-11-17 ENCOUNTER — Ambulatory Visit: Payer: Self-pay

## 2021-11-17 ENCOUNTER — Ambulatory Visit: Payer: Medicare Other | Admitting: Physical Medicine and Rehabilitation

## 2021-11-17 DIAGNOSIS — M25552 Pain in left hip: Secondary | ICD-10-CM

## 2021-11-17 DIAGNOSIS — G8929 Other chronic pain: Secondary | ICD-10-CM | POA: Diagnosis not present

## 2021-11-17 DIAGNOSIS — M545 Low back pain, unspecified: Secondary | ICD-10-CM | POA: Diagnosis not present

## 2021-11-17 NOTE — Progress Notes (Signed)
Numeric Pain Rating Scale and Functional Assessment Average Pain 10   In the last MONTH (on 0-10 scale) has pain interfered with the following?  1. General activity like being  able to carry out your everyday physical activities such as walking, climbing stairs, carrying groceries, or moving a chair?  Rating(8)   -Driver, -BT, -Dye Allergies.  147/78

## 2021-11-18 ENCOUNTER — Encounter: Payer: Self-pay | Admitting: Physical Medicine and Rehabilitation

## 2021-11-18 MED ORDER — TRIAMCINOLONE ACETONIDE 40 MG/ML IJ SUSP
40.0000 mg | INTRAMUSCULAR | Status: AC | PRN
  Administered 2021-11-17: 40 mg via INTRA_ARTICULAR

## 2021-11-18 MED ORDER — BUPIVACAINE HCL 0.25 % IJ SOLN
4.0000 mL | INTRAMUSCULAR | Status: AC | PRN
  Administered 2021-11-17: 4 mL via INTRA_ARTICULAR

## 2021-11-18 NOTE — Progress Notes (Signed)
Jordan [name] - 61 y.o. female MRN [id number]  Date of birth: April 07, 1960  Office Visit Note: Visit Date: 11/17/2021 PCP: Isaac Bliss, Rayford Halsted, MD Referred by: Isaac Bliss, Estel*  Subjective: Chief Complaint  Patient presents with   Left Hip - Pain   HPI:  Jordan [name] is a 61 y.o. female who comes in today for planned repeat Left ischiofemoral impingement injection (quadratus femoris) with fluoroscopic guidance.  The patient has failed conservative care including home exercise, medications, time and activity modification.  This injection will be diagnostic and hopefully therapeutic.  Please see requesting physician notes for further details and justification.   ROS Otherwise per HPI.  Assessment & Plan: Visit Diagnoses:    ICD-10-CM   1. Pain in left hip  M25.552 XR C-ARM NO REPORT    2. Chronic left-sided low back pain, unspecified whether sciatica present  M54.50    G89.29       Plan: No additional findings.   Meds & Orders: No orders of the defined types were placed in this encounter.   Orders Placed This Encounter  Procedures   Large Joint Inj   XR C-ARM NO REPORT    Follow-up: Return if symptoms worsen or fail to improve.   Procedures: Left Ischiofemoral (quadratus femoris) on 11/17/2021 1:15 PM Indications: diagnostic evaluation and pain Details: 22 G 3.5 in needle, fluoroscopy-guided posterior approach  Arthrogram: No  Medications: 4 mL bupivacaine 0.25 %; 40 mg triamcinolone acetonide 40 MG/ML Outcome: tolerated well, no immediate complications  Biplanar imaging used to place needle just lateral to the lesser trochanter. Contrast showed no vascular flow. Procedure, treatment alternatives, risks and benefits explained, specific risks discussed. Consent was given by the patient. Immediately prior to procedure a time out was called to verify the correct patient, procedure, equipment, support staff and site/side marked as  required. Patient was prepped and draped in the usual sterile fashion.          Clinical History: EXAM: MRI PELVIS WITHOUT AND WITH CONTRAST   TECHNIQUE: Multiplanar multisequence MR imaging of the pelvis was performed both before and after administration of intravenous contrast.   CONTRAST:  32m MULTIHANCE GADOBENATE DIMEGLUMINE 529 MG/ML IV SOLN   COMPARISON:  Left hip MRI 12/20/2019   FINDINGS: Urinary Tract:  No abnormality visualized.   Bowel:  Sigmoid diverticulosis.   Vascular/Lymphatic: No pathologically enlarged lymph nodes. No significant vascular abnormality seen.   Reproductive:  Unremarkable.   Other:  None   Musculoskeletal: There is no evidence of acute fracture or avascular necrosis. No focal bone lesion. There is lower lumbar spine degenerative disc disease, as seen on prior lumbar spine MRI 11/09/2020. The sacroiliac joints demonstrate mild degenerative changes without bony erosion, joint effusion, or enhancement.   There is mild to moderate chondrosis of the hips with degenerative anterior superior labral tearing.   There is narrowing of the ischiofemoral space with soft tissue edema in the region of the quadratus femoris. (Axial T2 image 33). There is tendinosis and low-grade partial tearing of the left proximal hamstrings. Mild peritrochanteric edema bilaterally, left greater than right. The gluteal tendons are intact. The adductors are intact. No focal fluid collection.   IMPRESSION: Mild bilateral hip osteoarthritis with degenerative anterior superior labral tearing.   Findings of left-sided ischiofemoral impingement.   Tendinosis and low-grade partial tearing of the left proximal hamstrings.   Mild peritrochanteric edema bilaterally. No evidence of gluteal tendon tear.     Electronically Signed  By: Maurine Simmering M.D.   On: 06/24/2021 10:43     Objective:  VS:  HT:    WT:   BMI:     BP:   HR: bpm  TEMP: ( )  RESP:   Physical Exam   Imaging: No results found.

## 2021-11-19 ENCOUNTER — Other Ambulatory Visit: Payer: Self-pay | Admitting: Behavioral Health

## 2021-11-19 DIAGNOSIS — F900 Attention-deficit hyperactivity disorder, predominantly inattentive type: Secondary | ICD-10-CM

## 2021-11-19 DIAGNOSIS — F411 Generalized anxiety disorder: Secondary | ICD-10-CM

## 2021-11-19 DIAGNOSIS — F331 Major depressive disorder, recurrent, moderate: Secondary | ICD-10-CM

## 2021-11-21 ENCOUNTER — Ambulatory Visit: Payer: Medicare Other | Admitting: Psychology

## 2021-11-23 ENCOUNTER — Other Ambulatory Visit: Payer: Self-pay

## 2021-11-23 ENCOUNTER — Telehealth: Payer: Self-pay | Admitting: Behavioral Health

## 2021-11-23 DIAGNOSIS — F411 Generalized anxiety disorder: Secondary | ICD-10-CM

## 2021-11-23 DIAGNOSIS — F900 Attention-deficit hyperactivity disorder, predominantly inattentive type: Secondary | ICD-10-CM

## 2021-11-23 DIAGNOSIS — F331 Major depressive disorder, recurrent, moderate: Secondary | ICD-10-CM

## 2021-11-23 MED ORDER — ALPRAZOLAM 0.5 MG PO TABS: 0.5000 mg | ORAL_TABLET | Freq: Two times a day (BID) | ORAL | 1 refills | Status: DC | PRN

## 2021-11-23 NOTE — Telephone Encounter (Signed)
Pt lvm that she needs a refill on her xanax. [facility] on [address]. Next appt 10/27

## 2021-11-23 NOTE — Telephone Encounter (Signed)
Pended.

## 2021-11-25 ENCOUNTER — Telehealth: Payer: Self-pay | Admitting: Behavioral Health

## 2021-11-25 ENCOUNTER — Encounter: Payer: Self-pay | Admitting: Physical Medicine and Rehabilitation

## 2021-11-25 NOTE — Telephone Encounter (Signed)
Yes, noted but best addressed during appt due to complexity. Thanks

## 2021-11-25 NOTE — Telephone Encounter (Signed)
Patient lvm stating she is having some physical issues. She stated her physicians are questioning her Adderall usage. Patient is inquiring if she can discuss an alternative.  Appointment 12/12/21 Contact information # [phone]

## 2021-11-25 NOTE — Telephone Encounter (Signed)
Returned patient's call and she said that she wants to explore other options besides Adderall. She said she stays hot all the time and her GYN thought the Adderall may be contributing. She said her PCP and Ortho have mentioned to her that it may be time for a change since she had been on it for so long. She is only taking 10 mg in the afternoon because she can't get to sleep otherwise. Her depression seems to be worse, though she said this year had been tough medically and mentally. She recently lost her dog. She said she has organizational issues and can't keep her thoughts together. She said that at her initial visit you had mentioned possibly putting her on another medication. She is not asking for a change currently. She has an appt with you on 10/23 and said this could be addressed at that appointment, but was afraid she would forget to bring it up.

## 2021-12-01 DIAGNOSIS — M76892 Other specified enthesopathies of left lower limb, excluding foot: Secondary | ICD-10-CM | POA: Diagnosis not present

## 2021-12-02 ENCOUNTER — Encounter: Payer: Self-pay | Admitting: Orthopaedic Surgery

## 2021-12-02 ENCOUNTER — Encounter: Payer: Self-pay | Admitting: Physical Medicine and Rehabilitation

## 2021-12-02 ENCOUNTER — Encounter: Payer: Self-pay | Admitting: Internal Medicine

## 2021-12-06 ENCOUNTER — Telehealth: Payer: Self-pay | Admitting: Behavioral Health

## 2021-12-06 ENCOUNTER — Other Ambulatory Visit: Payer: Self-pay

## 2021-12-06 DIAGNOSIS — F900 Attention-deficit hyperactivity disorder, predominantly inattentive type: Secondary | ICD-10-CM

## 2021-12-06 MED ORDER — AMPHETAMINE-DEXTROAMPHETAMINE 20 MG PO TABS: 20.0000 mg | ORAL_TABLET | Freq: Every day | ORAL | 0 refills | Status: DC

## 2021-12-06 MED ORDER — AMPHETAMINE-DEXTROAMPHET ER 30 MG PO CP24: 30.0000 mg | ORAL_CAPSULE | Freq: Every day | ORAL | 0 refills | Status: DC

## 2021-12-06 NOTE — Telephone Encounter (Signed)
Pended.

## 2021-12-06 NOTE — Telephone Encounter (Signed)
Pt requesting Rx for generic adderall 30 mg XR and 20 mg IR. HT Herbie Drape in stock. Apt 10/23

## 2021-12-08 ENCOUNTER — Ambulatory Visit (INDEPENDENT_AMBULATORY_CARE_PROVIDER_SITE_OTHER): Payer: Medicare Other | Admitting: Psychology

## 2021-12-08 DIAGNOSIS — F331 Major depressive disorder, recurrent, moderate: Secondary | ICD-10-CM

## 2021-12-08 NOTE — Progress Notes (Signed)
[place] Counselor/Therapist Progress Note  Patient ID: Jordan [name], MRN: [id number],    Date: 12/08/2021  Time Spent: 3:00pm-3:45pm   45 minutes   Treatment Type: Individual Therapy  Reported Symptoms: sadness  Mental Status Exam: Appearance:  Casual     Behavior: Appropriate  Motor: Normal  Speech/Language:  Normal Rate  Affect: Appropriate  Mood: normal  Thought process: normal  Thought content:   WNL  Sensory/Perceptual disturbances:   WNL  Orientation: oriented to person, place, time/date, and situation  Attention: Good  Concentration: Good  Memory: WNL  Fund of knowledge:  Good  Insight:   Good  Judgment:  Good  Impulse Control: Good   Risk Assessment: Danger to Self:  No Self-injurious Behavior: No Danger to Others: No Duty to Warn:no Physical Aggression / Violence:No  Access to Firearms a concern: No  Gang Involvement:No   Subjective:  Pt present for face-to-face individual therapy via video Webex.  Pt consents to telehealth video session due to COVID 19 pandemic. Location of pt: home Location of therapist: home office.   Pt talked about having her new dog Piper.  When she first adopted Piper it was hard to still deal with the grief of the loss of her dog Sunny. Pt states that Tarry Kos was given to her to save her life and Piper was given to her to help her enjoy life and take care of her.    Piper has bonded well to pt.  Pt states Piper is a very good dog and is acclimating well to pt's home.   Pt talked about going to [facility] to have her hip evaluated.  She is going to have surgery for nerve impingement.  The rehab will take about 8 weeks.   Pt talked about increased crime in her neighborhood which is concerning for her.  She talked to the police who told her her neighborhood is low priority.  This was very upsetting to pt.  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   She looks out for the safety of everyone.  There are some neighborhood  dynamics bc of a couple of people who do not like pt.   Provided supportive therapy.  Interventions: Cognitive Behavioral Therapy and Insight-Oriented  Diagnosis:F33.1  Plan of Care: Recommend ongoing therapy.  Pt participated in setting treatment goals.  She wants to improve coping skills and self esteem.   Plan to meet every couple of weeks.    Treatment Plan (Treatment Plan Target Date:  03/24/2022) Client Abilities/Strengths  Pt is bright, engaging, and motivated for therapy.   Client Treatment Preferences  Individual therapy.  Client Statement of Needs  Improve coping skills.  Symptoms  Depressed or irritable mood. Feelings of hopelessness, worthlessness, or inappropriate guilt. Low self-esteem. Unresolved grief issues.   Problems Addressed  Unipolar Depression Goals 1. Alleviate depressive symptoms and return to previous level of effective functioning. 2. Appropriately grieve the loss in order to normalize mood and to return to previously adaptive level of functioning. Objective Learn and implement behavioral strategies to overcome depression. Target Date: 2022-03-24 Frequency: Biweekly  Progress: 10 Modality: individual  Related Interventions Engage the client in "behavioral activation," increasing his/her activity level and contact with sources of reward, while identifying processes that inhibit activation.  Use behavioral techniques such as instruction, rehearsal, role-playing, role reversal, as needed, to facilitate activity in the client's daily life; reinforce success. Assist the client in developing skills that increase the likelihood of deriving pleasure from behavioral activation (  e.g., assertiveness skills, developing an exercise plan, less internal/more external focus, increased social involvement); reinforce success. Objective Identify important people in life, past and present, and describe the quality, good and poor, of those relationships. Target Date:  2022-03-24 Frequency: Biweekly  Progress: 10 Modality: individual  Related Interventions Conduct Interpersonal Therapy beginning with the assessment of the client's "interpersonal inventory" of important past and present relationships; develop a case formulation linking depression to grief, interpersonal role disputes, role transitions, and/or interpersonal deficits). Objective Learn and implement problem-solving and decision-making skills. Target Date: 2022-03-24 Frequency: Biweekly  Progress: 10 Modality: individual  Related Interventions Conduct Problem-Solving Therapy using techniques such as psychoeducation, modeling, and role-playing to teach client problem-solving skills (i.e., defining a problem specifically, generating possible solutions, evaluating the pros and cons of each solution, selecting and implementing a plan of action, evaluating the efficacy of the plan, accepting or revising the plan); role-play application of the problem-solving skill to a real life issue. Encourage in the client the development of a positive problem orientation in which problems and solving them are viewed as a natural part of life and not something to be feared, despaired, or avoided. 3. Develop healthy interpersonal relationships that lead to the alleviation and help prevent the relapse of depression. 4. Develop healthy thinking patterns and beliefs about self, others, and the world that lead to the alleviation and help prevent the relapse of depression. 5. Recognize, accept, and cope with feelings of depression. Diagnosis F33.1  Conditions For Discharge Achievement of treatment goals and objectives     Clint Bolder, LCSW

## 2021-12-12 ENCOUNTER — Ambulatory Visit: Payer: Medicare Other | Admitting: Behavioral Health

## 2021-12-13 ENCOUNTER — Encounter: Payer: Self-pay | Admitting: Internal Medicine

## 2021-12-13 ENCOUNTER — Other Ambulatory Visit: Payer: Self-pay | Admitting: Gastroenterology

## 2021-12-13 MED ORDER — DICYCLOMINE HCL 10 MG PO CAPS: 10.0000 mg | ORAL_CAPSULE | Freq: Four times a day (QID) | ORAL | 1 refills | Status: DC | PRN

## 2021-12-14 ENCOUNTER — Telehealth: Payer: Self-pay | Admitting: Physical Medicine and Rehabilitation

## 2021-12-14 ENCOUNTER — Encounter: Payer: Self-pay | Admitting: Physical Medicine and Rehabilitation

## 2021-12-14 NOTE — Telephone Encounter (Signed)
Sent pt mychart message

## 2021-12-14 NOTE — Telephone Encounter (Signed)
Pt call requesting an appt right away appt wit Dr Ernestina Patches.Catalina Gravel she is having severe pains in back and would like a back injection. Pt phone number is [phone]

## 2021-12-25 ENCOUNTER — Other Ambulatory Visit: Payer: Self-pay | Admitting: Behavioral Health

## 2021-12-25 DIAGNOSIS — F331 Major depressive disorder, recurrent, moderate: Secondary | ICD-10-CM

## 2021-12-25 DIAGNOSIS — F900 Attention-deficit hyperactivity disorder, predominantly inattentive type: Secondary | ICD-10-CM

## 2021-12-25 DIAGNOSIS — F411 Generalized anxiety disorder: Secondary | ICD-10-CM

## 2021-12-28 ENCOUNTER — Encounter: Payer: Self-pay | Admitting: Internal Medicine

## 2021-12-28 NOTE — Telephone Encounter (Signed)
Spoke with patient and rescheduled to 01/05/22

## 2021-12-28 NOTE — Telephone Encounter (Signed)
Pt called in stating that she needs to reschedule her appointment for tomorrow with Dr. Ernestina Patches... Pt stated the reason for cancellation is because her dog is having emergency surgery... Pt requesting reschedule... Pt requesting callback...Marland KitchenMarland Kitchen

## 2021-12-29 ENCOUNTER — Ambulatory Visit: Payer: Medicare Other | Admitting: Physical Medicine and Rehabilitation

## 2021-12-29 ENCOUNTER — Ambulatory Visit: Payer: Medicare Other | Admitting: Psychology

## 2022-01-02 ENCOUNTER — Telehealth: Payer: Self-pay | Admitting: Behavioral Health

## 2022-01-02 NOTE — Telephone Encounter (Signed)
Next visit is 01/04/22. Jordan [name] called to inform that she has started tapering off of the Adderall and doing very well with it.

## 2022-01-02 NOTE — Telephone Encounter (Signed)
FYI

## 2022-01-04 ENCOUNTER — Ambulatory Visit: Payer: Medicare Other | Admitting: Behavioral Health

## 2022-01-04 ENCOUNTER — Encounter: Payer: Self-pay | Admitting: Behavioral Health

## 2022-01-04 DIAGNOSIS — F5105 Insomnia due to other mental disorder: Secondary | ICD-10-CM

## 2022-01-04 DIAGNOSIS — F331 Major depressive disorder, recurrent, moderate: Secondary | ICD-10-CM | POA: Diagnosis not present

## 2022-01-04 DIAGNOSIS — F411 Generalized anxiety disorder: Secondary | ICD-10-CM | POA: Diagnosis not present

## 2022-01-04 DIAGNOSIS — F900 Attention-deficit hyperactivity disorder, predominantly inattentive type: Secondary | ICD-10-CM

## 2022-01-04 DIAGNOSIS — F99 Mental disorder, not otherwise specified: Secondary | ICD-10-CM

## 2022-01-04 MED ORDER — AMPHETAMINE-DEXTROAMPHETAMINE 20 MG PO TABS: 20.0000 mg | ORAL_TABLET | Freq: Every day | ORAL | 0 refills | Status: DC

## 2022-01-04 NOTE — Progress Notes (Signed)
[facility]  Patient ID: Jordan [name],  MRN: [id number]  PCP: Isaac Bliss, Rayford Halsted, MD  Date of Evaluation: 01/04/2022 Time spent:30 minutes  Chief Complaint:  Chief Complaint   Anxiety; Depression; ADHD; Follow-up; Medication Refill; Patient Education; Medication Problem     HISTORY/CURRENT STATUS: HPI  60 year old patient presents to this office face to face visit for  follow up and medication management. Doing much better with grief after loss of her dog. New dog is doing well. She would like to continuing weaning off her Adderall. She understand that stimulants and benzo's together is not recommended.  She is having more lower back pain and seeking care tomorrow. She has had previous epidural injections.  Still consulting with specialist about hip replacement. Says her depression today is 3/10 and anxiety 2/10. She is sleeping 7-8 hours per night. She denies any current or prior mania.No psychosis, no auditory or visual hallucinations. Strong family hx of dementia. No SI or HI.   Prior psychiatric medication trials:   Abilify Latuda Risperdone Amitriptyline Trintellix Lexapro Prozac Zoloft Paxil Wellbutrin Remeron Effexor Cymbalta Rexulti    Individual Medical History/ Review of Systems: Changes? :No   Allergies: Prednisone, Pregabalin, and Remeron [mirtazapine]  Current Medications:  Current Outpatient Medications:    amphetamine-dextroamphetamine (ADDERALL) 20 MG tablet, Take 1 tablet (20 mg total) by mouth daily., Disp: 30 tablet, Rfl: 0   ALPRAZolam (XANAX) 0.5 MG tablet, Take 1 tablet (0.5 mg total) by mouth 2 (two) times daily as needed for anxiety., Disp: 60 tablet, Rfl: 1   amphetamine-dextroamphetamine (ADDERALL) 20 MG tablet, Take 1 tablet (20 mg total) by mouth daily., Disp: 30 tablet, Rfl: 0   busPIRone (BUSPAR) 15 MG tablet, Take 1 tablet three times daily, at least 7-8 hours between doses (45 mg total daily)., Disp:  90 tablet, Rfl: 3   dicyclomine (BENTYL) 10 MG capsule, Take 1 capsule (10 mg total) by mouth every 6 (six) hours as needed (abdominal pain)., Disp: 30 capsule, Rfl: 1   DULoxetine (CYMBALTA) 60 MG capsule, Take 1 capsule (60 mg total) by mouth 2 (two) times daily., Disp: 270 capsule, Rfl: 2   esomeprazole (NEXIUM) 40 MG capsule, TAKE 1 CAPSULE BY MOUTH TWICE  DAILY BEFORE MEALS, Disp: 180 capsule, Rfl: 3   ferrous sulfate 325 (65 FE) MG EC tablet, Take 1 tablet (325 mg total) by mouth daily with breakfast., Disp: 30 tablet, Rfl: 0   fluticasone (FLONASE) 50 MCG/ACT nasal spray, SPRAY ONE SPRAY IN EACH NOSTRIL ONCE DAILY AS NEEDED FOR ALLERGIES OR RHINITIS, Disp: 16 mL, Rfl: 2   ondansetron (ZOFRAN-ODT) 4 MG disintegrating tablet, Take 1 tablet (4 mg total) by mouth every 6 (six) hours as needed for nausea or vomiting., Disp: 20 tablet, Rfl: 1   ondansetron (ZOFRAN-ODT) 8 MG disintegrating tablet, Take 1 tablet (8 mg total) by mouth every 8 (eight) hours as needed for nausea or vomiting., Disp: 20 tablet, Rfl: 0   sucralfate (CARAFATE) 1 g tablet, Take 1 tablet (1 g total) by mouth every 6 (six) hours as needed. Slowly dissolve 1 tablet in 1 Tablespoon of distilled water prior to taking, Disp: 60 tablet, Rfl: 1   traZODone (DESYREL) 50 MG tablet, TAKE 2 TABLETS BY MOUTH AT  BEDTIME, Disp: 90 tablet, Rfl: 0  Current Facility-Administered Medications:    betamethasone acetate-betamethasone sodium phosphate (CELESTONE) injection 12 mg, 12 mg, Other, Once, Magnus Sinning, MD   methylPREDNISolone acetate (DEPO-MEDROL) injection 80 mg, 80 mg, Other,  Once, Magnus Sinning, MD   methylPREDNISolone acetate (DEPO-MEDROL) injection 80 mg, 80 mg, Other, Once, Magnus Sinning, MD Medication Side Effects: none  Family Medical/ Social History: Changes? No  MENTAL HEALTH EXAM:  There were no vitals taken for this visit.There is no height or weight on file to calculate BMI.  General Appearance: Casual and  Well Groomed  Eye Contact:  Good  Speech:  Clear and Coherent  Volume:  Normal  Mood:  Anxious  Affect:  Appropriate  Thought Process:  Coherent  Orientation:  Full (Time, Place, and Person)  Thought Content: Logical   Suicidal Thoughts:  No  Homicidal Thoughts:  No  Memory:  WNL  Judgement:  Good  Insight:  Good  Psychomotor Activity:  Normal  Concentration:  Concentration: Good  Recall:  Good  Fund of Knowledge: Good  Language: Good  Assets:  Desire for Improvement  ADL's:  Intact  Cognition: WNL  Prognosis:  Good    DIAGNOSES:    ICD-10-CM   1. Major depressive disorder, recurrent episode, moderate (HCC)  F33.1     2. Generalized anxiety disorder  F41.1     3. Attention deficit hyperactivity disorder (ADHD), predominantly inattentive type  F90.0 amphetamine-dextroamphetamine (ADDERALL) 20 MG tablet    4. Insomnia due to other mental disorder  F51.05    F99       Receiving Psychotherapy: No    RECOMMENDATIONS:   Greater than 50% of 30 face to face time with patient was spent on counseling and coordination of care. We discussed her current stability. She says that she does better in the winter and is currently doing well. She would like to continue to wean off her Adderall. I did counsel her that she really needed to choose path of therapy and that I did not recommend continuing both Adderall and Benzo's concurrently. She present to me as new patient on both of these meds. Also educated her  Xanax, muscle relaxer are CNS depressant. No ETOH recommended with these medications. Goal is to gradually wean of the Adderall. Will reduce this visit.  We agreed to: Will continue Cymbalta 60 mg twice daily To continue Trazodone 50 mg at bedtime as reported by pt To reduce Adderall to 20 mg IR I the am. Continue Buspar 3 tablets daily (45 mg total daily). At least 7-8 hours between doses. Continue Xanax 0.5 mg twice daily for severe anxiety only.  To report worsening symptoms  or side effects promptly Will follow up in 8 weeks to reassess Provided emergency contact information Discussed potential metabolic side effects associated with atypical antipsychotics, as well as potential risk for movement side effects. Advised pt to contact office if movement side effects occur.   Reviewed PDMP          Elwanda Brooklyn, NP

## 2022-01-05 ENCOUNTER — Ambulatory Visit: Payer: Self-pay

## 2022-01-05 ENCOUNTER — Ambulatory Visit (INDEPENDENT_AMBULATORY_CARE_PROVIDER_SITE_OTHER): Payer: Medicare Other | Admitting: Physical Medicine and Rehabilitation

## 2022-01-05 DIAGNOSIS — M25552 Pain in left hip: Secondary | ICD-10-CM

## 2022-01-05 NOTE — Progress Notes (Signed)
Left Ischiofemoral (quadratus femoris) planned injection today. Patient states this is the 3rd injection.  First provided great relief for a long time, second only 3 weeks relief.  She did mention she he has seen surgeon at [facility] and has been approved for surgery but cannot get this done until March

## 2022-01-05 NOTE — Progress Notes (Signed)
Jordan [name] Jordan [name] - 61 y.o. female MRN [id number]  Date of birth: [birth year]  Office Visit Note: Visit Date: 01/05/2022 PCP: Isaac Bliss, Rayford Halsted, MD Referred by: Isaac Bliss, Estel*  Subjective: Chief Complaint  Patient presents with   Other    Left Ischiofemoral (quadratus femoris)   HPI:  Jordan [name] is a 61 y.o. female who comes in today for planned repeat Left ischiofemoral/gluteus medius injection with fluoroscopic guidance.  The patient has failed conservative care including home exercise, medications, time and activity modification. Prior injection gave more than 50% relief for several months. This injection will be diagnostic and hopefully therapeutic.  Please see requesting physician notes for further details and justification.  Referring: Dr. Jean Rosenthal   ROS Otherwise per HPI.  Assessment & Plan: Visit Diagnoses:    ICD-10-CM   1. Pain in left hip  M25.552 Large Joint Inj: L hip joint    XR C-ARM NO REPORT      Plan: No additional findings.   Meds & Orders: No orders of the defined types were placed in this encounter.   Orders Placed This Encounter  Procedures   Large Joint Inj: L hip joint   XR C-ARM NO REPORT    Follow-up: No follow-ups on file.   Procedures: Large Joint Inj: L hip joint on 01/05/2022 9:37 AM Indications: pain and diagnostic evaluation Details: 22 G 3.5 in needle, fluoroscopy-guided posterior approach  Arthrogram: No  Medications: 4 mL bupivacaine 0.25 %; 40 mg triamcinolone acetonide 40 MG/ML Outcome: tolerated well, no immediate complications  There was excellent flow of contrast outlining the left lesser trochanter and gluteus medius..  There was no vascular flow noted. Procedure, treatment alternatives, risks and benefits explained, specific risks discussed. Consent was given by the patient. Immediately prior to procedure a time out was called to verify the correct patient, procedure,  equipment, support staff and site/side marked as required. Patient was prepped and draped in the usual sterile fashion.          Clinical History: EXAM: MRI PELVIS WITHOUT AND WITH CONTRAST   TECHNIQUE: Multiplanar multisequence MR imaging of the pelvis was performed both before and after administration of intravenous contrast.   CONTRAST:  20m MULTIHANCE GADOBENATE DIMEGLUMINE 529 MG/ML IV SOLN   COMPARISON:  Left hip MRI 12/20/2019   FINDINGS: Urinary Tract:  No abnormality visualized.   Bowel:  Sigmoid diverticulosis.   Vascular/Lymphatic: No pathologically enlarged lymph nodes. No significant vascular abnormality seen.   Reproductive:  Unremarkable.   Other:  None   Musculoskeletal: There is no evidence of acute fracture or avascular necrosis. No focal bone lesion. There is lower lumbar spine degenerative disc disease, as seen on prior lumbar spine MRI 11/09/2020. The sacroiliac joints demonstrate mild degenerative changes without bony erosion, joint effusion, or enhancement.   There is mild to moderate chondrosis of the hips with degenerative anterior superior labral tearing.   There is narrowing of the ischiofemoral space with soft tissue edema in the region of the quadratus femoris. (Axial T2 image 33). There is tendinosis and low-grade partial tearing of the left proximal hamstrings. Mild peritrochanteric edema bilaterally, left greater than right. The gluteal tendons are intact. The adductors are intact. No focal fluid collection.   IMPRESSION: Mild bilateral hip osteoarthritis with degenerative anterior superior labral tearing.   Findings of left-sided ischiofemoral impingement.   Tendinosis and low-grade partial tearing of the left proximal hamstrings.   Mild peritrochanteric edema bilaterally. No evidence of  gluteal tendon tear.     Electronically Signed   By: Maurine Simmering M.D.   On: 06/24/2021 10:43     Objective:  VS:  HT:    WT:   BMI:      BP:   HR: bpm  TEMP: ( )  RESP:  Physical Exam   Imaging: No results found.

## 2022-01-05 NOTE — Patient Instructions (Signed)

## 2022-01-11 ENCOUNTER — Telehealth: Payer: Self-pay

## 2022-01-11 MED ORDER — TRIAMCINOLONE ACETONIDE 40 MG/ML IJ SUSP
40.0000 mg | INTRAMUSCULAR | Status: AC | PRN
  Administered 2022-01-05: 40 mg via INTRA_ARTICULAR

## 2022-01-11 MED ORDER — BUPIVACAINE HCL 0.25 % IJ SOLN
4.0000 mL | INTRAMUSCULAR | Status: AC | PRN
  Administered 2022-01-05: 4 mL via INTRA_ARTICULAR

## 2022-01-11 NOTE — Telephone Encounter (Signed)
Patient reviewed and responded to MyChart message. Last read by Suzan Garibaldi at 11:10 AM on 01/11/2022.

## 2022-01-11 NOTE — Telephone Encounter (Signed)
MyChart message sent to patient with lab reminder.  

## 2022-01-11 NOTE — Telephone Encounter (Signed)
-----   Message from Yevette Edwards, RN sent at 10/07/2021  7:52 AM EDT ----- Regarding: Labs CBC, IBC+Ferritin - orders are in epic

## 2022-01-21 ENCOUNTER — Other Ambulatory Visit: Payer: Self-pay | Admitting: Behavioral Health

## 2022-01-21 DIAGNOSIS — F900 Attention-deficit hyperactivity disorder, predominantly inattentive type: Secondary | ICD-10-CM

## 2022-01-21 DIAGNOSIS — F411 Generalized anxiety disorder: Secondary | ICD-10-CM

## 2022-01-21 DIAGNOSIS — F331 Major depressive disorder, recurrent, moderate: Secondary | ICD-10-CM

## 2022-01-24 ENCOUNTER — Other Ambulatory Visit (INDEPENDENT_AMBULATORY_CARE_PROVIDER_SITE_OTHER): Payer: Medicare Other

## 2022-01-24 DIAGNOSIS — D509 Iron deficiency anemia, unspecified: Secondary | ICD-10-CM | POA: Diagnosis not present

## 2022-01-24 LAB — CBC WITH DIFFERENTIAL/PLATELET
Basophils Absolute: 0 10*3/uL (ref 0.0–0.1)
Basophils Relative: 0.6 % (ref 0.0–3.0)
Eosinophils Absolute: 0.2 10*3/uL (ref 0.0–0.7)
Eosinophils Relative: 3.1 % (ref 0.0–5.0)
HCT: 42 % (ref 36.0–46.0)
Hemoglobin: 14.5 g/dL (ref 12.0–15.0)
Lymphocytes Relative: 22.8 % (ref 12.0–46.0)
Lymphs Abs: 1.9 10*3/uL (ref 0.7–4.0)
MCHC: 34.6 g/dL (ref 30.0–36.0)
MCV: 95.6 fl (ref 78.0–100.0)
Monocytes Absolute: 0.6 10*3/uL (ref 0.1–1.0)
Monocytes Relative: 7.1 % (ref 3.0–12.0)
Neutro Abs: 5.4 10*3/uL (ref 1.4–7.7)
Neutrophils Relative %: 66.4 % (ref 43.0–77.0)
Platelets: 192 10*3/uL (ref 150.0–400.0)
RBC: 4.39 Mil/uL (ref 3.87–5.11)
RDW: 13.7 % (ref 11.5–15.5)
WBC: 8.1 10*3/uL (ref 4.0–10.5)

## 2022-01-24 LAB — IBC + FERRITIN
Ferritin: 14.4 ng/mL (ref 10.0–291.0)
Iron: 97 ug/dL (ref 42–145)
Saturation Ratios: 24.2 % (ref 20.0–50.0)
TIBC: 400.4 ug/dL (ref 250.0–450.0)
Transferrin: 286 mg/dL (ref 212.0–360.0)

## 2022-01-26 ENCOUNTER — Encounter: Payer: Self-pay | Admitting: Physical Medicine and Rehabilitation

## 2022-01-26 ENCOUNTER — Ambulatory Visit (INDEPENDENT_AMBULATORY_CARE_PROVIDER_SITE_OTHER): Payer: Medicare Other | Admitting: Psychology

## 2022-01-26 DIAGNOSIS — F331 Major depressive disorder, recurrent, moderate: Secondary | ICD-10-CM | POA: Diagnosis not present

## 2022-01-26 NOTE — Progress Notes (Signed)
[facility] Counselor/Therapist Progress Note  Patient ID: Jordan [name], MRN: [id number],    Date: 01/26/2022  Time Spent: 3:00pm-3:55pm   55 minutes   Treatment Type: Individual Therapy  Reported Symptoms: sadness  Mental Status Exam: Appearance:  Casual     Behavior: Appropriate  Motor: Normal  Speech/Language:  Normal Rate  Affect: Appropriate  Mood: normal  Thought process: normal  Thought content:   WNL  Sensory/Perceptual disturbances:   WNL  Orientation: oriented to person, place, time/date, and situation  Attention: Good  Concentration: Good  Memory: WNL  Fund of knowledge:  Good  Insight:   Good  Judgment:  Good  Impulse Control: Good   Risk Assessment: Danger to Self:  No Self-injurious Behavior: No Danger to Others: No Duty to Warn:no Physical Aggression / Violence:No  Access to Firearms a concern: No  Gang Involvement:No   Subjective:  Pt present for face-to-face individual therapy via video Webex.  Pt consents to telehealth video session due to COVID 19 pandemic. Location of pt: home Location of therapist: home office.   Pt talked about her dog Piper.   Pt and Piper are very bonded.    Pt talked about tapering off Adderal.  She feels like the adderal was making her angry and she had been irritable with friends.   Pt feels better since she has decreased the adderal.   She is down to '10mg'$  and plans to be off of it within a month.  Pt is working with her doctor about the tapering.  Pt is prescribed Cymbalta, Busperone, Trazadone at night.   Pt takes Xanax as needed and usually takes it at night.   Pt talked about her relationships.   She is working on getting the "toxic relationships" out of her life.   Addressed how pt can set healthy boundaries.   Pt is continuing to grieve the death of her dog [place].   Helped pt process her feelings and grief. Pt is working on expanding her social circle with people who are not "toxic".   Pt  is thinking about volunteering at [employer].   Pt will have her hip surgery on March 4th.   Her friend will stay with her after her surgery and to help her rehab.   Provided supportive therapy.  Interventions: Cognitive Behavioral Therapy and Insight-Oriented  Diagnosis:F33.1  Plan of Care: Recommend ongoing therapy.  Pt participated in setting treatment goals.  She wants to improve coping skills and self esteem.   Plan to meet every couple of weeks.    Treatment Plan (Treatment Plan Target Date:  03/24/2022) Client Abilities/Strengths  Pt is bright, engaging, and motivated for therapy.   Client Treatment Preferences  Individual therapy.  Client Statement of Needs  Improve coping skills.  Symptoms  Depressed or irritable mood. Feelings of hopelessness, worthlessness, or inappropriate guilt. Low self-esteem. Unresolved grief issues.   Problems Addressed  Unipolar Depression Goals 1. Alleviate depressive symptoms and return to previous level of effective functioning. 2. Appropriately grieve the loss in order to normalize mood and to return to previously adaptive level of functioning. Objective Learn and implement behavioral strategies to overcome depression. Target Date: 2022-03-24 Frequency: Biweekly  Progress: 10 Modality: individual  Related Interventions Engage the client in "behavioral activation," increasing his/her activity level and contact with sources of reward, while identifying processes that inhibit activation.  Use behavioral techniques such as instruction, rehearsal, role-playing, role reversal, as needed, to facilitate activity in the client's daily life;  reinforce success. Assist the client in developing skills that increase the likelihood of deriving pleasure from behavioral activation (e.g., assertiveness skills, developing an exercise plan, less internal/more external focus, increased social involvement); reinforce success. Objective Identify important  people in life, past and present, and describe the quality, good and poor, of those relationships. Target Date: 2022-03-24 Frequency: Biweekly  Progress: 10 Modality: individual  Related Interventions Conduct Interpersonal Therapy beginning with the assessment of the client's "interpersonal inventory" of important past and present relationships; develop a case formulation linking depression to grief, interpersonal role disputes, role transitions, and/or interpersonal deficits). Objective Learn and implement problem-solving and decision-making skills. Target Date: 2022-03-24 Frequency: Biweekly  Progress: 10 Modality: individual  Related Interventions Conduct Problem-Solving Therapy using techniques such as psychoeducation, modeling, and role-playing to teach client problem-solving skills (i.e., defining a problem specifically, generating possible solutions, evaluating the pros and cons of each solution, selecting and implementing a plan of action, evaluating the efficacy of the plan, accepting or revising the plan); role-play application of the problem-solving skill to a real life issue. Encourage in the client the development of a positive problem orientation in which problems and solving them are viewed as a natural part of life and not something to be feared, despaired, or avoided. 3. Develop healthy interpersonal relationships that lead to the alleviation and help prevent the relapse of depression. 4. Develop healthy thinking patterns and beliefs about self, others, and the world that lead to the alleviation and help prevent the relapse of depression. 5. Recognize, accept, and cope with feelings of depression. Diagnosis F33.1  Conditions For Discharge Achievement of treatment goals and objectives     Clint Bolder, LCSW

## 2022-02-06 ENCOUNTER — Telehealth: Payer: Self-pay | Admitting: Behavioral Health

## 2022-02-06 NOTE — Telephone Encounter (Signed)
Pt has tapered off the adderall.She is now very irritable and has a bad temper.She is losing friends because she has been rude.She did not become this way until she weaned off adderall please advise

## 2022-02-06 NOTE — Telephone Encounter (Signed)
Pt informed

## 2022-02-06 NOTE — Telephone Encounter (Signed)
Patient lvm at 11:39 stating that she has reduced Adderall to really not taking it at all. She now has a sudden temper and "that's concerning." Please rtc [phone]

## 2022-02-08 ENCOUNTER — Encounter: Payer: Self-pay | Admitting: Internal Medicine

## 2022-02-09 ENCOUNTER — Ambulatory Visit: Payer: Medicare Other | Admitting: Psychology

## 2022-02-10 DIAGNOSIS — M76892 Other specified enthesopathies of left lower limb, excluding foot: Secondary | ICD-10-CM | POA: Diagnosis not present

## 2022-02-21 ENCOUNTER — Encounter (HOSPITAL_BASED_OUTPATIENT_CLINIC_OR_DEPARTMENT_OTHER): Payer: Self-pay

## 2022-02-21 ENCOUNTER — Encounter: Payer: Self-pay | Admitting: Behavioral Health

## 2022-02-21 ENCOUNTER — Ambulatory Visit: Payer: Medicare Other | Admitting: Behavioral Health

## 2022-02-21 DIAGNOSIS — F900 Attention-deficit hyperactivity disorder, predominantly inattentive type: Secondary | ICD-10-CM | POA: Diagnosis not present

## 2022-02-21 DIAGNOSIS — F331 Major depressive disorder, recurrent, moderate: Secondary | ICD-10-CM | POA: Diagnosis not present

## 2022-02-21 DIAGNOSIS — F411 Generalized anxiety disorder: Secondary | ICD-10-CM

## 2022-02-21 MED ORDER — BUSPIRONE HCL 30 MG PO TABS: 30.0000 mg | ORAL_TABLET | Freq: Two times a day (BID) | ORAL | 3 refills | Status: DC

## 2022-02-21 MED ORDER — ALPRAZOLAM 0.5 MG PO TABS: ORAL_TABLET | ORAL | 3 refills | Status: DC

## 2022-02-21 NOTE — Progress Notes (Signed)
[facility]  Patient ID: Jordan [name],  MRN: [id number]  PCP: Isaac Bliss, Rayford Halsted, MD  Date of Evaluation: 02/21/2022 Time spent:30 minutes  Chief Complaint:  Chief Complaint   Depression; Anxiety; Follow-up; Medication Problem; Patient Education; Medication Refill     HISTORY/CURRENT STATUS: HPI  62 year old patient presents to this office face to face visit for  follow up and medication management. She is now now longer taking any Adderall. Been off for about 1.5 months. She reports feeling irritable but does not want to go back on it due to risk. Still has a lot of pain and scheduled for surgery in March.  Says her depression today is 3/10 and anxiety 2/10. She is sleeping 7-8 hours per night. She denies any current or prior mania.No psychosis, no auditory or visual hallucinations. Strong family hx of dementia. No SI or HI.   Prior psychiatric medication trials:   Abilify Latuda Risperdone Amitriptyline Trintellix Lexapro Prozac Zoloft Paxil Wellbutrin Remeron Effexor Cymbalta Rexulti       Individual Medical History/ Review of Systems: Changes? :No   Allergies: Prednisone, Pregabalin, and Remeron [mirtazapine]  Current Medications:  Current Outpatient Medications:    busPIRone (BUSPAR) 30 MG tablet, Take 1 tablet (30 mg total) by mouth 2 (two) times daily., Disp: 60 tablet, Rfl: 3   ALPRAZolam (XANAX) 0.5 MG tablet, TAKE ONE TABLET BY MOUTH TWICE A DAY AS NEEDED FOR ANXIETY, Disp: 60 tablet, Rfl: 3   amphetamine-dextroamphetamine (ADDERALL) 20 MG tablet, Take 1 tablet (20 mg total) by mouth daily., Disp: 30 tablet, Rfl: 0   amphetamine-dextroamphetamine (ADDERALL) 20 MG tablet, Take 1 tablet (20 mg total) by mouth daily., Disp: 30 tablet, Rfl: 0   dicyclomine (BENTYL) 10 MG capsule, Take 1 capsule (10 mg total) by mouth every 6 (six) hours as needed (abdominal pain)., Disp: 30 capsule, Rfl: 1   DULoxetine (CYMBALTA) 60 MG  capsule, Take 1 capsule (60 mg total) by mouth 2 (two) times daily., Disp: 270 capsule, Rfl: 2   esomeprazole (NEXIUM) 40 MG capsule, TAKE 1 CAPSULE BY MOUTH TWICE  DAILY BEFORE MEALS, Disp: 180 capsule, Rfl: 3   fluticasone (FLONASE) 50 MCG/ACT nasal spray, SPRAY ONE SPRAY IN EACH NOSTRIL ONCE DAILY AS NEEDED FOR ALLERGIES OR RHINITIS, Disp: 16 mL, Rfl: 2   ondansetron (ZOFRAN-ODT) 8 MG disintegrating tablet, Take 1 tablet (8 mg total) by mouth every 8 (eight) hours as needed for nausea or vomiting., Disp: 20 tablet, Rfl: 0   sucralfate (CARAFATE) 1 g tablet, Take 1 tablet (1 g total) by mouth every 6 (six) hours as needed. Slowly dissolve 1 tablet in 1 Tablespoon of distilled water prior to taking, Disp: 60 tablet, Rfl: 1   traZODone (DESYREL) 50 MG tablet, TAKE 2 TABLETS BY MOUTH AT  BEDTIME, Disp: 90 tablet, Rfl: 0 Medication Side Effects: none  Family Medical/ Social History: Changes? No  MENTAL HEALTH EXAM:  There were no vitals taken for this visit.There is no height or weight on file to calculate BMI.  General Appearance: Casual, Neat, and Well Groomed  Eye Contact:  Good  Speech:  Clear and Coherent  Volume:  Normal  Mood:  Anxious, Depressed, and Dysphoric  Affect:  Congruent  Thought Process:  Coherent  Orientation:  Full (Time, Place, and Person)  Thought Content: Logical   Suicidal Thoughts:  No  Homicidal Thoughts:  No  Memory:  WNL  Judgement:  Good  Insight:  Good  Psychomotor Activity:  Normal  Concentration:  Concentration: Good  Recall:  Good  Fund of Knowledge: Good  Language: Good  Assets:  Desire for Improvement  ADL's:  Intact  Cognition: WNL  Prognosis:  Good    DIAGNOSES:    ICD-10-CM   1. Major depressive disorder, recurrent episode, moderate (HCC)  F33.1 busPIRone (BUSPAR) 30 MG tablet    ALPRAZolam (XANAX) 0.5 MG tablet    2. Generalized anxiety disorder  F41.1 busPIRone (BUSPAR) 30 MG tablet    ALPRAZolam (XANAX) 0.5 MG tablet    3. Attention  deficit hyperactivity disorder (ADHD), predominantly inattentive type  F90.0 ALPRAZolam (XANAX) 0.5 MG tablet      Receiving Psychotherapy: No    RECOMMENDATIONS:   Greater than 50% of 30 face to face time with patient was spent on counseling and coordination of care. We discussed her current stability. She is doing well overall. Pain is a big factor for her not being where she would like to be mentally. She is now totally off her Adderall 1.5 months.  We agreed to: Will continue Cymbalta 60 mg twice daily To continue Trazodone 50 mg at bedtime as reported by pt To reduce Adderall to 20 mg IR I the am. Increase  Buspar to 30 mg twice daily, at least 7-8 hours between doses. Continue Xanax 0.5 mg twice daily for severe anxiety only.  To report worsening symptoms or side effects promptly Will follow up in 8 weeks to reassess Provided emergency contact information Discussed potential metabolic side effects associated with atypical antipsychotics, as well as potential risk for movement side effects. Advised pt to contact office if movement side effects occur.   Reviewed PDMP       Elwanda Brooklyn, NP

## 2022-03-07 ENCOUNTER — Ambulatory Visit (HOSPITAL_BASED_OUTPATIENT_CLINIC_OR_DEPARTMENT_OTHER): Payer: Medicare Other | Admitting: Cardiology

## 2022-03-07 ENCOUNTER — Encounter (HOSPITAL_BASED_OUTPATIENT_CLINIC_OR_DEPARTMENT_OTHER): Payer: Self-pay | Admitting: Cardiology

## 2022-03-07 VITALS — BP 134/72 | HR 72 | Ht 63.0 in | Wt 138.1 lb

## 2022-03-07 DIAGNOSIS — Z7189 Other specified counseling: Secondary | ICD-10-CM

## 2022-03-07 DIAGNOSIS — Z01810 Encounter for preprocedural cardiovascular examination: Secondary | ICD-10-CM | POA: Diagnosis not present

## 2022-03-07 DIAGNOSIS — E782 Mixed hyperlipidemia: Secondary | ICD-10-CM

## 2022-03-07 DIAGNOSIS — Z8249 Family history of ischemic heart disease and other diseases of the circulatory system: Secondary | ICD-10-CM

## 2022-03-07 NOTE — Progress Notes (Signed)
Cardiology Office Note:    Date:  03/07/2022   ID:  Jordan [name], DOB 1960/07/23, MRN [id number]  PCP:  Isaac Bliss, Rayford Halsted, MD  Cardiologist:  Buford Dresser, MD  Referring MD: Isaac Bliss, [place]*   CC: Follow-up for preoperative clearance  History of Present Illness:    Jordan [name] is a 62 y.o. female with a hx of anxiety, depression, panic disorder, fibromyalgia, seizures who is seen for follow-up and preoperative clearance. She was initially seen 11/20/2019 as a new consult at the request of Isaac Bliss, [place]* for the evaluation and management of syncope.  Cardiovascular risk factors: Prior clinical ASCVD: may have had mini-strokes, has some small areas in her brain, unclear cause (not multiple sclerosis) in 2013 Comorbid conditions: Denies hypertension, hyperlipidemia, diabetes, chronic kidney disease Metabolic syndrome/Obesity: BMI 26 Chronic inflammatory conditions: none Tobacco use history: never smoker Family history: Father had CHF, DM, obesity, CAD s/p multiple bypass surgeries, died of CHF at age 54. Mother had CAD s/p stent, mild MI. Died of blood clot, had cervical cancer. Mat Gma had heart issues in her 90s. Sister is recovered alcoholic, no other health issues. Prior cardiac testing and/or incidental findings on other testing (ie coronary calcium): stress echo 07/29/19 ([facility]) Exercise level: runs sprints, plays basketball Current diet: limited diet, but healthy. Lean proteins, whole grains, lots of fruits/vegetables. 1-2 glasses of wine/night. Cautious about sugars. No longer drinks sodas. One cup of coffee/day.  Today, she presents for preoperative cardiac clearance. She is scheduled for surgery at [facility] on 04/24/2022 regarding an ischiofemoral nerve impingement. Will be performed by Dr. Deatra Robinson. Full anesthesia.  She notes that therapeutic injections no longer work for her left hip pain. She is not able to  formally exercise much aside from walking frequently. Typically she stays very active, and assists her neighbors with projects such as painting or building a fence. She endorses being able to climb a flight of stairs.   She complains of fatigue which she states is attributable to her iron, as well as occasional brain fog.  Her diet consists mainly of vegetables and fruit. She states her triglycerides and cholesterol have increased due to her pain limiting her exercise.  Of note she has self-discontinued her adderall. Tapered off.   She denies any palpitations, chest pain, shortness of breath, or peripheral edema. No lightheadedness, headaches, syncope, orthopnea, or PND.   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    Lump or mass in breast    Meningitis, unspecified(322.9)    Oral thrush    Other specified glaucoma    Perforation of tympanic membrane, unspecified    Radial styloid tenosynovitis    Seizures ([place])     Past Surgical History:  Procedure Laterality Date   ADENOIDECTOMY  ANKLE SURGERY  1992   right ankle - scar tissue   DILATION AND CURETTAGE OF UTERUS  2006   History of Menometrorrhagia   INNER EAR SURGERY  1966 to 1986   6 surgeries on right ear   LAPAROSCOPY  1993   normal   REDUCTION MAMMAPLASTY Bilateral 2018   uterine ablation  2011   WRIST SURGERY  2001   for de-quervaine - bilateral wrists - tendonitis    Current  Medications: Current Outpatient Medications on File Prior to Visit  Medication Sig   ALPRAZolam (XANAX) 1 MG tablet Take 1 mg by mouth as needed for anxiety or sleep.   busPIRone (BUSPAR) 30 MG tablet Take 1 tablet (30 mg total) by mouth 2 (two) times daily.   dicyclomine (BENTYL) 10 MG capsule Take 1 capsule (10 mg total) by mouth every 6 (six) hours as needed (abdominal pain).   DULoxetine (CYMBALTA) 60 MG capsule Take 1 capsule (60 mg total) by mouth 2 (two) times daily.   esomeprazole (NEXIUM) 40 MG capsule TAKE 1 CAPSULE BY MOUTH TWICE  DAILY BEFORE MEALS   fluticasone (FLONASE) 50 MCG/ACT nasal spray SPRAY ONE SPRAY IN EACH NOSTRIL ONCE DAILY AS NEEDED FOR ALLERGIES OR RHINITIS   ibuprofen (ADVIL) 200 MG tablet Take 800 mg by mouth 2 (two) times daily.   ondansetron (ZOFRAN-ODT) 8 MG disintegrating tablet Take 1 tablet (8 mg total) by mouth every 8 (eight) hours as needed for nausea or vomiting.   sucralfate (CARAFATE) 1 g tablet Take 1 tablet (1 g total) by mouth every 6 (six) hours as needed. Slowly dissolve 1 tablet in 1 Tablespoon of distilled water prior to taking   tiZANidine (ZANAFLEX) 2 MG tablet Take 2 mg by mouth 3 (three) times daily.   traZODone (DESYREL) 50 MG tablet TAKE 2 TABLETS BY MOUTH AT  BEDTIME   No current facility-administered medications on file prior to visit.     Allergies:   Prednisone, Pregabalin, and Remeron [mirtazapine]   Social History   Tobacco Use   Smoking status: Never   Smokeless tobacco: Never  Vaping Use   Vaping Use: Never used  Substance Use Topics   Alcohol use: Yes    Alcohol/week: 7.0 standard drinks of alcohol    Types: 7 Glasses of wine per week    Comment: 2 glasses of wine per night   Drug use: No    Family History: family history includes Alcohol abuse in her maternal aunt, maternal grandfather, maternal uncle, mother, and sister; Anxiety disorder in her maternal aunt; Breast cancer in her maternal grandmother; Colon polyps  (age of onset: 82) in her mother; Coronary artery disease in her mother; Dementia in her mother; Depression in her maternal aunt; Diabetes in her father; Heart disease in her mother; Heart failure in her father; Hyperlipidemia in her father; Hypertension in her father and mother; Ovarian cancer in her mother. There is no history of Colon cancer, Esophageal cancer, Stomach cancer, or Rectal cancer.  ROS:   Please see the history of present illness. (+) Left hip pain. (+) Fatigue (+) Brain fog All other systems are reviewed and negative.    EKGs/Labs/Other Studies Reviewed:    The following studies were reviewed today:  Stress echo 6.8.21 ([facility]) Post-stress: The left ventricle systolic function is hyperdynamic  post-stress with an EF of 70-75 %. The post-stress echo showed normal wall  motion which was hyperdynamic compared to baseline.    Stress ECG: ECG Conclusion:  No ischemic ST  segment changes occurred  with stress. Patient developed atypical chest pain at peak stress at right  upper chest close to shoulder.  Chest pain lasted 10 seconds and got  resolved by itself went treadmill slowed down.  Doubt angina. An  exagerated hypertensive BP response with max BP 210/80. Good functional  capacity with estimated workload 9.7 METS which is above average  functional capacity for age and gender.    Post-stress Impression: The study is normal. This study shows a low  prognostic risk. The ECG and Echo portions of the stress study are  concordant with no evidence of inducible myocardial ischemia.    Left Ventricle: Systolic function is normal. EF: 65-67%. Doppler  parameters indicate normal diastolic function.   9.7 Mets achieved  No significant valvular disease noted.  Monitor 5.18.21 ([facility]) Patient was wearing extended Holter monitor for 9 days 15 hours and 9 minutes from April 27 until Jun 27, 2019.   Indication: Palpitations.   Patient remained in sinus rhythm with minimum heart  rate 64 bpm at 2:42 AM on day 6, maximum heart rate in sinus rhythm 127 bpm at 2:07 AM on day 5.   One episode of bradycardia noted with bradycardia burden less than 0.01%.    774 episodes of tachycardia noted with tachycardia burden 10.54%.   3092 isolated PVCs noted with PVC burden 0.26%.  Only 851 supraventricular ectopic beats noted.   5 beat narrow complex tachycardia likely represented pentaplet of PACs at 2:07 AM on May 1 with heart rate 136 bpm.  SVT is less likely.  3 beat run of irregular narrow complex tachycardia with heart rate 132 bpm that 12:40 AM on May 4 likely represented triplet of PACs.  SVT is less likely.   There are no patient triggered events.   The longest R to R interval 0.95 seconds at 2:42 AM on May 2.   No A. fib, no VT, no long pauses 3 seconds or more noted.  EKG:  EKG is personally reviewed.   03/07/2022:  NSR at 72 bpm 11/20/2019:  NSR at 82 bpm, borderline low voltage  Recent Labs: 07/20/2021: ALT 21; BUN 25; Creatinine, Ser 1.01; Potassium 4.1; Sodium 138 01/24/2022: Hemoglobin 14.5; Platelets 192.0   Recent Lipid Panel    Component Value Date/Time   CHOL 193 03/21/2021 1106   TRIG 202.0 (H) 03/21/2021 1106   HDL 48.20 03/21/2021 1106   CHOLHDL 4 03/21/2021 1106   VLDL 40.4 (H) 03/21/2021 1106   LDLCALC 99 04/21/2020 0845   LDLDIRECT 119.0 03/21/2021 1106    Physical Exam:    VS:  BP 134/72 (BP Location: Right Arm, Patient Position: Sitting, Cuff Size: Normal)   Pulse 72   Ht 5' 3"$  (1.6 m)   Wt 138 lb 1.6 oz (62.6 kg)   BMI 24.46 kg/m     Wt Readings from Last 3 Encounters:  03/07/22 138 lb 1.6 oz (62.6 kg)  09/26/21 139 lb (63 kg)  08/18/21 142 lb (64.4 kg)    GEN: Well nourished, well developed in no acute distress HEENT: Normal, moist mucous membranes NECK: No JVD CARDIAC: regular rhythm, normal S1 and S2, no rubs or gallops. No murmurs. VASCULAR: Radial and DP pulses 2+ bilaterally. No carotid bruits RESPIRATORY:  Clear to  auscultation without rales, wheezing or rhonchi  ABDOMEN: Soft, non-tender, non-distended MUSCULOSKELETAL:  Ambulates independently SKIN: Warm and dry, no edema NEUROLOGIC:  Alert and oriented x 3. No focal neuro deficits noted. PSYCHIATRIC:  Normal affect  ASSESSMENT:    1. Preop cardiovascular exam   2. Cardiac risk counseling   3. Mixed hyperlipidemia   4. Family history of heart disease     PLAN:    Preoperative cardiovascular exam According to the Revised Cardiac Risk Index (RCRI), her Perioperative Risk of Major Cardiac Event is (%): 0.9  The patient is not currently having active cardiac symptoms, and they can achieve >4 METs of activity.  According to ACC/AHA Guidelines, no further testing is needed.  Proceed with surgery at acceptable risk.  Our service is available as needed in the peri-operative period.     Family history of heart disease Mixed hyperlipidemia -we reviewed her risk at length -her BMI is 15, but she is very active and eats a healthy diet. Weight gain is a struggle on her brexpiprazole, but this is working well for her -discussed calcium score as additional risk factor evaluation. She will consider and contact me if interested. -discussed how plaque forms, how we manage risk  Cardiac risk counseling and prevention recommendations: -recommend heart healthy/Mediterranean diet, with whole grains, fruits, vegetable, fish, lean meats, nuts, and olive oil. Limit salt. -recommend moderate walking, 3-5 times/week for 30-50 minutes each session. Aim for at least 150 minutes.week. Goal should be pace of 3 miles/hours, or walking 1.5 miles in 30 minutes -recommend avoidance of tobacco products. Avoid excess alcohol.  Plan for follow up: I would be happy to see her back as needed.  Buford Dresser, MD, PhD [place]  [facility]    Medication Adjustments/Labs and Tests Ordered: Current medicines are reviewed at length with the patient today.   Concerns regarding medicines are outlined above.   Orders Placed This Encounter  Procedures   EKG 12-Lead   No orders of the defined types were placed in this encounter.  Patient Instructions  Medication Instructions:  Your physician recommends that you continue on your current medications as directed. Please refer to the Current Medication list given to you today.   Labwork: NONE  Testing/Procedures: NONE  Follow-Up: AS NEEDED     I,Mathew Stumpf,acting as a Education administrator for [employer], MD.,have documented all relevant documentation on the behalf of Buford Dresser, MD,as directed by  Buford Dresser, MD while in the presence of Buford Dresser, MD.  I, Buford Dresser, MD, have reviewed all documentation for this visit. The documentation on 04/09/22 for the exam, diagnosis, procedures, and orders are all accurate and complete.   Signed, Buford Dresser, MD PhD 03/07/2022     [place]

## 2022-03-07 NOTE — Patient Instructions (Signed)
Medication Instructions:  ?Your physician recommends that you continue on your current medications as directed. Please refer to the Current Medication list given to you today.  ? ?Labwork: ?NONE ? ?Testing/Procedures: ?NONE ? ?Follow-Up: ?AS NEEDED  ? ?  ?

## 2022-03-13 ENCOUNTER — Encounter (HOSPITAL_BASED_OUTPATIENT_CLINIC_OR_DEPARTMENT_OTHER): Payer: Self-pay

## 2022-03-13 ENCOUNTER — Encounter (HOSPITAL_BASED_OUTPATIENT_CLINIC_OR_DEPARTMENT_OTHER): Payer: Self-pay | Admitting: *Deleted

## 2022-03-20 ENCOUNTER — Ambulatory Visit (INDEPENDENT_AMBULATORY_CARE_PROVIDER_SITE_OTHER): Payer: Medicare Other | Admitting: Psychology

## 2022-03-20 DIAGNOSIS — F331 Major depressive disorder, recurrent, moderate: Secondary | ICD-10-CM | POA: Diagnosis not present

## 2022-03-20 NOTE — Progress Notes (Signed)
[place] Counselor/Therapist Progress Note  Patient ID: Jordan [name], MRN: [id number],    Date: 03/20/2022  Time Spent: 4:00pm-4:50pm   50 minutes   Treatment Type: Individual Therapy  Reported Symptoms: sadness  Mental Status Exam: Appearance:  Casual     Behavior: Appropriate  Motor: Normal  Speech/Language:  Normal Rate  Affect: Appropriate  Mood: normal  Thought process: normal  Thought content:   WNL  Sensory/Perceptual disturbances:   WNL  Orientation: oriented to person, place, time/date, and situation  Attention: Good  Concentration: Good  Memory: WNL  Fund of knowledge:  Good  Insight:   Good  Judgment:  Good  Impulse Control: Good   Risk Assessment: Danger to Self:  No Self-injurious Behavior: No Danger to Others: No Duty to Warn:no Physical Aggression / Violence:No  Access to Firearms a concern: No  Gang Involvement:No   Subjective:  Pt present for face-to-face individual therapy via video Webex.  Pt consents to telehealth video session due to COVID 19 pandemic. Location of pt: home Location of therapist: home office.   Pt talked about being in a lot of pain.  She will have hip surgery March 4th.  She has a nerve impingement that is very painful.  The surgery will address the nerve impingement.   They will also do a labral tear repair.    Worked on coping strategies.   Pt talked about missing Jordan [name].  She continues to grieve Jordan [name]'s death.  Helped pt process her feelings and grief. Pt states Jordan [name] is a good dog but she is getting tired of what a "velcro" dog Jordan [name] is.  Pt's friends and neighbors are going to do a meal train and take care of Jordan [name] when pt is rehabbing from her surgery.   Pt is doing a lot to prepare for her surgery and to be sure that she has the care she needs after the surgery.   Pt talked about her relationship with her friend Jordan [name].   He is a billionnaire and is very supportive of pt.  Pt does some work  with Jordan [name] and they have a good relationship.   Provided supportive therapy.  Interventions: Cognitive Behavioral Therapy and Insight-Oriented  Diagnosis:F33.1  Plan of Care: Recommend ongoing therapy.  Pt participated in setting treatment goals.  She wants to improve coping skills and self esteem.   Plan to meet every couple of weeks.    Treatment Plan (Treatment Plan Target Date:  03/24/2022) Client Abilities/Strengths  Pt is bright, engaging, and motivated for therapy.   Client Treatment Preferences  Individual therapy.  Client Statement of Needs  Improve coping skills.  Symptoms  Depressed or irritable mood. Feelings of hopelessness, worthlessness, or inappropriate guilt. Low self-esteem. Unresolved grief issues.   Problems Addressed  Unipolar Depression Goals 1. Alleviate depressive symptoms and return to previous level of effective functioning. 2. Appropriately grieve the loss in order to normalize mood and to return to previously adaptive level of functioning. Objective Learn and implement behavioral strategies to overcome depression. Target Date: 2022-03-24 Frequency: Biweekly  Progress: 10 Modality: individual  Related Interventions Engage the client in "behavioral activation," increasing his/her activity level and contact with sources of reward, while identifying processes that inhibit activation.  Use behavioral techniques such as instruction, rehearsal, role-playing, role reversal, as needed, to facilitate activity in the client's daily life; reinforce success. Assist the client in developing skills that increase the likelihood of deriving pleasure from behavioral activation (e.g., assertiveness skills, developing an exercise  plan, less internal/more external focus, increased social involvement); reinforce success. Objective Identify important people in life, past and present, and describe the quality, good and poor, of those relationships. Target Date: 2022-03-24  Frequency: Biweekly  Progress: 10 Modality: individual  Related Interventions Conduct Interpersonal Therapy beginning with the assessment of the client's "interpersonal inventory" of important past and present relationships; develop a case formulation linking depression to grief, interpersonal role disputes, role transitions, and/or interpersonal deficits). Objective Learn and implement problem-solving and decision-making skills. Target Date: 2022-03-24 Frequency: Biweekly  Progress: 10 Modality: individual  Related Interventions Conduct Problem-Solving Therapy using techniques such as psychoeducation, modeling, and role-playing to teach client problem-solving skills (i.e., defining a problem specifically, generating possible solutions, evaluating the pros and cons of each solution, selecting and implementing a plan of action, evaluating the efficacy of the plan, accepting or revising the plan); role-play application of the problem-solving skill to a real life issue. Encourage in the client the development of a positive problem orientation in which problems and solving them are viewed as a natural part of life and not something to be feared, despaired, or avoided. 3. Develop healthy interpersonal relationships that lead to the alleviation and help prevent the relapse of depression. 4. Develop healthy thinking patterns and beliefs about self, others, and the world that lead to the alleviation and help prevent the relapse of depression. 5. Recognize, accept, and cope with feelings of depression. Diagnosis F33.1  Conditions For Discharge Achievement of treatment goals and objectives     Clint Bolder, LCSW

## 2022-03-29 ENCOUNTER — Encounter: Payer: Self-pay | Admitting: Internal Medicine

## 2022-03-29 ENCOUNTER — Encounter: Payer: Self-pay | Admitting: Gastroenterology

## 2022-03-29 DIAGNOSIS — D509 Iron deficiency anemia, unspecified: Secondary | ICD-10-CM

## 2022-03-31 ENCOUNTER — Telehealth: Payer: Self-pay | Admitting: Behavioral Health

## 2022-03-31 DIAGNOSIS — M76892 Other specified enthesopathies of left lower limb, excluding foot: Secondary | ICD-10-CM | POA: Diagnosis not present

## 2022-03-31 NOTE — Telephone Encounter (Signed)
Jordan [name] called and LM at 2:31 saying she wanted some labs done to check cortizol? levels due to Buspar.  She has appt Tues and would like you to be prepared to talk about doing these labs.

## 2022-03-31 NOTE — Telephone Encounter (Signed)
FYI. See message.

## 2022-04-04 ENCOUNTER — Ambulatory Visit (INDEPENDENT_AMBULATORY_CARE_PROVIDER_SITE_OTHER): Payer: Medicare Other | Admitting: Behavioral Health

## 2022-04-04 DIAGNOSIS — H6501 Acute serous otitis media, right ear: Secondary | ICD-10-CM | POA: Diagnosis not present

## 2022-04-05 ENCOUNTER — Encounter (HOSPITAL_BASED_OUTPATIENT_CLINIC_OR_DEPARTMENT_OTHER): Payer: Self-pay | Admitting: *Deleted

## 2022-04-06 ENCOUNTER — Encounter (HOSPITAL_BASED_OUTPATIENT_CLINIC_OR_DEPARTMENT_OTHER): Payer: Self-pay | Admitting: Emergency Medicine

## 2022-04-06 ENCOUNTER — Other Ambulatory Visit: Payer: Self-pay

## 2022-04-06 ENCOUNTER — Emergency Department (HOSPITAL_BASED_OUTPATIENT_CLINIC_OR_DEPARTMENT_OTHER)
Admission: EM | Admit: 2022-04-06 | Discharge: 2022-04-06 | Discharge status: Against Medical Advice | Payer: Medicare Other | Attending: Emergency Medicine | Admitting: Emergency Medicine

## 2022-04-06 DIAGNOSIS — Z5321 Procedure and treatment not carried out due to patient leaving prior to being seen by health care provider: Secondary | ICD-10-CM | POA: Diagnosis not present

## 2022-04-06 DIAGNOSIS — H9201 Otalgia, right ear: Secondary | ICD-10-CM | POA: Diagnosis not present

## 2022-04-06 NOTE — ED Notes (Signed)
Patient left Examination Area stating to Secretary she is leaving.

## 2022-04-06 NOTE — ED Triage Notes (Signed)
Pt arrives to ED with c/o right sided otalgia. Was seen at [facility] x2 days ago and started on Clindamycin. Was referred to ENT due to multiple surgeries to the ear.

## 2022-04-09 ENCOUNTER — Encounter (HOSPITAL_BASED_OUTPATIENT_CLINIC_OR_DEPARTMENT_OTHER): Payer: Self-pay | Admitting: Cardiology

## 2022-04-10 ENCOUNTER — Other Ambulatory Visit (INDEPENDENT_AMBULATORY_CARE_PROVIDER_SITE_OTHER): Payer: Medicare Other

## 2022-04-10 DIAGNOSIS — D509 Iron deficiency anemia, unspecified: Secondary | ICD-10-CM

## 2022-04-10 LAB — CBC WITH DIFFERENTIAL/PLATELET
Basophils Absolute: 0.1 10*3/uL (ref 0.0–0.1)
Basophils Relative: 0.7 % (ref 0.0–3.0)
Eosinophils Absolute: 0.2 10*3/uL (ref 0.0–0.7)
Eosinophils Relative: 2.1 % (ref 0.0–5.0)
HCT: 42.7 % (ref 36.0–46.0)
Hemoglobin: 14.6 g/dL (ref 12.0–15.0)
Lymphocytes Relative: 22 % (ref 12.0–46.0)
Lymphs Abs: 1.8 10*3/uL (ref 0.7–4.0)
MCHC: 34.1 g/dL (ref 30.0–36.0)
MCV: 95.9 fl (ref 78.0–100.0)
Monocytes Absolute: 0.7 10*3/uL (ref 0.1–1.0)
Monocytes Relative: 8.1 % (ref 3.0–12.0)
Neutro Abs: 5.4 10*3/uL (ref 1.4–7.7)
Neutrophils Relative %: 67.1 % (ref 43.0–77.0)
Platelets: 228 10*3/uL (ref 150.0–400.0)
RBC: 4.46 Mil/uL (ref 3.87–5.11)
RDW: 13.1 % (ref 11.5–15.5)
WBC: 8.1 10*3/uL (ref 4.0–10.5)

## 2022-04-10 LAB — IBC + FERRITIN
Ferritin: 21.8 ng/mL (ref 10.0–291.0)
Iron: 153 ug/dL — ABNORMAL HIGH (ref 42–145)
Saturation Ratios: 34.7 % (ref 20.0–50.0)
TIBC: 441 ug/dL (ref 250.0–450.0)
Transferrin: 315 mg/dL (ref 212.0–360.0)

## 2022-04-11 NOTE — ED Provider Notes (Signed)
Patient eloped from the emergency department prior to provider evaluation.   Fransico Meadow, MD 04/11/22 [phone]

## 2022-04-13 ENCOUNTER — Encounter: Payer: Self-pay | Admitting: Internal Medicine

## 2022-04-13 ENCOUNTER — Ambulatory Visit (INDEPENDENT_AMBULATORY_CARE_PROVIDER_SITE_OTHER): Payer: Medicare Other | Admitting: Internal Medicine

## 2022-04-13 VITALS — BP 110/70 | HR 80 | Temp 98.1°F | Ht 63.0 in | Wt 133.8 lb

## 2022-04-13 DIAGNOSIS — E785 Hyperlipidemia, unspecified: Secondary | ICD-10-CM | POA: Diagnosis not present

## 2022-04-13 DIAGNOSIS — Z23 Encounter for immunization: Secondary | ICD-10-CM | POA: Diagnosis not present

## 2022-04-13 DIAGNOSIS — D509 Iron deficiency anemia, unspecified: Secondary | ICD-10-CM | POA: Diagnosis not present

## 2022-04-13 DIAGNOSIS — F332 Major depressive disorder, recurrent severe without psychotic features: Secondary | ICD-10-CM

## 2022-04-13 DIAGNOSIS — R7302 Impaired glucose tolerance (oral): Secondary | ICD-10-CM

## 2022-04-13 DIAGNOSIS — Z13820 Encounter for screening for osteoporosis: Secondary | ICD-10-CM

## 2022-04-13 DIAGNOSIS — Z0000 Encounter for general adult medical examination without abnormal findings: Secondary | ICD-10-CM | POA: Diagnosis not present

## 2022-04-13 DIAGNOSIS — R739 Hyperglycemia, unspecified: Secondary | ICD-10-CM | POA: Diagnosis not present

## 2022-04-13 LAB — IBC + FERRITIN
Ferritin: 22.3 ng/mL (ref 10.0–291.0)
Iron: 99 ug/dL (ref 42–145)
Saturation Ratios: 22.2 % (ref 20.0–50.0)
TIBC: 446.6 ug/dL (ref 250.0–450.0)
Transferrin: 319 mg/dL (ref 212.0–360.0)

## 2022-04-13 LAB — CBC WITH DIFFERENTIAL/PLATELET
Basophils Absolute: 0.1 10*3/uL (ref 0.0–0.1)
Basophils Relative: 1 % (ref 0.0–3.0)
Eosinophils Absolute: 0.2 10*3/uL (ref 0.0–0.7)
Eosinophils Relative: 3.2 % (ref 0.0–5.0)
HCT: 40.8 % (ref 36.0–46.0)
Hemoglobin: 14.1 g/dL (ref 12.0–15.0)
Lymphocytes Relative: 28.3 % (ref 12.0–46.0)
Lymphs Abs: 1.6 10*3/uL (ref 0.7–4.0)
MCHC: 34.6 g/dL (ref 30.0–36.0)
MCV: 94.4 fl (ref 78.0–100.0)
Monocytes Absolute: 0.5 10*3/uL (ref 0.1–1.0)
Monocytes Relative: 8.8 % (ref 3.0–12.0)
Neutro Abs: 3.4 10*3/uL (ref 1.4–7.7)
Neutrophils Relative %: 58.7 % (ref 43.0–77.0)
Platelets: 204 10*3/uL (ref 150.0–400.0)
RBC: 4.32 Mil/uL (ref 3.87–5.11)
RDW: 12.6 % (ref 11.5–15.5)
WBC: 5.8 10*3/uL (ref 4.0–10.5)

## 2022-04-13 LAB — COMPREHENSIVE METABOLIC PANEL
ALT: 14 U/L (ref 0–35)
AST: 19 U/L (ref 0–37)
Albumin: 4.6 g/dL (ref 3.5–5.2)
Alkaline Phosphatase: 70 U/L (ref 39–117)
BUN: 19 mg/dL (ref 6–23)
CO2: 25 mEq/L (ref 19–32)
Calcium: 9.4 mg/dL (ref 8.4–10.5)
Chloride: 107 mEq/L (ref 96–112)
Creatinine, Ser: 1 mg/dL (ref 0.40–1.20)
GFR: 60.72 mL/min (ref >60.00)
Glucose, Bld: 96 mg/dL (ref 70–99)
Potassium: 3.8 mEq/L (ref 3.5–5.1)
Sodium: 142 mEq/L (ref 135–145)
Total Bilirubin: 0.6 mg/dL (ref 0.2–1.2)
Total Protein: 6.6 g/dL (ref 6.0–8.3)

## 2022-04-13 LAB — VITAMIN B12: Vitamin B-12: 611 pg/mL (ref 211–911)

## 2022-04-13 LAB — LIPID PANEL
Cholesterol: 200 mg/dL (ref 0–200)
HDL: 55.2 mg/dL (ref >39.00)
LDL Cholesterol: 106 mg/dL — ABNORMAL HIGH (ref 0–99)
NonHDL: 144.36
Total CHOL/HDL Ratio: 4
Triglycerides: 191 mg/dL — ABNORMAL HIGH (ref 0.0–149.0)
VLDL: 38.2 mg/dL (ref 0.0–40.0)

## 2022-04-13 LAB — HEMOGLOBIN A1C: Hgb A1c MFr Bld: 5.3 % (ref 4.6–6.5)

## 2022-04-13 LAB — TSH: TSH: 1.28 u[IU]/mL (ref 0.35–5.50)

## 2022-04-13 LAB — VITAMIN D 25 HYDROXY (VIT D DEFICIENCY, FRACTURES): VITD: 22.07 ng/mL — ABNORMAL LOW (ref 30.00–100.00)

## 2022-04-13 NOTE — Progress Notes (Addendum)
Established Patient Office Visit     CC/Reason for Visit: Annual preventive exam and subsequent Medicare wellness visit  HPI: Jordan [name] is a 62 y.o. female who is coming in today for the above mentioned reasons. Past Medical History is significant for: Depression/anxiety, ADHD followed by behavioral health.  She has weaned herself off Adderall.  Chronic back pain, she is scheduled for an ischiofemoral nerve impingement surgery at [facility] in 2 weeks.  Iron deficiency anemia.  [place] her left hip, low back pain very well.  He has routine eye and dental care, she had to go to urgent care due to a right otitis media with clindamycin, +2 weeks ago.  She is due for pneumonia, RSV, shingles vaccines.  Cancer screening is up-to-date.   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    Lump or mass in breast    Meningitis, unspecified(322.9)    Oral thrush    Other specified glaucoma    Perforation of tympanic membrane, unspecified    Radial styloid tenosynovitis    Seizures ([place])     Past Surgical History:  Procedure Laterality Date   ADENOIDECTOMY     ANKLE SURGERY  1992   right ankle - scar tissue   DILATION AND CURETTAGE OF UTERUS  2006   History of Menometrorrhagia   INNER EAR SURGERY  1966 to 1986   6 surgeries on right ear   LAPAROSCOPY  1993   normal   REDUCTION MAMMAPLASTY Bilateral 2018    uterine ablation  2011   WRIST SURGERY  2001   for de-quervaine - bilateral wrists - tendonitis    Social History:  reports that she has never smoked. She has never used smokeless tobacco. She reports current alcohol use of about 7.0 standard drinks of alcohol per week. She reports that she does not use drugs.  Allergies: Allergies  Allergen Reactions   Prednisone Rash    Face redness and swollen per patient   Pregabalin Other (See Comments)    Delusions   Remeron [Mirtazapine] Other (See Comments)    Severe hallucinations    Family History:  Family History  Problem Relation Age of Onset   Dementia Mother    Alcohol abuse Mother    Ovarian cancer Mother    Heart disease Mother    Hypertension Mother    Coronary artery disease Mother    Colon polyps Mother 6   Heart failure Father    Hypertension Father    Hyperlipidemia Father    Diabetes Father    Alcohol abuse Sister    Anxiety disorder Maternal Aunt    Depression Maternal Aunt    Alcohol abuse Maternal Aunt    Alcohol abuse Maternal Uncle    Alcohol abuse Maternal Grandfather  Breast cancer Maternal Grandmother    Colon cancer Neg Hx    Esophageal cancer Neg Hx    Stomach cancer Neg Hx    Rectal cancer Neg Hx      Current Outpatient Medications:    ALPRAZolam (XANAX) 0.5 MG tablet, Take by mouth., Disp: , Rfl:    ALPRAZolam (XANAX) 1 MG tablet, Take 1 mg by mouth as needed for anxiety or sleep., Disp: , Rfl:    busPIRone (BUSPAR) 30 MG tablet, Take 1 tablet (30 mg total) by mouth 2 (two) times daily., Disp: 60 tablet, Rfl: 3   dicyclomine (BENTYL) 10 MG capsule, Take 1 capsule (10 mg total) by mouth every 6 (six) hours as needed (abdominal pain)., Disp: 30 capsule, Rfl: 1   DULoxetine (CYMBALTA) 60 MG capsule, Take 1 capsule (60 mg total) by mouth 2 (two) times daily., Disp: 270 capsule, Rfl: 2   esomeprazole (NEXIUM) 40 MG capsule, TAKE 1 CAPSULE BY MOUTH TWICE  DAILY BEFORE MEALS, Disp: 180 capsule,  Rfl: 3   fluticasone (FLONASE) 50 MCG/ACT nasal spray, SPRAY ONE SPRAY IN EACH NOSTRIL ONCE DAILY AS NEEDED FOR ALLERGIES OR RHINITIS, Disp: 16 mL, Rfl: 2   ondansetron (ZOFRAN-ODT) 8 MG disintegrating tablet, Take 1 tablet (8 mg total) by mouth every 8 (eight) hours as needed for nausea or vomiting., Disp: 20 tablet, Rfl: 0   sucralfate (CARAFATE) 1 g tablet, Take 1 tablet (1 g total) by mouth every 6 (six) hours as needed. Slowly dissolve 1 tablet in 1 Tablespoon of distilled water prior to taking, Disp: 60 tablet, Rfl: 1   tiZANidine (ZANAFLEX) 4 MG tablet, Take 4 mg by mouth 3 (three) times daily., Disp: , Rfl:    traZODone (DESYREL) 50 MG tablet, TAKE 2 TABLETS BY MOUTH AT  BEDTIME, Disp: 90 tablet, Rfl: 0  Review of Systems:  Negative unless indicated in HPI.   Physical Exam: Vitals:   04/12/22 1658  BP: 110/70  Pulse: 80  Temp: 98.1 F (36.7 C)  TempSrc: Oral  SpO2: 100%  Weight: 133 lb 12.8 oz (60.7 kg)  Height: 5' 3"$  (1.6 m)    Body mass index is 23.7 kg/m.   Physical Exam Vitals reviewed.  Constitutional:      General: She is not in acute distress.    Appearance: Normal appearance. She is not ill-appearing, toxic-appearing or diaphoretic.  HENT:     Head: Normocephalic.     Right Ear: Tympanic membrane, ear canal and external ear normal. There is no impacted cerumen.     Left Ear: Tympanic membrane, ear canal and external ear normal. There is no impacted cerumen.     Nose: Nose normal.     Mouth/Throat:     Mouth: Mucous membranes are moist.     Pharynx: Oropharynx is clear. No oropharyngeal exudate or posterior oropharyngeal erythema.  Eyes:     General: No scleral icterus.       Right eye: No discharge.        Left eye: No discharge.     Conjunctiva/sclera: Conjunctivae normal.     Pupils: Pupils are equal, round, and reactive to light.  Neck:     Vascular: No carotid bruit.  Cardiovascular:     Rate and Rhythm: Normal rate and regular rhythm.      Pulses: Normal pulses.     Heart sounds: Normal heart sounds.  Pulmonary:     Effort: Pulmonary effort is normal. No respiratory distress.     Breath sounds:  Normal breath sounds.  Abdominal:     General: Abdomen is flat. Bowel sounds are normal.     Palpations: Abdomen is soft.  Musculoskeletal:        General: Normal range of motion.     Cervical back: Normal range of motion.  Skin:    General: Skin is warm and dry.  Neurological:     General: No focal deficit present.     Mental Status: She is alert and oriented to person, place, and time. Mental status is at baseline.  Psychiatric:        Mood and Affect: Mood normal.        Behavior: Behavior normal.        Thought Content: Thought content normal.        Judgment: Judgment normal.      Subsequent Medicare wellness visit   1. Risk factors, based on past  M,S,F -  age only   2.  Physical activities: Dietary issues and exercise activities discussed:      3.  Depression/mood:  [place] Office Visit from 04/13/2022 in [place] at [facility] Total Score 4        4.  ADL's:    04/12/2022    4:57 PM 04/12/2022   12:38 PM  In your present state of health, do you have any difficulty performing the following activities:  Hearing? 1 0  Vision? 1 0  Difficulty concentrating or making decisions? 1 0  Walking or climbing stairs? 0 0  Dressing or bathing? 0 0  Doing errands, shopping? 0 0  Preparing Food and eating ? N N  Using the Toilet? N N  In the past six months, have you accidently leaked urine? N N  Do you have problems with loss of bowel control? N N  Managing your Medications? N N  Managing your Finances? N N  Housekeeping or managing your Housekeeping? N N     5.  Fall risk:     06/24/2021   11:04 AM 07/20/2021   11:45 AM 09/26/2021    4:15 PM 04/12/2022   12:38 PM 04/13/2022    8:10 AM  Fall Risk  Falls in the past year? 0  0 0 0  Was there an injury with Fall? 0  0 0 0   Fall Risk Category Calculator 0  0 0 0  Fall Risk Category (Retired) Low  Low    (RETIRED) Patient Fall Risk Level Low fall risk Low fall risk Low fall risk    Patient at Risk for Falls Due to No Fall Risks  No Fall Risks  No Fall Risks  Fall risk Follow up Falls evaluation completed  Falls evaluation completed  Falls evaluation completed     6.  Home safety: No problems identified   7.  Height weight, and visual acuity: height and weight as above, vision/hearing: Vision Screening   Right eye Left eye Both eyes  Without correction     With correction 20/25 20/25 20/25 $     8.  Counseling: Counseling given: Not Answered    9. Lab orders based on risk factors: Laboratory update will be reviewed   10. Cognitive assessment:        04/12/2022    5:02 PM  6CIT Screen  What Year? 0 points  What month? 0 points  Count back from 20 0 points  Months in reverse 0 points  Repeat phrase 0 points     11.  Screening: Patient provided with a written and personalized 5-10 year screening schedule in the AVS. Health Maintenance  Topic Date Due   COVID-19 Vaccine (4 - 2023-24 season) 04/28/2022*   Zoster (Shingles) Vaccine (1 of 2) 07/11/2022*   Pap Smear  11/21/2022   Medicare Annual Wellness Visit  04/14/2023   Mammogram  11/16/2023   DTaP/Tdap/Td vaccine (3 - Td or Tdap) 01/07/2024   Colon Cancer Screening  05/09/2026   Flu Shot  Completed   Hepatitis C Screening: USPSTF Recommendation to screen - Ages 18-79 yo.  Completed   HIV Screening  Completed   HPV Vaccine  Aged Out  *Topic was postponed. The date shown is not the original due date.    12. Provider List Update: Patient Care Team    Relationship Specialty Notifications Start End  Isaac Bliss, Rayford Halsted, MD PCP - General Internal Medicine  01/21/20   Buford Dresser, MD PCP - Cardiology Cardiology  03/07/22   Chucky May, MD  Psychiatry  05/23/10   Lennon Alstrom, MD  Neurology  05/23/10   Hobson-Webb, Preston Fleeting, MD  Neurology  01/02/12      13. Advance Directives: Does Patient Have a Medical Advance Directive?: Yes Type of Advance Directive: Healthcare Power of Attorney, Living will, Out of facility DNR (pink MOST or yellow form) Copy of [place] in Chart?: No - copy requested  14. Opioids: Patient is not on any opioid prescriptions and has no risk factors for a substance use disorder.   15.   Goals      travel         I have personally reviewed and noted the following in the patient's chart:   Medical and social history Use of alcohol, tobacco or illicit drugs  Current medications and supplements Functional ability and status Nutritional status Physical activity Advanced directives List of other physicians Hospitalizations, surgeries, and ER visits in previous 12 months Vitals Screenings to include cognitive, depression, and falls Referrals and appointments  In addition, I have reviewed and discussed with patient certain preventive protocols, quality metrics, and best practice recommendations. A written personalized care plan for preventive services as well as general preventive health recommendations were provided to patient.  Impression and Plan:  Encounter for preventive health examination  IGT (impaired glucose tolerance) - Plan: CBC with Differential/Platelet, Comprehensive metabolic panel, Hemoglobin A1c  Iron deficiency anemia, unspecified iron deficiency anemia type  Hyperglycemia  Hyperlipidemia, unspecified hyperlipidemia type - Plan: Lipid panel  Encounter for screening for osteoporosis - Plan: DG Bone Density  Immunization due  Severe recurrent major depression without psychotic features ([place]) - Plan: TSH, Vitamin B12, Vitamin D, 25-hydroxy  -Recommend routine eye and dental care. -Healthy lifestyle discussed in detail. -Labs to be updated today. -Prostate cancer screening: Not applicable Health Maintenance  Topic Date Due    COVID-19 Vaccine (4 - 2023-24 season) 04/28/2022*   Zoster (Shingles) Vaccine (1 of 2) 07/11/2022*   Pap Smear  11/21/2022   Medicare Annual Wellness Visit  04/14/2023   Mammogram  11/16/2023   DTaP/Tdap/Td vaccine (3 - Td or Tdap) 01/07/2024   Colon Cancer Screening  05/09/2026   Flu Shot  Completed   Hepatitis C Screening: USPSTF Recommendation to screen - Ages 18-79 yo.  Completed   HIV Screening  Completed   HPV Vaccine  Aged Out  *Topic was postponed. The date shown is not the original due date.     -PCV 20 in office today. -Advised to  get RSV and shingles at pharmacy. -ok to proceed with ischiofemoral nerve impingement surgery without further workup. She has already been cleared by cardiology.    Lelon Frohlich, MD [facility]

## 2022-04-13 NOTE — Addendum Note (Signed)
Addended by: Westley Hummer B on: 04/13/2022 09:51 AM   Modules accepted: Orders

## 2022-04-15 ENCOUNTER — Encounter: Payer: Self-pay | Admitting: Internal Medicine

## 2022-04-19 ENCOUNTER — Encounter: Payer: Self-pay | Admitting: Internal Medicine

## 2022-04-19 ENCOUNTER — Other Ambulatory Visit: Payer: Self-pay | Admitting: Internal Medicine

## 2022-04-19 ENCOUNTER — Ambulatory Visit: Payer: Medicare Other | Admitting: Behavioral Health

## 2022-04-19 DIAGNOSIS — E559 Vitamin D deficiency, unspecified: Secondary | ICD-10-CM

## 2022-04-19 DIAGNOSIS — Z1231 Encounter for screening mammogram for malignant neoplasm of breast: Secondary | ICD-10-CM

## 2022-04-19 MED ORDER — VITAMIN D (ERGOCALCIFEROL) 1.25 MG (50000 UNIT) PO CAPS: 50000.0000 [IU] | ORAL_CAPSULE | ORAL | 0 refills | Status: DC

## 2022-04-20 ENCOUNTER — Encounter: Payer: Self-pay | Admitting: Internal Medicine

## 2022-04-20 ENCOUNTER — Ambulatory Visit: Payer: Medicare Other | Admitting: Psychology

## 2022-04-21 DIAGNOSIS — H93291 Other abnormal auditory perceptions, right ear: Secondary | ICD-10-CM | POA: Diagnosis not present

## 2022-04-21 DIAGNOSIS — Z9889 Other specified postprocedural states: Secondary | ICD-10-CM | POA: Diagnosis not present

## 2022-04-24 DIAGNOSIS — M76892 Other specified enthesopathies of left lower limb, excluding foot: Secondary | ICD-10-CM | POA: Diagnosis not present

## 2022-04-24 DIAGNOSIS — M7602 Gluteal tendinitis, left hip: Secondary | ICD-10-CM | POA: Diagnosis not present

## 2022-04-24 DIAGNOSIS — M25852 Other specified joint disorders, left hip: Secondary | ICD-10-CM | POA: Diagnosis not present

## 2022-04-24 DIAGNOSIS — Z79899 Other long term (current) drug therapy: Secondary | ICD-10-CM | POA: Diagnosis not present

## 2022-04-24 DIAGNOSIS — G8929 Other chronic pain: Secondary | ICD-10-CM | POA: Diagnosis not present

## 2022-04-24 DIAGNOSIS — K219 Gastro-esophageal reflux disease without esophagitis: Secondary | ICD-10-CM | POA: Diagnosis not present

## 2022-04-24 DIAGNOSIS — M25552 Pain in left hip: Secondary | ICD-10-CM | POA: Diagnosis not present

## 2022-04-25 DIAGNOSIS — M76892 Other specified enthesopathies of left lower limb, excluding foot: Secondary | ICD-10-CM | POA: Diagnosis not present

## 2022-04-25 DIAGNOSIS — K219 Gastro-esophageal reflux disease without esophagitis: Secondary | ICD-10-CM | POA: Diagnosis not present

## 2022-04-25 DIAGNOSIS — G8929 Other chronic pain: Secondary | ICD-10-CM | POA: Diagnosis not present

## 2022-04-25 DIAGNOSIS — M25852 Other specified joint disorders, left hip: Secondary | ICD-10-CM | POA: Diagnosis not present

## 2022-04-25 DIAGNOSIS — Z79899 Other long term (current) drug therapy: Secondary | ICD-10-CM | POA: Diagnosis not present

## 2022-04-27 ENCOUNTER — Ambulatory Visit: Payer: Medicare Other

## 2022-04-28 ENCOUNTER — Encounter: Payer: Self-pay | Admitting: Internal Medicine

## 2022-04-28 ENCOUNTER — Ambulatory Visit: Payer: Medicare Other

## 2022-05-02 ENCOUNTER — Ambulatory Visit: Payer: Medicare Other | Attending: Surgery

## 2022-05-02 ENCOUNTER — Other Ambulatory Visit: Payer: Self-pay

## 2022-05-02 DIAGNOSIS — M6281 Muscle weakness (generalized): Secondary | ICD-10-CM | POA: Diagnosis not present

## 2022-05-02 DIAGNOSIS — M25552 Pain in left hip: Secondary | ICD-10-CM | POA: Insufficient documentation

## 2022-05-02 DIAGNOSIS — M76892 Other specified enthesopathies of left lower limb, excluding foot: Secondary | ICD-10-CM | POA: Diagnosis not present

## 2022-05-02 DIAGNOSIS — R252 Cramp and spasm: Secondary | ICD-10-CM | POA: Diagnosis not present

## 2022-05-02 DIAGNOSIS — R262 Difficulty in walking, not elsewhere classified: Secondary | ICD-10-CM | POA: Insufficient documentation

## 2022-05-02 NOTE — Therapy (Signed)
OUTPATIENT PHYSICAL THERAPY LOWER EXTREMITY EVALUATION   Patient Name: Jordan [name] MRN: [id number] DOB:1960-04-09, 62 y.o., female Today's Date: 05/02/2022  END OF SESSION:  PT End of Session - 05/02/22 1037     Visit Number 1    Date for PT Re-Evaluation 06/27/22    Authorization Type UHC Medicare    Progress Note Due on Visit 10    PT Start Time 1020    PT Stop Time 1100    PT Time Calculation (min) 40 min    Activity Tolerance Patient tolerated treatment well    Behavior During Therapy WFL for tasks assessed/performed             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    Lump or mass in breast    Meningitis, unspecified(322.9)    Oral thrush    Other specified glaucoma    Perforation of tympanic membrane, unspecified    Radial styloid tenosynovitis    Seizures ([place])    Past Surgical History:  Procedure Laterality Date   ADENOIDECTOMY     ANKLE SURGERY  1992   right ankle - scar tissue   DILATION AND CURETTAGE OF UTERUS  2006   History of Menometrorrhagia   INNER EAR SURGERY  1966 to 1986   6 surgeries on right ear   LAPAROSCOPY  1993   normal   REDUCTION MAMMAPLASTY Bilateral 2018   uterine ablation  2011   WRIST SURGERY  2001   for de-quervaine - bilateral wrists - tendonitis   Patient Active Problem List   Diagnosis Date Noted   Vitamin D deficiency 04/19/2022   IGT (impaired glucose tolerance)  03/24/2021   Iron deficiency anemia 03/24/2021   Hyperlipidemia 03/24/2021   Low back pain 10/19/2020   Severe recurrent major depression without psychotic features ([place]) 09/27/2015    Class: Chronic   Fibromyalgia 04/03/2013   Back pain, lumbosacral 04/03/2013   Degeneration of cervical intervertebral disc 04/03/2013   Dyspepsia and disorder of function of stomach 03/16/2012   Routine health maintenance 01/03/2012   Mild cognitive impairment with memory loss 01/03/2012   Cough 07/03/2011   Bilateral dry eyes 05/13/2011   Back pain, thoracic 06/15/2010   MULTIPLE SCLEROSIS, PROGRESSIVE/RELAPSING 06/02/2009   Incontinence without sensory awareness 06/01/2009   NASAL POLYP 05/14/2009   Panic disorder without agoraphobia 04/25/2007   Carpal tunnel syndrome 04/25/2007   NECK PAIN, CHRONIC 02/19/2007   HAY FEVER 02/18/2007   ANEMIA-NOS 12/05/2006   Depression, recurrent ([place]) 12/05/2006   GLAUCOMA NEC 12/05/2006   TYMPANIC MEMBRANE PERFORATION 12/05/2006   FIBROCYSTIC BREAST DISEASE 12/05/2006   DE QUERVAIN'S TENOSYNOVITIS 12/05/2006   Headache 12/05/2006   BULIMIA, HX OF 12/05/2006   ANOREXIA, HX OF 12/05/2006    PCP: Isaac Bliss, Rayford Halsted, MD   REFERRING  PROVIDER: Concepcion Elk  REFERRING DIAG: [phone] (ICD-10-CM) - Other specified enthesopathies of left lower limb, excluding foot  THERAPY DIAG:  Pain in left hip - Plan: PT plan of care cert/re-cert  Muscle weakness (generalized) - Plan: PT plan of care cert/re-cert  Difficulty in walking, not elsewhere classified - Plan: PT plan of care cert/re-cert  Cramp and spasm - Plan: PT plan of care cert/re-cert  Rationale for Evaluation and Treatment: Rehabilitation  ONSET DATE: 04/12/2022  SUBJECTIVE:   SUBJECTIVE STATEMENT: Patient reports pain in left buttock for approx 3 years.  She was very active prior to this and enjoyed kayaking, hiking, going to park prior to this.  She even played basketball with the  kids in the neighborhood.  She enjoys traveling and has always been very active.  She states she went out of work in 2013 due to [employer].  She suffered from depression and anxiety since that time.  Since surgery, she has felt significant improvement in her hip pain.  Prior to surgery, her pain was at the upper buttock area.  She states she never really had pain in the back of leg.    PERTINENT HISTORY: OPERATIVE REPORT:  The patient was positioned prone and bone prominences were padded. The surgical site was prepped and draped in a sterile fashion. Ioband drapes were used to seal the perineum from the surgical site. Timeout was performed. Landmarks were identified. A 65m incision was made 2 cm distal to the ischial tuberosity just over the hamstring tendon. The endoscope was inserted and pump turned on to 473mg. An accessory portal was made using an outside-in technique under direct visualization. This portal was made in the gluteal crease 4cm lateral the viewing portal. A shaver and RF device were used to expose the ischial tunnel, removing the ischial bursa. The sciatic nerve was identified and protected.  Next we identified the lesser trochanter. We placed another portal using an outside in technique to retract and protect the sciatic nerve. We used an RF device to carefully expose the lesser trochanter through the quadratus. We were careful to avoid any damage to the femoral circumflex vessels proximally and the perforating vessels distally. We then used a bur to remove the lesser trochanter. The resulted in an adequate ischiofemoral distance. Hemostasis was obtained and we did not experience any significant bleeding throughout the case. The sciatic nerve was intact without injury. We then removed the endoscope from the space and closed the incisions with nylon sutures.  Post op instructions:  You should use crutches or a walker as needed only. Do not hold the leg off the ground. Walk with a normal gait  using the crutches or walker to take the weight off of the operative leg. Extremity elevation for the first 72 hours is also encouraged to minimize the swelling. You can stop using the crutches when you are confident and stable to walk.  17. Please minimize active hip flexion for 2 weeks - you should use your arms or other leg to lift your operative leg.  PAIN:  Are you having pain?  1/10 currently, and 1/10 at worst in past 24 hours   PRECAUTIONS: Post op instructions:  You should use crutches or a walker as needed only. Do not hold the leg off the ground. Walk with a normal gait using the crutches or walker to take the weight off of the operative leg. Extremity elevation for the first 72 hours is also encouraged to minimize the swelling. You  can stop using the crutches when you are confident and stable to walk.  17. Please minimize active hip flexion for 2 weeks - you should use your arms or other leg to lift your operative leg.  (05/08/22)  WEIGHT BEARING RESTRICTIONS: Yes WBAT  FALLS:  Has patient fallen in last 6 months? No  LIVING ENVIRONMENT: Lives with: lives alone Lives in: House/apartment Stairs: Yes: External: 2 steps; grab bar on left going up Has following equipment at home: Walker - 2 wheeled  OCCUPATION: disabled  PLOF: Independent, Independent with basic ADLs, Independent with household mobility without device, Independent with community mobility without device, Independent with homemaking with ambulation, Independent with gait, and Independent with transfers  PATIENT GOALS: to be able to get back to being active, and get my life back  NEXT MD VISIT: 05/05/22  OBJECTIVE:    PATIENT SURVEYS:  FOTO 44, predicted 24  COGNITION: Overall cognitive status: Within functional limits for tasks assessed     SENSATION: WFL   MUSCLE LENGTH: Hamstrings: held due to recent sx  POSTURE: No Significant postural limitations  PALPATION: Held due to healing  LOWER  EXTREMITY ROM:  WFL  LOWER EXTREMITY MMT:  Deferred due to recent sx on left,  right LE generally 4/5   FUNCTIONAL TESTS:  5 times sit to stand: 13.26 sec Timed up and go (TUG): 29.47 sec  GAIT: Distance walked:  30 feet Assistive device utilized: Walker - 2 wheeled Level of assistance: Modified independence Comments: antalgic, limited WB   TODAY'S TREATMENT:                                                                                                                              DATE: 05/02/22 Initiated HEP and initial eval completed    PATIENT EDUCATION:  Education details:  Initiated HEP Person educated: Patient Education method: Consulting civil engineer, Media planner, Verbal cues, and Handouts Education comprehension: verbalized understanding, returned demonstration, and verbal cues required  HOME EXERCISE PROGRAM: Access Code: [email or web address] URL: [email or web address] Date: 05/02/2022 Prepared by: Candyce Churn  Exercises - Quadruped Rocking Backward  - 1 x daily - 7 x weekly - 3 sets - 10 reps - Sidelying Hip Abduction  - 1 x daily - 7 x weekly - 3 sets - 10 reps  ASSESSMENT:  CLINICAL IMPRESSION: Patient is a 62 y.o. female who was seen today for physical therapy evaluation and treatment for post of left ischiofemoral impingement decompression (shaved lesser trochanter down to create ischiofemoral space).   OBJECTIVE IMPAIRMENTS: Abnormal gait, decreased activity tolerance, difficulty walking, decreased ROM, decreased strength, increased fascial restrictions, impaired perceived functional ability, increased muscle spasms, impaired flexibility, and pain.   ACTIVITY LIMITATIONS: carrying, lifting, bending, sitting, standing, squatting, sleeping, stairs, transfers, bed mobility, bathing, toileting, dressing, hygiene/grooming, and caring for others  PARTICIPATION LIMITATIONS: meal prep, cleaning, laundry, driving, shopping, community activity, occupation, yard  work, and church  PERSONAL FACTORS: Fitness, Past/current experiences, Time since onset of injury/illness/exacerbation, and  1-2 comorbidities: anxiety, depression and fibromyalgia  are also affecting patient's functional outcome.   REHAB POTENTIAL: Good  CLINICAL DECISION MAKING: Evolving/moderate complexity  EVALUATION COMPLEXITY: Moderate   GOALS: Goals reviewed with patient? Yes  SHORT TERM GOALS: Target date: 05/30/2022   Pain report to be no greater than 4/10  Baseline: Goal status: INITIAL  2.  Patient will be independent with initial HEP  Baseline:  Goal status: INITIAL   LONG TERM GOALS: Target date: 06/27/2022   Patient to report pain no greater than 2/10  Baseline:  Goal status: INITIAL  2.  Patient to be independent with advanced HEP  Baseline:  Goal status: INITIAL  3.  Gait to normalize to heel to toe progression Baseline:  Goal status: INITIAL  4.  Patient to be able to stand or walk for at least 15 min without leg pain  Baseline:  Goal status: INITIAL  5.  Patient to be able to sleep through the night  Baseline:  Goal status: INITIAL  6.  Patient to be able to resume all ADL's and IADL's independently Baseline:  Goal status: INITIAL   PLAN:  PT FREQUENCY: 1-2x/week  PT DURATION: 8 weeks  PLANNED INTERVENTIONS: Therapeutic exercises, Therapeutic activity, Neuromuscular re-education, Balance training, Gait training, Patient/Family education, Self Care, Joint mobilization, Stair training, DME instructions, Aquatic Therapy, Dry Needling, Electrical stimulation, Cryotherapy, Moist heat, scar mobilization, Taping, Ionotophoresis '4mg'$ /ml Dexamethasone, Manual therapy, and Re-evaluation  PLAN FOR NEXT SESSION: Review HEP, quad rehab,  NO SLR OR ACTIVE HIP FLEXION FOR 2 WEEKS POST OP (05/08/22), WBAT with appropriate abductor strength.     Anderson Malta B. Renatha Rosen, PT 05/02/22 2:09 PM  [facility] [address], [place], [place] [address] Phone # [phone] Fax [phone]

## 2022-05-04 ENCOUNTER — Ambulatory Visit: Payer: Medicare Other

## 2022-05-04 DIAGNOSIS — M6281 Muscle weakness (generalized): Secondary | ICD-10-CM | POA: Diagnosis not present

## 2022-05-04 DIAGNOSIS — R262 Difficulty in walking, not elsewhere classified: Secondary | ICD-10-CM

## 2022-05-04 DIAGNOSIS — M25552 Pain in left hip: Secondary | ICD-10-CM

## 2022-05-04 DIAGNOSIS — R252 Cramp and spasm: Secondary | ICD-10-CM | POA: Diagnosis not present

## 2022-05-04 DIAGNOSIS — G8929 Other chronic pain: Secondary | ICD-10-CM

## 2022-05-04 DIAGNOSIS — M76892 Other specified enthesopathies of left lower limb, excluding foot: Secondary | ICD-10-CM | POA: Diagnosis not present

## 2022-05-04 NOTE — Therapy (Signed)
OUTPATIENT PHYSICAL THERAPY LOWER EXTREMITY TREATMENT NOTE   Patient Name: Jordan [name] MRN: [id number] DOB:[birth year], 62 y.o., female Today's Date: 05/04/2022  END OF SESSION:  PT End of Session - 05/04/22 1101     Visit Number 2    Date for PT Re-Evaluation 06/27/22    Authorization Type [employer]    Progress Note Due on Visit 10    PT Start Time 1101    PT Stop Time 1145    PT Time Calculation (min) 44 min    Activity Tolerance Patient tolerated treatment well    Behavior During Therapy WFL for tasks assessed/performed             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    Lump or mass in breast    Meningitis, unspecified(322.9)    Oral thrush    Other specified glaucoma    Perforation of tympanic membrane, unspecified    Radial styloid tenosynovitis    Seizures ([place])    Past Surgical History:  Procedure Laterality Date   ADENOIDECTOMY     ANKLE SURGERY  1992   right ankle - scar tissue   DILATION AND CURETTAGE OF UTERUS  2006   History of Menometrorrhagia   INNER EAR SURGERY  1966 to 1986   6 surgeries on right ear   LAPAROSCOPY  1993   normal   REDUCTION MAMMAPLASTY Bilateral 2018   uterine ablation  2011   WRIST SURGERY  2001   for de-quervaine - bilateral wrists - tendonitis   Patient Active Problem List   Diagnosis Date Noted   Vitamin D deficiency 04/19/2022   IGT (impaired glucose  tolerance) 03/24/2021   Iron deficiency anemia 03/24/2021   Hyperlipidemia 03/24/2021   Low back pain 10/19/2020   Severe recurrent major depression without psychotic features ([place]) 09/27/2015    Class: Chronic   Fibromyalgia 04/03/2013   Back pain, lumbosacral 04/03/2013   Degeneration of cervical intervertebral disc 04/03/2013   Dyspepsia and disorder of function of stomach 03/16/2012   Routine health maintenance 01/03/2012   Mild cognitive impairment with memory loss 01/03/2012   Cough 07/03/2011   Bilateral dry eyes 05/13/2011   Back pain, thoracic 06/15/2010   MULTIPLE SCLEROSIS, PROGRESSIVE/RELAPSING 06/02/2009   Incontinence without sensory awareness 06/01/2009   NASAL POLYP 05/14/2009   Panic disorder without agoraphobia 04/25/2007   Carpal tunnel syndrome 04/25/2007   NECK PAIN, CHRONIC 02/19/2007   HAY FEVER 02/18/2007   ANEMIA-NOS 12/05/2006   Depression, recurrent ([place]) 12/05/2006   GLAUCOMA NEC 12/05/2006   TYMPANIC MEMBRANE PERFORATION 12/05/2006   FIBROCYSTIC BREAST DISEASE 12/05/2006   DE QUERVAIN'S TENOSYNOVITIS 12/05/2006   Headache 12/05/2006   BULIMIA, HX OF 12/05/2006   ANOREXIA, HX OF 12/05/2006    PCP: Isaac Bliss, Rayford Halsted, MD  REFERRING PROVIDER: Concepcion Elk  REFERRING DIAG: [id number] (ICD-10-CM) - Other specified enthesopathies of left lower limb, excluding foot  THERAPY DIAG:  Pain in left hip  Muscle weakness (generalized)  Difficulty in walking, not elsewhere classified  Cramp and spasm  Chronic left-sided low back pain with left-sided sciatica  Rationale for Evaluation and Treatment: Rehabilitation  ONSET DATE: 04/12/2022  SUBJECTIVE:   SUBJECTIVE STATEMENT: Patient arrives without walker.  She denies any pain and feels she is doing more everyday.  "I was able to walk my dog and funny enough, "scoop poop" which is something I really struggled to do for a while"  "I cry everyday because I feel so blessed that I am  getting my life back".  PERTINENT HISTORY: OPERATIVE REPORT:  The patient was positioned prone and bone prominences were padded. The surgical site was prepped and draped in a sterile fashion. Ioband drapes were used to seal the perineum from the surgical site. Timeout was performed. Landmarks were identified. A 46m incision was made 2 cm distal to the ischial tuberosity just over the hamstring tendon. The endoscope was inserted and pump turned on to 437mg. An accessory portal was made using an outside-in technique under direct visualization. This portal was made in the gluteal crease 4cm lateral the viewing portal. A shaver and RF device were used to expose the ischial tunnel, removing the ischial bursa. The sciatic nerve was identified and protected.  Next we identified the lesser trochanter. We placed another portal using an outside in technique to retract and protect the sciatic nerve. We used an RF device to carefully expose the lesser trochanter through the quadratus. We were careful to avoid any damage to the femoral circumflex vessels proximally and the perforating vessels distally. We then used a bur to remove the lesser trochanter. The resulted in an adequate ischiofemoral distance. Hemostasis was obtained and we did not experience any significant bleeding throughout the case. The sciatic nerve was intact without injury. We then removed the endoscope from the space and closed the incisions with nylon sutures.  Post op instructions:  You should use crutches or a walker as needed only. Do not hold the leg off the ground. Walk with a normal gait using the crutches or walker to take the weight off of the operative leg. Extremity elevation for the first 72 hours is also encouraged to minimize the swelling. You can stop using the crutches when you are confident and stable to walk.  17. Please minimize active hip flexion for 2 weeks - you should use your arms or other leg to lift your operative leg.   PAIN:  Are you having pain?  1/10 currently, and 1/10 at worst in past 24 hours   PRECAUTIONS: Post op instructions:  You should use crutches or a walker as needed only. Do not hold the leg off the ground. Walk with a normal gait using the crutches or walker to take the weight off of the operative leg. Extremity elevation for the first 72 hours is also encouraged to minimize the swelling. You can stop using the crutches when you are confident and stable to walk.  17. Please minimize active hip flexion for 2 weeks - you should use your arms or other leg to lift your operative leg.  (05/08/22)  WEIGHT BEARING RESTRICTIONS: Yes WBAT  FALLS:  Has patient fallen in last 6 months? No  LIVING ENVIRONMENT: Lives with: lives alone Lives in: House/apartment Stairs: Yes: External: 2 steps; grab bar  on left going up Has following equipment at home: Gilford Rile - 2 wheeled  OCCUPATION: disabled  PLOF: Independent, Independent with basic ADLs, Independent with household mobility without device, Independent with community mobility without device, Independent with homemaking with ambulation, Independent with gait, and Independent with transfers  PATIENT GOALS: to be able to get back to being active, and get my life back  NEXT MD VISIT: 05/05/22  OBJECTIVE:    PATIENT SURVEYS:  FOTO 44, predicted 20  COGNITION: Overall cognitive status: Within functional limits for tasks assessed     SENSATION: WFL   MUSCLE LENGTH: Hamstrings: held due to recent sx  POSTURE: No Significant postural limitations  PALPATION: Held due to healing  LOWER EXTREMITY ROM:  WFL  LOWER EXTREMITY MMT:  Deferred due to recent sx on left,  right LE generally 4/5   FUNCTIONAL TESTS:  5 times sit to stand: 13.26 sec Timed up and go (TUG): 29.47 sec  GAIT: Distance walked:  30 feet Assistive device utilized: Environmental consultant - 2 wheeled Level of assistance: Modified independence Comments: antalgic, limited  WB   TODAY'S TREATMENT:                                                                                                                              DATE: 05/04/22 Nustep (instructed to use arms only and legs just ride) x 8 min  Seated hip abduction with yellow band x 20 both Seated LAQ x 20 both Supine hip abduction x 20 with towel under heel (left) Hook lying pelvic tilt x 20 Side lying hip abduction x 20 left Side lying clam x 20 left Quadruped rocking x 20  DATE: 05/02/22 Initiated HEP and initial eval completed    PATIENT EDUCATION:  Education details:  Initiated HEP Person educated: Patient Education method: Consulting civil engineer, Media planner, Verbal cues, and Handouts Education comprehension: verbalized understanding, returned demonstration, and verbal cues required  HOME EXERCISE PROGRAM: Access Code: [email or web address] URL: [email or web address] Date: 05/04/2022 Prepared by: Candyce Churn  Exercises - Quadruped Rocking Backward  - 1 x daily - 7 x weekly - 3 sets - 10 reps - Sidelying Hip Abduction  - 1 x daily - 7 x weekly - 3 sets - 10 reps - Supine Pelvic Tilt  - 1 x daily - 7 x weekly - 3 sets - 10 reps - Supine Hip Abduction AROM  - 1 x daily - 7 x weekly - 3 sets - 10 reps - Clamshell  - 1 x daily - 7 x weekly - 3 sets - 10 reps  ASSESSMENT:  CLINICAL IMPRESSION: Jordan [name] is progressing very well.  She is well motivated and compliant.  She has little to no pain.  Her only pain today was mild discomfort with LAQ and this was at the ischial tuberosity where she has some known hamstring fraying that was cleaned up during surgery.  We will take caution for avoiding this becoming aggrevated.  She ambulates without walker most  of the time now.  She still has slightly reduced step length and antalgic gait but is very steady and can ambulate without a.d. safely.  She would benefit from continuing skilled PT for post op rehab.     OBJECTIVE IMPAIRMENTS: Abnormal gait,  decreased activity tolerance, difficulty walking, decreased ROM, decreased strength, increased fascial restrictions, impaired perceived functional ability, increased muscle spasms, impaired flexibility, and pain.   ACTIVITY LIMITATIONS: carrying, lifting, bending, sitting, standing, squatting, sleeping, stairs, transfers, bed mobility, bathing, toileting, dressing, hygiene/grooming, and caring for others  PARTICIPATION LIMITATIONS: meal prep, cleaning, laundry, driving, shopping, community activity, occupation, yard work, and church  PERSONAL FACTORS: Fitness, Past/current experiences, Time since onset of injury/illness/exacerbation, and 1-2 comorbidities: anxiety, depression and fibromyalgia  are also affecting patient's functional outcome.   REHAB POTENTIAL: Good  CLINICAL DECISION MAKING: Evolving/moderate complexity  EVALUATION COMPLEXITY: Moderate   GOALS: Goals reviewed with patient? Yes  SHORT TERM GOALS: Target date: 05/30/2022   Pain report to be no greater than 4/10  Baseline: Goal status: INITIAL  2.  Patient will be independent with initial HEP  Baseline:  Goal status: INITIAL   LONG TERM GOALS: Target date: 06/27/2022   Patient to report pain no greater than 2/10  Baseline:  Goal status: INITIAL  2.  Patient to be independent with advanced HEP  Baseline:  Goal status: INITIAL  3.  Gait to normalize to heel to toe progression Baseline:  Goal status: INITIAL  4.  Patient to be able to stand or walk for at least 15 min without leg pain  Baseline:  Goal status: INITIAL  5.  Patient to be able to sleep through the night  Baseline:  Goal status: INITIAL  6.  Patient to be able to resume all ADL's and IADL's independently Baseline:  Goal status: INITIAL   PLAN:  PT FREQUENCY: 1-2x/week  PT DURATION: 8 weeks  PLANNED INTERVENTIONS: Therapeutic exercises, Therapeutic activity, Neuromuscular re-education, Balance training, Gait training, Patient/Family  education, Self Care, Joint mobilization, Stair training, DME instructions, Aquatic Therapy, Dry Needling, Electrical stimulation, Cryotherapy, Moist heat, scar mobilization, Taping, Ionotophoresis '4mg'$ /ml Dexamethasone, Manual therapy, and Re-evaluation  PLAN FOR NEXT SESSION: Review HEP, quad rehab,  NO SLR OR ACTIVE HIP FLEXION FOR 2 WEEKS POST OP (05/08/22), WBAT with appropriate abductor strength.     Anderson Malta B. Lamar Naef, PT 05/04/22 11:53 AM  [facility] [address] [place], [place] [address] Phone # [phone] Fax [phone]

## 2022-05-08 ENCOUNTER — Ambulatory Visit: Payer: Medicare Other | Admitting: Physical Therapy

## 2022-05-09 ENCOUNTER — Ambulatory Visit: Payer: Medicare Other

## 2022-05-10 ENCOUNTER — Ambulatory Visit: Payer: Medicare Other | Admitting: Physical Therapy

## 2022-05-11 ENCOUNTER — Ambulatory Visit: Payer: Medicare Other

## 2022-05-11 DIAGNOSIS — M6281 Muscle weakness (generalized): Secondary | ICD-10-CM

## 2022-05-11 DIAGNOSIS — M76892 Other specified enthesopathies of left lower limb, excluding foot: Secondary | ICD-10-CM | POA: Diagnosis not present

## 2022-05-11 DIAGNOSIS — G8929 Other chronic pain: Secondary | ICD-10-CM

## 2022-05-11 DIAGNOSIS — M25552 Pain in left hip: Secondary | ICD-10-CM

## 2022-05-11 DIAGNOSIS — R252 Cramp and spasm: Secondary | ICD-10-CM

## 2022-05-11 DIAGNOSIS — R262 Difficulty in walking, not elsewhere classified: Secondary | ICD-10-CM | POA: Diagnosis not present

## 2022-05-11 NOTE — Therapy (Signed)
OUTPATIENT PHYSICAL THERAPY LOWER EXTREMITY TREATMENT NOTE   Patient Name: Jordan [name] MRN: [id number] DOB:Mar 25, 1960, 62 y.o., female Today's Date: 05/11/2022  END OF SESSION:  PT End of Session - 05/11/22 1112     Visit Number 3    Date for PT Re-Evaluation 06/27/22    Authorization Type [employer]    Progress Note Due on Visit 10    PT Start Time 1100    PT Stop Time 1140    PT Time Calculation (min) 40 min    Activity Tolerance Patient tolerated treatment well    Behavior During Therapy WFL for tasks assessed/performed             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    Lump or mass in breast    Meningitis, unspecified(322.9)    Oral thrush    Other specified glaucoma    Perforation of tympanic membrane, unspecified    Radial styloid tenosynovitis    Seizures ([place])    Past Surgical History:  Procedure Laterality Date   ADENOIDECTOMY     ANKLE SURGERY  1992   right ankle - scar tissue   DILATION AND CURETTAGE OF UTERUS  2006   History of Menometrorrhagia   INNER EAR SURGERY  1966 to 1986   6 surgeries on right ear   LAPAROSCOPY  1993   normal   REDUCTION MAMMAPLASTY Bilateral 2018   uterine ablation  2011   WRIST SURGERY  2001   for de-quervaine - bilateral wrists - tendonitis   Patient Active Problem List   Diagnosis Date Noted   Vitamin D deficiency 04/19/2022   IGT (impaired glucose  tolerance) 03/24/2021   Iron deficiency anemia 03/24/2021   Hyperlipidemia 03/24/2021   Low back pain 10/19/2020   Severe recurrent major depression without psychotic features ([place]) 09/27/2015    Class: Chronic   Fibromyalgia 04/03/2013   Back pain, lumbosacral 04/03/2013   Degeneration of cervical intervertebral disc 04/03/2013   Dyspepsia and disorder of function of stomach 03/16/2012   Routine health maintenance 01/03/2012   Mild cognitive impairment with memory loss 01/03/2012   Cough 07/03/2011   Bilateral dry eyes 05/13/2011   Back pain, thoracic 06/15/2010   MULTIPLE SCLEROSIS, PROGRESSIVE/RELAPSING 06/02/2009   Incontinence without sensory awareness 06/01/2009   NASAL POLYP 05/14/2009   Panic disorder without agoraphobia 04/25/2007   Carpal tunnel syndrome 04/25/2007   NECK PAIN, CHRONIC 02/19/2007   HAY FEVER 02/18/2007   ANEMIA-NOS 12/05/2006   Depression, recurrent ([place]) 12/05/2006   GLAUCOMA NEC 12/05/2006   TYMPANIC MEMBRANE PERFORATION 12/05/2006   FIBROCYSTIC BREAST DISEASE 12/05/2006   DE QUERVAIN'S TENOSYNOVITIS 12/05/2006   Headache 12/05/2006   BULIMIA, HX OF 12/05/2006   ANOREXIA, HX OF 12/05/2006    PCP: Isaac Bliss, Rayford Halsted, MD  REFERRING PROVIDER: Concepcion Elk  REFERRING DIAG: [id number] (ICD-10-CM) - Other specified enthesopathies of left lower limb, excluding foot  THERAPY DIAG:  Pain in left hip  Muscle weakness (generalized)  Difficulty in walking, not elsewhere classified  Cramp and spasm  Chronic left-sided low back pain with left-sided sciatica  Rationale for Evaluation and Treatment: Rehabilitation  ONSET DATE: 04/12/2022  SUBJECTIVE:   SUBJECTIVE STATEMENT: Patient arrives without a.d.   She states she saw her MD and he was very pleased with her progress.  She states she has been doing some yard work and bending fwd so she was a little sore from that.    PERTINENT HISTORY: OPERATIVE REPORT:  The patient was  positioned prone and bone prominences were padded. The surgical site was prepped and draped in a sterile fashion. Ioband drapes were used to seal the perineum from the surgical site. Timeout was performed. Landmarks were identified. A 73mm incision was made 2 cm distal to the ischial tuberosity just over the hamstring tendon. The endoscope was inserted and pump turned on to 48mmHg. An accessory portal was made using an outside-in technique under direct visualization. This portal was made in the gluteal crease 4cm lateral the viewing portal. A shaver and RF device were used to expose the ischial tunnel, removing the ischial bursa. The sciatic nerve was identified and protected.  Next we identified the lesser trochanter. We placed another portal using an outside in technique to retract and protect the sciatic nerve. We used an RF device to carefully expose the lesser trochanter through the quadratus. We were careful to avoid any damage to the femoral circumflex vessels proximally and the perforating vessels distally. We then used a bur to remove the lesser trochanter. The resulted in an adequate ischiofemoral distance. Hemostasis was obtained and we did not experience any significant bleeding throughout the case. The sciatic nerve was intact without injury. We then removed the endoscope from the space and closed the incisions with nylon sutures.  Post op instructions:  You should use crutches or a walker as needed only. Do not hold the leg off the ground. Walk with a normal gait using the crutches or walker to take the weight off of the operative leg. Extremity elevation for the first 72 hours is also encouraged to minimize the swelling. You can stop using the crutches when you are confident and stable to walk.  17. Please minimize active hip flexion for 2 weeks - you should use your arms or other leg to lift your operative leg.  PAIN:  Are you having pain?  1/10 currently, and 1/10 at worst in past 24 hours    PRECAUTIONS: Post op instructions:  You should use crutches or a walker as needed only. Do not hold the leg off the ground. Walk with a normal gait using the crutches or walker to take the weight off of the operative leg. Extremity elevation for the first 72 hours is also encouraged to minimize the swelling. You can stop using the crutches when you are confident and stable to walk.  17. Please minimize active hip flexion for 2 weeks - you should use your arms or other leg to lift your operative leg.  (05/08/22)  WEIGHT BEARING RESTRICTIONS: Yes WBAT  FALLS:  Has patient fallen in last 6 months? No  LIVING ENVIRONMENT: Lives with: lives alone Lives in: House/apartment Stairs: Yes: External: 2 steps; grab bar on left going up Has following equipment at home: Gilford Rile - 2 wheeled  OCCUPATION: disabled  PLOF: Independent, Independent with basic ADLs, Independent with household mobility without device, Independent with community mobility without device, Independent with homemaking with ambulation, Independent with gait, and Independent with transfers  PATIENT GOALS: to be able to get back to being active, and get my life back  NEXT MD VISIT: 05/05/22  OBJECTIVE:    PATIENT SURVEYS:  FOTO 44, predicted 8  COGNITION: Overall cognitive status: Within functional limits for tasks assessed     SENSATION: WFL   MUSCLE LENGTH: Hamstrings: held due to recent sx  POSTURE: No Significant postural limitations  PALPATION: Held due to healing  LOWER EXTREMITY ROM:  WFL  LOWER EXTREMITY MMT:  Deferred due to recent sx on left,  right LE generally 4/5   FUNCTIONAL TESTS:  5 times sit to stand: 13.26 sec Timed up and go (TUG): 29.47 sec  GAIT: Distance walked:  30 feet Assistive device utilized: Environmental consultant - 2 wheeled Level of assistance: Modified independence Comments: antalgic, limited WB   TODAY'S TREATMENT:                                                                                                                               DATE: 05/04/22 Nustep (instructed to use arms and legs today) x 5 min  Supine SLR 2 x 10 0# left Side lying hip abduction 2 x 10 0# left Prone hip extension 2 x 10 0# left Side lying hip adduction 2 x 10 0# left Supine clam with blue loop x 20 Side lying clam with blue loop x 20 both Hook lying isometric hip adduction x 20 with pink ball Hook lying pelvic tilt x 20 Prone hamstring curl 2 x 10 0# left Superman x 10 prone Addaday to left quad, hip flexors and IT band x 5 min Prone quad stretch with strap x 10   DATE: 05/04/22 Nustep (instructed to use arms only and legs just ride) x 8 min  Seated hip abduction with yellow band x 20 both Seated LAQ x 20 both Supine hip abduction x 20 with towel under heel (left) Hook lying pelvic tilt x 20 Side lying hip abduction x 20 left Side lying clam x 20 left Quadruped rocking x 20  DATE: 05/02/22 Initiated HEP and initial eval completed    PATIENT EDUCATION:  Education details:  Initiated HEP Person educated: Patient Education method: Consulting civil engineer, Media planner, Verbal cues, and Handouts Education comprehension: verbalized understanding, returned demonstration, and verbal cues required  HOME EXERCISE PROGRAM: Access Code: [email or web address] URL: [email or web address] Date: 05/04/2022 Prepared by: Candyce Churn  Exercises - Quadruped Rocking Backward  - 1 x daily - 7 x weekly - 3 sets - 10 reps - Sidelying Hip Abduction  - 1 x daily - 7 x weekly - 3 sets - 10 reps - Supine Pelvic Tilt  - 1 x daily - 7 x weekly - 3 sets - 10 reps - Supine Hip Abduction AROM  - 1 x  daily - 7 x weekly - 3 sets - 10 reps - Clamshell  - 1 x daily - 7 x weekly - 3 sets - 10 reps  ASSESSMENT:  CLINICAL IMPRESSION: Pamlea continues to show excellent progress.  She was doing some yard work the past few days where she was bending fwd a lot.  We discussed how this could irritate her surgical  area if she was not bending her knees.  Suggested she be cautious with how much of this she is doing or be sure she is bending at the knees to reach something low.  She is well  motivated and compliant.  She would benefit from continuing skilled PT for post op rehab.     OBJECTIVE IMPAIRMENTS: Abnormal gait, decreased activity tolerance, difficulty walking, decreased ROM, decreased strength, increased fascial restrictions, impaired perceived functional ability, increased muscle spasms, impaired flexibility, and pain.   ACTIVITY LIMITATIONS: carrying, lifting, bending, sitting, standing, squatting, sleeping, stairs, transfers, bed mobility, bathing, toileting, dressing, hygiene/grooming, and caring for others  PARTICIPATION LIMITATIONS: meal prep, cleaning, laundry, driving, shopping, community activity, occupation, yard work, and church  PERSONAL FACTORS: Fitness, Past/current experiences, Time since onset of injury/illness/exacerbation, and 1-2 comorbidities: anxiety, depression and fibromyalgia  are also affecting patient's functional outcome.   REHAB POTENTIAL: Good  CLINICAL DECISION MAKING: Evolving/moderate complexity  EVALUATION COMPLEXITY: Moderate   GOALS: Goals reviewed with patient? Yes  SHORT TERM GOALS: Target date: 05/30/2022   Pain report to be no greater than 4/10  Baseline: Goal status: MET  2.  Patient will be independent with initial HEP  Baseline:  Goal status: MET   LONG TERM GOALS: Target date: 06/27/2022   Patient to report pain no greater than 2/10  Baseline:  Goal status: INITIAL  2.  Patient to be independent with advanced HEP  Baseline:  Goal status: INITIAL  3.  Gait to normalize to heel to toe progression Baseline:  Goal status: INITIAL  4.  Patient to be able to stand or walk for at least 15 min without leg pain  Baseline:  Goal status: INITIAL  5.  Patient to be able to sleep through the night  Baseline:  Goal status: INITIAL  6.   Patient to be able to resume all ADL's and IADL's independently Baseline:  Goal status: INITIAL   PLAN:  PT FREQUENCY: 1-2x/week  PT DURATION: 8 weeks  PLANNED INTERVENTIONS: Therapeutic exercises, Therapeutic activity, Neuromuscular re-education, Balance training, Gait training, Patient/Family education, Self Care, Joint mobilization, Stair training, DME instructions, Aquatic Therapy, Dry Needling, Electrical stimulation, Cryotherapy, Moist heat, scar mobilization, Taping, Ionotophoresis 4mg /ml Dexamethasone, Manual therapy, and Re-evaluation  PLAN FOR NEXT SESSION: Progress and update HEP, follow protocol,  quad rehab,  NO SLR OR ACTIVE HIP FLEXION FOR 2 WEEKS POST OP (05/08/22), WBAT with appropriate abductor strength.     Anderson Malta B. Braiden Rodman, PT 05/11/22 11:48 AM  [facility] [address] [place], [place] [address] Phone # [phone] Fax [phone]

## 2022-05-12 ENCOUNTER — Ambulatory Visit: Payer: Medicare Other | Admitting: Rehabilitative and Restorative Service Providers"

## 2022-05-12 DIAGNOSIS — H353131 Nonexudative age-related macular degeneration, bilateral, early dry stage: Secondary | ICD-10-CM | POA: Diagnosis not present

## 2022-05-12 DIAGNOSIS — H2513 Age-related nuclear cataract, bilateral: Secondary | ICD-10-CM | POA: Diagnosis not present

## 2022-05-12 DIAGNOSIS — H02054 Trichiasis without entropian left upper eyelid: Secondary | ICD-10-CM | POA: Diagnosis not present

## 2022-05-12 DIAGNOSIS — H04123 Dry eye syndrome of bilateral lacrimal glands: Secondary | ICD-10-CM | POA: Diagnosis not present

## 2022-05-12 DIAGNOSIS — H40033 Anatomical narrow angle, bilateral: Secondary | ICD-10-CM | POA: Diagnosis not present

## 2022-05-15 ENCOUNTER — Encounter: Payer: Self-pay | Admitting: Orthopaedic Surgery

## 2022-05-16 ENCOUNTER — Telehealth: Payer: Self-pay | Admitting: Orthopaedic Surgery

## 2022-05-16 ENCOUNTER — Encounter: Payer: Medicare Other | Admitting: Physical Therapy

## 2022-05-16 ENCOUNTER — Ambulatory Visit: Payer: Medicare Other

## 2022-05-16 DIAGNOSIS — M6281 Muscle weakness (generalized): Secondary | ICD-10-CM | POA: Diagnosis not present

## 2022-05-16 DIAGNOSIS — G8929 Other chronic pain: Secondary | ICD-10-CM

## 2022-05-16 DIAGNOSIS — M25552 Pain in left hip: Secondary | ICD-10-CM | POA: Diagnosis not present

## 2022-05-16 DIAGNOSIS — R262 Difficulty in walking, not elsewhere classified: Secondary | ICD-10-CM | POA: Diagnosis not present

## 2022-05-16 DIAGNOSIS — R252 Cramp and spasm: Secondary | ICD-10-CM | POA: Diagnosis not present

## 2022-05-16 DIAGNOSIS — M76892 Other specified enthesopathies of left lower limb, excluding foot: Secondary | ICD-10-CM | POA: Diagnosis not present

## 2022-05-16 NOTE — Telephone Encounter (Signed)
Patient states she wants to speak to Wendy May..[phone]

## 2022-05-16 NOTE — Therapy (Signed)
OUTPATIENT PHYSICAL THERAPY LOWER EXTREMITY TREATMENT NOTE   Patient Name: Jordan [name] MRN: [id number] DOB:[birth year], 62 y.o., female Today's Date: 05/16/2022  END OF SESSION:  PT End of Session - 05/16/22 1104     Visit Number 4    Date for PT Re-Evaluation 06/27/22    Authorization Type [employer]    Progress Note Due on Visit 10    PT Start Time 1058    PT Stop Time 1147    PT Time Calculation (min) 49 min    Activity Tolerance Patient tolerated treatment well    Behavior During Therapy WFL for tasks assessed/performed             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    Lump or mass in breast    Meningitis, unspecified(322.9)    Oral thrush    Other specified glaucoma    Perforation of tympanic membrane, unspecified    Radial styloid tenosynovitis    Seizures ([place])    Past Surgical History:  Procedure Laterality Date   ADENOIDECTOMY     ANKLE SURGERY  1992   right ankle - scar tissue   DILATION AND CURETTAGE OF UTERUS  2006   History of Menometrorrhagia   INNER EAR SURGERY  1966 to 1986   6 surgeries on right ear   LAPAROSCOPY  1993   normal   REDUCTION MAMMAPLASTY Bilateral 2018   uterine ablation  2011   WRIST SURGERY  2001   for de-quervaine - bilateral wrists - tendonitis   Patient Active Problem List   Diagnosis Date Noted   Vitamin D deficiency 04/19/2022   IGT (impaired glucose  tolerance) 03/24/2021   Iron deficiency anemia 03/24/2021   Hyperlipidemia 03/24/2021   Low back pain 10/19/2020   Severe recurrent major depression without psychotic features ([place]) 09/27/2015    Class: Chronic   Fibromyalgia 04/03/2013   Back pain, lumbosacral 04/03/2013   Degeneration of cervical intervertebral disc 04/03/2013   Dyspepsia and disorder of function of stomach 03/16/2012   Routine health maintenance 01/03/2012   Mild cognitive impairment with memory loss 01/03/2012   Cough 07/03/2011   Bilateral dry eyes 05/13/2011   Back pain, thoracic 06/15/2010   MULTIPLE SCLEROSIS, PROGRESSIVE/RELAPSING 06/02/2009   Incontinence without sensory awareness 06/01/2009   NASAL POLYP 05/14/2009   Panic disorder without agoraphobia 04/25/2007   Carpal tunnel syndrome 04/25/2007   NECK PAIN, CHRONIC 02/19/2007   HAY FEVER 02/18/2007   ANEMIA-NOS 12/05/2006   Depression, recurrent ([place]) 12/05/2006   GLAUCOMA NEC 12/05/2006   TYMPANIC MEMBRANE PERFORATION 12/05/2006   FIBROCYSTIC BREAST DISEASE 12/05/2006   DE QUERVAIN'S TENOSYNOVITIS 12/05/2006   Headache 12/05/2006   BULIMIA, HX OF 12/05/2006   ANOREXIA, HX OF 12/05/2006    PCP: Isaac Bliss, Rayford Halsted, MD  REFERRING PROVIDER: Concepcion Elk  REFERRING DIAG: [id number] (ICD-10-CM) - Other specified enthesopathies of left lower limb, excluding foot  THERAPY DIAG:  Pain in left hip  Muscle weakness (generalized)  Difficulty in walking, not elsewhere classified  Cramp and spasm  Chronic left-sided low back pain with left-sided sciatica  Rationale for Evaluation and Treatment: Rehabilitation  ONSET DATE: 04/12/2022  SUBJECTIVE:   SUBJECTIVE STATEMENT: Patient reports she has experienced some increased soreness.  "I'm walking 3 times per day and I was able to get in my shower and give my dog a bath. (Explains that she sat on shower bench and had hand held shower to do this) "I have fibromyalgia so my muscles  stay sore all the time anyway"   PERTINENT HISTORY: OPERATIVE REPORT:  The patient was positioned prone and bone prominences were padded. The surgical site was prepped and draped in a sterile fashion. Ioband drapes were used to seal the perineum from the surgical site. Timeout was performed. Landmarks were identified. A 56mm incision was made 2 cm distal to the ischial tuberosity just over the hamstring tendon. The endoscope was inserted and pump turned on to 44mmHg. An accessory portal was made using an outside-in technique under direct visualization. This portal was made in the gluteal crease 4cm lateral the viewing portal. A shaver and RF device were used to expose the ischial tunnel, removing the ischial bursa. The sciatic nerve was identified and protected.  Next we identified the lesser trochanter. We placed another portal using an outside in technique to retract and protect the sciatic nerve. We used an RF device to carefully expose the lesser trochanter through the quadratus. We were careful to avoid any damage to the femoral circumflex vessels proximally and the perforating vessels distally. We then used a bur to remove the lesser trochanter. The resulted in an adequate ischiofemoral distance. Hemostasis was obtained and we did not experience any significant bleeding throughout the case. The sciatic nerve was intact without injury. We then removed the endoscope from the space and closed the incisions with nylon sutures.  Post op instructions:  You should use crutches or a walker as needed only. Do not hold the leg off the ground. Walk with a normal gait using the crutches or walker to take the weight off of the operative leg. Extremity elevation for the first 72 hours is also encouraged to minimize the swelling. You can stop using the crutches when you are confident and stable to walk.  17. Please minimize active hip flexion for 2 weeks - you should use your arms or other leg to lift your  operative leg.  PAIN:  Are you having pain?  1/10 currently, and 1/10 at worst in past 24 hours   PRECAUTIONS: Post op instructions:  You should use crutches or a walker as needed only. Do not hold the leg off the ground. Walk with a normal gait using the crutches or walker to take the weight off of the operative leg. Extremity elevation for the first 72 hours is also encouraged to minimize the swelling. You can stop using the crutches when you are confident and stable to walk.  17. Please minimize active hip flexion for 2 weeks - you should use your arms or other leg to lift your operative leg.  (05/08/22)  WEIGHT BEARING RESTRICTIONS: Yes WBAT  FALLS:  Has patient fallen in last 6 months? No  LIVING ENVIRONMENT: Lives with: lives alone Lives in: House/apartment Stairs: Yes: External: 2 steps;  grab bar on left going up Has following equipment at home: Gilford Rile - 2 wheeled  OCCUPATION: disabled  PLOF: Independent, Independent with basic ADLs, Independent with household mobility without device, Independent with community mobility without device, Independent with homemaking with ambulation, Independent with gait, and Independent with transfers  PATIENT GOALS: to be able to get back to being active, and get my life back  NEXT MD VISIT: 05/05/22  OBJECTIVE:    PATIENT SURVEYS:  FOTO 44, predicted 9  COGNITION: Overall cognitive status: Within functional limits for tasks assessed     SENSATION: WFL   MUSCLE LENGTH: Hamstrings: held due to recent sx  POSTURE: No Significant postural limitations  PALPATION: Held due to healing  LOWER EXTREMITY ROM:  WFL  LOWER EXTREMITY MMT:  Deferred due to recent sx on left,  right LE generally 4/5   FUNCTIONAL TESTS:  5 times sit to stand: 13.26 sec Timed up and go (TUG): 29.47 sec  GAIT: Distance walked:  30 feet Assistive device utilized: Walker - 2 wheeled Level of assistance: Modified independence Comments: antalgic,  limited WB   TODAY'S TREATMENT:                                                                                                                              DATE: 05/16/22 Nustep x 5 min level 5 Supine SLR 2 x 10 0# left Side lying hip abduction 2 x 10 0# left Prone hip extension 2 x 10 0# left Side lying hip adduction 2 x 10 0# left Supine clam with blue loop x 20 Side lying clam with blue loop x 20 both Hook lying isometric hip adduction x 20 with pink ball Hook lying pelvic tilt x 20 Heel slides with towel under heel x 20 (left) Supine hip abduction with towel under heel x 20 (left) Ice to left hamstring and gluteal seated in chair x 10 min  DATE: 05/04/22 Nustep (instructed to use arms and legs today) x 5 min  Supine SLR 2 x 10 0# left Side lying hip abduction 2 x 10 0# left Prone hip extension 2 x 10 0# left Side lying hip adduction 2 x 10 0# left Supine clam with blue loop x 20 Side lying clam with blue loop x 20 both Hook lying isometric hip adduction x 20 with pink ball Hook lying pelvic tilt x 20 Prone hamstring curl 2 x 10 0# left Superman x 10 prone Addaday to left quad, hip flexors and IT band x 5 min Prone quad stretch with strap x 10   DATE: 05/04/22 Nustep (instructed to use arms only and legs just ride) x 8 min  Seated hip abduction with yellow band x 20 both Seated LAQ x 20 both Supine hip abduction x 20 with towel under heel (left) Hook lying pelvic tilt x 20 Side lying hip abduction x 20 left Side lying clam x 20 left Quadruped rocking x 20  DATE: 05/02/22  Initiated HEP and initial eval completed    PATIENT EDUCATION:  Education details:  Initiated HEP Person educated: Patient Education method: Consulting civil engineer, Media planner, Verbal cues, and Handouts Education comprehension: verbalized understanding, returned demonstration, and verbal cues required  HOME EXERCISE PROGRAM: Access Code: [email or web address] URL: [email or web address] Date:  05/04/2022 Prepared by: Candyce Churn  Exercises - Quadruped Rocking Backward  - 1 x daily - 7 x weekly - 3 sets - 10 reps - Sidelying Hip Abduction  - 1 x daily - 7 x weekly - 3 sets - 10 reps - Supine Pelvic Tilt  - 1 x daily - 7 x weekly - 3 sets - 10 reps - Supine Hip Abduction AROM  - 1 x daily - 7 x weekly - 3 sets - 10 reps - Clamshell  - 1 x daily - 7 x weekly - 3 sets - 10 reps  ASSESSMENT:  CLINICAL IMPRESSION: Tyffanie continues to progress well functionally.  But she is having more soreness.  She is quite active and may be irritating the surgical site.  We discussed lowering her activity level and being cautious to avoid excessive fwd bending.   She is well  motivated and compliant.  She would benefit from continuing skilled PT for post op rehab.     OBJECTIVE IMPAIRMENTS: Abnormal gait, decreased activity tolerance, difficulty walking, decreased ROM, decreased strength, increased fascial restrictions, impaired perceived functional ability, increased muscle spasms, impaired flexibility, and pain.   ACTIVITY LIMITATIONS: carrying, lifting, bending, sitting, standing, squatting, sleeping, stairs, transfers, bed mobility, bathing, toileting, dressing, hygiene/grooming, and caring for others  PARTICIPATION LIMITATIONS: meal prep, cleaning, laundry, driving, shopping, community activity, occupation, yard work, and church  PERSONAL FACTORS: Fitness, Past/current experiences, Time since onset of injury/illness/exacerbation, and 1-2 comorbidities: anxiety, depression and fibromyalgia  are also affecting patient's functional outcome.   REHAB POTENTIAL: Good  CLINICAL DECISION MAKING: Evolving/moderate complexity  EVALUATION COMPLEXITY: Moderate   GOALS: Goals reviewed with patient? Yes  SHORT TERM GOALS: Target date: 05/30/2022   Pain report to be no greater than 4/10  Baseline: Goal status: MET  2.  Patient will be independent with initial HEP  Baseline:  Goal status:  MET   LONG TERM GOALS: Target date: 06/27/2022   Patient to report pain no greater than 2/10  Baseline:  Goal status: INITIAL  2.  Patient to be independent with advanced HEP  Baseline:  Goal status: INITIAL  3.  Gait to normalize to heel to toe progression Baseline:  Goal status: INITIAL  4.  Patient to be able to stand or walk for at least 15 min without leg pain  Baseline:  Goal status: INITIAL  5.  Patient to be able to sleep through the night  Baseline:  Goal status: INITIAL  6.  Patient to be able to resume all ADL's and IADL's independently Baseline:  Goal status: INITIAL   PLAN:  PT FREQUENCY: 1-2x/week  PT DURATION: 8 weeks  PLANNED INTERVENTIONS: Therapeutic exercises, Therapeutic activity, Neuromuscular re-education, Balance training, Gait training, Patient/Family education, Self Care, Joint mobilization, Stair training, DME instructions, Aquatic Therapy, Dry Needling, Electrical stimulation, Cryotherapy, Moist heat, scar mobilization, Taping, Ionotophoresis 4mg /ml Dexamethasone, Manual therapy, and Re-evaluation  PLAN FOR NEXT SESSION: Progress and update HEP, follow protocol,  quad rehab,  NO SLR OR ACTIVE HIP FLEXION FOR 2 WEEKS POST OP (05/08/22), WBAT with appropriate abductor strength.     Anderson Malta B. Kelcey Korus, PT 05/16/22 11:45 AM  [facility] [address], [place], [place]  [place] Phone # [phone] Fax [phone]

## 2022-05-17 ENCOUNTER — Encounter: Payer: Medicare Other | Admitting: Physical Therapy

## 2022-05-18 ENCOUNTER — Ambulatory Visit: Payer: Medicare Other

## 2022-05-18 DIAGNOSIS — M6281 Muscle weakness (generalized): Secondary | ICD-10-CM | POA: Diagnosis not present

## 2022-05-18 DIAGNOSIS — M76892 Other specified enthesopathies of left lower limb, excluding foot: Secondary | ICD-10-CM | POA: Diagnosis not present

## 2022-05-18 DIAGNOSIS — R252 Cramp and spasm: Secondary | ICD-10-CM

## 2022-05-18 DIAGNOSIS — M25552 Pain in left hip: Secondary | ICD-10-CM

## 2022-05-18 DIAGNOSIS — R262 Difficulty in walking, not elsewhere classified: Secondary | ICD-10-CM | POA: Diagnosis not present

## 2022-05-18 DIAGNOSIS — G8929 Other chronic pain: Secondary | ICD-10-CM

## 2022-05-18 NOTE — Therapy (Signed)
OUTPATIENT PHYSICAL THERAPY LOWER EXTREMITY TREATMENT NOTE   Patient Name: Jordan [name] MRN: [id number] DOB:Sep 19, 1960, 62 y.o., female Today's Date: 05/18/2022  END OF SESSION:  PT End of Session - 05/18/22 1116     Visit Number 5    Date for PT Re-Evaluation 06/27/22    Authorization Type [employer]    Progress Note Due on Visit 10    PT Start Time 1100    PT Stop Time 1145    PT Time Calculation (min) 45 min    Activity Tolerance Patient tolerated treatment well    Behavior During Therapy WFL for tasks assessed/performed             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    Lump or mass in breast    Meningitis, unspecified(322.9)    Oral thrush    Other specified glaucoma    Perforation of tympanic membrane, unspecified    Radial styloid tenosynovitis    Seizures ([place])    Past Surgical History:  Procedure Laterality Date   ADENOIDECTOMY     ANKLE SURGERY  1992   right ankle - scar tissue   DILATION AND CURETTAGE OF UTERUS  2006   History of Menometrorrhagia   INNER EAR SURGERY  1966 to 1986   6 surgeries on right ear   LAPAROSCOPY  1993   normal   REDUCTION MAMMAPLASTY Bilateral 2018   uterine ablation  2011   WRIST SURGERY  2001   for de-quervaine - bilateral wrists - tendonitis   Patient Active Problem List   Diagnosis Date Noted   Vitamin D deficiency 04/19/2022   IGT (impaired glucose  tolerance) 03/24/2021   Iron deficiency anemia 03/24/2021   Hyperlipidemia 03/24/2021   Low back pain 10/19/2020   Severe recurrent major depression without psychotic features ([place]) 09/27/2015    Class: Chronic   Fibromyalgia 04/03/2013   Back pain, lumbosacral 04/03/2013   Degeneration of cervical intervertebral disc 04/03/2013   Dyspepsia and disorder of function of stomach 03/16/2012   Routine health maintenance 01/03/2012   Mild cognitive impairment with memory loss 01/03/2012   Cough 07/03/2011   Bilateral dry eyes 05/13/2011   Back pain, thoracic 06/15/2010   MULTIPLE SCLEROSIS, PROGRESSIVE/RELAPSING 06/02/2009   Incontinence without sensory awareness 06/01/2009   NASAL POLYP 05/14/2009   Panic disorder without agoraphobia 04/25/2007   Carpal tunnel syndrome 04/25/2007   NECK PAIN, CHRONIC 02/19/2007   HAY FEVER 02/18/2007   ANEMIA-NOS 12/05/2006   Depression, recurrent ([place]) 12/05/2006   GLAUCOMA NEC 12/05/2006   TYMPANIC MEMBRANE PERFORATION 12/05/2006   FIBROCYSTIC BREAST DISEASE 12/05/2006   DE QUERVAIN'S TENOSYNOVITIS 12/05/2006   Headache 12/05/2006   BULIMIA, HX OF 12/05/2006   ANOREXIA, HX OF 12/05/2006    PCP: Isaac Bliss, Rayford Halsted, MD  REFERRING PROVIDER: Concepcion Elk  REFERRING DIAG: [id number] (ICD-10-CM) - Other specified enthesopathies of left lower limb, excluding foot  THERAPY DIAG:  Pain in left hip  Muscle weakness (generalized)  Difficulty in walking, not elsewhere classified  Cramp and spasm  Chronic left-sided low back pain with left-sided sciatica  Rationale for Evaluation and Treatment: Rehabilitation  ONSET DATE: 04/12/2022  SUBJECTIVE:   SUBJECTIVE STATEMENT: Patient arrives limping and reports pain at left anterior thigh.  She states she is having increased pain and soreness but only at the left thigh.  No pain in posterior thigh.    PERTINENT HISTORY: OPERATIVE REPORT:  The patient was positioned prone and bone  prominences were padded. The surgical site was prepped and draped in a sterile fashion. Ioband drapes were used to seal the perineum from the surgical site. Timeout was performed. Landmarks were identified. A 14mm incision was made 2 cm distal to the ischial tuberosity just over the hamstring tendon. The endoscope was inserted and pump turned on to 55mmHg. An accessory portal was made using an outside-in technique under direct visualization. This portal was made in the gluteal crease 4cm lateral the viewing portal. A shaver and RF device were used to expose the ischial tunnel, removing the ischial bursa. The sciatic nerve was identified and protected.  Next we identified the lesser trochanter. We placed another portal using an outside in technique to retract and protect the sciatic nerve. We used an RF device to carefully expose the lesser trochanter through the quadratus. We were careful to avoid any damage to the femoral circumflex vessels proximally and the perforating vessels distally. We then used a bur to remove the lesser trochanter. The resulted in an adequate ischiofemoral distance. Hemostasis was obtained and we did not experience any significant bleeding throughout the case. The sciatic nerve was intact without injury. We then removed the endoscope from the space and closed the incisions with nylon sutures.  Post op instructions:  You should use crutches or a walker as needed only. Do not hold the leg off the ground. Walk with a normal gait using the crutches or walker to take the weight off of the operative leg. Extremity elevation for the first 72 hours is also encouraged to minimize the swelling. You can stop using the crutches when you are confident and stable to walk.  17. Please minimize active hip flexion for 2 weeks - you should use your arms or other leg to lift your operative leg.  PAIN:  Are you having pain?  1/10 currently, and 1/10 at worst in past 24 hours   PRECAUTIONS: Post op  instructions:  You should use crutches or a walker as needed only. Do not hold the leg off the ground. Walk with a normal gait using the crutches or walker to take the weight off of the operative leg. Extremity elevation for the first 72 hours is also encouraged to minimize the swelling. You can stop using the crutches when you are confident and stable to walk.  17. Please minimize active hip flexion for 2 weeks - you should use your arms or other leg to lift your operative leg.  (05/08/22)  WEIGHT BEARING RESTRICTIONS: Yes WBAT  FALLS:  Has patient fallen in last 6 months? No  LIVING ENVIRONMENT: Lives with: lives alone Lives in: House/apartment Stairs: Yes: External: 2 steps; grab bar on left going up Has following equipment at home: Gilford Rile - 2 wheeled  OCCUPATION: disabled  PLOF: Independent, Independent with basic  ADLs, Independent with household mobility without device, Independent with community mobility without device, Independent with homemaking with ambulation, Independent with gait, and Independent with transfers  PATIENT GOALS: to be able to get back to being active, and get my life back  NEXT MD VISIT: 05/05/22  OBJECTIVE:    PATIENT SURVEYS:  FOTO 44, predicted 60  COGNITION: Overall cognitive status: Within functional limits for tasks assessed     SENSATION: WFL   MUSCLE LENGTH: Hamstrings: held due to recent sx  POSTURE: No Significant postural limitations  PALPATION: Held due to healing  LOWER EXTREMITY ROM:  WFL  LOWER EXTREMITY MMT:  Deferred due to recent sx on left,  right LE generally 4/5   FUNCTIONAL TESTS:  5 times sit to stand: 13.26 sec Timed up and go (TUG): 29.47 sec  GAIT: Distance walked:  30 feet Assistive device utilized: Walker - 2 wheeled Level of assistance: Modified independence Comments: antalgic, limited WB   TODAY'S TREATMENT:                                                                                                                               DATE: 05/18/22 Nustep x 8 min level 3 Standing quad and hamstring stretch 3 x 30 sec IFC e-stim to left quad and low back x 15 min,   DATE: 05/16/22 Nustep x 5 min level 5 Supine SLR 2 x 10 0# left Side lying hip abduction 2 x 10 0# left Prone hip extension 2 x 10 0# left Side lying hip adduction 2 x 10 0# left Supine clam with blue loop x 20 Side lying clam with blue loop x 20 both Hook lying isometric hip adduction x 20 with pink ball Hook lying pelvic tilt x 20 Heel slides with towel under heel x 20 (left) Supine hip abduction with towel under heel x 20 (left) Ice to left hamstring and gluteal seated in chair x 10 min  DATE: 05/04/22 Nustep (instructed to use arms and legs today) x 5 min  Supine SLR 2 x 10 0# left Side lying hip abduction 2 x 10 0# left Prone hip extension 2 x 10 0# left Side lying hip adduction 2 x 10 0# left Supine clam with blue loop x 20 Side lying clam with blue loop x 20 both Hook lying isometric hip adduction x 20 with pink ball Hook lying pelvic tilt x 20 Prone hamstring curl 2 x 10 0# left Superman x 10 prone Addaday to left quad, hip flexors and IT band x 5 min Prone quad stretch with strap x 10   DATE: 05/04/22 Nustep (instructed to use arms only and legs just ride) x 8 min  Seated hip abduction with yellow band x 20 both Seated LAQ x 20 both Supine hip abduction x 20 with towel under heel (left) Hook lying pelvic tilt x 20 Side lying hip abduction x 20 left Side lying clam x 20 left Quadruped  rocking x 20  DATE: 05/02/22 Initiated HEP and initial eval completed    PATIENT EDUCATION:  Education details:  Initiated HEP Person educated: Patient Education method: Consulting civil engineer, Media planner, Verbal cues, and Handouts Education comprehension: verbalized understanding, returned demonstration, and verbal cues required  HOME EXERCISE PROGRAM: Access Code: [email or web address] URL:  [email or web address] Date: 05/04/2022 Prepared by: Candyce Churn  Exercises - Quadruped Rocking Backward  - 1 x daily - 7 x weekly - 3 sets - 10 reps - Sidelying Hip Abduction  - 1 x daily - 7 x weekly - 3 sets - 10 reps - Supine Pelvic Tilt  - 1 x daily - 7 x weekly - 3 sets - 10 reps - Supine Hip Abduction AROM  - 1 x daily - 7 x weekly - 3 sets - 10 reps - Clamshell  - 1 x daily - 7 x weekly - 3 sets - 10 reps  ASSESSMENT:  CLINICAL IMPRESSION: Nakima is having a slight setback with pain in left anterior thigh.  She feels she may be over doing it.  She reported decreased pain at end of session.  We will monitor these symptoms closely as she has hx of low back pain.   She is well  motivated and compliant.  She would benefit from continuing skilled PT for post op rehab.     OBJECTIVE IMPAIRMENTS: Abnormal gait, decreased activity tolerance, difficulty walking, decreased ROM, decreased strength, increased fascial restrictions, impaired perceived functional ability, increased muscle spasms, impaired flexibility, and pain.   ACTIVITY LIMITATIONS: carrying, lifting, bending, sitting, standing, squatting, sleeping, stairs, transfers, bed mobility, bathing, toileting, dressing, hygiene/grooming, and caring for others  PARTICIPATION LIMITATIONS: meal prep, cleaning, laundry, driving, shopping, community activity, occupation, yard work, and church  PERSONAL FACTORS: Fitness, Past/current experiences, Time since onset of injury/illness/exacerbation, and 1-2 comorbidities: anxiety, depression and fibromyalgia  are also affecting patient's functional outcome.   REHAB POTENTIAL: Good  CLINICAL DECISION MAKING: Evolving/moderate complexity  EVALUATION COMPLEXITY: Moderate   GOALS: Goals reviewed with patient? Yes  SHORT TERM GOALS: Target date: 05/30/2022   Pain report to be no greater than 4/10  Baseline: Goal status: MET  2.  Patient will be independent with initial HEP   Baseline:  Goal status: MET   LONG TERM GOALS: Target date: 06/27/2022   Patient to report pain no greater than 2/10  Baseline:  Goal status: INITIAL  2.  Patient to be independent with advanced HEP  Baseline:  Goal status: INITIAL  3.  Gait to normalize to heel to toe progression Baseline:  Goal status: INITIAL  4.  Patient to be able to stand or walk for at least 15 min without leg pain  Baseline:  Goal status: INITIAL  5.  Patient to be able to sleep through the night  Baseline:  Goal status: INITIAL  6.  Patient to be able to resume all ADL's and IADL's independently Baseline:  Goal status: INITIAL   PLAN:  PT FREQUENCY: 1-2x/week  PT DURATION: 8 weeks  PLANNED INTERVENTIONS: Therapeutic exercises, Therapeutic activity, Neuromuscular re-education, Balance training, Gait training, Patient/Family education, Self Care, Joint mobilization, Stair training, DME instructions, Aquatic Therapy, Dry Needling, Electrical stimulation, Cryotherapy, Moist heat, scar mobilization, Taping, Ionotophoresis 4mg /ml Dexamethasone, Manual therapy, and Re-evaluation  PLAN FOR NEXT SESSION: Progress and update HEP, follow protocol,  quad rehab.   Anderson Malta B. Bela Nyborg, PT 05/18/22 11:56 AM  [facility] [address] [place],  [address] Phone # [phone] Fax [phone]

## 2022-05-19 ENCOUNTER — Ambulatory Visit: Payer: Medicare Other | Admitting: Psychology

## 2022-05-22 NOTE — Telephone Encounter (Signed)
I called and LM on VM for patient to call me back.

## 2022-05-24 ENCOUNTER — Ambulatory Visit: Payer: Medicare Other | Attending: Surgery

## 2022-05-24 DIAGNOSIS — M25552 Pain in left hip: Secondary | ICD-10-CM | POA: Diagnosis not present

## 2022-05-24 DIAGNOSIS — M6281 Muscle weakness (generalized): Secondary | ICD-10-CM

## 2022-05-24 DIAGNOSIS — G8929 Other chronic pain: Secondary | ICD-10-CM

## 2022-05-24 DIAGNOSIS — R262 Difficulty in walking, not elsewhere classified: Secondary | ICD-10-CM

## 2022-05-24 DIAGNOSIS — M5442 Lumbago with sciatica, left side: Secondary | ICD-10-CM | POA: Insufficient documentation

## 2022-05-24 DIAGNOSIS — R252 Cramp and spasm: Secondary | ICD-10-CM

## 2022-05-24 NOTE — Therapy (Signed)
OUTPATIENT PHYSICAL THERAPY LOWER EXTREMITY TREATMENT NOTE   Patient Name: Jordan [name] MRN: [id number] DOB:1960-08-05, 62 y.o., female Today's Date: 05/24/2022  END OF SESSION:  PT End of Session - 05/24/22 1149     Visit Number 6    Date for PT Re-Evaluation 06/27/22    Authorization Type [employer] Medicare    Progress Note Due on Visit 10    PT Start Time 1146    PT Stop Time 1232    PT Time Calculation (min) 46 min    Activity Tolerance Patient tolerated treatment well    Behavior During Therapy WFL for tasks assessed/performed             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    Lump or mass in breast    Meningitis, unspecified(322.9)    Oral thrush    Other specified glaucoma    Perforation of tympanic membrane, unspecified    Radial styloid tenosynovitis    Seizures    Past Surgical History:  Procedure Laterality Date   ADENOIDECTOMY     ANKLE SURGERY  1992   right ankle - scar tissue   DILATION AND CURETTAGE OF UTERUS  2006   History of Menometrorrhagia   INNER EAR SURGERY  1966 to 1986   6 surgeries on right ear   LAPAROSCOPY  1993   normal   REDUCTION MAMMAPLASTY Bilateral 2018   uterine ablation  2011   WRIST SURGERY  2001   for de-quervaine - bilateral wrists - tendonitis   Patient Active Problem List   Diagnosis Date Noted   Vitamin D deficiency 04/19/2022   IGT (impaired glucose tolerance)  03/24/2021   Iron deficiency anemia 03/24/2021   Hyperlipidemia 03/24/2021   Low back pain 10/19/2020   Severe recurrent major depression without psychotic features 09/27/2015    Class: Chronic   Fibromyalgia 04/03/2013   Back pain, lumbosacral 04/03/2013   Degeneration of cervical intervertebral disc 04/03/2013   Dyspepsia and disorder of function of stomach 03/16/2012   Routine health maintenance 01/03/2012   Mild cognitive impairment with memory loss 01/03/2012   Cough 07/03/2011   Bilateral dry eyes 05/13/2011   Back pain, thoracic 06/15/2010   MULTIPLE SCLEROSIS, PROGRESSIVE/RELAPSING 06/02/2009   Incontinence without sensory awareness 06/01/2009   NASAL POLYP 05/14/2009   Panic disorder without agoraphobia 04/25/2007   Carpal tunnel syndrome 04/25/2007   NECK PAIN, CHRONIC 02/19/2007   HAY FEVER 02/18/2007   ANEMIA-NOS 12/05/2006   Depression, recurrent ([place]) 12/05/2006   GLAUCOMA NEC 12/05/2006   TYMPANIC MEMBRANE PERFORATION 12/05/2006   FIBROCYSTIC BREAST DISEASE 12/05/2006   DE QUERVAIN'S TENOSYNOVITIS 12/05/2006   Headache 12/05/2006   BULIMIA, HX OF 12/05/2006   ANOREXIA, HX OF 12/05/2006    PCP: Isaac Bliss, Rayford Halsted, MD   REFERRING PROVIDER:  Concepcion Elk  REFERRING DIAG: [phone] (ICD-10-CM) - Other specified enthesopathies of left lower limb, excluding foot  THERAPY DIAG:  Pain in left hip  Muscle weakness (generalized)  Difficulty in walking, not elsewhere classified  Cramp and spasm  Chronic left-sided low back pain with left-sided sciatica  Rationale for Evaluation and Treatment: Rehabilitation  ONSET DATE: 04/12/2022  SUBJECTIVE:   SUBJECTIVE STATEMENT: Patient states she is continuing to do well with the back of the thigh and buttock but the left anterior thigh is still bothersome.   PERTINENT HISTORY: OPERATIVE REPORT:  The patient was positioned prone and bone prominences were padded. The surgical site was prepped and  draped in a sterile fashion. Ioband drapes were used to seal the perineum from the surgical site. Timeout was performed. Landmarks were identified. A 27mm incision was made 2 cm distal to the ischial tuberosity just over the hamstring tendon. The endoscope was inserted and pump turned on to 5mmHg. An accessory portal was made using an outside-in technique under direct visualization. This portal was made in the gluteal crease 4cm lateral the viewing portal. A shaver and RF device were used to expose the ischial tunnel, removing the ischial bursa. The sciatic nerve was identified and protected.  Next we identified the lesser trochanter. We placed another portal using an outside in technique to retract and protect the sciatic nerve. We used an RF device to carefully expose the lesser trochanter through the quadratus. We were careful to avoid any damage to the femoral circumflex vessels proximally and the perforating vessels distally. We then used a bur to remove the lesser trochanter. The resulted in an adequate ischiofemoral distance. Hemostasis was obtained and we did not experience any significant bleeding throughout the case. The sciatic nerve was intact without injury. We then removed the endoscope from the space and closed the incisions with nylon sutures.  Post op instructions:  You should use crutches or a walker as needed only. Do not hold the leg off the ground. Walk with a normal gait using the crutches or walker to take the weight off of the operative leg. Extremity elevation for the first 72 hours is also encouraged to minimize the swelling. You can stop using the crutches when you are confident and stable to walk.  17. Please minimize active hip flexion for 2 weeks - you should use your arms or other leg to lift your operative leg.  PAIN:  Are you having pain?  1/10 currently, and 1/10 at worst in past 24 hours   PRECAUTIONS: Post op instructions:  You should use crutches or a walker as needed  only. Do not hold the leg off the ground. Walk with a normal gait using the crutches or walker to take the weight off of the operative leg. Extremity elevation for the first 72 hours is also encouraged to minimize the swelling. You can stop using the crutches when you are confident and stable to walk.  17. Please minimize active hip flexion for 2 weeks - you should use your arms or other leg to lift your operative leg.  (05/08/22)  WEIGHT BEARING RESTRICTIONS: Yes WBAT  FALLS:  Has patient fallen in last 6 months? No  LIVING ENVIRONMENT: Lives with: lives alone Lives in: House/apartment Stairs: Yes: External: 2 steps; grab bar on left going up Has following equipment at home: Gilford Rile - 2 wheeled  OCCUPATION: disabled  PLOF: Independent, Independent with basic ADLs, Independent with household mobility without device, Independent with community mobility  without device, Independent with homemaking with ambulation, Independent with gait, and Independent with transfers  PATIENT GOALS: to be able to get back to being active, and get my life back  NEXT MD VISIT: 05/05/22  OBJECTIVE:    PATIENT SURVEYS:  FOTO 44, predicted 69  COGNITION: Overall cognitive status: Within functional limits for tasks assessed     SENSATION: WFL   MUSCLE LENGTH: Hamstrings: held due to recent sx  POSTURE: No Significant postural limitations  PALPATION: Held due to healing  LOWER EXTREMITY ROM:  WFL  LOWER EXTREMITY MMT:  Deferred due to recent sx on left,  right LE generally 4/5   FUNCTIONAL TESTS:  5 times sit to stand: 13.26 sec Timed up and go (TUG): 29.47 sec  GAIT: Distance walked:  30 feet Assistive device utilized: Walker - 2 wheeled Level of assistance: Modified independence Comments: antalgic, limited WB   TODAY'S TREATMENT:                                                                                                                              DATE: 05/24/22 Nustep x 5  min level 5 Standing quad stretch 3 x 30 sec both Supine clam with blue loop x 20 Side lying clam with blue loop x 20 both Supine SLR 2 x 10 0# left Side lying hip abduction 2 x 10 0# left Prone hip extension 2 x 10 0# left Side lying hip adduction 2 x 10 0# left  DATE: 05/18/22 Nustep x 8 min level 3 Standing quad and hamstring stretch 3 x 30 sec IFC e-stim to left quad and low back x 15 min,   DATE: 05/16/22 Nustep x 5 min level 5 Supine SLR 2 x 10 0# left Side lying hip abduction 2 x 10 0# left Prone hip extension 2 x 10 0# left Side lying hip adduction 2 x 10 0# left Supine clam with blue loop x 20 Side lying clam with blue loop x 20 both Hook lying isometric hip adduction x 20 with pink ball Hook lying pelvic tilt x 20 Heel slides with towel under heel x 20 (left) Supine hip abduction with towel under heel x 20 (left) Ice to left hamstring and gluteal seated in chair x 10 min    PATIENT EDUCATION:  Education details:  Initiated HEP Person educated: Patient Education method: Consulting civil engineer, Media planner, Verbal cues, and Handouts Education comprehension: verbalized understanding, returned demonstration, and verbal cues required  HOME EXERCISE PROGRAM: Access Code: [email or web address] URL: [email or web address] Date: 05/04/2022 Prepared by: Candyce Churn  Exercises - Quadruped Rocking Backward  - 1 x daily - 7 x weekly - 3 sets - 10 reps - Sidelying Hip Abduction  - 1 x daily - 7 x weekly - 3 sets - 10 reps - Supine Pelvic Tilt  - 1 x daily - 7 x weekly - 3 sets - 10 reps - Supine Hip Abduction AROM  - 1 x daily - 7  x weekly - 3 sets - 10 reps - Clamshell  - 1 x daily - 7 x weekly - 3 sets - 10 reps  ASSESSMENT:  CLINICAL IMPRESSION: Dawnya had less thigh pain today.  She was able to do all exercises with minimal pain.   She would benefit from continuing skilled PT for post op rehab.     OBJECTIVE IMPAIRMENTS: Abnormal gait, decreased activity tolerance,  difficulty walking, decreased ROM, decreased strength, increased fascial restrictions, impaired perceived functional ability, increased muscle spasms, impaired flexibility, and pain.   ACTIVITY LIMITATIONS: carrying, lifting, bending, sitting, standing, squatting, sleeping, stairs, transfers, bed mobility, bathing, toileting, dressing, hygiene/grooming, and caring for others  PARTICIPATION LIMITATIONS: meal prep, cleaning, laundry, driving, shopping, community activity, occupation, yard work, and church  PERSONAL FACTORS: Fitness, Past/current experiences, Time since onset of injury/illness/exacerbation, and 1-2 comorbidities: anxiety, depression and fibromyalgia  are also affecting patient's functional outcome.   REHAB POTENTIAL: Good  CLINICAL DECISION MAKING: Evolving/moderate complexity  EVALUATION COMPLEXITY: Moderate   GOALS: Goals reviewed with patient? Yes  SHORT TERM GOALS: Target date: 05/30/2022   Pain report to be no greater than 4/10  Baseline: Goal status: MET  2.  Patient will be independent with initial HEP  Baseline:  Goal status: MET   LONG TERM GOALS: Target date: 06/27/2022   Patient to report pain no greater than 2/10  Baseline:  Goal status: INITIAL  2.  Patient to be independent with advanced HEP  Baseline:  Goal status: INITIAL  3.  Gait to normalize to heel to toe progression Baseline:  Goal status: INITIAL  4.  Patient to be able to stand or walk for at least 15 min without leg pain  Baseline:  Goal status: INITIAL  5.  Patient to be able to sleep through the night  Baseline:  Goal status: INITIAL  6.  Patient to be able to resume all ADL's and IADL's independently Baseline:  Goal status: INITIAL   PLAN:  PT FREQUENCY: 1-2x/week  PT DURATION: 8 weeks  PLANNED INTERVENTIONS: Therapeutic exercises, Therapeutic activity, Neuromuscular re-education, Balance training, Gait training, Patient/Family education, Self Care, Joint  mobilization, Stair training, DME instructions, Aquatic Therapy, Dry Needling, Electrical stimulation, Cryotherapy, Moist heat, scar mobilization, Taping, Ionotophoresis 4mg /ml Dexamethasone, Manual therapy, and Re-evaluation  PLAN FOR NEXT SESSION: Progress and update HEP, follow protocol,  quad rehab.   Anderson Malta B. Sherill Wegener, PT 05/24/22 9:39 PM   [facility] [address], [place], [place] [address] Phone # [phone] Fax [phone]

## 2022-05-25 ENCOUNTER — Ambulatory Visit: Payer: Medicare Other

## 2022-05-27 ENCOUNTER — Other Ambulatory Visit: Payer: Self-pay | Admitting: Internal Medicine

## 2022-05-27 ENCOUNTER — Other Ambulatory Visit: Payer: Self-pay | Admitting: Gastroenterology

## 2022-05-30 ENCOUNTER — Ambulatory Visit (INDEPENDENT_AMBULATORY_CARE_PROVIDER_SITE_OTHER): Payer: Medicare Other | Admitting: Psychology

## 2022-05-30 DIAGNOSIS — F331 Major depressive disorder, recurrent, moderate: Secondary | ICD-10-CM
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
## 2022-05-31 ENCOUNTER — Ambulatory Visit: Payer: Medicare Other

## 2022-05-31 DIAGNOSIS — M6281 Muscle weakness (generalized): Secondary | ICD-10-CM

## 2022-05-31 DIAGNOSIS — R262 Difficulty in walking, not elsewhere classified: Secondary | ICD-10-CM | POA: Diagnosis not present

## 2022-05-31 DIAGNOSIS — M5442 Lumbago with sciatica, left side: Secondary | ICD-10-CM | POA: Diagnosis not present

## 2022-05-31 DIAGNOSIS — M25552 Pain in left hip: Secondary | ICD-10-CM | POA: Diagnosis not present

## 2022-05-31 DIAGNOSIS — G8929 Other chronic pain: Secondary | ICD-10-CM | POA: Diagnosis not present

## 2022-05-31 DIAGNOSIS — R252 Cramp and spasm: Secondary | ICD-10-CM

## 2022-05-31 NOTE — Therapy (Signed)
OUTPATIENT PHYSICAL THERAPY LOWER EXTREMITY TREATMENT NOTE   Patient Name: Jordan [name] MRN: [id number] DOB:14-Sep-1960, 62 y.o., female Today's Date: 05/31/2022  END OF SESSION:  PT End of Session - 05/31/22 1407     Visit Number 7    Date for PT Re-Evaluation 06/27/22    Authorization Type [employer]    Progress Note Due on Visit 10    PT Start Time 1403    PT Stop Time 1445    PT Time Calculation (min) 42 min    Activity Tolerance Patient tolerated treatment well    Behavior During Therapy WFL for tasks assessed/performed             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    Lump or mass in breast    Meningitis, unspecified(322.9)    Oral thrush    Other specified glaucoma    Perforation of tympanic membrane, unspecified    Radial styloid tenosynovitis    Seizures    Past Surgical History:  Procedure Laterality Date   ADENOIDECTOMY     ANKLE SURGERY  1992   right ankle - scar tissue   DILATION AND CURETTAGE OF UTERUS  2006   History of Menometrorrhagia   INNER EAR SURGERY  1966 to 1986   6 surgeries on right ear   LAPAROSCOPY  1993   normal   REDUCTION MAMMAPLASTY Bilateral 2018   uterine ablation  2011   WRIST SURGERY  2001   for de-quervaine - bilateral wrists - tendonitis   Patient Active Problem List   Diagnosis Date Noted   Vitamin D deficiency 04/19/2022   IGT (impaired glucose tolerance)  03/24/2021   Iron deficiency anemia 03/24/2021   Hyperlipidemia 03/24/2021   Low back pain 10/19/2020   Severe recurrent major depression without psychotic features 09/27/2015    Class: Chronic   Fibromyalgia 04/03/2013   Back pain, lumbosacral 04/03/2013   Degeneration of cervical intervertebral disc 04/03/2013   Dyspepsia and disorder of function of stomach 03/16/2012   Routine health maintenance 01/03/2012   Mild cognitive impairment with memory loss 01/03/2012   Cough 07/03/2011   Bilateral dry eyes 05/13/2011   Back pain, thoracic 06/15/2010   MULTIPLE SCLEROSIS, PROGRESSIVE/RELAPSING 06/02/2009   Incontinence without sensory awareness 06/01/2009   NASAL POLYP 05/14/2009   Panic disorder without agoraphobia 04/25/2007   Carpal tunnel syndrome 04/25/2007   NECK PAIN, CHRONIC 02/19/2007   HAY FEVER 02/18/2007   ANEMIA-NOS 12/05/2006   Depression, recurrent (HCC) 12/05/2006   GLAUCOMA NEC 12/05/2006   TYMPANIC MEMBRANE PERFORATION 12/05/2006   FIBROCYSTIC BREAST DISEASE 12/05/2006   DE QUERVAIN'S TENOSYNOVITIS 12/05/2006   Headache 12/05/2006   BULIMIA, HX OF 12/05/2006   ANOREXIA, HX OF 12/05/2006    PCP: Philip Aspen, Limmie Patricia, MD   REFERRING PROVIDER:  Valeta Harms  REFERRING DIAG: [phone] (ICD-10-CM) - Other specified enthesopathies of left lower limb, excluding foot  THERAPY DIAG:  Pain in left hip  Muscle weakness (generalized)  Difficulty in walking, not elsewhere classified  Cramp and spasm  Chronic left-sided low back pain with left-sided sciatica  Rationale for Evaluation and Treatment: Rehabilitation  ONSET DATE: 04/12/2022  SUBJECTIVE:   SUBJECTIVE STATEMENT: Patient states she is continuing to do well with the back of the thigh and buttock but the labral issues in the hip are more obvious.  Patient states she is walking 1 mile per day broken up into 3 increments.    PERTINENT HISTORY: OPERATIVE REPORT:  The patient was positioned  prone and bone prominences were padded. The surgical site was prepped and draped in a sterile fashion. Ioband drapes were used to seal the perineum from the surgical site. Timeout was performed. Landmarks were identified. A 5mm incision was made 2 cm distal to the ischial tuberosity just over the hamstring tendon. The endoscope was inserted and pump turned on to . An accessory portal was made using an outside-in technique under direct visualization. This portal was made in the gluteal crease 4cm lateral the viewing portal. A shaver and RF device were used to expose the ischial tunnel, removing the ischial bursa. The sciatic nerve was identified and protected.  Next we identified the lesser trochanter. We placed another portal using an outside in technique to retract and protect the sciatic nerve. We used an RF device to carefully expose the lesser trochanter through the quadratus. We were careful to avoid any damage to the femoral circumflex vessels proximally and the perforating vessels distally. We then used a bur to remove the lesser trochanter. The resulted in an adequate ischiofemoral distance. Hemostasis was obtained and we did not experience any significant bleeding throughout the case. The sciatic nerve was intact without injury. We then removed the endoscope from the space and closed the incisions with nylon sutures.  Post op instructions:  You should use crutches or a walker as needed only. Do not hold the leg off the ground. Walk with a normal gait using the crutches or walker to take the weight off of the operative leg. Extremity elevation for the first 72 hours is also encouraged to minimize the swelling. You can stop using the crutches when you are confident and stable to walk.  17. Please minimize active hip flexion for 2 weeks - you should use your arms or other leg to lift your operative leg.  PAIN:  Are you having pain?  1/10 currently, and 1/10 at worst in past 24 hours    05/31/22  PRECAUTIONS: Post op instructions:  You should use crutches or a walker as needed only. Do not hold the leg off the ground. Walk with a normal gait using the crutches or walker to take the weight off of the operative leg. Extremity elevation for the first 72 hours is also encouraged to minimize the swelling. You can stop using the crutches when you are confident and stable to walk.  17. Please minimize active hip flexion for 2 weeks - you should use your arms or other leg to lift your operative leg.  (05/08/22)  WEIGHT BEARING RESTRICTIONS: Yes WBAT  FALLS:  Has patient fallen in last 6 months? No  LIVING ENVIRONMENT: Lives with: lives alone Lives in: House/apartment Stairs: Yes: External: 2 steps; grab bar on left going up Has following equipment at home: Dan Humphreys - 2 wheeled  OCCUPATION: disabled  PLOF: Independent, Independent with basic ADLs, Independent with household mobility without device, Independent with community mobility without device, Independent with homemaking with ambulation, Independent with gait, and Independent with transfers  PATIENT GOALS: to be able to get back to being active, and get my life back  NEXT MD VISIT: 05/05/22  OBJECTIVE:    PATIENT SURVEYS:  FOTO 44, predicted 63  COGNITION: Overall cognitive status: Within functional limits for tasks assessed     SENSATION: WFL   MUSCLE LENGTH: Hamstrings: held due to recent sx  POSTURE: No Significant postural limitations  PALPATION: Held due to healing  LOWER EXTREMITY ROM:  WFL  LOWER EXTREMITY MMT:  Deferred due to recent sx on left,  right LE generally 4/5   FUNCTIONAL TESTS:  5 times sit to stand: 13.26 sec Timed up and go (TUG): 29.47 sec  GAIT: Distance walked:  30 feet Assistive device utilized: Environmental consultantWalker - 2 wheeled Level of assistance: Modified independence Comments: antalgic, limited WB   TODAY'S TREATMENT:                                                                                                                               DATE: 05/31/22 Nustep x 5 min level 5 Standing quad stretch 3 x 30 sec both Supine clam with yellow loop x 20 Side lying clam with yellow loop x 20 both Supine SLR 2 x 10 2# left Side lying hip abduction 2 x 10 2# left Prone hip extension 2 x 10 2# left Side lying hip adduction 2 x 10 2# left Hooklying bridging x 20 Supine IT band stretch 3 x 30 sec (vc's for technique for isolating IT band or getting more of a general hip/gluteal stretch) Seated piriformis stretch 2 x 30 sec each side Standing lateral band walks with blue loop approx 10 feet 3 laps  DATE: 05/24/22 Nustep x 5 min level 5 Standing quad stretch 3 x 30 sec both Supine clam with blue loop x 20 Side lying clam with blue loop x 20 both Supine SLR 2 x 10 0# left Side lying hip abduction 2 x 10 0# left Prone hip extension 2 x 10 0# left Side lying hip adduction 2 x 10 0# left  DATE: 05/18/22 Nustep x 8 min level 3 Standing quad and hamstring stretch 3 x 30 sec IFC e-stim to left quad and low back x 15 min,    PATIENT EDUCATION:  Education details:  Initiated HEP Person educated: Patient Education method: Programmer, multimediaxplanation, Facilities managerDemonstration, Verbal cues, and Handouts Education comprehension: verbalized understanding, returned demonstration, and verbal cues required  HOME EXERCISE PROGRAM: Access Code: [email or web address] URL: [email or web address] Date: 05/04/2022 Prepared by: Mikey KirschnerJennifer Avianna Moynahan  Exercises - Quadruped Rocking Backward  - 1 x daily - 7 x weekly - 3 sets - 10 reps - Sidelying Hip Abduction  - 1 x daily - 7 x weekly - 3 sets - 10 reps - Supine Pelvic Tilt  -  1 x daily - 7 x weekly - 3 sets - 10 reps - Supine Hip Abduction AROM  - 1 x daily - 7 x weekly - 3 sets - 10 reps - Clamshell  - 1 x daily - 7 x weekly - 3 sets - 10 reps  ASSESSMENT:  CLINICAL IMPRESSION: Russchelle is progressing appropriately.  She has little to no pain in the area of  her surgery.  She is having some issues in the front of the hip in the groin area which she associates with her torn labrum.  However, this does not seem to limit her during her exercises.  She is beginning to resume some of her prior level of function and increasing her walking distance.  She would benefit from continuing skilled PT for post op rehab.     OBJECTIVE IMPAIRMENTS: Abnormal gait, decreased activity tolerance, difficulty walking, decreased ROM, decreased strength, increased fascial restrictions, impaired perceived functional ability, increased muscle spasms, impaired flexibility, and pain.   ACTIVITY LIMITATIONS: carrying, lifting, bending, sitting, standing, squatting, sleeping, stairs, transfers, bed mobility, bathing, toileting, dressing, hygiene/grooming, and caring for others  PARTICIPATION LIMITATIONS: meal prep, cleaning, laundry, driving, shopping, community activity, occupation, yard work, and church  PERSONAL FACTORS: Fitness, Past/current experiences, Time since onset of injury/illness/exacerbation, and 1-2 comorbidities: anxiety, depression and fibromyalgia  are also affecting patient's functional outcome.   REHAB POTENTIAL: Good  CLINICAL DECISION MAKING: Evolving/moderate complexity  EVALUATION COMPLEXITY: Moderate   GOALS: Goals reviewed with patient? Yes  SHORT TERM GOALS: Target date: 05/30/2022   Pain report to be no greater than 4/10  Baseline: Goal status: MET  2.  Patient will be independent with initial HEP  Baseline:  Goal status: MET   LONG TERM GOALS: Target date: 06/27/2022   Patient to report pain no greater than 2/10  Baseline:  Goal status: INITIAL  2.  Patient to be independent with advanced HEP  Baseline:  Goal status: INITIAL  3.  Gait to normalize to heel to toe progression Baseline:  Goal status: INITIAL  4.  Patient to be able to stand or walk for at least 15 min without leg pain  Baseline:  Goal status: INITIAL  5.   Patient to be able to sleep through the night  Baseline:  Goal status: INITIAL  6.  Patient to be able to resume all ADL's and IADL's independently Baseline:  Goal status: INITIAL   PLAN:  PT FREQUENCY: 1-2x/week  PT DURATION: 8 weeks  PLANNED INTERVENTIONS: Therapeutic exercises, Therapeutic activity, Neuromuscular re-education, Balance training, Gait training, Patient/Family education, Self Care, Joint mobilization, Stair training, DME instructions, Aquatic Therapy, Dry Needling, Electrical stimulation, Cryotherapy, Moist heat, scar mobilization, Taping, Ionotophoresis 4mg /ml Dexamethasone, Manual therapy, and Re-evaluation  PLAN FOR NEXT SESSION: Progress and update HEP, follow protocol,  quad rehab.   Victorino Dike B. Gehrig Patras, PT 05/31/22 2:48 PM  [facility] [address], [address] [place], [place] [address] Phone # [phone] Fax [phone]

## 2022-06-01 ENCOUNTER — Ambulatory Visit: Payer: Medicare Other

## 2022-06-08 ENCOUNTER — Ambulatory Visit: Payer: Medicare Other

## 2022-06-08 DIAGNOSIS — R262 Difficulty in walking, not elsewhere classified: Secondary | ICD-10-CM

## 2022-06-08 DIAGNOSIS — G8929 Other chronic pain: Secondary | ICD-10-CM | POA: Diagnosis not present

## 2022-06-08 DIAGNOSIS — M25552 Pain in left hip: Secondary | ICD-10-CM

## 2022-06-08 DIAGNOSIS — M6281 Muscle weakness (generalized): Secondary | ICD-10-CM | POA: Diagnosis not present

## 2022-06-08 DIAGNOSIS — R252 Cramp and spasm: Secondary | ICD-10-CM

## 2022-06-08 DIAGNOSIS — M5442 Lumbago with sciatica, left side: Secondary | ICD-10-CM | POA: Diagnosis not present

## 2022-06-08 NOTE — Therapy (Signed)
OUTPATIENT PHYSICAL THERAPY LOWER EXTREMITY TREATMENT NOTE   Patient Name: Jordan [name] MRN: [id number] DOB:08-30-1960, 62 y.o., female Today's Date: 06/08/2022  END OF SESSION:  PT End of Session - 06/08/22 1307     Visit Number 8    Date for PT Re-Evaluation 06/27/22    Authorization Type [employer]    Progress Note Due on Visit 10    PT Start Time 1235    PT Stop Time 1315    PT Time Calculation (min) 40 min    Activity Tolerance Patient tolerated treatment well    Behavior During Therapy WFL for tasks assessed/performed             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    Lump or mass in breast    Meningitis, unspecified(322.9)    Oral thrush    Other specified glaucoma    Perforation of tympanic membrane, unspecified    Radial styloid tenosynovitis    Seizures    Past Surgical History:  Procedure Laterality Date   ADENOIDECTOMY     ANKLE SURGERY  1992   right ankle - scar tissue   DILATION AND CURETTAGE OF UTERUS  2006   History of Menometrorrhagia   INNER EAR SURGERY  1966 to 1986   6 surgeries on right ear   LAPAROSCOPY  1993   normal   REDUCTION MAMMAPLASTY Bilateral 2018   uterine ablation  2011   WRIST SURGERY  2001   for de-quervaine - bilateral wrists - tendonitis   Patient Active Problem List   Diagnosis Date Noted   Vitamin D deficiency 04/19/2022   IGT (impaired glucose tolerance)  03/24/2021   Iron deficiency anemia 03/24/2021   Hyperlipidemia 03/24/2021   Low back pain 10/19/2020   Severe recurrent major depression without psychotic features 09/27/2015    Class: Chronic   Fibromyalgia 04/03/2013   Back pain, lumbosacral 04/03/2013   Degeneration of cervical intervertebral disc 04/03/2013   Dyspepsia and disorder of function of stomach 03/16/2012   Routine health maintenance 01/03/2012   Mild cognitive impairment with memory loss 01/03/2012   Cough 07/03/2011   Bilateral dry eyes 05/13/2011   Back pain, thoracic 06/15/2010   MULTIPLE SCLEROSIS, PROGRESSIVE/RELAPSING 06/02/2009   Incontinence without sensory awareness 06/01/2009   NASAL POLYP 05/14/2009   Panic disorder without agoraphobia 04/25/2007   Carpal tunnel syndrome 04/25/2007   NECK PAIN, CHRONIC 02/19/2007   HAY FEVER 02/18/2007   ANEMIA-NOS 12/05/2006   Depression, recurrent (HCC) 12/05/2006   GLAUCOMA NEC 12/05/2006   TYMPANIC MEMBRANE PERFORATION 12/05/2006   FIBROCYSTIC BREAST DISEASE 12/05/2006   DE QUERVAIN'S TENOSYNOVITIS 12/05/2006   Headache 12/05/2006   BULIMIA, HX OF 12/05/2006   ANOREXIA, HX OF 12/05/2006    PCP: Philip Aspen, Limmie Patricia, MD   REFERRING PROVIDER:  Valeta Harms  REFERRING DIAG: [phone] (ICD-10-CM) - Other specified enthesopathies of left lower limb, excluding foot  THERAPY DIAG:  Pain in left hip  Muscle weakness (generalized)  Difficulty in walking, not elsewhere classified  Cramp and spasm  Chronic left-sided low back pain with left-sided sciatica  Rationale for Evaluation and Treatment: Rehabilitation  ONSET DATE: 04/12/2022  SUBJECTIVE:   SUBJECTIVE STATEMENT: Patient states she is having low back pain and groin pain that is not terrible but fairly constant.  She states she has contacted her surgeon about this and he is going to take a further look at the labral issue on the left.  Otherwise, the original pain in the buttock is nearly  gone and she is able to walk more and be outside and enjoying herself again.    PERTINENT HISTORY: OPERATIVE REPORT:  The patient was positioned prone and bone prominences were padded. The surgical site was prepped and draped in a sterile fashion. Ioband drapes were used to seal the perineum from the surgical site. Timeout was performed. Landmarks were identified. A 5mm incision was made 2 cm distal to the ischial tuberosity just over the hamstring tendon. The endoscope was inserted and pump turned on to . An accessory portal was made using an outside-in technique under direct visualization. This portal was made in the gluteal crease 4cm lateral the viewing portal. A shaver and RF device were used to expose the ischial tunnel, removing the ischial bursa. The sciatic nerve was identified and protected.  Next we identified the lesser trochanter. We placed another portal using an outside in technique to retract and protect the sciatic nerve. We used an RF device to carefully expose the lesser trochanter through the quadratus. We were careful to avoid any damage to the femoral circumflex vessels proximally and the perforating vessels distally. We then used a bur to remove the lesser trochanter. The resulted in an adequate ischiofemoral distance. Hemostasis was obtained and we did not experience any significant bleeding throughout the case. The sciatic nerve was intact without injury. We then removed the endoscope from the space and closed the incisions with nylon sutures.  Post op instructions:  You should use crutches or a walker as needed only. Do not hold the leg off the ground. Walk with a normal gait using the crutches or walker to take the weight off of the operative leg. Extremity elevation for the first 72 hours is also encouraged to minimize the swelling. You can stop using the crutches when you are confident and stable to walk.  17. Please minimize active hip flexion for 2 weeks - you should  use your arms or other leg to lift your operative leg.  PAIN:  Are you having pain?  1/10 currently, and 1/10 at worst in past 24 hours   05/31/22  PRECAUTIONS: Post op instructions:  You should use crutches or a walker as needed only. Do not hold the leg off the ground. Walk with a normal gait using the crutches or walker to take the weight off of the operative leg. Extremity elevation for the first 72 hours is also encouraged to minimize the swelling. You can stop using the crutches when you are confident and stable to walk.  17. Please minimize active hip flexion for 2 weeks - you should use your arms or other leg to lift your operative leg.  (05/08/22)  WEIGHT BEARING RESTRICTIONS: Yes WBAT  FALLS:  Has patient fallen in last 6 months? No  LIVING  ENVIRONMENT: Lives with: lives alone Lives in: House/apartment Stairs: Yes: External: 2 steps; grab bar on left going up Has following equipment at home: Walker - 2 wheeled  OCCUPATION: disabled  PLOF: Independent, Independent with basic ADLs, Independent with household mobility without device, Independent with community mobility without device, Independent with homemaking with ambulation, Independent with gait, and Independent with transfers  PATIENT GOALS: to be able to get back to being active, and get my life back  NEXT MD VISIT: 05/05/22  OBJECTIVE:    PATIENT SURVEYS:  FOTO 44, predicted 63  COGNITION: Overall cognitive status: Within functional limits for tasks assessed     SENSATION: WFL   MUSCLE LENGTH: Hamstrings: held due to recent sx  POSTURE: No Significant postural limitations  PALPATION: Held due to healing  LOWER EXTREMITY ROM:  WFL  LOWER EXTREMITY MMT:  Deferred due to recent sx on left,  right LE generally 4/5   FUNCTIONAL TESTS:  5 times sit to stand: 13.26 sec Timed up and go (TUG): 29.47 sec  GAIT: Distance walked:  30 feet Assistive device utilized: Environmental consultant - 2 wheeled Level of  assistance: Modified independence Comments: antalgic, limited WB   TODAY'S TREATMENT:                                                                                                                              DATE: 06/08/22 Recumbent bike x 5 min level 5 Prone quad stretch 3 x 30 sec both Seated clam with green loop x 20 Seated piriformis stretch 2 x 30 sec each side Standing lateral band walks with green loop approx 10 feet 3 laps Prone McKenzie progression (2 min prone lying, 2 min prone on elbows and x10 press ups)  DATE: 05/31/22 Nustep x 5 min level 5 Standing quad stretch 3 x 30 sec both Supine clam with yellow loop x 20 Side lying clam with yellow loop x 20 both Supine SLR 2 x 10 2# left Side lying hip abduction 2 x 10 2# left Prone hip extension 2 x 10 2# left Side lying hip adduction 2 x 10 2# left Hooklying bridging x 20 Supine IT band stretch 3 x 30 sec (vc's for technique for isolating IT band or getting more of a general hip/gluteal stretch) Seated piriformis stretch 2 x 30 sec each side Standing lateral band walks with blue loop approx 10 feet 3 laps  DATE: 05/24/22 Nustep x 5 min level 5 Standing quad stretch 3 x 30 sec both Supine clam with blue loop x 20 Side lying clam with blue loop x 20 both Supine SLR 2 x 10 0# left Side lying hip abduction 2 x 10 0# left Prone hip extension 2 x 10 0# left Side lying hip adduction 2 x 10 0# left   PATIENT EDUCATION:  Education details:  Initiated HEP Person educated: Patient Education method: Programmer, multimedia, Facilities manager, Verbal cues, and Handouts Education comprehension: verbalized understanding, returned demonstration, and verbal  cues required  HOME EXERCISE PROGRAM: Access Code: [email or web address] URL: [email or web address] Date: 05/04/2022 Prepared by: Mikey Kirschner  Exercises - Quadruped Rocking Backward  - 1 x daily - 7 x weekly - 3 sets - 10 reps - Sidelying Hip Abduction  - 1 x daily - 7 x weekly - 3  sets - 10 reps - Supine Pelvic Tilt  - 1 x daily - 7 x weekly - 3 sets - 10 reps - Supine Hip Abduction AROM  - 1 x daily - 7 x weekly - 3 sets - 10 reps - Clamshell  - 1 x daily - 7 x weekly - 3 sets - 10 reps  ASSESSMENT:  CLINICAL IMPRESSION: Jordan [name] is progressing appropriately.  She has little to no pain in the area of her surgery.  She is talkative and which impedes our ability to add a lot each visit but she is doing really well.  She continues to  have some issues in the front of the hip in the groin area which she will be seeing her surgeon for soon.   She continues to increase her walking distance.  She would benefit from continuing skilled PT for post op rehab.     OBJECTIVE IMPAIRMENTS: Abnormal gait, decreased activity tolerance, difficulty walking, decreased ROM, decreased strength, increased fascial restrictions, impaired perceived functional ability, increased muscle spasms, impaired flexibility, and pain.   ACTIVITY LIMITATIONS: carrying, lifting, bending, sitting, standing, squatting, sleeping, stairs, transfers, bed mobility, bathing, toileting, dressing, hygiene/grooming, and caring for others  PARTICIPATION LIMITATIONS: meal prep, cleaning, laundry, driving, shopping, community activity, occupation, yard work, and church  PERSONAL FACTORS: Fitness, Past/current experiences, Time since onset of injury/illness/exacerbation, and 1-2 comorbidities: anxiety, depression and fibromyalgia  are also affecting patient's functional outcome.   REHAB POTENTIAL: Good  CLINICAL DECISION MAKING: Evolving/moderate complexity  EVALUATION COMPLEXITY: Moderate   GOALS: Goals reviewed with patient? Yes  SHORT TERM GOALS: Target date: 05/30/2022   Pain report to be no greater than 4/10  Baseline: Goal status: MET  2.  Patient will be independent with initial HEP  Baseline:  Goal status: MET   LONG TERM GOALS: Target date: 06/27/2022   Patient to report pain no greater than 2/10   Baseline:  Goal status: INITIAL  2.  Patient to be independent with advanced HEP  Baseline:  Goal status: INITIAL  3.  Gait to normalize to heel to toe progression Baseline:  Goal status: INITIAL  4.  Patient to be able to stand or walk for at least 15 min without leg pain  Baseline:  Goal status: INITIAL  5.  Patient to be able to sleep through the night  Baseline:  Goal status: INITIAL  6.  Patient to be able to resume all ADL's and IADL's independently Baseline:  Goal status: INITIAL   PLAN:  PT FREQUENCY: 1-2x/week  PT DURATION: 8 weeks  PLANNED INTERVENTIONS: Therapeutic exercises, Therapeutic activity, Neuromuscular re-education, Balance training, Gait training, Patient/Family education, Self Care, Joint mobilization, Stair training, DME instructions, Aquatic Therapy, Dry Needling, Electrical stimulation, Cryotherapy, Moist heat, scar mobilization, Taping, Ionotophoresis /ml Dexamethasone, Manual therapy, and Re-evaluation  PLAN FOR NEXT SESSION:  Follow protocol,  quad rehab, hip stability training.   Victorino Dike B. Nataly Pacifico, PT 06/08/22 3:15 PM  [facility] [address] [place], [place] [address] Phone # [phone] Fax [phone]

## 2022-06-14 ENCOUNTER — Ambulatory Visit: Payer: Medicare Other | Admitting: Internal Medicine

## 2022-06-14 ENCOUNTER — Ambulatory Visit: Payer: Medicare Other

## 2022-06-14 ENCOUNTER — Encounter: Payer: Self-pay | Admitting: Internal Medicine

## 2022-06-14 ENCOUNTER — Other Ambulatory Visit: Payer: Medicare Other

## 2022-06-14 LAB — VITAMIN D 25 HYDROXY (VIT D DEFICIENCY, FRACTURES): VITD: 44.56 ng/mL (ref 30.00–100.00)

## 2022-06-15 ENCOUNTER — Ambulatory Visit (INDEPENDENT_AMBULATORY_CARE_PROVIDER_SITE_OTHER): Payer: Self-pay | Admitting: Behavioral Health

## 2022-06-15 ENCOUNTER — Encounter: Payer: Self-pay | Admitting: Internal Medicine

## 2022-06-15 DIAGNOSIS — Z0389 Encounter for observation for other suspected diseases and conditions ruled out: Secondary | ICD-10-CM

## 2022-06-15 NOTE — Progress Notes (Signed)
Patient did not show for scheduled appointment. Did not provide 24 hour notice as required. Additional fees to be assessed.

## 2022-06-15 NOTE — Telephone Encounter (Signed)
Pt just had labs done on 06/14/22.  Pt called very upset, stating she did not have all of the labs she requested.  Pt stated MD does not listen and she is tired of it.   Pt also added that if MD is not going to do what she said, she can go ahead and find another doctor.

## 2022-06-16 ENCOUNTER — Other Ambulatory Visit: Payer: Self-pay | Admitting: Internal Medicine

## 2022-06-16 DIAGNOSIS — E559 Vitamin D deficiency, unspecified: Secondary | ICD-10-CM

## 2022-06-19 ENCOUNTER — Ambulatory Visit (INDEPENDENT_AMBULATORY_CARE_PROVIDER_SITE_OTHER): Payer: Medicare Other | Admitting: Psychology

## 2022-06-19 ENCOUNTER — Encounter: Payer: Self-pay | Admitting: Behavioral Health

## 2022-06-19 ENCOUNTER — Ambulatory Visit: Payer: Medicare Other | Admitting: Behavioral Health

## 2022-06-19 DIAGNOSIS — F331 Major depressive disorder, recurrent, moderate: Secondary | ICD-10-CM | POA: Diagnosis not present

## 2022-06-19 DIAGNOSIS — F411 Generalized anxiety disorder: Secondary | ICD-10-CM | POA: Diagnosis not present

## 2022-06-19 DIAGNOSIS — R454 Irritability and anger: Secondary | ICD-10-CM | POA: Diagnosis not present

## 2022-06-19 MED ORDER — BUSPIRONE HCL 30 MG PO TABS: 30.0000 mg | ORAL_TABLET | Freq: Two times a day (BID) | ORAL | 3 refills | Status: DC

## 2022-06-19 MED ORDER — LAMOTRIGINE 25 MG PO TABS
ORAL_TABLET | ORAL | 1 refills | Status: DC
Start: 2022-06-19 — End: 2022-08-02

## 2022-06-19 NOTE — Progress Notes (Signed)
[facility] Counselor/Therapist Progress Note  Patient ID: Jordan [name], MRN: [id number],    Date: 06/19/2022  Time Spent: 4:00pm-4:50pm   50 minutes   Treatment Type: Individual Therapy  Reported Symptoms: sadness, stress  Mental Status Exam: Appearance:  Casual     Behavior: Appropriate  Motor: Normal  Speech/Language:  Normal Rate  Affect: Appropriate  Mood: normal  Thought process: normal  Thought content:   WNL  Sensory/Perceptual disturbances:   WNL  Orientation: oriented to person, place, time/date, and situation  Attention: Good  Concentration: Good  Memory: WNL  Fund of knowledge:  Good  Insight:   Good  Judgment:  Good  Impulse Control: Good   Risk Assessment: Danger to Self:  No Self-injurious Behavior: No Danger to Others: No Duty to Warn:no Physical Aggression / Violence:No  Access to Firearms a concern: No  Gang Involvement:No   Subjective: Pt present for face-to-face individual therapy via video.  Pt consents to telehealth video session due to COVID 19 pandemic. Location of pt: home Location of therapist: home office.   Pt talked about seeing her sister at a funeral and her sister rebuffed her which was upsetting to pt.    Pt's godfather died which is a big loss for her.  Helped pt process her feelings and grief.   Pt had to end a friendship bc of issues with this friend.   Pt had done a lot for this friend who never said thank you and was using people.   Pt talked about her health.  She is still in a lot of pain and has to have 2 more surgeries. Pt had an appointment with her PCP and did not get to see her and was upset and had words with her PCP who then removed pt from the practice.  Pt is now having trouble accessing care.  Worked on self care strategies.   Provided supportive therapy.    Interventions: Cognitive Behavioral Therapy and Insight-Oriented  Diagnosis:  F33.1  Plan of Care: Recommend ongoing therapy.   Pt participated in setting treatment goals.  She wants to improve coping skills and self esteem.   Plan to meet monthly.    Treatment Plan (Treatment Plan Target Date:  05/30/2023) Client Abilities/Strengths  Pt is bright, engaging, and motivated for therapy.   Client Treatment Preferences  Individual therapy.  Client Statement of Needs  Improve coping skills.  Symptoms  Depressed or irritable mood. Low self-esteem. Unresolved grief issues.   Problems Addressed  Unipolar Depression Goals 1. Alleviate depressive symptoms and return to previous level of effective functioning. 2. Appropriately grieve the loss in order to normalize mood and to return to previously adaptive level of functioning. Objective Learn and implement behavioral strategies to overcome depression. Target Date: 2023-05-30 Frequency: Monthly  Progress: 50 Modality: individual  Related Interventions Engage the client in "behavioral activation," increasing his/her activity level and contact with sources of reward, while identifying processes that inhibit activation.  Use behavioral techniques such as instruction, rehearsal, role-playing, role reversal, as needed, to facilitate activity in the client's daily life; reinforce success. Assist the client in developing skills that increase the likelihood of deriving pleasure from behavioral activation (e.g., assertiveness skills, developing an exercise plan, less internal/more external focus, increased social involvement); reinforce success. Objective Identify important people in life, past and present, and describe the quality, good and poor, of those relationships. Target Date: 2023-05-30 Frequency: Monthly  Progress: 50 Modality: individual  Related Interventions Conduct Interpersonal Therapy beginning  with the assessment of the client's "interpersonal inventory" of important past and present relationships; develop a case formulation linking depression to grief,  interpersonal role disputes, role transitions, and/or interpersonal deficits). Objective Learn and implement problem-solving and decision-making skills. Target Date: 2023-05-30 Frequency: Monthly  Progress: 50 Modality: individual  Related Interventions Conduct Problem-Solving Therapy using techniques such as psychoeducation, modeling, and role-playing to teach client problem-solving skills (i.e., defining a problem specifically, generating possible solutions, evaluating the pros and cons of each solution, selecting and implementing a plan of action, evaluating the efficacy of the plan, accepting or revising the plan); role-play application of the problem-solving skill to a real life issue. Encourage in the client the development of a positive problem orientation in which problems and solving them are viewed as a natural part of life and not something to be feared, despaired, or avoided. 3. Develop healthy interpersonal relationships that lead to the alleviation and help prevent the relapse of depression. 4. Develop healthy thinking patterns and beliefs about self, others, and the world that lead to the alleviation and help prevent the relapse of depression. 5. Recognize, accept, and cope with feelings of depression. Diagnosis F33.1  Conditions For Discharge Achievement of treatment goals and objectives   Salomon Fick, LCSW

## 2022-06-19 NOTE — Progress Notes (Signed)
[facility]  Patient ID: Jordan [name],  MRN: [id number]  PCP: No primary care provider on file.  Date of Evaluation: 06/19/2022 Time spent:30 minutes  Chief Complaint:  Chief Complaint   Anxiety; Depression; Patient Education; Medication Problem; Follow-up; Medication Refill; Family Problem; Irritability     HISTORY/CURRENT STATUS: HPI  62 year old patient presents to this office face to face visit for  follow up and medication management. She reports feeling irritable and angry often. She is requesting medication adjustment to help. Completed her back surgery and pain has improved. Says her depression today is 3/10 and anxiety 2/10. She is sleeping 7-8 hours per night. She denies any current or prior mania.No psychosis, no auditory or visual hallucinations. Strong family hx of dementia. No SI or HI.   Prior psychiatric medication trials:   Abilify Latuda Risperdone Amitriptyline Trintellix Lexapro Prozac Zoloft Paxil Wellbutrin Remeron Effexor Cymbalta Rexulti     Individual Medical History/ Review of Systems: Changes? :No   Allergies: Prednisone, Pregabalin, and Remeron [mirtazapine]  Current Medications:  Current Outpatient Medications:    lamoTRIgine (LAMICTAL) 25 MG tablet, Take 1 tablet by mouth for 14 days, then take two tablets (50 mg total) daily., Disp: 60 tablet, Rfl: 1   ALPRAZolam (XANAX) 0.5 MG tablet, Take by mouth., Disp: , Rfl:    busPIRone (BUSPAR) 30 MG tablet, Take 1 tablet (30 mg total) by mouth 2 (two) times daily., Disp: 60 tablet, Rfl: 3   dicyclomine (BENTYL) 10 MG capsule, TAKE ONE CAPSULE BY MOUTH EVERY 6 HOURS AS NEEDED FOR ABDOMINAL PAIN, Disp: 30 capsule, Rfl: 1   DULoxetine (CYMBALTA) 60 MG capsule, Take 1 capsule (60 mg total) by mouth 2 (two) times daily., Disp: 270 capsule, Rfl: 2   esomeprazole (NEXIUM) 40 MG capsule, TAKE 1 CAPSULE BY MOUTH TWICE  DAILY BEFORE MEALS, Disp: 180 capsule, Rfl: 3    fluticasone (FLONASE) 50 MCG/ACT nasal spray, SPRAY 1 SPRAY IN EACH NOSTRIL ONCE DAILY AS NEEDED FOR ALLERGIES OR RHINITIS, Disp: 16 mL, Rfl: 2   ondansetron (ZOFRAN-ODT) 8 MG disintegrating tablet, Take 1 tablet (8 mg total) by mouth every 8 (eight) hours as needed for nausea or vomiting., Disp: 20 tablet, Rfl: 0   sucralfate (CARAFATE) 1 g tablet, Take 1 tablet (1 g total) by mouth every 6 (six) hours as needed. Slowly dissolve 1 tablet in 1 Tablespoon of distilled water prior to taking, Disp: 60 tablet, Rfl: 1   tiZANidine (ZANAFLEX) 4 MG tablet, Take 4 mg by mouth 3 (three) times daily., Disp: , Rfl:    traZODone (DESYREL) 50 MG tablet, TAKE 2 TABLETS BY MOUTH AT  BEDTIME, Disp: 90 tablet, Rfl: 0   Vitamin D, Ergocalciferol, (DRISDOL) 1.25 MG (50000 UNIT) CAPS capsule, TAKE 1 CAPSULE BY MOUTH EVERY 7  DAYS FOR 12 DOSES, Disp: 12 capsule, Rfl: 0 Medication Side Effects: none  Family Medical/ Social History: Changes? No  MENTAL HEALTH EXAM:  There were no vitals taken for this visit.There is no height or weight on file to calculate BMI.  General Appearance: Casual, Neat, and Well Groomed  Eye Contact:  Good  Speech:  Clear and Coherent  Volume:  Normal  Mood:  Anxious, Depressed, and Dysphoric  Affect:  Appropriate  Thought Process:  Coherent  Orientation:  Full (Time, Place, and Person)  Thought Content: Logical   Suicidal Thoughts:  No  Homicidal Thoughts:  No  Memory:  WNL  Judgement:  Good  Insight:  Good  Psychomotor  Activity:  Normal  Concentration:  Concentration: Good  Recall:  Good  Fund of Knowledge: Good  Language: Good  Assets:  Desire for Improvement  ADL's:  Intact  Cognition: WNL  Prognosis:  Good    DIAGNOSES:    ICD-10-CM   1. Irritability  R45.4 lamoTRIgine (LAMICTAL) 25 MG tablet    2. Major depressive disorder, recurrent episode, moderate (HCC)  F33.1 busPIRone (BUSPAR) 30 MG tablet    3. Generalized anxiety disorder  F41.1 busPIRone (BUSPAR) 30 MG  tablet      Receiving Psychotherapy: No    RECOMMENDATIONS:   Greater than 50% of 30 face to face time with patient was spent on counseling and coordination of care. We discussed her current stability. Pain has improved but we discussed her continue problems with anger and irritability.  We agreed to: Will start Lamictal  25 mg for 14 days, then 50 mg daily Will continue Cymbalta 60 mg twice daily To continue Trazodone 50 mg at bedtime as reported by pt Continue Buspar to 30 mg twice daily, at least 7-8 hours between doses. Continue Xanax 0.5 mg twice daily for severe anxiety only.  To report worsening symptoms or side effects promptly Will follow up in 4 weeks to reassess Provided emergency contact information Discussed potential metabolic side effects associated with atypical antipsychotics, as well as potential risk for movement side effects. Advised pt to contact office if movement side effects occur.   Reviewed PDMP   Joan Flores, NP

## 2022-06-20 ENCOUNTER — Ambulatory Visit: Payer: Medicare Other

## 2022-06-20 DIAGNOSIS — R262 Difficulty in walking, not elsewhere classified: Secondary | ICD-10-CM

## 2022-06-20 DIAGNOSIS — G8929 Other chronic pain: Secondary | ICD-10-CM

## 2022-06-20 DIAGNOSIS — M6281 Muscle weakness (generalized): Secondary | ICD-10-CM | POA: Diagnosis not present

## 2022-06-20 DIAGNOSIS — M25552 Pain in left hip: Secondary | ICD-10-CM | POA: Diagnosis not present

## 2022-06-20 DIAGNOSIS — R252 Cramp and spasm: Secondary | ICD-10-CM

## 2022-06-20 DIAGNOSIS — M5442 Lumbago with sciatica, left side: Secondary | ICD-10-CM | POA: Diagnosis not present

## 2022-06-20 NOTE — Therapy (Signed)
OUTPATIENT PHYSICAL THERAPY LOWER EXTREMITY TREATMENT NOTE   Patient Name: Jordan [name] MRN: [id number] DOB:08-Nov-1960, 62 y.o., female Today's Date: 06/20/2022  END OF SESSION:  PT End of Session - 06/20/22 1234     Visit Number 9    Date for PT Re-Evaluation 06/27/22    Authorization Type [employer]    Progress Note Due on Visit 10    PT Start Time 1231    PT Stop Time 1310    PT Time Calculation (min) 39 min    Activity Tolerance Patient tolerated treatment well    Behavior During Therapy WFL for tasks assessed/performed             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    Lump or mass in breast    Meningitis, unspecified(322.9)    Oral thrush    Other specified glaucoma    Perforation of tympanic membrane, unspecified    Radial styloid tenosynovitis    Seizures (HCC)    Past Surgical History:  Procedure Laterality Date   ADENOIDECTOMY     ANKLE SURGERY  1992   right ankle - scar tissue   DILATION AND CURETTAGE OF UTERUS  2006   History of Menometrorrhagia   INNER EAR SURGERY  1966 to 1986   6 surgeries on right ear   LAPAROSCOPY  1993   normal   REDUCTION MAMMAPLASTY Bilateral 2018   uterine ablation  2011   WRIST SURGERY  2001   for de-quervaine - bilateral wrists - tendonitis   Patient Active Problem List   Diagnosis Date Noted   Vitamin D deficiency 04/19/2022   IGT (impaired glucose  tolerance) 03/24/2021   Iron deficiency anemia 03/24/2021   Hyperlipidemia 03/24/2021   Low back pain 10/19/2020   Severe recurrent major depression without psychotic features (HCC) 09/27/2015    Class: Chronic   Fibromyalgia 04/03/2013   Back pain, lumbosacral 04/03/2013   Degeneration of cervical intervertebral disc 04/03/2013   Dyspepsia and disorder of function of stomach 03/16/2012   Routine health maintenance 01/03/2012   Mild cognitive impairment with memory loss 01/03/2012   Cough 07/03/2011   Bilateral dry eyes 05/13/2011   Back pain, thoracic 06/15/2010   MULTIPLE SCLEROSIS, PROGRESSIVE/RELAPSING 06/02/2009   Incontinence without sensory awareness 06/01/2009   NASAL POLYP 05/14/2009   Panic disorder without agoraphobia 04/25/2007   Carpal tunnel syndrome 04/25/2007   NECK PAIN, CHRONIC 02/19/2007   HAY FEVER 02/18/2007   ANEMIA-NOS 12/05/2006   Depression, recurrent (HCC) 12/05/2006   GLAUCOMA NEC 12/05/2006   TYMPANIC MEMBRANE PERFORATION 12/05/2006   FIBROCYSTIC BREAST DISEASE 12/05/2006   DE QUERVAIN'S TENOSYNOVITIS 12/05/2006   Headache 12/05/2006   BULIMIA, HX OF 12/05/2006   ANOREXIA, HX OF 12/05/2006    PCP: Philip Aspen, Limmie Patricia, MD  REFERRING  PROVIDER: Valeta Harms  REFERRING DIAG: [phone] (ICD-10-CM) - Other specified enthesopathies of left lower limb, excluding foot  THERAPY DIAG:  Pain in left hip  Difficulty in walking, not elsewhere classified  Muscle weakness (generalized)  Cramp and spasm  Chronic left-sided low back pain with left-sided sciatica  Rationale for Evaluation and Treatment: Rehabilitation  ONSET DATE: 04/12/2022  SUBJECTIVE:   SUBJECTIVE STATEMENT: Patient states she is having hip pain in opposite side now and still feels like her right labrum is "hot".  She feels that the buttock pain that she initially came for is a lot better.  She states it is still noticeable but her other issues have overshadowed that  pain.  Those issues include right buttock pain similar to what happened on the left and right hip pain in the area of the labrum/groin.     PERTINENT HISTORY: OPERATIVE REPORT:  The patient was positioned prone and bone prominences were padded. The surgical site was prepped and draped in a sterile fashion. Ioband drapes were used to seal the perineum from the surgical site. Timeout was performed. Landmarks were identified. A 5mm incision was made 2 cm distal to the ischial tuberosity just over the hamstring tendon. The endoscope was inserted and pump turned on to . An accessory portal was made using an outside-in technique under direct visualization. This portal was made in the gluteal crease 4cm lateral the viewing portal. A shaver and RF device were used to expose the ischial tunnel, removing the ischial bursa. The sciatic nerve was identified and protected.  Next we identified the lesser trochanter. We placed another portal using an outside in technique to retract and protect the sciatic nerve. We used an RF device to carefully expose the lesser trochanter through the quadratus. We were careful to avoid any damage to the femoral circumflex vessels proximally and the perforating vessels distally. We then used a bur to remove the lesser trochanter. The resulted in an adequate ischiofemoral distance. Hemostasis was obtained and we did not experience any significant bleeding throughout the case. The sciatic nerve was intact without injury. We then removed the endoscope from the space and closed the incisions with nylon sutures.  Post op instructions:  You should use crutches or a walker as needed only. Do not hold the leg off the ground. Walk with a normal gait using the crutches or walker to take the weight off of the operative leg. Extremity elevation for the first 72 hours is also encouraged to minimize the swelling. You can stop using the crutches when you are confident and stable to walk.  17.  Please minimize active hip flexion for 2 weeks - you should use your arms or other leg to lift your operative leg.  PAIN:  Are you having pain?  Yes, 2/10 on surgery side and 9/10 on opposite hip and buttock  06/20/22  PRECAUTIONS: Post op instructions:  You should use crutches or a walker as needed only. Do not hold the leg off the ground. Walk with a normal gait using the crutches or walker to take the weight off of the operative leg. Extremity elevation for the first 72 hours is also encouraged to minimize the swelling. You can stop using the crutches when you are confident and stable to walk.  17. Please minimize active hip flexion for 2 weeks - you should use your arms or other leg to lift your operative leg.  (05/08/22)  WEIGHT BEARING RESTRICTIONS: Yes WBAT  FALLS:  Has  patient fallen in last 6 months? No  LIVING ENVIRONMENT: Lives with: lives alone Lives in: House/apartment Stairs: Yes: External: 2 steps; grab bar on left going up Has following equipment at home: Walker - 2 wheeled  OCCUPATION: disabled  PLOF: Independent, Independent with basic ADLs, Independent with household mobility without device, Independent with community mobility without device, Independent with homemaking with ambulation, Independent with gait, and Independent with transfers  PATIENT GOALS: to be able to get back to being active, and get my life back  NEXT MD VISIT: 05/05/22  OBJECTIVE:    PATIENT SURVEYS:  FOTO 44, predicted 63  COGNITION: Overall cognitive status: Within functional limits for tasks assessed     SENSATION: WFL   MUSCLE LENGTH: Hamstrings: held due to recent sx  POSTURE: No Significant postural limitations  PALPATION: Held due to healing  LOWER EXTREMITY ROM:  WFL  LOWER EXTREMITY MMT:  Deferred due to recent sx on left,  right LE generally 4/5   FUNCTIONAL TESTS:  5 times sit to stand: 13.26 sec Timed up and go (TUG): 29.47 sec  GAIT: Distance walked:  30  feet Assistive device utilized: Walker - 2 wheeled Level of assistance: Modified independence Comments: antalgic, limited WB   TODAY'S TREATMENT:                                                                                                                              DATE: 06/20/22 Nustep x 8 min level 5 Supine butterfly stretch 3 x 30 sec Supine combined hip IR and ER x 10  Supine piriformis stretch 3 x 30 sec 4 way hip 2 x 10 both hips with 2 lbs  DATE: 06/08/22 Recumbent bike x 5 min level 5 Prone quad stretch 3 x 30 sec both Seated clam with green loop x 20 Seated piriformis stretch 2 x 30 sec each side Standing lateral band walks with green loop approx 10 feet 3 laps Prone McKenzie progression (2 min prone lying, 2 min prone on elbows and x10 press ups)  DATE: 05/31/22 Nustep x 5 min level 5 Standing quad stretch 3 x 30 sec both Supine clam with yellow loop x 20 Side lying clam with yellow loop x 20 both Supine SLR 2 x 10 2# left Side lying hip abduction 2 x 10 2# left Prone hip extension 2 x 10 2# left Side lying hip adduction 2 x 10 2# left Hooklying bridging x 20 Supine IT band stretch 3 x 30 sec (vc's for technique for isolating IT band or getting more of a general hip/gluteal stretch) Seated piriformis stretch 2 x 30 sec each side Standing lateral band walks with blue loop approx 10 feet 3 laps  PATIENT EDUCATION:  Education details:  Initiated HEP Person educated: Patient Education method: Programmer, multimedia, Facilities manager, Verbal cues, and Handouts Education comprehension: verbalized understanding, returned demonstration, and verbal cues required  HOME EXERCISE PROGRAM: Access Code: [email or web address] URL: [email or web address] Date: 05/04/2022 Prepared by:  Victorino Dike Tevan Marian  Exercises - Quadruped Rocking Backward  - 1 x daily - 7 x weekly - 3 sets - 10 reps - Sidelying Hip Abduction  - 1 x daily - 7 x weekly - 3 sets - 10 reps - Supine Pelvic Tilt  - 1 x  daily - 7 x weekly - 3 sets - 10 reps - Supine Hip Abduction AROM  - 1 x daily - 7 x weekly - 3 sets - 10 reps - Clamshell  - 1 x daily - 7 x weekly - 3 sets - 10 reps  ASSESSMENT:  CLINICAL IMPRESSION: Kayle is doing well with the injury she initially came in for but is having pain in the right buttock area and left hip joint area.  She is able to do all tasks today with good form and with increased resistance.  She demonstrates antalgic gait upon entering clinic but this was improved upon exiting clinic.   She would benefit from continuing skilled PT for post op rehab but we may transition to aquatic therapy to see if she can continue with the left hip stability and strengthening she needs without exacerbating the right hip or the left labral pain.  She sees Careers adviser for follow up in about 2 weeks. We are going to go ahead and recertify for a few more weeks to allow her to attempt pool therapy.  If her pain is not resolved with this, we will refer back to MD.    OBJECTIVE IMPAIRMENTS: Abnormal gait, decreased activity tolerance, difficulty walking, decreased ROM, decreased strength, increased fascial restrictions, impaired perceived functional ability, increased muscle spasms, impaired flexibility, and pain.   ACTIVITY LIMITATIONS: carrying, lifting, bending, sitting, standing, squatting, sleeping, stairs, transfers, bed mobility, bathing, toileting, dressing, hygiene/grooming, and caring for others  PARTICIPATION LIMITATIONS: meal prep, cleaning, laundry, driving, shopping, community activity, occupation, yard work, and church  PERSONAL FACTORS: Fitness, Past/current experiences, Time since onset of injury/illness/exacerbation, and 1-2 comorbidities: anxiety, depression and fibromyalgia  are also affecting patient's functional outcome.   REHAB POTENTIAL: Good  CLINICAL DECISION MAKING: Evolving/moderate complexity  EVALUATION COMPLEXITY: Moderate   GOALS: Goals reviewed with patient?  Yes  SHORT TERM GOALS: Target date: 05/30/2022   Pain report to be no greater than 4/10  Baseline: Goal status: MET  2.  Patient will be independent with initial HEP  Baseline:  Goal status: MET   LONG TERM GOALS: Target date: 06/27/2022   Patient to report pain no greater than 2/10  Baseline:  Goal status: IN PROGRESS  2.  Patient to be independent with advanced HEP  Baseline:  Goal status: IN PROGRESS  3.  Gait to normalize to heel to toe progression Baseline:  Goal status: IN PROGRESS  4.  Patient to be able to stand or walk for at least 15 min without leg pain  Baseline:  Goal status: IN PROGRESS  5.  Patient to be able to sleep through the night  Baseline:  Goal status: IN PROGRESS  6.  Patient to be able to resume all ADL's and IADL's independently Baseline:  Goal status: IN PROGRESS   PLAN:  PT FREQUENCY: 1-2x/week  PT DURATION: 8 weeks  PLANNED INTERVENTIONS: Therapeutic exercises, Therapeutic activity, Neuromuscular re-education, Balance training, Gait training, Patient/Family education, Self Care, Joint mobilization, Stair training, DME instructions, Aquatic Therapy, Dry Needling, Electrical stimulation, Cryotherapy, Moist heat, scar mobilization, Taping, Ionotophoresis 4mg /ml Dexamethasone, Manual therapy, and Re-evaluation  PLAN FOR NEXT SESSION:  Recertify for to continue protocol, focus on quad  rehab and hip stability training.  We will transition to pool as soon as possible due to other issues with the left hip and right buttock.    Victorino Dike B. Zowie Lundahl, PT 06/20/22 5:15 PM  [facility] [address] [place], [place] [address] Phone # [phone] Fax [phone]

## 2022-06-20 NOTE — Telephone Encounter (Signed)
Called pt to discuss the below no answer VM left for patient to call office back

## 2022-06-22 ENCOUNTER — Ambulatory Visit: Payer: Medicare Other | Attending: Surgery

## 2022-06-22 DIAGNOSIS — R252 Cramp and spasm: Secondary | ICD-10-CM | POA: Diagnosis not present

## 2022-06-22 DIAGNOSIS — M6281 Muscle weakness (generalized): Secondary | ICD-10-CM | POA: Diagnosis not present

## 2022-06-22 DIAGNOSIS — R262 Difficulty in walking, not elsewhere classified: Secondary | ICD-10-CM | POA: Diagnosis not present

## 2022-06-22 DIAGNOSIS — M25552 Pain in left hip: Secondary | ICD-10-CM | POA: Diagnosis not present

## 2022-06-22 NOTE — Therapy (Signed)
OUTPATIENT PHYSICAL THERAPY LOWER EXTREMITY RECERT NOTE Progress Note Reporting Period 05/02/22 to 06/22/22  See note below for Objective Data and Assessment of Progress/Goals.       Patient Name: Jordan [name] MRN: [id number] DOB:05/08/1960, 62 y.o., female Today's Date: 06/23/2022  END OF SESSION:  PT End of Session - 06/22/22 1237     Visit Number 10    Date for PT Re-Evaluation 06/27/22    Authorization Type [employer] Medicare    PT Start Time 1230    PT Stop Time 1315    PT Time Calculation (min) 45 min    Activity Tolerance Patient tolerated treatment well    Behavior During Therapy WFL for tasks assessed/performed              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    Lump or mass in breast    Meningitis, unspecified(322.9)    Oral thrush    Other specified glaucoma    Perforation of tympanic membrane, unspecified    Radial styloid tenosynovitis    Seizures (HCC)    Past Surgical History:  Procedure Laterality Date   ADENOIDECTOMY     ANKLE SURGERY  1992   right ankle - scar tissue   DILATION AND CURETTAGE OF UTERUS  2006   History of Menometrorrhagia   INNER EAR SURGERY  1966 to 1986   6 surgeries on right ear   LAPAROSCOPY  1993   normal   REDUCTION MAMMAPLASTY Bilateral 2018   uterine ablation  2011   WRIST SURGERY  2001   for de-quervaine - bilateral wrists - tendonitis   Patient Active Problem List    Diagnosis Date Noted   Vitamin D deficiency 04/19/2022   IGT (impaired glucose tolerance) 03/24/2021   Iron deficiency anemia 03/24/2021   Hyperlipidemia 03/24/2021   Low back pain 10/19/2020   Severe recurrent major depression without psychotic features (HCC) 09/27/2015    Class: Chronic   Fibromyalgia 04/03/2013   Back pain, lumbosacral 04/03/2013   Degeneration of cervical intervertebral disc 04/03/2013   Dyspepsia and disorder of function of stomach 03/16/2012   Routine health maintenance 01/03/2012   Mild cognitive impairment with memory loss 01/03/2012   Cough 07/03/2011   Bilateral dry eyes 05/13/2011   Back pain, thoracic 06/15/2010   MULTIPLE SCLEROSIS, PROGRESSIVE/RELAPSING 06/02/2009   Incontinence without sensory awareness 06/01/2009   NASAL POLYP 05/14/2009   Panic disorder without agoraphobia 04/25/2007   Carpal tunnel syndrome 04/25/2007   NECK PAIN, CHRONIC 02/19/2007   HAY FEVER 02/18/2007   ANEMIA-NOS 12/05/2006   Depression, recurrent (HCC) 12/05/2006   GLAUCOMA NEC 12/05/2006   TYMPANIC MEMBRANE PERFORATION 12/05/2006   FIBROCYSTIC BREAST DISEASE 12/05/2006   DE QUERVAIN'S TENOSYNOVITIS 12/05/2006   Headache 12/05/2006   BULIMIA, HX OF 12/05/2006   ANOREXIA,  HX OF 12/05/2006    PCP: Philip Aspen, Limmie Patricia, MD  REFERRING PROVIDER: Valeta Harms  REFERRING DIAG: [phone] (ICD-10-CM) - Other specified enthesopathies of left lower limb, excluding foot  THERAPY DIAG:  Pain in left hip  Difficulty in walking, not elsewhere classified  Muscle weakness (generalized)  Cramp and spasm  Rationale for Evaluation and Treatment: Rehabilitation  ONSET DATE: 04/12/2022  SUBJECTIVE:   SUBJECTIVE STATEMENT: Patient states she is doing well today.  Not as much pain but is limping on start up.  She reports continued right buttock discomfort and left anterior hip pain.  She will see MD on Monday.  She feels like he may have to do surgery for the  labrum on the left and the same surgery she just had on the right.       PERTINENT HISTORY: OPERATIVE REPORT:  The patient was positioned prone and bone prominences were padded. The surgical site was prepped and draped in a sterile fashion. Ioband drapes were used to seal the perineum from the surgical site. Timeout was performed. Landmarks were identified. A 5mm incision was made 2 cm distal to the ischial tuberosity just over the hamstring tendon. The endoscope was inserted and pump turned on to . An accessory portal was made using an outside-in technique under direct visualization. This portal was made in the gluteal crease 4cm lateral the viewing portal. A shaver and RF device were used to expose the ischial tunnel, removing the ischial bursa. The sciatic nerve was identified and protected.  Next we identified the lesser trochanter. We placed another portal using an outside in technique to retract and protect the sciatic nerve. We used an RF device to carefully expose the lesser trochanter through the quadratus. We were careful to avoid any damage to the femoral circumflex vessels proximally and the perforating vessels distally. We then used a bur to remove the lesser trochanter. The resulted in an adequate ischiofemoral distance. Hemostasis was obtained and we did not experience any significant bleeding throughout the case. The sciatic nerve was intact without injury. We then removed the endoscope from the space and closed the incisions with nylon sutures.  Post op instructions:  You should use crutches or a walker as needed only. Do not hold the leg off the ground. Walk with a normal gait using the crutches or walker to take the weight off of the operative leg. Extremity elevation for the first 72 hours is also encouraged to minimize the swelling. You can stop using the crutches when you are confident and stable to walk.  17. Please minimize active hip flexion for 2 weeks - you should use your  arms or other leg to lift your operative leg.  PAIN:  Are you having pain?  Yes, 3-4/10 on surgery side and pain has subsided 3-4/10 on opposite hip and buttock  06/22/22  PRECAUTIONS:  Post op instructions:  You should use crutches or a walker as needed only. Do not hold the leg off the ground. Walk with a normal gait using the crutches or walker to take the weight off of the operative leg. Extremity elevation for the first 72 hours is also encouraged to minimize the swelling. You can stop using the crutches when you are confident and stable to walk.  17. Please minimize active hip flexion for 2 weeks - you should use your arms or other leg to lift your operative leg.  (05/08/22)  WEIGHT BEARING RESTRICTIONS: Yes WBAT  FALLS:  Has patient  fallen in last 6 months? No  LIVING ENVIRONMENT: Lives with: lives alone Lives in: House/apartment Stairs: Yes: External: 2 steps; grab bar on left going up Has following equipment at home: Walker - 2 wheeled  OCCUPATION: disabled  PLOF: Independent, Independent with basic ADLs, Independent with household mobility without device, Independent with community mobility without device, Independent with homemaking with ambulation, Independent with gait, and Independent with transfers  PATIENT GOALS: to be able to get back to being active, and get my life back  NEXT MD VISIT: 05/05/22  OBJECTIVE:    PATIENT SURVEYS:  Initial eval: FOTO 44, predicted 63  06/22/22: FOTO 54, predicted 63  COGNITION: Overall cognitive status: Within functional limits for tasks assessed     SENSATION: WFL   MUSCLE LENGTH: Hamstrings: held due to recent sx  POSTURE: No Significant postural limitations  PALPATION: Patient states she is tender to palpation at the anterior left hip and right ischial tuberosity  LOWER EXTREMITY ROM:  WFL  LOWER EXTREMITY MMT:  Initial eval: Deferred due to recent sx on left,  right LE generally 4/5  06/22/22: Left Generally  4+/5 with exception of hip flexion 4/5, right LE generally 4+/5   FUNCTIONAL TESTS:  Initial eval: 5 times sit to stand: 13.26 sec Timed up and go (TUG): 29.47 sec  06/22/22: 5 times sit to stand: 11.92 sec Timed up and go (TUG): 6.50 sec  GAIT: Distance walked:  30 feet Assistive device utilized: Environmental consultant - 2 wheeled Level of assistance: Modified independence Comments: antalgic, limited WB   TODAY'S TREATMENT:                                                                                                                              DATE: 06/22/22 Nustep x 8 min level 5 Recert/10th visit assessment Standing hamstring stretch 3 x 30 sec both Standing quad stretch 3 x 30 sec both Seated piriformis stretch 3 x 30 sec both 4 way hip 2 x 10 both hips with 2 lbs  DATE: 06/20/22 Nustep x 8 min level 5 Supine butterfly stretch 3 x 30 sec Supine combined hip IR and ER x 10  Supine piriformis stretch 3 x 30 sec 4 way hip 2 x 10 both hips with 2 lbs  DATE: 06/08/22 Recumbent bike x 5 min level 5 Prone quad stretch 3 x 30 sec both Seated clam with green loop x 20 Seated piriformis stretch 2 x 30 sec each side Standing lateral band walks with green loop approx 10 feet 3 laps Prone McKenzie progression (2 min prone lying, 2 min prone on elbows and x10 press ups)  PATIENT EDUCATION:  Education details:  Initiated HEP Person educated: Patient Education method: Programmer, multimedia, Facilities manager, Verbal cues, and Handouts Education comprehension: verbalized understanding, returned demonstration, and verbal cues required  HOME EXERCISE PROGRAM: Access Code: [email or web address] URL: [email or web address] Date: 05/04/2022 Prepared by: Mikey Kirschner  Exercises - Quadruped Rocking Backward  - 1 x daily -  7 x weekly - 3 sets - 10 reps - Sidelying Hip Abduction  - 1 x daily - 7 x weekly - 3 sets - 10 reps - Supine Pelvic Tilt  - 1 x daily - 7 x weekly - 3 sets - 10 reps - Supine Hip  Abduction AROM  - 1 x daily - 7 x weekly - 3 sets - 10 reps - Clamshell  - 1 x daily - 7 x weekly - 3 sets - 10 reps  ASSESSMENT:  CLINICAL IMPRESSION: Kaelene is progressing very well with left buttock issues.  However, she is having anterior hip pain on left and right buttock pain similar to what she felt on her left side.  She is able to do all tasks today with no significant issues other than anterior hip pain with SLR on left.  Due to her hip stability issues, we are going to recertify her and try aquatic therapy after next visit.  She sees MD on Monday   OBJECTIVE IMPAIRMENTS: Abnormal gait, decreased activity tolerance, difficulty walking, decreased ROM, decreased strength, increased fascial restrictions, impaired perceived functional ability, increased muscle spasms, impaired flexibility, and pain.   ACTIVITY LIMITATIONS: carrying, lifting, bending, sitting, standing, squatting, sleeping, stairs, transfers, bed mobility, bathing, toileting, dressing, hygiene/grooming, and caring for others  PARTICIPATION LIMITATIONS: meal prep, cleaning, laundry, driving, shopping, community activity, occupation, yard work, and church  PERSONAL FACTORS: Fitness, Past/current experiences, Time since onset of injury/illness/exacerbation, and 1-2 comorbidities: anxiety, depression and fibromyalgia  are also affecting patient's functional outcome.   REHAB POTENTIAL: Good  CLINICAL DECISION MAKING: Evolving/moderate complexity  EVALUATION COMPLEXITY: Moderate   GOALS: Goals reviewed with patient? Yes  SHORT TERM GOALS: Target date: 05/30/2022   Pain report to be no greater than 4/10  Baseline: Goal status: MET  2.  Patient will be independent with initial HEP  Baseline:  Goal status: MET   LONG TERM GOALS: Target date: 06/27/2022   Patient to report pain no greater than 2/10  Baseline:  Goal status: IN PROGRESS  2.  Patient to be independent with advanced HEP  Baseline:  Goal status: IN  PROGRESS  3.  Gait to normalize to heel to toe progression Baseline:  Goal status: IN PROGRESS  4.  Patient to be able to stand or walk for at least 15 min without leg pain  Baseline:  Goal status: IN PROGRESS  5.  Patient to be able to sleep through the night  Baseline:  Goal status: IN PROGRESS  6.  Patient to be able to resume all ADL's and IADL's independently Baseline:  Goal status: IN PROGRESS   PLAN:  PT FREQUENCY: 1-2x/week  PT DURATION: 8 weeks  PLANNED INTERVENTIONS: Therapeutic exercises, Therapeutic activity, Neuromuscular re-education, Balance training, Gait training, Patient/Family education, Self Care, Joint mobilization, Stair training, DME instructions, Aquatic Therapy, Dry Needling, Electrical stimulation, Cryotherapy, Moist heat, scar mobilization, Taping, Ionotophoresis 4mg /ml Dexamethasone, Manual therapy, and Re-evaluation  PLAN FOR NEXT SESSION:  Recertify for to continue protocol, focus on quad rehab and hip stability training.  We will transition to pool as soon as possible due to other issues with the left hip and right buttock.    Victorino Dike B. Paris [place], PT 06/23/22 6:52 AM  [facility] [address] [place], [place] [address] Phone # [phone] Fax [phone]

## 2022-06-22 NOTE — Telephone Encounter (Addendum)
Spoke to the patient and explained the reason for the dismissal.Pt was okay with the dismissal from Dr. Tish Men but was not okay with being dismissed from all [facility] offices. I reached out to Director in reference to the patients concern and she was okay with removing all other dismissals from [facility]. This has been removed.

## 2022-06-26 DIAGNOSIS — M76891 Other specified enthesopathies of right lower limb, excluding foot: Secondary | ICD-10-CM | POA: Diagnosis not present

## 2022-06-26 DIAGNOSIS — M76892 Other specified enthesopathies of left lower limb, excluding foot: Secondary | ICD-10-CM | POA: Diagnosis not present

## 2022-06-26 DIAGNOSIS — M76899 Other specified enthesopathies of unspecified lower limb, excluding foot: Secondary | ICD-10-CM | POA: Diagnosis not present

## 2022-06-26 DIAGNOSIS — M25851 Other specified joint disorders, right hip: Secondary | ICD-10-CM | POA: Diagnosis not present

## 2022-06-26 DIAGNOSIS — M25852 Other specified joint disorders, left hip: Secondary | ICD-10-CM | POA: Diagnosis not present

## 2022-06-27 ENCOUNTER — Ambulatory Visit: Payer: Medicare Other

## 2022-06-29 DIAGNOSIS — Z9889 Other specified postprocedural states: Secondary | ICD-10-CM | POA: Diagnosis not present

## 2022-06-29 DIAGNOSIS — H93291 Other abnormal auditory perceptions, right ear: Secondary | ICD-10-CM | POA: Diagnosis not present

## 2022-07-01 DIAGNOSIS — R300 Dysuria: Secondary | ICD-10-CM | POA: Diagnosis not present

## 2022-07-03 ENCOUNTER — Ambulatory Visit: Payer: Medicare Other | Admitting: Psychology

## 2022-07-03 DIAGNOSIS — F331 Major depressive disorder, recurrent, moderate: Secondary | ICD-10-CM

## 2022-07-03 NOTE — Progress Notes (Deleted)
Behavioral Health Counselor/Therapist Progress Note  Patient ID: Jordan [name], MRN: [id number],    Date: 07/03/2022  Time Spent: 12:00pm-12:50pm   50 minutes   Treatment Type: Individual Therapy  Reported Symptoms: sadness, stress  Mental Status Exam: Appearance:  Casual     Behavior: Appropriate  Motor: Normal  Speech/Language:  Normal Rate  Affect: Appropriate  Mood: normal  Thought process: normal  Thought content:   WNL  Sensory/Perceptual disturbances:   WNL  Orientation: oriented to person, place, time/date, and situation  Attention: Good  Concentration: Good  Memory: WNL  Fund of knowledge:  Good  Insight:   Good  Judgment:  Good  Impulse Control: Good   Risk Assessment: Danger to Self:  No Self-injurious Behavior: No Danger to Others: No Duty to Warn:no Physical Aggression / Violence:No  Access to Firearms a concern: No  Gang Involvement:No   Subjective: Pt present for face-to-face individual therapy via video.  Pt consents to telehealth video session due to COVID 19 pandemic. Location of pt: home Location of therapist: home office.   Pt talked about  Worked on self care strategies.   Provided supportive therapy.    Interventions: Cognitive Behavioral Therapy and Insight-Oriented  Diagnosis:  F33.1  Plan of Care: Recommend ongoing therapy.  Pt participated in setting treatment goals.  She wants to improve coping skills and self esteem.   Plan to meet monthly.    Treatment Plan (Treatment Plan Target Date:  05/30/2023) Client Abilities/Strengths  Pt is bright, engaging, and motivated for therapy.   Client Treatment Preferences  Individual therapy.  Client Statement of Needs  Improve coping skills.  Symptoms  Depressed or irritable mood. Low self-esteem. Unresolved grief issues.   Problems Addressed  Unipolar Depression Goals 1. Alleviate depressive symptoms and return to previous level of effective functioning. 2.  Appropriately grieve the loss in order to normalize mood and to return to previously adaptive level of functioning. Objective Learn and implement behavioral strategies to overcome depression. Target Date: 2023-05-30 Frequency: Monthly  Progress: 50 Modality: individual  Related Interventions Engage the client in "behavioral activation," increasing his/her activity level and contact with sources of reward, while identifying processes that inhibit activation.  Use behavioral techniques such as instruction, rehearsal, role-playing, role reversal, as needed, to facilitate activity in the client's daily life; reinforce success. Assist the client in developing skills that increase the likelihood of deriving pleasure from behavioral activation (e.g., assertiveness skills, developing an exercise plan, less internal/more external focus, increased social involvement); reinforce success. Objective Identify important people in life, past and present, and describe the quality, good and poor, of those relationships. Target Date: 2023-05-30 Frequency: Monthly  Progress: 50 Modality: individual  Related Interventions Conduct Interpersonal Therapy beginning with the assessment of the client's "interpersonal inventory" of important past and present relationships; develop a case formulation linking depression to grief, interpersonal role disputes, role transitions, and/or interpersonal deficits). Objective Learn and implement problem-solving and decision-making skills. Target Date: 2023-05-30 Frequency: Monthly  Progress: 50 Modality: individual  Related Interventions Conduct Problem-Solving Therapy using techniques such as psychoeducation, modeling, and role-playing to teach client problem-solving skills (i.e., defining a problem specifically, generating possible solutions, evaluating the pros and cons of each solution, selecting and implementing a plan of action, evaluating the efficacy of the plan, accepting or  revising the plan); role-play application of the problem-solving skill to a real life issue. Encourage in the client the development of a positive problem orientation in which problems and solving them are  viewed as a natural part of life and not something to be feared, despaired, or avoided. 3. Develop healthy interpersonal relationships that lead to the alleviation and help prevent the relapse of depression. 4. Develop healthy thinking patterns and beliefs about self, others, and the world that lead to the alleviation and help prevent the relapse of depression. 5. Recognize, accept, and cope with feelings of depression. Diagnosis F33.1  Conditions For Discharge Achievement of treatment goals and objectives   Salomon Fick, LCSW

## 2022-07-05 ENCOUNTER — Encounter (HOSPITAL_BASED_OUTPATIENT_CLINIC_OR_DEPARTMENT_OTHER): Payer: Self-pay | Admitting: Physical Therapy

## 2022-07-05 ENCOUNTER — Encounter (HOSPITAL_BASED_OUTPATIENT_CLINIC_OR_DEPARTMENT_OTHER): Payer: Medicare Other | Attending: Surgery | Admitting: Physical Therapy

## 2022-07-05 ENCOUNTER — Telehealth: Payer: Self-pay | Admitting: Family Medicine

## 2022-07-05 DIAGNOSIS — M6281 Muscle weakness (generalized): Secondary | ICD-10-CM | POA: Insufficient documentation

## 2022-07-05 DIAGNOSIS — R262 Difficulty in walking, not elsewhere classified: Secondary | ICD-10-CM | POA: Insufficient documentation

## 2022-07-05 DIAGNOSIS — R252 Cramp and spasm: Secondary | ICD-10-CM | POA: Insufficient documentation

## 2022-07-05 DIAGNOSIS — M25552 Pain in left hip: Secondary | ICD-10-CM | POA: Insufficient documentation

## 2022-07-05 NOTE — Telephone Encounter (Signed)
Patient has called back. I have scheduled her to establish care with Dr. Jon Billings.

## 2022-07-05 NOTE — Telephone Encounter (Signed)
Patient was dismissed from [facility] but not all [facility] sites.   Practice Admin, Margaretann Loveless reached out to inquire about patient establishing care with Dr. Mardelle Matte, per patient request.  Dr. Mardelle Matte is closed to taking new patients due to her schedule change/ time in the office. I have spoken with Dr. Mardelle Matte.  Dr. Mardelle Matte is not able to take patient on at this time.  I have left a vm for patient to call me back in regard.   Patient can schedule with a provider currently taking patients.

## 2022-07-05 NOTE — Therapy (Signed)
Patient did not show for Aquatic PT appointment.  Call and left voice mail regarding missed visit today,  requesting patient return phone call to confirm next upcoming appointment.  Left number for [facility] [phone].   Mayer Camel, PTA 07/05/22 9:35 AM [facility] [address] [place], [place], [address] Phone: [phone]   Fax:  [phone]

## 2022-07-11 ENCOUNTER — Other Ambulatory Visit: Payer: Self-pay | Admitting: Gastroenterology

## 2022-07-11 NOTE — Telephone Encounter (Signed)
Yes that is fine we can refill that, I would like to see her within the next 6 months for routine follow-up.  Thanks

## 2022-07-18 ENCOUNTER — Ambulatory Visit: Payer: Medicare Other | Admitting: Behavioral Health

## 2022-07-19 ENCOUNTER — Ambulatory Visit: Payer: Medicare Other | Admitting: Behavioral Health

## 2022-07-21 ENCOUNTER — Encounter (HOSPITAL_BASED_OUTPATIENT_CLINIC_OR_DEPARTMENT_OTHER): Payer: Self-pay

## 2022-07-21 ENCOUNTER — Ambulatory Visit (HOSPITAL_BASED_OUTPATIENT_CLINIC_OR_DEPARTMENT_OTHER): Payer: Medicare Other | Admitting: Physical Therapy

## 2022-07-24 ENCOUNTER — Telehealth: Payer: Self-pay

## 2022-07-24 DIAGNOSIS — D509 Iron deficiency anemia, unspecified: Secondary | ICD-10-CM

## 2022-07-24 NOTE — Telephone Encounter (Signed)
-----   Message from Cooper Render, CMA sent at 01/25/2022  9:51 AM EST ----- Regarding: labs due in June CBC, TIBC/ferritin panel due in June

## 2022-07-24 NOTE — Telephone Encounter (Signed)
MyChart message to patient to go to the lab 

## 2022-07-25 ENCOUNTER — Telehealth (HOSPITAL_BASED_OUTPATIENT_CLINIC_OR_DEPARTMENT_OTHER): Payer: Self-pay | Admitting: Physical Therapy

## 2022-07-25 ENCOUNTER — Encounter: Payer: Self-pay | Admitting: Gastroenterology

## 2022-07-25 ENCOUNTER — Ambulatory Visit (HOSPITAL_BASED_OUTPATIENT_CLINIC_OR_DEPARTMENT_OTHER): Payer: Medicare Other | Attending: Surgery | Admitting: Physical Therapy

## 2022-07-25 ENCOUNTER — Other Ambulatory Visit (INDEPENDENT_AMBULATORY_CARE_PROVIDER_SITE_OTHER): Payer: Medicare Other

## 2022-07-25 ENCOUNTER — Encounter (HOSPITAL_BASED_OUTPATIENT_CLINIC_OR_DEPARTMENT_OTHER): Payer: Self-pay

## 2022-07-25 DIAGNOSIS — H6691 Otitis media, unspecified, right ear: Secondary | ICD-10-CM | POA: Diagnosis not present

## 2022-07-25 DIAGNOSIS — D509 Iron deficiency anemia, unspecified: Secondary | ICD-10-CM

## 2022-07-25 LAB — CBC WITH DIFFERENTIAL/PLATELET
Basophils Absolute: 0.1 10*3/uL (ref 0.0–0.1)
Basophils Relative: 0.8 % (ref 0.0–3.0)
Eosinophils Absolute: 0.2 10*3/uL (ref 0.0–0.7)
Eosinophils Relative: 2.8 % (ref 0.0–5.0)
HCT: 42.9 % (ref 36.0–46.0)
Hemoglobin: 14.4 g/dL (ref 12.0–15.0)
Lymphocytes Relative: 22.4 % (ref 12.0–46.0)
Lymphs Abs: 1.5 10*3/uL (ref 0.7–4.0)
MCHC: 33.7 g/dL (ref 30.0–36.0)
MCV: 95.9 fl (ref 78.0–100.0)
Monocytes Absolute: 0.6 10*3/uL (ref 0.1–1.0)
Monocytes Relative: 9.3 % (ref 3.0–12.0)
Neutro Abs: 4.5 10*3/uL (ref 1.4–7.7)
Neutrophils Relative %: 64.7 % (ref 43.0–77.0)
Platelets: 203 10*3/uL (ref 150.0–400.0)
RBC: 4.47 Mil/uL (ref 3.87–5.11)
RDW: 14 % (ref 11.5–15.5)
WBC: 6.9 10*3/uL (ref 4.0–10.5)

## 2022-07-25 LAB — IBC + FERRITIN
Ferritin: 7.6 ng/mL — ABNORMAL LOW (ref 10.0–291.0)
Iron: 113 ug/dL (ref 42–145)
Saturation Ratios: 25.2 % (ref 20.0–50.0)
TIBC: 448 ug/dL (ref 250.0–450.0)
Transferrin: 320 mg/dL (ref 212.0–360.0)

## 2022-07-25 NOTE — Telephone Encounter (Signed)
Jordan [name] called to let us know that she has been suffering with cluster migraines of which she has never experienced before. She went to see her doctor and he suggested that she put her PT on hold until she gets all of this situated. She will reach out to [place] as soon as she gets medical clearance from her doctor to continue with PT.

## 2022-07-27 ENCOUNTER — Ambulatory Visit (HOSPITAL_BASED_OUTPATIENT_CLINIC_OR_DEPARTMENT_OTHER): Payer: Medicare Other | Admitting: Physical Therapy

## 2022-07-31 ENCOUNTER — Other Ambulatory Visit: Payer: Self-pay | Admitting: Behavioral Health

## 2022-07-31 ENCOUNTER — Ambulatory Visit (HOSPITAL_BASED_OUTPATIENT_CLINIC_OR_DEPARTMENT_OTHER): Payer: Medicare Other | Admitting: Physical Therapy

## 2022-08-01 ENCOUNTER — Ambulatory Visit: Payer: Medicare Other | Admitting: Psychology

## 2022-08-02 ENCOUNTER — Encounter: Payer: Self-pay | Admitting: Behavioral Health

## 2022-08-02 ENCOUNTER — Ambulatory Visit: Payer: Medicare Other | Admitting: Behavioral Health

## 2022-08-02 DIAGNOSIS — F331 Major depressive disorder, recurrent, moderate: Secondary | ICD-10-CM | POA: Diagnosis not present

## 2022-08-02 DIAGNOSIS — F411 Generalized anxiety disorder: Secondary | ICD-10-CM | POA: Diagnosis not present

## 2022-08-02 DIAGNOSIS — R454 Irritability and anger: Secondary | ICD-10-CM

## 2022-08-02 DIAGNOSIS — R350 Frequency of micturition: Secondary | ICD-10-CM | POA: Diagnosis not present

## 2022-08-02 DIAGNOSIS — G35 Multiple sclerosis: Secondary | ICD-10-CM

## 2022-08-02 DIAGNOSIS — F900 Attention-deficit hyperactivity disorder, predominantly inattentive type: Secondary | ICD-10-CM | POA: Diagnosis not present

## 2022-08-02 MED ORDER — LAMOTRIGINE 25 MG PO TABS: ORAL_TABLET | ORAL | 1 refills | Status: DC

## 2022-08-02 MED ORDER — ALPRAZOLAM 0.5 MG PO TABS: ORAL_TABLET | ORAL | 2 refills | Status: DC

## 2022-08-02 NOTE — Progress Notes (Signed)
[facility]  Patient ID: Jordan [name],  MRN: [id number]  PCP: No primary care provider on file.  Date of Evaluation: 08/02/2022 Time spent:30 minutes  Chief Complaint:   HISTORY/CURRENT STATUS: HPI 62 year old patient presents to this office face to face visit for  follow up and medication management. She says she is "doing ok" this visit.  Still having some problems with family and friends. Completed her back surgery and pain has improved. Says her depression today is 3/10 and anxiety 2/10. She is sleeping 7-8 hours per night. She denies any current or prior mania.No psychosis, no auditory or visual hallucinations. Strong family hx of dementia. No SI or HI.   Prior psychiatric medication trials:   Abilify Latuda Risperdone Amitriptyline Trintellix Lexapro Prozac Zoloft Paxil Wellbutrin Remeron Effexor Cymbalta Rexulti      Individual Medical History/ Review of Systems: Changes? :No   Allergies: Prednisone, Pregabalin, and Remeron [mirtazapine]  Current Medications:  Current Outpatient Medications:    ALPRAZolam (XANAX) 0.5 MG tablet, TAKE ONE TABLET BY MOUTH TWICE A DAY AS NEEDED FOR ANXIETY, Disp: 60 tablet, Rfl: 2   busPIRone (BUSPAR) 30 MG tablet, Take 1 tablet (30 mg total) by mouth 2 (two) times daily., Disp: 60 tablet, Rfl: 3   dicyclomine (BENTYL) 10 MG capsule, TAKE ONE CAPSULE BY MOUTH EVERY 6 HOURS AS NEEDED FOR ABDOMINAL PAIN, Disp: 30 capsule, Rfl: 1   DULoxetine (CYMBALTA) 60 MG capsule, Take 1 capsule (60 mg total) by mouth 2 (two) times daily., Disp: 270 capsule, Rfl: 2   esomeprazole (NEXIUM) 40 MG capsule, Take 1 capsule (40 mg total) by mouth 2 (two) times daily before a meal. PLEASE SCHEDULE A YEARLY FOLLOW UP., Disp: 180 capsule, Rfl: 3   fluticasone (FLONASE) 50 MCG/ACT nasal spray, SPRAY 1 SPRAY IN EACH NOSTRIL ONCE DAILY AS NEEDED FOR ALLERGIES OR RHINITIS, Disp: 16 mL, Rfl: 2   lamoTRIgine (LAMICTAL) 25 MG tablet,  Take one tablet by mouth twice daily, Disp: 60 tablet, Rfl: 1   ondansetron (ZOFRAN-ODT) 8 MG disintegrating tablet, Take 1 tablet (8 mg total) by mouth every 8 (eight) hours as needed for nausea or vomiting., Disp: 20 tablet, Rfl: 0   sucralfate (CARAFATE) 1 g tablet, Take 1 tablet (1 g total) by mouth every 6 (six) hours as needed. Slowly dissolve 1 tablet in 1 Tablespoon of distilled water prior to taking, Disp: 60 tablet, Rfl: 1   tiZANidine (ZANAFLEX) 4 MG tablet, Take 4 mg by mouth 3 (three) times daily., Disp: , Rfl:    traZODone (DESYREL) 50 MG tablet, TAKE 2 TABLETS BY MOUTH AT  BEDTIME, Disp: 90 tablet, Rfl: 0   Vitamin D, Ergocalciferol, (DRISDOL) 1.25 MG (50000 UNIT) CAPS capsule, TAKE 1 CAPSULE BY MOUTH EVERY 7  DAYS FOR 12 DOSES, Disp: 12 capsule, Rfl: 0 Medication Side Effects: none  Family Medical/ Social History: Changes? No  MENTAL HEALTH EXAM:  There were no vitals taken for this visit.There is no height or weight on file to calculate BMI.  General Appearance: Casual, Neat, and Well Groomed  Eye Contact:  Good  Speech:  Clear and Coherent  Volume:  Normal  Mood:  NA  Affect:  Appropriate  Thought Process:  Coherent  Orientation:  Full (Time, Place, and Person)  Thought Content: Logical   Suicidal Thoughts:  No  Homicidal Thoughts:  No  Memory:  WNL  Judgement:  Good  Insight:  Good  Psychomotor Activity:  Normal  Concentration:  Concentration:  Good  Recall:  Good  Fund of Knowledge: Good  Language: Good  Assets:  Desire for Improvement  ADL's:  Intact  Cognition: WNL  Prognosis:  Good    DIAGNOSES:    ICD-10-CM   1. Major depressive disorder, recurrent episode, moderate (HCC)  F33.1     2. Irritability  R45.4 lamoTRIgine (LAMICTAL) 25 MG tablet    ALPRAZolam (XANAX) 0.5 MG tablet    3. Generalized anxiety disorder  F41.1 ALPRAZolam (XANAX) 0.5 MG tablet    4. Attention deficit hyperactivity disorder (ADHD), predominantly inattentive type  F90.0        Receiving Psychotherapy: No    RECOMMENDATIONS:   Greater than 50% of 30 face to face time with patient was spent on counseling and coordination of care. We discussed her current stability. Pain has improved but we discussed her continue problems with anger and irritability.  We agreed to: Will continue Lamictal 50 mg daily Will continue Cymbalta 60 mg twice daily To continue Trazodone 50 mg at bedtime as reported by pt Continue Buspar to 30 mg twice daily, at least 7-8 hours between doses. Continue Xanax 0.5 mg twice daily for severe anxiety only.  To report worsening symptoms or side effects promptly Will follow up in 4 weeks to reassess Provided emergency contact information Discussed potential metabolic side effects associated with atypical antipsychotics, as well as potential risk for movement side effects. Advised pt to contact office if movement side effects occur.   Reviewed PDMP   Joan Flores, NP

## 2022-08-04 ENCOUNTER — Ambulatory Visit (HOSPITAL_BASED_OUTPATIENT_CLINIC_OR_DEPARTMENT_OTHER): Payer: Medicare Other | Admitting: Physical Therapy

## 2022-08-10 ENCOUNTER — Encounter: Payer: Self-pay | Admitting: Gastroenterology

## 2022-08-10 DIAGNOSIS — D509 Iron deficiency anemia, unspecified: Secondary | ICD-10-CM

## 2022-08-10 NOTE — Telephone Encounter (Signed)
.    Lab orders in epic.

## 2022-08-14 ENCOUNTER — Encounter: Payer: Medicare Other | Admitting: Internal Medicine

## 2022-08-17 ENCOUNTER — Other Ambulatory Visit: Payer: Self-pay | Admitting: Internal Medicine

## 2022-08-17 DIAGNOSIS — E559 Vitamin D deficiency, unspecified: Secondary | ICD-10-CM

## 2022-08-18 ENCOUNTER — Other Ambulatory Visit (INDEPENDENT_AMBULATORY_CARE_PROVIDER_SITE_OTHER): Payer: Medicare Other

## 2022-08-18 DIAGNOSIS — D509 Iron deficiency anemia, unspecified: Secondary | ICD-10-CM | POA: Diagnosis not present

## 2022-08-18 LAB — HEMOGLOBIN: Hemoglobin: 14.4 g/dL (ref 12.0–15.0)

## 2022-08-18 LAB — BUN: BUN: 18 mg/dL (ref 6–23)

## 2022-08-29 ENCOUNTER — Ambulatory Visit: Payer: Medicare Other | Admitting: Behavioral Health

## 2022-09-08 ENCOUNTER — Encounter: Payer: Medicare Other | Admitting: Internal Medicine

## 2022-09-08 ENCOUNTER — Encounter: Payer: Self-pay | Admitting: Internal Medicine

## 2022-09-11 ENCOUNTER — Ambulatory Visit: Payer: Medicare Other | Admitting: Behavioral Health

## 2022-09-11 ENCOUNTER — Encounter: Payer: Self-pay | Admitting: Behavioral Health

## 2022-09-11 DIAGNOSIS — F411 Generalized anxiety disorder: Secondary | ICD-10-CM | POA: Diagnosis not present

## 2022-09-11 DIAGNOSIS — F900 Attention-deficit hyperactivity disorder, predominantly inattentive type: Secondary | ICD-10-CM

## 2022-09-11 DIAGNOSIS — F331 Major depressive disorder, recurrent, moderate: Secondary | ICD-10-CM | POA: Diagnosis not present

## 2022-09-11 DIAGNOSIS — F99 Mental disorder, not otherwise specified: Secondary | ICD-10-CM

## 2022-09-11 DIAGNOSIS — F5105 Insomnia due to other mental disorder: Secondary | ICD-10-CM

## 2022-09-11 NOTE — Progress Notes (Signed)
[facility]  Patient ID: Jordan [name],  MRN: [id number]  PCP: Pcp, No  Date of Evaluation: 09/11/2022 Time spent:30 minutes  Chief Complaint:  Chief Complaint   Anxiety; Depression; Follow-up; Patient Education; Medication Refill     HISTORY/CURRENT STATUS: HPI 62 year old patient presents to this office face to face visit for  follow up and medication management. She says she is "doing ok" this visit.  Still having some problems with family and friends. Completed her back surgery and pain has improved. Says her depression today is 3/10 and anxiety 2/10. She is sleeping 7-8 hours per night. She denies any current or prior mania.No psychosis, no auditory or visual hallucinations. Strong family hx of dementia. No SI or HI.   Prior psychiatric medication trials:   Abilify Latuda Risperdone Amitriptyline Trintellix Lexapro Prozac Zoloft Paxil Wellbutrin Remeron Effexor Cymbalta Rexulti          Individual Medical History/ Review of Systems: Changes? :No   Allergies: Prednisone, Pregabalin, and Remeron [mirtazapine]  Current Medications:  Current Outpatient Medications:    ALPRAZolam (XANAX) 0.5 MG tablet, TAKE ONE TABLET BY MOUTH TWICE A DAY AS NEEDED FOR ANXIETY, Disp: 60 tablet, Rfl: 2   busPIRone (BUSPAR) 30 MG tablet, Take 1 tablet (30 mg total) by mouth 2 (two) times daily., Disp: 60 tablet, Rfl: 3   dicyclomine (BENTYL) 10 MG capsule, TAKE ONE CAPSULE BY MOUTH EVERY 6 HOURS AS NEEDED FOR ABDOMINAL PAIN, Disp: 30 capsule, Rfl: 1   DULoxetine (CYMBALTA) 60 MG capsule, Take 1 capsule (60 mg total) by mouth 2 (two) times daily., Disp: 270 capsule, Rfl: 2   esomeprazole (NEXIUM) 40 MG capsule, Take 1 capsule (40 mg total) by mouth 2 (two) times daily before a meal. PLEASE SCHEDULE A YEARLY FOLLOW UP., Disp: 180 capsule, Rfl: 3   fluticasone (FLONASE) 50 MCG/ACT nasal spray, SPRAY 1 SPRAY IN EACH NOSTRIL ONCE DAILY AS NEEDED FOR ALLERGIES  OR RHINITIS, Disp: 16 mL, Rfl: 2   lamoTRIgine (LAMICTAL) 25 MG tablet, Take one tablet by mouth twice daily, Disp: 60 tablet, Rfl: 1   ondansetron (ZOFRAN-ODT) 8 MG disintegrating tablet, Take 1 tablet (8 mg total) by mouth every 8 (eight) hours as needed for nausea or vomiting., Disp: 20 tablet, Rfl: 0   sucralfate (CARAFATE) 1 g tablet, Take 1 tablet (1 g total) by mouth every 6 (six) hours as needed. Slowly dissolve 1 tablet in 1 Tablespoon of distilled water prior to taking, Disp: 60 tablet, Rfl: 1   traZODone (DESYREL) 50 MG tablet, TAKE 2 TABLETS BY MOUTH AT  BEDTIME, Disp: 90 tablet, Rfl: 0 Medication Side Effects: none  Family Medical/ Social History: Changes? No  MENTAL HEALTH EXAM:  There were no vitals taken for this visit.There is no height or weight on file to calculate BMI.  General Appearance: Casual, Neat, and Well Groomed  Eye Contact:  Good  Speech:  Clear and Coherent  Volume:  Normal  Mood:  NA  Affect:  Appropriate  Thought Process:  Coherent  Orientation:  Full (Time, Place, and Person)  Thought Content: Logical   Suicidal Thoughts:  No  Homicidal Thoughts:  No  Memory:  WNL  Judgement:  Good  Insight:  Good  Psychomotor Activity:  Normal  Concentration:  Concentration: Good  Recall:  Good  Fund of Knowledge: Good  Language: Good  Assets:  Desire for Improvement  ADL's:  Intact  Cognition: WNL  Prognosis:  Good    DIAGNOSES: No  diagnosis found.  Receiving Psychotherapy: No    RECOMMENDATIONS:   Greater than 50% of 30 face to face time with patient was spent on counseling and coordination of care. We discussed her current stability. Pain has improved but we discussed her continue problems with anger and irritability.  We agreed to: Will continue Lamictal 50 mg daily Will continue Cymbalta 60 mg twice daily To continue Trazodone 50 mg at bedtime as reported by pt Continue Buspar to 30 mg twice daily, at least 7-8 hours between doses. Continue  Xanax 0.5 mg twice daily for severe anxiety only.  To report worsening symptoms or side effects promptly Will follow up in 8 weeks to reassess Provided emergency contact information Discussed potential metabolic side effects associated with atypical antipsychotics, as well as potential risk for movement side effects. Advised pt to contact office if movement side effects occur.   Reviewed PDMP  Joan Flores, NP

## 2022-09-14 DIAGNOSIS — M76891 Other specified enthesopathies of right lower limb, excluding foot: Secondary | ICD-10-CM | POA: Diagnosis not present

## 2022-09-15 ENCOUNTER — Ambulatory Visit: Payer: Medicare Other | Admitting: Psychology

## 2022-09-15 DIAGNOSIS — F331 Major depressive disorder, recurrent, moderate: Secondary | ICD-10-CM | POA: Diagnosis not present

## 2022-09-15 NOTE — Progress Notes (Signed)
[facility] Counselor/Therapist Progress Note  Patient ID: Chimere Wilmeth, MRN: [id number],    Date: 09/15/2022  Time Spent: 2:00pm-2:50pm   50 minutes   Treatment Type: Individual Therapy  Reported Symptoms: sadness, stress  Mental Status Exam: Appearance:  Casual     Behavior: Appropriate  Motor: Normal  Speech/Language:  Normal Rate  Affect: Appropriate  Mood: normal  Thought process: normal  Thought content:   WNL  Sensory/Perceptual disturbances:   WNL  Orientation: oriented to person, place, time/date, and situation  Attention: Good  Concentration: Good  Memory: WNL  Fund of knowledge:  Good  Insight:   Good  Judgment:  Good  Impulse Control: Good   Risk Assessment: Danger to Self:  No Self-injurious Behavior: No Danger to Others: No Duty to Warn:no Physical Aggression / Violence:No  Access to Firearms a concern: No  Gang Involvement:No   Subjective: Pt present for face-to-face individual therapy via video.  Pt consents to telehealth video session and is aware of limitations of virtual sessions. Location of pt: home Location of therapist: home office.   Pt talked about her health.   She had surgery which went well.   Pt is now noticing pain on her the right side and may have to have surgery for that in about a year.  Pt is still limited on what exercise she can do.  Pt has two more surgeries in the future.   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   Pt talked about her dog Piper.  They are continuing to bond and pt feels close to her.   Having her dog is very therapeutic for pt.   Pt talked about the death of her god father.  She is still grieving that loss.  Pt talked about issues with some friendships.  There have been a couple of friendships that are disrupted.  Helped pt process her feelings and relationship dynamics.  Worked on self care strategies.   Provided supportive therapy.    Interventions:  Cognitive Behavioral Therapy and Insight-Oriented  Diagnosis:  F33.1  Plan of Care: Recommend ongoing therapy.  Pt participated in setting treatment goals.  She wants to improve coping skills and self esteem.   Plan to meet monthly.    Treatment Plan (Treatment Plan Target Date:  05/30/2023) Client Abilities/Strengths  Pt is bright, engaging, and motivated for therapy.   Client Treatment Preferences  Individual therapy.  Client Statement of Needs  Improve coping skills.  Symptoms  Depressed or irritable mood. Low self-esteem. Unresolved grief issues.   Problems Addressed  Unipolar Depression Goals 1. Alleviate depressive symptoms and return to previous level of effective functioning. 2. Appropriately grieve the loss in order to normalize mood and to return to previously adaptive level of functioning. Objective Learn and implement behavioral strategies to overcome depression. Target Date: 2023-05-30 Frequency: Monthly  Progress: 50 Modality: individual  Related Interventions Engage the client in "behavioral activation," increasing his/her activity level and contact with sources of reward, while identifying processes that inhibit activation.  Use behavioral techniques such as instruction, rehearsal, role-playing, role reversal, as needed, to facilitate activity in the client's daily life; reinforce success. Assist the client in developing skills that increase the likelihood of deriving pleasure from behavioral activation (e.g., assertiveness skills, developing an exercise plan, less internal/more external focus, increased social involvement); reinforce success. Objective Identify important people in life, past and present, and describe the quality, good and poor, of those relationships. Target Date:  2023-05-30 Frequency: Monthly  Progress: 50 Modality: individual  Related Interventions Conduct Interpersonal Therapy beginning with the assessment of the client's "interpersonal  inventory" of important past and present relationships; develop a case formulation linking depression to grief, interpersonal role disputes, role transitions, and/or interpersonal deficits). Objective Learn and implement problem-solving and decision-making skills. Target Date: 2023-05-30 Frequency: Monthly  Progress: 50 Modality: individual  Related Interventions Conduct Problem-Solving Therapy using techniques such as psychoeducation, modeling, and role-playing to teach client problem-solving skills (i.e., defining a problem specifically, generating possible solutions, evaluating the pros and cons of each solution, selecting and implementing a plan of action, evaluating the efficacy of the plan, accepting or revising the plan); role-play application of the problem-solving skill to a real life issue. Encourage in the client the development of a positive problem orientation in which problems and solving them are viewed as a natural part of life and not something to be feared, despaired, or avoided. 3. Develop healthy interpersonal relationships that lead to the alleviation and help prevent the relapse of depression. 4. Develop healthy thinking patterns and beliefs about self, others, and the world that lead to the alleviation and help prevent the relapse of depression. 5. Recognize, accept, and cope with feelings of depression. Diagnosis F33.1  Conditions For Discharge Achievement of treatment goals and objectives   Salomon Fick, LCSW

## 2022-09-21 ENCOUNTER — Ambulatory Visit: Payer: Medicare Other | Admitting: Gastroenterology

## 2022-09-21 ENCOUNTER — Encounter: Payer: Self-pay | Admitting: Gastroenterology

## 2022-09-21 VITALS — BP 122/74 | HR 74 | Ht 63.0 in | Wt 128.0 lb

## 2022-09-21 DIAGNOSIS — D509 Iron deficiency anemia, unspecified: Secondary | ICD-10-CM | POA: Diagnosis not present

## 2022-09-21 DIAGNOSIS — K219 Gastro-esophageal reflux disease without esophagitis: Secondary | ICD-10-CM | POA: Diagnosis not present

## 2022-09-21 DIAGNOSIS — Z79899 Other long term (current) drug therapy: Secondary | ICD-10-CM

## 2022-09-21 MED ORDER — NA SULFATE-K SULFATE-MG SULF 17.5-3.13-1.6 GM/177ML PO SOLN: 1.0000 | Freq: Once | ORAL | 0 refills | Status: AC

## 2022-09-21 NOTE — Patient Instructions (Addendum)
You have been scheduled for a colonoscopy. Please follow written instructions given to you at your visit today.   Please pick up your prep supplies at the pharmacy within the next 1-3 days.  If you use inhalers (even only as needed), please bring them with you on the day of your procedure.  DO NOT TAKE 7 DAYS PRIOR TO TEST- Trulicity (dulaglutide) Ozempic, Wegovy (semaglutide) Mounjaro (tirzepatide) Bydureon Bcise (exanatide extended release)  DO NOT TAKE 1 DAY PRIOR TO YOUR TEST Rybelsus (semaglutide) Adlyxin (lixisenatide) Victoza (liraglutide) Byetta (exanatide) ___________________________________________________________________________  Resume iron.  Continue Nexium  You will be due for labs in 3 months, in November. We will remind you when it is time to go to the lab.  Thank you for entrusting me with your care and for choosing [facility], Dr. Ileene Patrick  If your blood pressure at your visit was 140/90 or greater, please contact your primary care physician to follow up on this. ______________________________________________________  If you are age 6 or older, your body mass index should be between 23-30. Your Body mass index is 22.67 kg/m. If this is out of the aforementioned range listed, please consider follow up with your Primary Care Provider.  If you are age 15 or younger, your body mass index should be between 19-25. Your Body mass index is 22.67 kg/m. If this is out of the aformentioned range listed, please consider follow up with your Primary Care Provider.  ________________________________________________________  The [facility] providers would like to encourage you to use [facility] to communicate with providers for non-urgent requests or questions.  Due to long hold times on the telephone, sending your provider a message by [facility] may be a faster and more efficient way to get a response.  Please allow 48 business hours for a response.  Please  remember that this is for non-urgent requests.  _______________________________________________________  Due to recent changes in healthcare laws, you may see the results of your imaging and laboratory studies on MyChart before your provider has had a chance to review them.  We understand that in some cases there may be results that are confusing or concerning to you. Not all laboratory results come back in the same time frame and the provider may be waiting for multiple results in order to interpret others.  Please give [place] 48 hours in order for your provider to thoroughly review all the results before contacting the office for clarification of your results.

## 2022-09-21 NOTE — Progress Notes (Signed)
HPI :  62 year old female here for follow-up visit for iron deficiency, history of diverticulitis, GERD, large hiatal hernia.   She was noted to have an iron deficiency dating back to June 2023.  Her last colonoscopy had been in 2021, and with her GERD symptoms we decided to proceed with EGD at that time.  She had an EGD in June 2023, results as outlined below.  In short, she had 5 cm hiatal hernia, LA grade a esophagitis, gastritis.  We placed her on iron supplementation and she has been on Nexium 40 mg twice daily.  Her reflux has been pretty well-controlled on higher dose PPI.  She states she is doing pretty well and feeling much better in general.  Recall she had some severe pain in her flank/hip that turned out to be ischiofemoral nerve impingement and she had surgery for that which has relieved the pain quite a bit.  That was on her left side, she is now has the same issue on her right but is not nearly as bad.  She is considering surgical intervention on that as well.  On iron supplementation she has had some intermittent dark stools.  She was questioning if she was having internal bleeding.  We checked a hemoglobin and a BUN level which were both normal on June 28.  On June 4 she had a normal hemoglobin but her ferritin has drifted to 7.6 while taking oral iron.  Her iron level and iron sat is otherwise okay.  Hemoglobin remains in the 14's.  She is not seeing any blood in her stool otherwise.  She came off oral iron to see if that would reduce the dark stools in her stools lightened up back to normal.  Reassured her that the dark stools are due to the iron given this evaluation.  We discussed if she want to consider colonoscopy given her recurrent iron deficiency while on iron supplementation.  She does want to proceed with that.  She has a history of seizure disorder that is well-controlled on her regimen.  Otherwise she feels okay at baseline.  Has some fatigue she attributes to low iron  levels.  She has been trying to lose weight and has lost 30 pounds since COVID.  Her appetite is not as good as it used to be however.  She does think Nexium is controlling her reflux pretty well.   Prior workup:  EGD 05/24/2016 - 4cm HH, 3mm polyp removed at 30cm,  Colonoscopy 09/2013 - normal   GES 03/27/18 - normal   CT scan 04/21/19 - IMPRESSION: 1. Moderate to large hiatal hernia, paraesophageal and sliding type, with no signs of stranding about the herniated stomach. This is new when compared to the previous examination. 2. No evidence of acute intra-abdominal pathology.     CT scan 09/04/18: [facility] - suspected sigmoid diverticulitis, moderate hiatal hernia     05/09/19 colonoscopy -The examined portion of the ileum was normal. - One 3 mm polyp in the cecum, removed with a cold snare. Resected and retrieved. - Diverticulosis in the sigmoid colon. - The examination was otherwise normal   05/09/19  EGD -Esophagogastric landmarks identified. - 5 cm hiatal hernia. - Normal esophagus otherwise - A single gastric polyp, benign appearing. Resected and retrieved. - Normal stomach otherwise - biopsies taken for H pylori for eradication testing post therapy - Normal duodenal bulb and second portion of the duodenum.     MR pelvis 06/24/21: IMPRESSION: Mild bilateral hip osteoarthritis with degenerative  anterior superior labral tearing. Findings of left-sided ischiofemoral impingement. Tendinosis and low-grade partial tearing of the left proximal hamstrings. Mild peritrochanteric edema bilaterally. No evidence of gluteal tendon tear.     CT abdomen / pelvis 07/20/21: IMPRESSION: No abnormality seen to explain the clinical presentation. The patient does have diverticulosis of the left colon but there is no visible diverticulitis. Low level diverticulitis can be inapparent at imaging.   Hiatal hernia as seen previously without complicating feature.     EGD 08/18/21: -  Esophagogastric landmarks were identified: the Z-line was found at 33 cm, the gastroesophageal junction was found at 33 cm and the upper extent of the gastric folds was found at 38 cm from the incisors. Findings: - A 5 cm hiatal hernia was present. No obvious cameron lesions noted. - LA Grade A esophagitis was found at the GEJ. - The exam of the esophagus was otherwise normal. - A single 3 mm sessile polyp was found in the gastric body. The polyp was removed with a cold biopsy forceps. Resection and retrieval were complete. - Patchy mild inflammation characterized by erythema and granularity was found in the distal gastric body. Biopsies were taken with a cold forceps for Helicobacter pylori testing from antrum, body, incisura. - The exam of the stomach was otherwise normal. - The examined duodenum was normal.  1. Surgical [P], gastric - GASTRIC MUCOSA SHOWING REACTIVE GASTROPATHY WITH SUBEPITHELIAL CRYSTALLINE IRON DEPOSITS, CONSISTENT WITH IRON PILL GASTROPATHY - HELICOBACTER PYLORI-LIKE ORGANISMS ARE NOT IDENTIFIED ON ROUTINE H&E STAIN 2. Surgical [P], gastric polyp, polyp (1) - FUNDIC GLAND POLYP - NEGATIVE FOR DYSPLASIA  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    Lump or mass in breast    Meningitis, unspecified(322.9)    Oral thrush    Other specified glaucoma    Perforation of tympanic membrane, unspecified    Radial styloid tenosynovitis     Seizures (HCC)      Past Surgical History:  Procedure Laterality Date   ADENOIDECTOMY     ANKLE SURGERY  1992   right ankle - scar tissue   DILATION AND CURETTAGE OF UTERUS  2006   History of Menometrorrhagia   INNER EAR SURGERY  1966 to 1986   6 surgeries on right ear   LAPAROSCOPY  1993   normal   REDUCTION MAMMAPLASTY Bilateral 2018   uterine ablation  2011   WRIST SURGERY  2001   for de-quervaine - bilateral wrists - tendonitis   Family History  Problem Relation Age of Onset   Dementia Mother    Alcohol abuse Mother    Ovarian cancer Mother    Heart disease Mother    Hypertension Mother    Coronary artery disease Mother    Colon polyps Mother 83   Heart failure Father  Hypertension Father    Hyperlipidemia Father    Diabetes Father    Alcohol abuse Sister    Anxiety disorder Maternal Aunt    Depression Maternal Aunt    Alcohol abuse Maternal Aunt    Alcohol abuse Maternal Uncle    Alcohol abuse Maternal Grandfather    Breast cancer Maternal Grandmother    Colon cancer Neg Hx    Esophageal cancer Neg Hx    Stomach cancer Neg Hx    Rectal cancer Neg Hx    Social History   Tobacco Use   Smoking status: Never   Smokeless tobacco: Never  Vaping Use   Vaping status: Never Used  Substance Use Topics   Alcohol use: Yes    Alcohol/week: 7.0 standard drinks of alcohol    Types: 7 Glasses of wine per week    Comment: 2 glasses of wine per night   Drug use: No   Current Outpatient Medications  Medication Sig Dispense Refill   ALPRAZolam (XANAX) 0.5 MG tablet TAKE ONE TABLET BY MOUTH TWICE A DAY AS NEEDED FOR ANXIETY 60 tablet 2   busPIRone (BUSPAR) 30 MG tablet Take 1 tablet (30 mg total) by mouth 2 (two) times daily. 60 tablet 3   dicyclomine (BENTYL) 10 MG capsule TAKE ONE CAPSULE BY MOUTH EVERY 6 HOURS AS NEEDED FOR ABDOMINAL PAIN 30 capsule 1   DULoxetine (CYMBALTA) 60 MG capsule Take 1 capsule (60 mg total) by mouth 2 (two) times daily. 270 capsule 2    esomeprazole (NEXIUM) 40 MG capsule Take 1 capsule (40 mg total) by mouth 2 (two) times daily before a meal. PLEASE SCHEDULE A YEARLY FOLLOW UP. 180 capsule 3   fluticasone (FLONASE) 50 MCG/ACT nasal spray SPRAY 1 SPRAY IN EACH NOSTRIL ONCE DAILY AS NEEDED FOR ALLERGIES OR RHINITIS 16 mL 2   lamoTRIgine (LAMICTAL) 25 MG tablet Take one tablet by mouth twice daily 60 tablet 1   ondansetron (ZOFRAN-ODT) 8 MG disintegrating tablet Take 1 tablet (8 mg total) by mouth every 8 (eight) hours as needed for nausea or vomiting. 20 tablet 0   sucralfate (CARAFATE) 1 g tablet Take 1 tablet (1 g total) by mouth every 6 (six) hours as needed. Slowly dissolve 1 tablet in 1 Tablespoon of distilled water prior to taking 60 tablet 1   traZODone (DESYREL) 50 MG tablet TAKE 2 TABLETS BY MOUTH AT  BEDTIME 90 tablet 0   No current facility-administered medications for this visit.   Allergies  Allergen Reactions   Prednisone Rash    Face redness and swollen per patient   Pregabalin Other (See Comments)    Delusions   Remeron [Mirtazapine] Other (See Comments)    Severe hallucinations     Review of Systems: All systems reviewed and negative except where noted in HPI.   Lab Results  Component Value Date   IRON 113 07/25/2022   TIBC 448.0 07/25/2022   FERRITIN 7.6 (L) 07/25/2022    Lab Results  Component Value Date   WBC 6.9 07/25/2022   HGB 14.4 08/18/2022   HCT 42.9 07/25/2022   MCV 95.9 07/25/2022   PLT 203.0 07/25/2022     Physical Exam: BP 122/74   Pulse 74   Ht 5\' 3"  (1.6 m)   Wt 128 lb (58.1 kg)   BMI 22.67 kg/m  Constitutional: Pleasant,well-developed, female in no acute distress. Neurological: Alert and oriented to person place and time. Psychiatric: Normal mood and affect. Behavior is normal.   ASSESSMENT: 62  y.o. female here for assessment of the following  1. Iron deficiency anemia, unspecified iron deficiency anemia type   2. Gastroesophageal reflux disease, unspecified  whether esophagitis present   3. Long-term current use of proton pump inhibitor therapy    Recurrent iron deficiency anemia.  EGD last year showed 5 cm hiatal hernia, esophagitis, and gastritis which was thought to be the likely cause.  On iron supplementation and twice daily PPI her anemia and iron deficiency resolved.  Clinically she is feeling much better on the regimen, reflux seems controlled.  Her pain is much better controlled following surgery for her nerve impingement.  She is considering another intervention on the other side for recurrent pain there.  Generally she is doing much better however.  While her hemoglobin has remained normal in the 14s, despite taking oral iron her ferritin has drifted to 7.6.  Iron levels and iron sat remains okay.  We discussed if she want to pursue a colonoscopy given recurrent iron deficiency, her last exam was 3 years ago.  Following discussion of risks and benefits, she wanted to proceed with colonoscopy.    Otherwise we had suspected her dark stools were due to iron although she was not convinced of this.  Hemoglobin and BUN normal.  She stopped iron for some time and the dark stools resolved.  She should resume iron at this time and understand that this may cause some dark stools.  She is avoiding NSAIDs otherwise.  We did discuss long-term risks of chronic PPI therapy and she understands, wishes to continue at present dosing for now.   PLAN: - resume iron daily  - colonoscopy at the [facility] - given recurrent IDA despite iron use - continue BID nexium for now - discussed long term risks / benefits of chronic PPI - repeat labs in 3 months - CBC, TIBC / ferritin - avoid routine use of NSAIDs, okay to take sparingly for hip pain  Harlin Rain, MD [facility]

## 2022-09-28 ENCOUNTER — Encounter: Payer: Medicare Other | Admitting: Internal Medicine

## 2022-09-28 ENCOUNTER — Other Ambulatory Visit: Payer: Self-pay | Admitting: Behavioral Health

## 2022-09-28 DIAGNOSIS — R454 Irritability and anger: Secondary | ICD-10-CM

## 2022-10-05 ENCOUNTER — Encounter (INDEPENDENT_AMBULATORY_CARE_PROVIDER_SITE_OTHER): Payer: Self-pay

## 2022-10-12 ENCOUNTER — Ambulatory Visit: Payer: Medicare Other | Admitting: Psychology

## 2022-10-17 ENCOUNTER — Other Ambulatory Visit: Payer: Self-pay | Admitting: Behavioral Health

## 2022-10-17 DIAGNOSIS — F331 Major depressive disorder, recurrent, moderate: Secondary | ICD-10-CM

## 2022-10-17 DIAGNOSIS — F411 Generalized anxiety disorder: Secondary | ICD-10-CM

## 2022-10-18 ENCOUNTER — Encounter: Payer: Self-pay | Admitting: Gastroenterology

## 2022-10-24 ENCOUNTER — Encounter: Payer: Self-pay | Admitting: Gastroenterology

## 2022-11-02 DIAGNOSIS — L720 Epidermal cyst: Secondary | ICD-10-CM | POA: Diagnosis not present

## 2022-11-02 DIAGNOSIS — L820 Inflamed seborrheic keratosis: Secondary | ICD-10-CM | POA: Diagnosis not present

## 2022-11-06 ENCOUNTER — Ambulatory Visit: Payer: Medicare Other | Admitting: Behavioral Health

## 2022-11-06 ENCOUNTER — Encounter: Payer: Medicare Other | Admitting: Gastroenterology

## 2022-11-13 ENCOUNTER — Encounter: Payer: Self-pay | Admitting: Internal Medicine

## 2022-11-13 ENCOUNTER — Ambulatory Visit: Payer: Medicare Other | Admitting: Internal Medicine

## 2022-11-13 VITALS — BP 123/83 | HR 86 | Temp 97.6°F | Ht 63.0 in | Wt 128.8 lb

## 2022-11-13 DIAGNOSIS — M25551 Pain in right hip: Secondary | ICD-10-CM | POA: Diagnosis not present

## 2022-11-13 DIAGNOSIS — M545 Low back pain, unspecified: Secondary | ICD-10-CM

## 2022-11-13 DIAGNOSIS — F332 Major depressive disorder, recurrent severe without psychotic features: Secondary | ICD-10-CM | POA: Diagnosis not present

## 2022-11-13 DIAGNOSIS — K319 Disease of stomach and duodenum, unspecified: Secondary | ICD-10-CM | POA: Diagnosis not present

## 2022-11-13 DIAGNOSIS — J301 Allergic rhinitis due to pollen: Secondary | ICD-10-CM

## 2022-11-13 DIAGNOSIS — G3184 Mild cognitive impairment, so stated: Secondary | ICD-10-CM

## 2022-11-13 DIAGNOSIS — M797 Fibromyalgia: Secondary | ICD-10-CM | POA: Diagnosis not present

## 2022-11-13 DIAGNOSIS — G35D Multiple sclerosis, unspecified: Secondary | ICD-10-CM

## 2022-11-13 DIAGNOSIS — G35 Multiple sclerosis: Secondary | ICD-10-CM

## 2022-11-13 DIAGNOSIS — J330 Polyp of nasal cavity: Secondary | ICD-10-CM

## 2022-11-13 DIAGNOSIS — H900 Conductive hearing loss, bilateral: Secondary | ICD-10-CM | POA: Diagnosis not present

## 2022-11-13 DIAGNOSIS — H4089 Other specified glaucoma: Secondary | ICD-10-CM

## 2022-11-13 DIAGNOSIS — D500 Iron deficiency anemia secondary to blood loss (chronic): Secondary | ICD-10-CM

## 2022-11-13 DIAGNOSIS — M25559 Pain in unspecified hip: Secondary | ICD-10-CM | POA: Insufficient documentation

## 2022-11-13 DIAGNOSIS — E611 Iron deficiency: Secondary | ICD-10-CM

## 2022-11-13 DIAGNOSIS — N6019 Diffuse cystic mastopathy of unspecified breast: Secondary | ICD-10-CM | POA: Diagnosis not present

## 2022-11-13 DIAGNOSIS — R7302 Impaired glucose tolerance (oral): Secondary | ICD-10-CM | POA: Diagnosis not present

## 2022-11-13 DIAGNOSIS — M25552 Pain in left hip: Secondary | ICD-10-CM

## 2022-11-13 DIAGNOSIS — E559 Vitamin D deficiency, unspecified: Secondary | ICD-10-CM

## 2022-11-13 DIAGNOSIS — Z780 Asymptomatic menopausal state: Secondary | ICD-10-CM

## 2022-11-13 MED ORDER — VITAMIN D3 125 MCG (5000 UT) PO CAPS: 5000.0000 [IU] | ORAL_CAPSULE | Freq: Every day | ORAL | 3 refills | Status: DC

## 2022-11-13 NOTE — Assessment & Plan Note (Addendum)
She feels comfortable with Avelina Laine, "best I've been 30y" Currently on Cymbalta 60 twice daily, Lamictal 25 mg by mouth twice daily, buspirone. At night trazodone.  As needed xanax 0.5 but doesn't mix with tizanidine.   History or severe depression - followed by Dr. Evelene Croon Additional diagnosis: ADD, agraphobia, self-mutilation. Med added - abilify

## 2022-11-13 NOTE — Assessment & Plan Note (Addendum)
Discussed naltrexone but its already controlled with Cymbalta.

## 2022-11-13 NOTE — Assessment & Plan Note (Signed)
Encouraged patient to to eat fish and take supplement for hypertriglyceridemia.

## 2022-11-13 NOTE — Progress Notes (Signed)
[facility]: [phone]   -- Medical Office Visit --  Patient:  Jordan [name]      Age: 62 y.o.       Sex:  female  Date:   11/13/2022 Patient Care Team: [name], MD as PCP - General (Internal Medicine) Jodelle Red, MD as PCP - Cardiology (Cardiology) Milagros Evener, MD (Psychiatry) Love, Genene Churn, MD (Neurology) Hobson-Webb, Remonia Richter, MD (Neurology) Today's Healthcare Provider: [name], MD       Assessment & Plan Severe recurrent major depression without psychotic features [facility]) She feels comfortable with Avelina Laine, "best I've been 30y" Currently on Cymbalta 60 twice daily, Lamictal 25 mg by mouth twice daily, buspirone. At night trazodone.  As needed xanax 0.5 but doesn't mix with tizanidine.   History or severe depression - followed by Dr. Evelene Croon Additional diagnosis: ADD, agraphobia, self-mutilation. Med added - abilify Iron deficiency anemia due to chronic blood loss  Iron deficiency  Back pain, lumbosacral  Bilateral hip pain  MULTIPLE SCLEROSIS, PROGRESSIVE/RELAPSING  Dyspepsia and disorder of function of stomach  Fibrocystic breast disease (FCBD), unspecified laterality  Fibromyalgia Discussed naltrexone but its already controlled with Cymbalta. Other glaucoma of both eyes  Seasonal allergic rhinitis due to pollen  IGT (impaired glucose tolerance)  Mild cognitive impairment with memory loss  NASAL POLYP  Conductive hearing loss, bilateral  Postmenopausal estrogen deficiency  Vitamin D deficiency  Assessment and Plan: Today was a transfer of care from Dr. Ardyth Harps to Dr. Jon Billings for her primary care going forward.  Reason for transfer can be seen in the chart records    Multiple Sclerosis History of diagnosis in 2011 with MRI findings consistent with MS. No definitive diagnosis or treatment due to insurance issues. Currently experiencing eye issues. -Order MRI brain to assess  for changes. -Refer to neurologist for further evaluation and potential treatment initiation.  Depression/Anxiety Managed with Cymbalta 60mg  twice daily, Lamictal 25mg  twice daily, Buspar, and Trazodone 50mg  at night. Reports feeling the best in 30 years. -Continue current regimen. -Consider slow taper in the future, but maintain current regimen due to positive response.  Iron Deficiency Recent history of anemia, currently resolved. Reports feeling exhausted. -Order iron studies to assess current status. -Continue iron supplementation.  Vitamin D Deficiency History of deficiency, stopped supplementation. -Resume Vitamin D supplementation. -Check Vitamin D levels.  Hip Pain History of left hip replacement, right hip replacement scheduled for March. Reports pain due to ischiofemoral nerve impingement. -Continue current pain management plan.  Fibromyalgia Managed with Cymbalta 60mg  twice daily. -Continue current regimen. -Discuss potential use of naltrexone, but currently controlled with Cymbalta.  Gastrointestinal Issues Reports bad stomach, on various medications. Scheduled for endoscopy and colonoscopy in November. -Continue current medications. -Proceed with scheduled procedures.  Hearing Loss History of six surgeries, significant hearing loss. -Refer for audiology evaluation.  Sleep Issues Reports poor sleep, possibly related to sinus issues or MS. -Advise patient to monitor sleep. -Consider sleep evaluation.  General Health Maintenance -Scheduled for bone density test and mammogram in the coming weeks. -Wellness visit scheduled for early March.        Diagnoses and all orders for this visit: Severe recurrent major depression without psychotic features (HCC) Iron deficiency anemia due to chronic blood loss Iron deficiency Back pain, lumbosacral Bilateral hip pain MULTIPLE SCLEROSIS, PROGRESSIVE/RELAPSING -     Ambulatory referral to Neurology -     MR Brain W  Wo Contrast; Future Dyspepsia and disorder of function of  stomach Fibrocystic breast disease (FCBD), unspecified laterality Fibromyalgia Other glaucoma of both eyes Seasonal allergic rhinitis due to pollen IGT (impaired glucose tolerance) Mild cognitive impairment with memory loss NASAL POLYP Conductive hearing loss, bilateral -     Ambulatory referral to ENT Postmenopausal estrogen deficiency -     Cholecalciferol (VITAMIN D3) 125 MCG (5000 UT) CAPS; Take 1 capsule (5,000 Units total) by mouth daily. Vitamin D deficiency -     Cholecalciferol (VITAMIN D3) 125 MCG (5000 UT) CAPS; Take 1 capsule (5,000 Units total) by mouth daily.  Recommended follow-up:  Future Appointments  Date Time Provider Department Center  11/27/2022 12:30 PM [facility] MM 2 [facility]  11/27/2022  1:00 PM [facility] DX DEXA 1 [facility]  11/28/2022  1:00 PM White, Watt Climes, NP [facility] None  12/08/2022  2:00 PM Armbruster, [name], MD [facility]  05/14/2023  9:00 AM [name], MD [facility]            Subjective   62 y.o. female who has Claustrophobia; MULTIPLE SCLEROSIS, PROGRESSIVE/RELAPSING; GLAUCOMA NEC; HAY FEVER; FIBROCYSTIC BREAST DISEASE; Headache; Back pain, thoracic; Bilateral dry eyes; Mild cognitive impairment with memory loss; Dyspepsia and disorder of function of stomach; Fibromyalgia; Back pain, lumbosacral; Degeneration of cervical intervertebral disc; Severe recurrent major depression without psychotic features (HCC); Low back pain; Hypertriglyceridemia; Vitamin D deficiency; Iron deficiency; and Hip pain on their problem list. Her reasons/main concerns/chief complaints for today's office visit are Transfer of care, Leg Pain (Right leg from fall on ladder. Surgery will be in March 2024.), and Low iron   ------------------------------------------------------------------------------------------------------------------------ AI-Extracted: Discussed the use of AI scribe  software for clinical note transcription with the patient, who gave verbal consent to proceed.  History of Present Illness   The patient, with a history of multiple sclerosis, anxiety, depression, and vitamin D and iron deficiencies, presents with a variety of health concerns. She has recently lost 30 pounds and aims to lose another five. She reports hip pain due to a fall and subsequent surgeries. The patient also mentions fatigue, which she attributes to her iron deficiency. She has stopped taking Adderall, which she was previously prescribed for anxiety and depression, and has been managing her health with a variety of other medications. She has been experiencing some vision issues, which she plans to address with her eye doctor. The patient also mentions a history of fibromyalgia, which she manages with Cymbalta.      She has a past medical history of Acute upper respiratory infections of unspecified site, ADD (attention deficit disorder), Allergic rhinitis due to pollen, Anal fissure, Anemia, unspecified, Anxiety disorder, Asthma, Carpal tunnel syndrome (04/25/2007), Chicken pox, Complication of anesthesia, Cough (07/03/2011), Depression, recurrent (HCC) (12/05/2006), Depressive disorder, not elsewhere classified, Diffuse cystic mastopathy, Falls, Fibromyalgia, GERD (gastroesophageal reflux disease), Headache(784.0), History of anorexia nervosa, History of bulimia, IBS (irritable bowel syndrome), IGT (impaired glucose tolerance) (03/24/2021), Incontinence without sensory awareness (06/01/2009), 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, Iron deficiency anemia (03/24/2021), Lump or mass in breast, Meningitis, unspecified(322.9), NASAL POLYP (05/14/2009), NECK PAIN, CHRONIC (02/19/2007), Oral thrush, Other specified glaucoma, Perforation of tympanic membrane, unspecified, Radial styloid tenosynovitis, Seizures (HCC), and TYMPANIC MEMBRANE PERFORATION (12/05/2006).  Problem list overviews that were updated at today's  visit: Problem  Iron Deficiency  Hip Pain   Ischiofemoral nerve impingement around trochanter- on MRI. Seeing orthopedic specialist for this at [facility], and he already fixed left, plans right   Vitamin D Deficiency   Clinical symptoms that can be associated  with vitamin D deficiency include bone pain, muscle weakness, and general fatigue Vitamin D levels are affected by dietary choices and sunlight exposure  Lab Results  Component Value Date   VD25OH 44.56 06/14/2022   VD25OH 22.07 (L) 04/13/2022   VD25OH 30.66 03/21/2021   VD25OH 37.54 04/21/2020   VD25OH 27.67 08/15/2013   Lab Results  Component Value Date   TSH 1.28 04/13/2022   ALKPHOS 70 04/13/2022   CALCIUM 9.4 04/13/2022   Lab Results  Component Value Date   CALCIUM 9.4 04/13/2022   CALCIUM 9.2 07/20/2021   CALCIUM 9.5 03/21/2021   CALCIUM 9.4 10/22/2020   CALCIUM 9.4 04/21/2020      Hypertriglyceridemia   Medications: not yet discussed Lab Results  Component Value Date   HDL 55.20 04/13/2022   HDL 48.20 03/21/2021   HDL 66.70 04/21/2020   CHOLHDL 4 04/13/2022   CHOLHDL 4 03/21/2021   CHOLHDL 3 04/21/2020   Lab Results  Component Value Date   LDLCALC 106 (H) 04/13/2022   LDLCALC 99 04/21/2020   LDLCALC 106 (H) 08/15/2013   LDLDIRECT 119.0 03/21/2021   Lab Results  Component Value Date   TRIG 191.0 (H) 04/13/2022   TRIG 202.0 (H) 03/21/2021   TRIG 180.0 (H) 04/21/2020   Lab Results  Component Value Date   CHOL 200 04/13/2022   CHOL 193 03/21/2021   CHOL 202 (H) 04/21/2020   The 10-year ASCVD risk score (Arnett DK, et al., 2019) is: 3.8%   Values used to calculate the score:     Age: 63 years     Sex: Female     Is Non-Hispanic African American: No     Diabetic: No     Tobacco smoker: No     Systolic Blood Pressure: 123 mmHg     Is BP treated: No     HDL Cholesterol: 55.2 mg/dL     Total Cholesterol: 200 mg/dL Lab Results  Component Value Date   ALT 14 04/13/2022   AST 19 04/13/2022    ALKPHOS 70 04/13/2022   TSH 1.28 04/13/2022   HGBA1C 5.3 04/13/2022   Body mass index is 22.82 kg/m.  Lipoprotein(a), Apolipoprotein B (ApoB), and High-sensitivity C-reactive protein (hs-CRP) No results found for: "HSCRP", "LIPOA" Improving Your Cholesterol: Diet: Focus on a Mediterranean-style diet, limit saturated fats and sugars, and increase omega-3 fatty acids (fish, flaxseeds,nuts,extra virgin olive oil, avocados). Exercise: Engage in regular physical activity (aerobic exercises are particularly beneficial for HDL). Weight Management: Maintain a healthy weight. Smoking Cessation: Quitting smoking improves cholesterol levels.    Severe Recurrent Major Depression Without Psychotic Features (Hcc)   Seen by Avelina Laine, reports anxiety and medication(s) could be contributing so she weaned Adderall Reports she is disabled mostly due to depression   Fibromyalgia   On Cymbalta 60 mg by mouth twice daily History Lyrica Gabapentin adverse reaction.   Back Pain, Lumbosacral   Fell off ladder loading kayak    Dyspepsia and Disorder of Function of Stomach   Intermittent chronic episgastric discomfort Following with Dr. Catalina Pizza Nexium/dicyclomine   Mild Cognitive Impairment With Memory Loss   Eval Dr. Maple Hudson July '11: decreased executive function, decreased cognitive flexibility, poor working memory, impaired problem solving, diminished planning and organizational skills. Eval by [name], psychologist - spring '14 - ADD, cognitive impairment that was significant, cannot stay on task.   MULTIPLE SCLEROSIS, PROGRESSIVE/RELAPSING   Diagnosed 2011  Seen previously in [place] where diagnosis was made. Has seen Dr. Sandria Manly in [place]  last April '12 - multiple issues but no definitive diagnosis MS. Seen at [facility] - no definitive diagnosis MRI at [facility] in two weeks (Feb'15) Patient lost to follow up after insurance refused to pay for medication(s) prescribed by [place]  neurologist  FINDINGS:  No evidence for acute infarction, hemorrhage, mass lesion,  hydrocephalus, or extra-axial fluid. Normal for age cerebral volume.   Bilateral periatrial white matter lesions, right greater than left  consistent with the clinical diagnosis of chronic MS. No new white  matter lesions are seen.   No foci of chronic hemorrhage are evident. No apparent shearing  injury related to previous fall.   Post infusion, no abnormal enhancement of the brain or meninges.  Unremarkable pituitary and cerebellar tonsils. Flow voids are  maintained. Extracranial soft tissues unremarkable. No sinus or  mastoid disease. Major dural venous sinuses are patent.   IMPRESSION:  Minor white matter signal abnormality involving the optic radiations  in the periatrial region, right greater than left. In the  appropriate clinical setting, these are consistent with stable mild  chronic multiple sclerosis. No restricted diffusion or abnormal  postcontrast enhancement, nor are there new lesions, to suggest  acute disease progression.    Claustrophobia   Qualifier: Diagnosis of  By: Debby Bud MD, Rosalyn Gess    HAY FEVER   Seasonal allergies    GLAUCOMA NEC   Following with ophthalmology  every 6 months  Had surgery Doesn't need drops   FIBROCYSTIC BREAST DISEASE   Thought to have lump Family history breast cancer   Igt (Impaired Glucose Tolerance) (Resolved)   Lab Results  Component Value Date   HGBA1C 5.3 04/13/2022      Iron Deficiency Anemia (Resolved)  Routine Health Maintenance (Resolved)   Colonoscopy - Will schedule a colonoscopy in the next several months. (Feb '15) Mammography - Every year. Immunizations - Tetanus March '07   Cough (Resolved)   Followed in Pulmonary clinic/ [facility]/ [place]     - Spirometry nl during flare 07/03/2011   Had severe asthmatic bronchitis vs pneumonia requiring hospitalization April '13 for severe bronchospasm. Subsequently had  persistent cough and bronchospasm for several months.    Incontinence Without Sensory Awareness (Resolved)   Qualifier: Diagnosis of  By: Debby Bud MD, Rosalyn Gess    NASAL POLYP (Resolved)       Carpal Tunnel Syndrome (Resolved)   Qualifier: Diagnosis of  By: Debby Bud MD, Rosalyn Gess    NECK PAIN, CHRONIC (Resolved)   Qualifier: Diagnosis of  By: Debby Bud MD, Rosalyn Gess    ANEMIA-NOS (Resolved)   Lab Results  Component Value Date/Time   HGB 14.4 08/18/2022 01:25 PM   HGB 14.4 07/25/2022 02:14 PM   HGB 14.1 04/13/2022 08:37 AM   HGB 14.6 04/10/2022 02:02 PM   HGB 14.5 01/24/2022 03:17 PM   Lab Results  Component Value Date/Time   MCV 95.9 07/25/2022 02:14 PM   MCV 94.4 04/13/2022 08:37 AM   MCV 95.9 04/10/2022 02:02 PM   MCV 95.6 01/24/2022 03:17 PM   MCV 89.5 10/06/2021 04:26 PM      Component Value Date/Time   IRON 113 07/25/2022 1414   TIBC 448.0 07/25/2022 1414   FERRITIN 7.6 (L) 07/25/2022 1414   IRONPCTSAT 25.2 07/25/2022 1414    No results found for: "RETICCTPCT", "RETICCTABS", "PATHREVIEW"      Depression, Recurrent (Hcc) (Resolved)   Qualifier: Diagnosis of  By: Roxan Hockey CMA, Jessica   History or severe depression - followed by Dr. Evelene Croon Additional  diagnosis: ADD, agraphobia, self-mutilation. Med added - abilify   TYMPANIC MEMBRANE PERFORATION (Resolved)   Annotation: REPAIRED Qualifier: Diagnosis of  By: Roxan Hockey CMA, Jessica   Replacing diagnoses that were inactivated after the 05/22/22 regulatory import   DE QUERVAIN'S TENOSYNOVITIS (Resolved)   Annotation: with tendon release of right and left wrist in 2005 Qualifier: Diagnosis of  By: Roxan Hockey CMA, Jessica     BULIMIA, HX OF (Resolved)   Qualifier: History of  By: Roxan Hockey CMA, Karleen Dolphin, HX OF (Resolved)   Qualifier: History of  By: Roxan Hockey CMA, Jessica      Current Outpatient Medications on File Prior to Visit  Medication Sig   ALPRAZolam (XANAX) 0.5 MG tablet TAKE ONE  TABLET BY MOUTH TWICE A DAY AS NEEDED FOR ANXIETY   busPIRone (BUSPAR) 30 MG tablet TAKE 1 TABLET BY MOUTH 2 TIMES A DAY   dicyclomine (BENTYL) 10 MG capsule TAKE ONE CAPSULE BY MOUTH EVERY 6 HOURS AS NEEDED FOR ABDOMINAL PAIN   DULoxetine (CYMBALTA) 60 MG capsule Take 1 capsule (60 mg total) by mouth 2 (two) times daily.   fluticasone (FLONASE) 50 MCG/ACT nasal spray SPRAY 1 SPRAY IN EACH NOSTRIL ONCE DAILY AS NEEDED FOR ALLERGIES OR RHINITIS   lamoTRIgine (LAMICTAL) 25 MG tablet TAKE 1 TABLET BY MOUTH 2 TIMES A DAY   ondansetron (ZOFRAN-ODT) 4 MG disintegrating tablet    tiZANidine (ZANAFLEX) 2 MG tablet TAKE 1 TABLET BY MOUTH 3 TIMES A DAY AS NEEDED FOR PAIN/SPASM   traZODone (DESYREL) 50 MG tablet TAKE 2 TABLETS BY MOUTH AT  BEDTIME   esomeprazole (NEXIUM) 40 MG capsule Take 1 capsule (40 mg total) by mouth 2 (two) times daily before a meal. PLEASE SCHEDULE A YEARLY FOLLOW UP. (Patient not taking: Reported on 11/13/2022)   No current facility-administered medications on file prior to visit.   Medications Discontinued During This Encounter  Medication Reason   sucralfate (CARAFATE) 1 g tablet    ondansetron (ZOFRAN-ODT) 8 MG disintegrating tablet      Objective   Physical Exam  BP 123/83 (BP Location: Left Arm, Patient Position: Sitting)   Pulse 86   Temp 97.6 F (36.4 C) (Temporal)   Ht 5\' 3"  (1.6 m)   Wt 128 lb 12.8 oz (58.4 kg)   SpO2 97%   BMI 22.82 kg/m  Wt Readings from Last 10 Encounters:  11/13/22 128 lb 12.8 oz (58.4 kg)  09/21/22 128 lb (58.1 kg)  04/12/22 133 lb 12.8 oz (60.7 kg)  04/06/22 135 lb (61.2 kg)  03/07/22 138 lb 1.6 oz (62.6 kg)  09/26/21 139 lb (63 kg)  08/18/21 142 lb (64.4 kg)  08/12/21 140 lb 11.2 oz (63.8 kg)  08/01/21 142 lb (64.4 kg)  07/26/21 137 lb (62.1 kg)   Vital signs reviewed.  Nursing notes reviewed. Weight trend reviewed. Abnormalities and Problem-Specific physical exam findings:  there might be very slight short term memory loss, but  she is very calm, pleasant, and intelligent.  General Appearance:  No acute distress appreciable.   Well-groomed, healthy-appearing female.  Well proportioned with no abnormal fat distribution.  Good muscle tone. Pulmonary:  Normal work of breathing at rest, no respiratory distress apparent. SpO2: 97 %  Musculoskeletal: All extremities are intact.  Neurological:  Awake, alert, oriented, and engaged.  No obvious focal neurological deficits or cognitive impairments.  Sensorium seems unclouded.   Speech is clear and coherent with logical content. Psychiatric:  Appropriate mood, pleasant and cooperative demeanor, thoughtful  and engaged during the exam  Results   LABS Vitamin D: normal (April 2024)  RADIOLOGY MRI: bilateral periatrial white matter lesions consistent with multiple sclerosis (2011)        No results found for any visits on 11/13/22.  Appointment on 08/18/2022  Component Date Value   BUN 08/18/2022 18    Hemoglobin 08/18/2022 14.4   Appointment on 07/25/2022  Component Date Value   WBC 07/25/2022 6.9    RBC 07/25/2022 4.47    Hemoglobin 07/25/2022 14.4    HCT 07/25/2022 42.9    MCV 07/25/2022 95.9    MCHC 07/25/2022 33.7    RDW 07/25/2022 14.0    Platelets 07/25/2022 203.0    Neutrophils Relative % 07/25/2022 64.7    Lymphocytes Relative 07/25/2022 22.4    Monocytes Relative 07/25/2022 9.3    Eosinophils Relative 07/25/2022 2.8    Basophils Relative 07/25/2022 0.8    Neutro Abs 07/25/2022 4.5    Lymphs Abs 07/25/2022 1.5    Monocytes Absolute 07/25/2022 0.6    Eosinophils Absolute 07/25/2022 0.2    Basophils Absolute 07/25/2022 0.1    Iron 07/25/2022 113    Transferrin 07/25/2022 320.0    Saturation Ratios 07/25/2022 25.2    Ferritin 07/25/2022 7.6 (L)    TIBC 07/25/2022 448.0   Appointment on 06/14/2022  Component Date Value   VITD 06/14/2022 44.56   Office Visit on 04/13/2022  Component Date Value   Hgb A1c MFr Bld 04/13/2022 5.3    VITD 04/13/2022  22.07 (L)    Vitamin B-12 04/13/2022 611    TSH 04/13/2022 1.28    Cholesterol 04/13/2022 200    Triglycerides 04/13/2022 191.0 (H)    HDL 04/13/2022 55.20    VLDL 04/13/2022 38.2    LDL Cholesterol 04/13/2022 106 (H)    Total CHOL/HDL Ratio 04/13/2022 4    NonHDL 04/13/2022 144.36    Sodium 04/13/2022 142    Potassium 04/13/2022 3.8    Chloride 04/13/2022 107    CO2 04/13/2022 25    Glucose, Bld 04/13/2022 96    BUN 04/13/2022 19    Creatinine, Ser 04/13/2022 1.00    Total Bilirubin 04/13/2022 0.6    Alkaline Phosphatase 04/13/2022 70    AST 04/13/2022 19    ALT 04/13/2022 14    Total Protein 04/13/2022 6.6    Albumin 04/13/2022 4.6    GFR 04/13/2022 60.72    Calcium 04/13/2022 9.4    WBC 04/13/2022 5.8    RBC 04/13/2022 4.32    Hemoglobin 04/13/2022 14.1    HCT 04/13/2022 40.8    MCV 04/13/2022 94.4    MCHC 04/13/2022 34.6    RDW 04/13/2022 12.6    Platelets 04/13/2022 204.0    Neutrophils Relative % 04/13/2022 58.7    Lymphocytes Relative 04/13/2022 28.3    Monocytes Relative 04/13/2022 8.8    Eosinophils Relative 04/13/2022 3.2    Basophils Relative 04/13/2022 1.0    Neutro Abs 04/13/2022 3.4    Lymphs Abs 04/13/2022 1.6    Monocytes Absolute 04/13/2022 0.5    Eosinophils Absolute 04/13/2022 0.2    Basophils Absolute 04/13/2022 0.1    Iron 04/13/2022 99    Transferrin 04/13/2022 319.0    Saturation Ratios 04/13/2022 22.2    Ferritin 04/13/2022 22.3    TIBC 04/13/2022 446.6   Appointment on 04/10/2022  Component Date Value   Iron 04/10/2022 153 (H)    Transferrin 04/10/2022 315.0    Saturation Ratios 04/10/2022 34.7    Ferritin 04/10/2022 21.8  TIBC 04/10/2022 441.0    WBC 04/10/2022 8.1    RBC 04/10/2022 4.46    Hemoglobin 04/10/2022 14.6    HCT 04/10/2022 42.7    MCV 04/10/2022 95.9    MCHC 04/10/2022 34.1    RDW 04/10/2022 13.1    Platelets 04/10/2022 228.0    Neutrophils Relative % 04/10/2022 67.1    Lymphocytes Relative 04/10/2022 22.0     Monocytes Relative 04/10/2022 8.1    Eosinophils Relative 04/10/2022 2.1    Basophils Relative 04/10/2022 0.7    Neutro Abs 04/10/2022 5.4    Lymphs Abs 04/10/2022 1.8    Monocytes Absolute 04/10/2022 0.7    Eosinophils Absolute 04/10/2022 0.2    Basophils Absolute 04/10/2022 0.1   Appointment on 01/24/2022  Component Date Value   Iron 01/24/2022 97    Transferrin 01/24/2022 286.0    Saturation Ratios 01/24/2022 24.2    Ferritin 01/24/2022 14.4    TIBC 01/24/2022 400.4    WBC 01/24/2022 8.1    RBC 01/24/2022 4.39    Hemoglobin 01/24/2022 14.5    HCT 01/24/2022 42.0    MCV 01/24/2022 95.6    MCHC 01/24/2022 34.6    RDW 01/24/2022 13.7    Platelets 01/24/2022 192.0    Neutrophils Relative % 01/24/2022 66.4    Lymphocytes Relative 01/24/2022 22.8    Monocytes Relative 01/24/2022 7.1    Eosinophils Relative 01/24/2022 3.1    Basophils Relative 01/24/2022 0.6    Neutro Abs 01/24/2022 5.4    Lymphs Abs 01/24/2022 1.9    Monocytes Absolute 01/24/2022 0.6    Eosinophils Absolute 01/24/2022 0.2    Basophils Absolute 01/24/2022 0.0    No image results found.   No results found.  No results found.     Attestation:  I have personally spent  53 minutes involved in face-to-face and non-face-to-face activities for this patient on the day of the visit. Professional time spent includes the following activities:  Preparing to see the patient by reviewing medical records during the encounter; Obtaining, documenting, and reviewing an updated medical history; Performing a medically appropriate examination;  Evaluating, synthesizing, and documenting the available clinical information in the EMR;  Coordinating/Communicating with other health care professionals via referral communication comments; , Communicating, counseling, educating about results to the patient; Collaboratively developing and communicating an individualized treatment plan with the patient; Placing medically necessary orders (for  medications/tests/procedures/referrals);   This time was independent of any separately billable procedure(s).  The extended duration of this patient visit was medically necessary due to several factors:  The patient's health condition is multifaceted, requiring a comprehensive evaluation of patient and their past records to ensure accurate diagnosis and treatment planning; Effective patient education and communication, particularly for patients with complex care needs, often require additional time to ensure the patient (or caregivers) fully understand the care plan;  Coordination of care with other healthcare professionals and services depends on thorough documentation, extending both documentation time and visit durations.  All these factors are integral to providing high-quality patient care and ensuring optimal health outcomes.    Additional Info: This encounter employed real-time, collaborative documentation. The patient actively reviewed and updated their medical record on a shared screen, ensuring transparency and facilitating joint problem-solving for the problem list, overview, and plan. This approach promotes accurate, informed care. The treatment plan was discussed and reviewed in detail, including medication safety, potential side effects, and all patient questions. We confirmed understanding and comfort with the plan. Follow-up instructions were established, including contacting the office  for any concerns, returning if symptoms worsen, persist, or new symptoms develop, and precautions for potential emergency department visits.

## 2022-11-13 NOTE — Patient Instructions (Signed)
VISIT SUMMARY:  During your visit, we focused on getting to know your medical problems so we discussed your multiple sclerosis, anxiety, depression, vitamin D and iron deficiencies, hip pain, fibromyalgia, gastrointestinal issues, hearing loss, and sleep issues. You have been managing these conditions with various medications and have reported feeling the best you have in 30 years. We also discussed your recent weight loss and your goal to lose an additional five pounds.  YOUR PLAN:  -MULTIPLE SCLEROSIS: Multiple sclerosis is a disease that affects the brain and spinal cord. We will order an MRI of your brain to check for any changes and refer you to a neurologist for further evaluation and potential treatment.  -DEPRESSION/ANXIETY: Depression and anxiety are mental health disorders that affect your mood and feelings. We will continue your current medication regimen and may consider a slow taper in the future.  We are mainly leaving this to [employer] and we're pleased with the current status quo  -IRON DEFICIENCY: Iron deficiency is a condition where your body lacks enough iron. We will order iron studies to check your current status and advise you to continue your iron supplementation.  -VITAMIN D DEFICIENCY: Vitamin D deficiency means your body does not have enough vitamin D. We advise you to resume your Vitamin D supplementation and will check your Vitamin D levels.  -HIP PAIN: Your hip pain is due to a nerve impingement and previous surgeries. We will continue your current pain management plan.  It sounds like you have a plan to get the other hip improved so we will defer to your specialist at [facility]  -FIBROMYALGIA: Fibromyalgia is a disorder characterized by widespread musculoskeletal pain. We will continue your current medication and discuss the potential use of naltrexone.  It seems like it so well-controlled by the Cymbalta that there is no need to add any additional meds at this  time.  -GASTROINTESTINAL ISSUES: You have reported stomach issues. We will continue your current medications and proceed with your scheduled endoscopy and colonoscopy.  We think Dr. Adela Lank is awesome.  -HEARING LOSS: You have a history of significant hearing loss. We will refer you for an audiology evaluation.  Want to avoid missing any further deterioration.  -SLEEP ISSUES: You have reported poor sleep, possibly related to sinus issues or your multiple sclerosis. We advise you to monitor your sleep and may consider a sleep evaluation.  A sleep diary can be a powerful tool for understanding what is impacting your sleep.  INSTRUCTIONS:  You have a bone density test and mammogram scheduled in the coming weeks, and a wellness visit scheduled for early March. Please ensure to attend these appointments and continue with your current medication regimen. If you have any changes in your symptoms or new concerns, please contact the office.  We are happy to accept you as a patient look forward to partnering with you and your health journey.

## 2022-11-15 ENCOUNTER — Other Ambulatory Visit: Payer: Self-pay | Admitting: Behavioral Health

## 2022-11-15 DIAGNOSIS — F411 Generalized anxiety disorder: Secondary | ICD-10-CM

## 2022-11-15 DIAGNOSIS — F331 Major depressive disorder, recurrent, moderate: Secondary | ICD-10-CM

## 2022-11-27 ENCOUNTER — Ambulatory Visit: Payer: Medicare Other

## 2022-11-27 ENCOUNTER — Encounter (INDEPENDENT_AMBULATORY_CARE_PROVIDER_SITE_OTHER): Payer: Medicare Other | Admitting: Internal Medicine

## 2022-11-27 ENCOUNTER — Other Ambulatory Visit: Payer: Self-pay | Admitting: Behavioral Health

## 2022-11-27 ENCOUNTER — Inpatient Hospital Stay: Admission: RE | Admit: 2022-11-27 | Payer: Medicare Other | Source: Ambulatory Visit

## 2022-11-27 DIAGNOSIS — F331 Major depressive disorder, recurrent, moderate: Secondary | ICD-10-CM

## 2022-11-27 DIAGNOSIS — F411 Generalized anxiety disorder: Secondary | ICD-10-CM

## 2022-11-28 ENCOUNTER — Ambulatory Visit: Payer: Medicare Other | Admitting: Behavioral Health

## 2022-11-28 ENCOUNTER — Other Ambulatory Visit: Payer: Self-pay | Admitting: Behavioral Health

## 2022-11-28 DIAGNOSIS — F40240 Claustrophobia: Secondary | ICD-10-CM | POA: Diagnosis not present

## 2022-11-28 DIAGNOSIS — F900 Attention-deficit hyperactivity disorder, predominantly inattentive type: Secondary | ICD-10-CM

## 2022-11-28 DIAGNOSIS — R454 Irritability and anger: Secondary | ICD-10-CM

## 2022-11-28 DIAGNOSIS — F411 Generalized anxiety disorder: Secondary | ICD-10-CM

## 2022-11-28 DIAGNOSIS — F331 Major depressive disorder, recurrent, moderate: Secondary | ICD-10-CM

## 2022-11-28 MED ORDER — TRAZODONE HCL 50 MG PO TABS: 100.0000 mg | ORAL_TABLET | Freq: Every day | ORAL | 0 refills | Status: DC

## 2022-11-28 NOTE — Telephone Encounter (Signed)
Patient missed her appt 10/8. Please call to reschedule.

## 2022-11-28 NOTE — Telephone Encounter (Signed)
Pt called and needs her trazodone 50 mg to be sent to [facility] on [address]

## 2022-11-28 NOTE — Progress Notes (Signed)
Pt called in and said that she was late getting back from her trip and overslept. No charges will be assessed this visit.

## 2022-11-28 NOTE — Telephone Encounter (Signed)
Patient called to clarify she wants referral faxed to place below:    [facility]  [address], [place], [place] [address] Phone: [phone] Fax: [phone]

## 2022-11-28 NOTE — Telephone Encounter (Signed)
#   MyChart Encounter Documentation Template  ## Encounter Type MyChart secure digital messaging clinical encounter  ## Chief Complaint Patient concerns about MRI scheduling delay, request for open MRI, and medication for claustrophobia related to multiple sclerosis (MS) follow-up.  ## Relevant History - History of multiple sclerosis diagnosis in 2011 with MRI findings consistent with MS. - No definitive treatment due to insurance issues at that time - Currently experiencing eye issues. - Recent discussion (~2 weeks ago) about possibility of MS progression, resulting in MRI order. - Patient receives Xanax from a psychiatrist for anxiety management.  ## Assessment - Established MS patient with potential disease progression, awaiting updated MRI for evaluation. - Patient experiencing anxiety and claustrophobia related to upcoming MRI procedure. - Coordinating care between neurology, radiology, and psychiatry for comprehensive management.  ## Plan 1. Coordinate with referral team to explore earlier MRI scheduling options, including possibility of open MRI. 2. Advised patient to consult with their psychiatrist regarding anxiety medication for the MRI procedure. 3. Scheduled follow-up appointment to discuss current symptoms and address concerns. 4. Maintaining neurologist referral for further evaluation and potential treatment initiation post-MRI. 5. Provided patient education on coping strategies for MRI-related claustrophobia. 6. Instructed patient on symptoms requiring immediate medical attention.  ## Patient Education - Explained importance of timely MRI for MS management. - Provided strategies for managing claustrophobia during MRI: relaxation techniques, music/audiobooks, requesting breaks if needed. - Educated on MS symptoms requiring emergency care: sudden vision changes, severe unilateral weakness, speech difficulties.  ## Follow-up - Awaiting MRI results to guide further  management. - Scheduled follow-up appointment within 2 weeks to review symptoms and address concerns. - Anticipate neurology consultation post-MRI for treatment planning.  ## Medical Decision Making Moderate complexity due to: 1. Established diagnosis of MS with potential progression requiring careful coordination of care. 2. Need to balance timely diagnostic imaging with patient's anxiety management. 3. Coordination between multiple specialties (neurology, radiology, psychiatry) for optimal patient care.  ## Current Medical Guidelines According to the American Academy of Neurology guidelines on MS diagnosis and treatment (2018), "Clinicians should discuss the benefits and risks of disease-modifying therapies for people with a single clinical demyelinating event with 2 or more brain or spinal cord lesions that have imaging characteristics consistent with MS." [Source: https://www.aan.com/Guidelines/home/GetGuidelineContent/900]  ## Closing Statement Please see the MyChart message reply(ies) for my assessment and plan.   This patient gave consent for this Medical Advice Message and is aware that it may result in a bill to [employer], as well as the possibility of receiving a bill for a co-payment or deductible. They are an established patient, but are not seeking medical advice exclusively about a problem treated during an in-person or video visit in the last seven days. I did not recommend an in-person or video visit within seven days of my reply.   I spent a total of 20 minutes cumulative time within 7 days through [employer].  Activities performed during this time include: - Reviewing prior medical records and patient history - Evaluating diagnostic results and reports (if applicable) - Reviewing relevant medical literature and current guidelines - Crafting a contextually relevant, evidence-based, and understandable MyChart message - Updating and documenting the  patient's care plan and encounter summary - Coordinating and guiding follow-up care planning.  Jordan Olszewski, MD

## 2022-11-29 DIAGNOSIS — H52223 Regular astigmatism, bilateral: Secondary | ICD-10-CM | POA: Diagnosis not present

## 2022-11-29 DIAGNOSIS — H353131 Nonexudative age-related macular degeneration, bilateral, early dry stage: Secondary | ICD-10-CM | POA: Diagnosis not present

## 2022-11-29 DIAGNOSIS — H5203 Hypermetropia, bilateral: Secondary | ICD-10-CM | POA: Diagnosis not present

## 2022-11-29 DIAGNOSIS — H40033 Anatomical narrow angle, bilateral: Secondary | ICD-10-CM | POA: Diagnosis not present

## 2022-11-29 DIAGNOSIS — H40013 Open angle with borderline findings, low risk, bilateral: Secondary | ICD-10-CM | POA: Diagnosis not present

## 2022-11-29 DIAGNOSIS — H524 Presbyopia: Secondary | ICD-10-CM | POA: Diagnosis not present

## 2022-11-29 DIAGNOSIS — H2513 Age-related nuclear cataract, bilateral: Secondary | ICD-10-CM | POA: Diagnosis not present

## 2022-11-30 ENCOUNTER — Ambulatory Visit: Payer: Medicare Other | Admitting: Neurology

## 2022-12-01 NOTE — Addendum Note (Signed)
Addended by: Donzetta Starch on: 12/01/2022 09:58 AM   Modules accepted: Orders

## 2022-12-04 ENCOUNTER — Other Ambulatory Visit: Payer: Medicare Other

## 2022-12-07 ENCOUNTER — Other Ambulatory Visit: Payer: Self-pay | Admitting: Behavioral Health

## 2022-12-07 DIAGNOSIS — R454 Irritability and anger: Secondary | ICD-10-CM

## 2022-12-07 DIAGNOSIS — F411 Generalized anxiety disorder: Secondary | ICD-10-CM

## 2022-12-08 ENCOUNTER — Encounter: Payer: Medicare Other | Admitting: Gastroenterology

## 2022-12-08 ENCOUNTER — Telehealth: Payer: Self-pay | Admitting: Gastroenterology

## 2022-12-08 ENCOUNTER — Encounter: Payer: Self-pay | Admitting: Internal Medicine

## 2022-12-08 NOTE — Telephone Encounter (Signed)
Inbound call from patient requesting updated  prep instructions for rescheduled colonoscopy for 11/14. States it is okay to send through [place]. Please advise, thank you.

## 2022-12-13 ENCOUNTER — Encounter: Payer: Self-pay | Admitting: Gastroenterology

## 2022-12-13 DIAGNOSIS — D509 Iron deficiency anemia, unspecified: Secondary | ICD-10-CM

## 2022-12-14 ENCOUNTER — Ambulatory Visit: Payer: Medicare Other | Admitting: Neurology

## 2022-12-15 NOTE — Telephone Encounter (Signed)
Lab orders entered for CBC, IBC, ferritin.

## 2022-12-19 ENCOUNTER — Encounter: Payer: Self-pay | Admitting: Behavioral Health

## 2022-12-19 ENCOUNTER — Ambulatory Visit: Payer: Medicare Other | Admitting: Behavioral Health

## 2022-12-19 DIAGNOSIS — F900 Attention-deficit hyperactivity disorder, predominantly inattentive type: Secondary | ICD-10-CM

## 2022-12-19 DIAGNOSIS — F411 Generalized anxiety disorder: Secondary | ICD-10-CM

## 2022-12-19 DIAGNOSIS — F331 Major depressive disorder, recurrent, moderate: Secondary | ICD-10-CM

## 2022-12-19 NOTE — Progress Notes (Signed)
[facility]  Patient ID: Tonantzin Assante,  MRN: [id number]  PCP: [name], MD  Date of Evaluation: 12/19/2022 Time spent:30 minutes  Chief Complaint:  Chief Complaint   Anxiety; Depression; Follow-up; Medication Refill; Patient Education     HISTORY/CURRENT STATUS: HPI  62 year old patient presents to this office face to face visit for  follow up and medication management. She says she is "doing ok" this visit. She will be leaving for [facility] this November for three months because she would like a change of pace. She has friends living there.  Says her depression today is 2/10 and anxiety 2/10. She is sleeping 7-8 hours per night. She denies any current or prior mania.No psychosis, no auditory or visual hallucinations. Strong family hx of dementia. No SI or HI.   Prior psychiatric medication trials:   Abilify Latuda Risperdone Amitriptyline Trintellix Lexapro Prozac Zoloft Paxil Wellbutrin Remeron Effexor Cymbalta Rexulti   Individual Medical History/ Review of Systems: Changes? :No   Allergies: Gabapentin, Mirtazapine, Prednisone, and Pregabalin  Current Medications:  Current Outpatient Medications:    ALPRAZolam (XANAX) 0.5 MG tablet, TAKE ONE TABLET BY MOUTH TWICE A DAY AS NEEDED FOR ANXIETY, Disp: 60 tablet, Rfl: 0   busPIRone (BUSPAR) 30 MG tablet, TAKE 1 TABLET BY MOUTH 2 TIMES A DAY, Disp: 60 tablet, Rfl: 0   Cholecalciferol (VITAMIN D3) 125 MCG (5000 UT) CAPS, Take 1 capsule (5,000 Units total) by mouth daily., Disp: 90 capsule, Rfl: 3   dicyclomine (BENTYL) 10 MG capsule, TAKE ONE CAPSULE BY MOUTH EVERY 6 HOURS AS NEEDED FOR ABDOMINAL PAIN, Disp: 30 capsule, Rfl: 1   DULoxetine (CYMBALTA) 60 MG capsule, TAKE 1 CAPSULE BY MOUTH TWICE  DAILY, Disp: 200 capsule, Rfl: 0   esomeprazole (NEXIUM) 40 MG capsule, Take 1 capsule (40 mg total) by mouth 2 (two) times daily before a meal. PLEASE SCHEDULE A YEARLY FOLLOW UP. (Patient not  taking: Reported on 11/13/2022), Disp: 180 capsule, Rfl: 3   fluticasone (FLONASE) 50 MCG/ACT nasal spray, SPRAY 1 SPRAY IN EACH NOSTRIL ONCE DAILY AS NEEDED FOR ALLERGIES OR RHINITIS, Disp: 16 mL, Rfl: 2   lamoTRIgine (LAMICTAL) 25 MG tablet, TAKE 1 TABLET BY MOUTH 2 TIMES A DAY, Disp: 60 tablet, Rfl: 0   ondansetron (ZOFRAN-ODT) 4 MG disintegrating tablet, , Disp: , Rfl:    tiZANidine (ZANAFLEX) 2 MG tablet, TAKE 1 TABLET BY MOUTH 3 TIMES A DAY AS NEEDED FOR PAIN/SPASM, Disp: , Rfl:    traZODone (DESYREL) 50 MG tablet, Take 2 tablets (100 mg total) by mouth at bedtime., Disp: 60 tablet, Rfl: 0 Medication Side Effects: none  Family Medical/ Social History: Changes? No  MENTAL HEALTH EXAM:  There were no vitals taken for this visit.There is no height or weight on file to calculate BMI.  General Appearance: Casual and Well Groomed  Eye Contact:  Good  Speech:  Clear and Coherent  Volume:  Normal  Mood:  NA  Affect:  Appropriate  Thought Process:  Coherent  Orientation:  Full (Time, Place, and Person)  Thought Content: Logical   Suicidal Thoughts:  No  Homicidal Thoughts:  No  Memory:  WNL  Judgement:  Good  Insight:  Good  Psychomotor Activity:  Normal  Concentration:  Concentration: Good  Recall:  Good  Fund of Knowledge: Good  Language: Good  Assets:  Desire for Improvement  ADL's:  Intact  Cognition: WNL  Prognosis:  Good    DIAGNOSES:    ICD-10-CM  1. Generalized anxiety disorder  F41.1     2. Major depressive disorder, recurrent episode, moderate (HCC)  F33.1     3. Attention deficit hyperactivity disorder (ADHD), predominantly inattentive type  F90.0       Receiving Psychotherapy: No    RECOMMENDATIONS:   Greater than 50% of 30 face to face time with patient was spent on counseling and coordination of care. We discussed her current stability. Irritability has improved greatly. Talked about her upcoming trip to [place]. Request no changes to medication this  visit.   We agreed to:  Will continue Lamictal 50 mg daily Will continue Cymbalta 60 mg twice daily To continue Trazodone 50 mg at bedtime as reported by pt Continue Buspar to 30 mg twice daily, at least 7-8 hours between doses. Continue Xanax 0.5 mg twice daily for severe anxiety only.  To report worsening symptoms or side effects promptly Will follow up in 4 months to reassess Provided emergency contact information Discussed potential metabolic side effects associated with atypical antipsychotics, as well as potential risk for movement side effects. Advised pt to contact office if movement side effects occur.   Reviewed PDMP   Joan Flores, NP

## 2022-12-20 ENCOUNTER — Telehealth: Payer: Self-pay

## 2022-12-20 ENCOUNTER — Other Ambulatory Visit (INDEPENDENT_AMBULATORY_CARE_PROVIDER_SITE_OTHER): Payer: Medicare Other

## 2022-12-20 DIAGNOSIS — D509 Iron deficiency anemia, unspecified: Secondary | ICD-10-CM

## 2022-12-20 DIAGNOSIS — Z79899 Other long term (current) drug therapy: Secondary | ICD-10-CM | POA: Diagnosis not present

## 2022-12-20 DIAGNOSIS — K219 Gastro-esophageal reflux disease without esophagitis: Secondary | ICD-10-CM

## 2022-12-20 LAB — IBC + FERRITIN
Ferritin: 22.1 ng/mL (ref 10.0–291.0)
Iron: 69 ug/dL (ref 42–145)
Saturation Ratios: 18 % — ABNORMAL LOW (ref 20.0–50.0)
TIBC: 383.6 ug/dL (ref 250.0–450.0)
Transferrin: 274 mg/dL (ref 212.0–360.0)

## 2022-12-20 LAB — CBC WITH DIFFERENTIAL/PLATELET
Basophils Absolute: 0.1 10*3/uL (ref 0.0–0.1)
Basophils Relative: 0.9 % (ref 0.0–3.0)
Eosinophils Absolute: 0.3 10*3/uL (ref 0.0–0.7)
Eosinophils Relative: 3.1 % (ref 0.0–5.0)
HCT: 41.9 % (ref 36.0–46.0)
Hemoglobin: 14.2 g/dL (ref 12.0–15.0)
Lymphocytes Relative: 18.3 % (ref 12.0–46.0)
Lymphs Abs: 1.6 10*3/uL (ref 0.7–4.0)
MCHC: 34 g/dL (ref 30.0–36.0)
MCV: 96.9 fL (ref 78.0–100.0)
Monocytes Absolute: 0.7 10*3/uL (ref 0.1–1.0)
Monocytes Relative: 7.3 % (ref 3.0–12.0)
Neutro Abs: 6.3 10*3/uL (ref 1.4–7.7)
Neutrophils Relative %: 70.4 % (ref 43.0–77.0)
Platelets: 216 10*3/uL (ref 150.0–400.0)
RBC: 4.32 Mil/uL (ref 3.87–5.11)
RDW: 12.9 % (ref 11.5–15.5)
WBC: 8.9 10*3/uL (ref 4.0–10.5)

## 2022-12-20 NOTE — Telephone Encounter (Signed)
-----   Message from [place] [facility] Marylu Lund H sent at 09/21/2022  3:45 PM EDT ----- Regarding: due for labs in Nov Patient will be due for cbc and ibc/ferritin in early Nov

## 2022-12-20 NOTE — Telephone Encounter (Signed)
MyChart message to patient to go to lab

## 2022-12-30 ENCOUNTER — Other Ambulatory Visit: Payer: Self-pay | Admitting: Behavioral Health

## 2022-12-30 DIAGNOSIS — F411 Generalized anxiety disorder: Secondary | ICD-10-CM

## 2022-12-30 DIAGNOSIS — F900 Attention-deficit hyperactivity disorder, predominantly inattentive type: Secondary | ICD-10-CM

## 2022-12-30 DIAGNOSIS — F331 Major depressive disorder, recurrent, moderate: Secondary | ICD-10-CM

## 2022-12-30 DIAGNOSIS — R454 Irritability and anger: Secondary | ICD-10-CM

## 2022-12-31 ENCOUNTER — Encounter: Payer: Self-pay | Admitting: Certified Registered Nurse Anesthetist

## 2022-12-31 NOTE — Telephone Encounter (Signed)
Is she taking 50 or 100?  Last note says 50.

## 2023-01-04 ENCOUNTER — Encounter: Payer: Self-pay | Admitting: Gastroenterology

## 2023-01-04 ENCOUNTER — Other Ambulatory Visit: Payer: Medicare Other

## 2023-01-04 ENCOUNTER — Ambulatory Visit: Payer: Medicare Other | Admitting: Gastroenterology

## 2023-01-04 VITALS — BP 119/64 | HR 81 | Temp 97.0°F | Resp 12 | Ht 63.0 in | Wt 128.0 lb

## 2023-01-04 DIAGNOSIS — D509 Iron deficiency anemia, unspecified: Secondary | ICD-10-CM | POA: Diagnosis not present

## 2023-01-04 DIAGNOSIS — J45909 Unspecified asthma, uncomplicated: Secondary | ICD-10-CM | POA: Diagnosis not present

## 2023-01-04 MED ORDER — SODIUM CHLORIDE 0.9 % IV SOLN: 500.0000 mL | Freq: Once | INTRAVENOUS | Status: DC

## 2023-01-04 NOTE — Progress Notes (Signed)
Report given to PACU, vss 

## 2023-01-04 NOTE — Progress Notes (Signed)
[facility] History and Physical   Primary Care Physician:  [name], MD   Reason for Procedure:   Iron deficiency  Plan:    colonoscopy     HPI: Jordan [name] is a 62 y.o. female  here for colonoscopy surveillance. History of recurrent IDA. EGD UTD. Anemia resolved but recurrent iron deficiency despite taking iron. Last colonoscopy 04/2019.  Patient denies any bowel symptoms at this time. No family history of colon cancer known. Otherwise feels well without any cardiopulmonary symptoms.   I have discussed risks / benefits of anesthesia and endoscopic procedure with [name] and they wish to proceed with the exams as outlined today.    Past Medical History:  Diagnosis Date   Acute upper respiratory infections of unspecified site    ADD (attention deficit disorder)    Allergic rhinitis due to pollen    Anal fissure    Anemia, unspecified    Anxiety disorder    Asthma    Carpal tunnel syndrome 04/25/2007   Qualifier: Diagnosis of   By: Debby Bud MD, Rosalyn Gess        Chicken pox    Complication of anesthesia    wakes up slow   Cough 07/03/2011   Followed in Pulmonary clinic/ [facility]/ [facility]      - Spirometry nl during flare 07/03/2011      Had severe asthmatic bronchitis vs pneumonia requiring hospitalization April '13 for severe bronchospasm. Subsequently had persistent cough and bronchospasm for several months.      Depression, recurrent (HCC) 12/05/2006   Qualifier: Diagnosis of   By: Roxan Hockey CMA, Jessica      History or severe depression - followed by Dr. Evelene Croon  Additional diagnosis: ADD, agraphobia, self-mutilation.  Med added - abilify     Depressive disorder, not elsewhere classified    Diffuse cystic mastopathy    Falls    Fibromyalgia    GERD (gastroesophageal reflux disease)    Headache(784.0)    History of anorexia nervosa    History of bulimia    IBS (irritable bowel syndrome)    IGT (impaired glucose  tolerance) 03/24/2021   Lab Results      Component    Value    Date           HGBA1C    5.3    04/13/2022           Incontinence without sensory awareness 06/01/2009   Qualifier: Diagnosis of   By: Debby Bud MD, Rosalyn Gess        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    Iron deficiency anemia 03/24/2021   Lump or mass in breast    Meningitis, unspecified(322.9)    NASAL POLYP 05/14/2009   NECK PAIN, CHRONIC 02/19/2007   Qualifier: Diagnosis of   By: Debby Bud MD, Rosalyn Gess        Oral thrush    Other specified glaucoma    Perforation of tympanic membrane, unspecified    Radial styloid tenosynovitis    Seizures (HCC)    TYMPANIC MEMBRANE PERFORATION 12/05/2006   Annotation: REPAIRED  Qualifier: Diagnosis of   By: Roxan Hockey CMA, Jessica      Replacing diagnoses that were inactivated after the 05/22/22 regulatory import      Past Surgical History:  Procedure Laterality Date   ADENOIDECTOMY     ANKLE SURGERY  1992   right ankle - scar tissue   DILATION AND CURETTAGE OF UTERUS  2006   History  of Menometrorrhagia   INNER EAR SURGERY  1966 to 1986   6 surgeries on right ear   LAPAROSCOPY  1993   normal   REDUCTION MAMMAPLASTY Bilateral 2018   uterine ablation  2011   WRIST SURGERY  2001   for de-quervaine - bilateral wrists - tendonitis    Prior to Admission medications   Medication Sig Start Date End Date Taking? Authorizing Provider  ALPRAZolam Prudy Feeler) 0.5 MG tablet TAKE ONE TABLET BY MOUTH TWICE A DAY AS NEEDED FOR ANXIETY 12/08/22  Yes White, Brian A, NP  busPIRone (BUSPAR) 30 MG tablet TAKE 1 TABLET BY MOUTH 2 TIMES A DAY 11/15/22  Yes White, Brian A, NP  Cholecalciferol (VITAMIN D3) 125 MCG (5000 UT) CAPS Take 1 capsule (5,000 Units total) by mouth daily. 11/13/22  Yes [name], MD  DULoxetine (CYMBALTA) 60 MG capsule TAKE 1 CAPSULE BY MOUTH TWICE  DAILY 11/28/22  Yes Avelina Laine A, NP  esomeprazole (NEXIUM) 40 MG capsule Take 1 capsule (40 mg total) by mouth 2 (two) times daily before a  meal. PLEASE SCHEDULE A YEARLY FOLLOW UP. 07/12/22  Yes Abdelrahman Nair, [name], MD  lamoTRIgine (LAMICTAL) 25 MG tablet TAKE 1 TABLET BY MOUTH 2 TIMES A DAY 01/01/23  Yes Avelina Laine A, NP  traZODone (DESYREL) 50 MG tablet TAKE TWO TABLETS BY MOUTH AT BEDTIME 01/01/23  Yes White, Brian A, NP  dicyclomine (BENTYL) 10 MG capsule TAKE ONE CAPSULE BY MOUTH EVERY 6 HOURS AS NEEDED FOR ABDOMINAL PAIN 05/29/22   Qaadir Kent, [name], MD  fluticasone (FLONASE) 50 MCG/ACT nasal spray SPRAY 1 SPRAY IN EACH NOSTRIL ONCE DAILY AS NEEDED FOR ALLERGIES OR RHINITIS 05/29/22   Philip Aspen, Limmie Patricia, MD  ondansetron (ZOFRAN-ODT) 4 MG disintegrating tablet     [provider]  tiZANidine (ZANAFLEX) 2 MG tablet TAKE 1 TABLET BY MOUTH 3 TIMES A DAY AS NEEDED FOR PAIN/SPASM 04/24/22   [provider]    Current Outpatient Medications  Medication Sig Dispense Refill   ALPRAZolam (XANAX) 0.5 MG tablet TAKE ONE TABLET BY MOUTH TWICE A DAY AS NEEDED FOR ANXIETY 60 tablet 0   busPIRone (BUSPAR) 30 MG tablet TAKE 1 TABLET BY MOUTH 2 TIMES A DAY 60 tablet 0   Cholecalciferol (VITAMIN D3) 125 MCG (5000 UT) CAPS Take 1 capsule (5,000 Units total) by mouth daily. 90 capsule 3   DULoxetine (CYMBALTA) 60 MG capsule TAKE 1 CAPSULE BY MOUTH TWICE  DAILY 200 capsule 0   esomeprazole (NEXIUM) 40 MG capsule Take 1 capsule (40 mg total) by mouth 2 (two) times daily before a meal. PLEASE SCHEDULE A YEARLY FOLLOW UP. 180 capsule 3   lamoTRIgine (LAMICTAL) 25 MG tablet TAKE 1 TABLET BY MOUTH 2 TIMES A DAY 60 tablet 2   traZODone (DESYREL) 50 MG tablet TAKE TWO TABLETS BY MOUTH AT BEDTIME 60 tablet 2   dicyclomine (BENTYL) 10 MG capsule TAKE ONE CAPSULE BY MOUTH EVERY 6 HOURS AS NEEDED FOR ABDOMINAL PAIN 30 capsule 1   fluticasone (FLONASE) 50 MCG/ACT nasal spray SPRAY 1 SPRAY IN EACH NOSTRIL ONCE DAILY AS NEEDED FOR ALLERGIES OR RHINITIS 16 mL 2   ondansetron (ZOFRAN-ODT) 4 MG disintegrating tablet      tiZANidine  (ZANAFLEX) 2 MG tablet TAKE 1 TABLET BY MOUTH 3 TIMES A DAY AS NEEDED FOR PAIN/SPASM     Current Facility-Administered Medications  Medication Dose Route Frequency Provider Last Rate Last Admin   0.9 %  sodium chloride infusion  500 mL Intravenous Once Zubair Lofton, [name], MD        Allergies as of 01/04/2023 - Review Complete 01/04/2023  Allergen Reaction Noted   Gabapentin Other (See Comments) 09/14/2022   Mirtazapine Other (See Comments) 04/02/2015   Prednisone Rash 04/19/2020   Pregabalin Other (See Comments) 05/29/2016    Family History  Problem Relation Age of Onset   Dementia Mother    Alcohol abuse Mother    Ovarian cancer Mother    Heart disease Mother    Hypertension Mother    Coronary artery disease Mother    Colon polyps Mother 40   Heart failure Father    Hypertension Father    Hyperlipidemia Father    Diabetes Father    Alcohol abuse Sister    Anxiety disorder Maternal Aunt    Depression Maternal Aunt    Alcohol abuse Maternal Aunt    Alcohol abuse Maternal Uncle    Alcohol abuse Maternal Grandfather    Breast cancer Maternal Grandmother    Colon cancer Neg Hx    Esophageal cancer Neg Hx    Stomach cancer Neg Hx    Rectal cancer Neg Hx     Social History   Socioeconomic History   Marital status: Divorced    Spouse name: Not on file   Number of children: 0   Years of education: Masters   Highest education level: Master's degree (e.g., MA, MS, MEng, MEd, MSW, MBA)  Occupational History   Occupation: part time Airline pilot   Occupation: disability  Tobacco Use   Smoking status: Never   Smokeless tobacco: Never  Vaping Use   Vaping status: Never Used  Substance and Sexual Activity   Alcohol use: Yes    Alcohol/week: 7.0 standard drinks of alcohol    Types: 7 Glasses of wine per week    Comment: 2 glasses of wine per night   Drug use: No   Sexual activity: Not Currently  Other Topics Concern   Not on file  Social History Narrative   HS[employer],  [employer], App for MA. 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 Married 5 years - divorced, no children   Patient is right handed   Patient drinks 1 cup of coffee daily.               Social Determinants of Health   Financial Resource Strain: Low Risk  (04/12/2022)   Overall Financial Resource Strain (CARDIA)    Difficulty of Paying Living Expenses: Not hard at all  Food Insecurity: Food Insecurity Present (04/12/2022)   Hunger Vital Sign    Worried About Running Out of Food in the Last Year: Sometimes true    Ran Out of Food in the Last Year: Sometimes true  Transportation Needs: No Transportation Needs (04/12/2022)   PRAPARE - Administrator, Civil Service (Medical): No    Lack of Transportation (Non-Medical): No  Physical Activity: Sufficiently Active (04/12/2022)   Exercise Vital Sign    Days of Exercise per Week: 7 days    Minutes of Exercise per Session: 40 min  Stress: Stress Concern Present (04/12/2022)   [employer] of Occupational Health - Occupational Stress Questionnaire    Feeling of Stress : Rather much  Social Connections: Moderately Isolated (04/12/2022)   Social Connection and Isolation Panel [NHANES]    Frequency of Communication with Friends and Family: Twice a  week    Frequency of Social Gatherings with Friends and Family: Once a week    Attends Religious Services: Never    Database administrator or Organizations: Yes    Attends Banker Meetings: Never    Marital Status: Divorced  Catering manager Violence: Not At Risk (04/12/2022)   Humiliation, Afraid, Rape, and Kick questionnaire    Fear of Current or Ex-Partner: No    Emotionally Abused: No    Physically Abused: No    Sexually Abused: No    Review of Systems: All other review of systems negative except as mentioned in the HPI.  Physical Exam: Vital signs BP 130/68   Pulse 88   Temp (!)  97 F (36.1 C)   Ht 5\' 3"  (1.6 m)   Wt 128 lb (58.1 kg)   SpO2 96%   BMI 22.67 kg/m   General:   Alert,  Well-developed, pleasant and cooperative in NAD Lungs:  Clear throughout to auscultation.   Heart:  Regular rate and rhythm Abdomen:  Soft, nontender and nondistended.   Neuro/Psych:  Alert and cooperative. Normal mood and affect. A and O x 3  Harlin Rain, MD [facility]

## 2023-01-04 NOTE — Op Note (Signed)
[facility] Patient Name: Jordan [name] Procedure Date: 01/04/2023 1:49 PM MRN: [id number] Endoscopist: Viviann Spare P. Adela Lank , MD, [id number] Age: 62 Referring MD:  Date of Birth: [birth year] Gender: Female Account #: [id number] Procedure:                Colonoscopy Indications:              Iron deficiency anemia - recurrent while on iron,                            has since resolved. EGD done 2023 Medicines:                Monitored Anesthesia Care Procedure:                Pre-Anesthesia Assessment:                           - Prior to the procedure, a History and Physical                            was performed, and patient medications and                            allergies were reviewed. The patient's tolerance of                            previous anesthesia was also reviewed. The risks                            and benefits of the procedure and the sedation                            options and risks were discussed with the patient.                            All questions were answered, and informed consent                            was obtained. Prior Anticoagulants: The patient has                            taken no anticoagulant or antiplatelet agents. ASA                            Grade Assessment: III - A patient with severe                            systemic disease. After reviewing the risks and                            benefits, the patient was deemed in satisfactory                            condition to undergo the procedure.  After obtaining informed consent, the colonoscope                            was passed under direct vision. Throughout the                            procedure, the patient's blood pressure, pulse, and                            oxygen saturations were monitored continuously. The                            Olympus Scope [id number] was introduced through the                            anus and  advanced to the the terminal ileum, with                            identification of the appendiceal orifice and IC                            valve. The colonoscopy was performed without                            difficulty. The patient tolerated the procedure                            well. The quality of the bowel preparation was                            good. The terminal ileum, ileocecal valve,                            appendiceal orifice, and rectum were photographed. Scope In: 1:53:13 PM Scope Out: 2:07:18 PM Scope Withdrawal Time: 0 hours 9 minutes 53 seconds  Total Procedure Duration: 0 hours 14 minutes 5 seconds  Findings:                 The perianal and digital rectal examinations were                            normal.                           The terminal ileum appeared normal.                           A single small angiodysplastic lesion was found in                            the transverse colon.                           Multiple small-mouthed diverticula were found in  the sigmoid colon.                           Internal hemorrhoids were found during                            retroflexion. The hemorrhoids were small.                           Anal papilla(e) were hypertrophied.                           The exam was otherwise without abnormality. Complications:            No immediate complications. Estimated blood loss:                            None. Estimated Blood Loss:     Estimated blood loss: none. Impression:               - The examined portion of the ileum was normal.                           - A single colonic angiodysplastic lesion.                           - Diverticulosis in the sigmoid colon.                           - Internal hemorrhoids.                           - Anal papilla(e) were hypertrophied.                           - The examination was otherwise normal.                           - No polyps.                            No cause for iron deficiency on colonoscopy. Recommendation:           - Patient has a contact number available for                            emergencies. The signs and symptoms of potential                            delayed complications were discussed with the                            patient. Return to normal activities tomorrow.                            Written discharge instructions were provided to the  patient.                           - Resume previous diet.                           - Continue present medications.                           - Repeat colonoscopy in 10 years for screening                            purposes (one small adenoma removed 04/2019)                           - Trend labs - consideration for capsule endoscopy                            pending her course [name]. [name], MD 01/04/2023 2:14:20 PM This report has been signed electronically.

## 2023-01-04 NOTE — Patient Instructions (Signed)
 Resume all of your previous medications today as ordered.  Read your discharge instructions.  YOU HAD AN ENDOSCOPIC PROCEDURE TODAY AT THE [facility]:   Refer to the procedure report that was given to you for any specific questions about what was found during the examination.  If the procedure report does not answer your questions, please call your gastroenterologist to clarify.  If you requested that your care partner not be given the details of your procedure findings, then the procedure report has been included in a sealed envelope for you to review at your convenience later.  YOU SHOULD EXPECT: Some feelings of bloating in the abdomen. Passage of more gas than usual.  Walking can help get rid of the air that was put into your GI tract during the procedure and reduce the bloating. If you had a lower endoscopy (such as a colonoscopy or flexible sigmoidoscopy) you may notice spotting of blood in your stool or on the toilet paper. If you underwent a bowel prep for your procedure, you may not have a normal bowel movement for a few days.  Please Note:  You might notice some irritation and congestion in your nose or some drainage.  This is from the oxygen used during your procedure.  There is no need for concern and it should clear up in a day or so.  SYMPTOMS TO REPORT IMMEDIATELY:  Following lower endoscopy (colonoscopy or flexible sigmoidoscopy):  Excessive amounts of blood in the stool  Significant tenderness or worsening of abdominal pains  Swelling of the abdomen that is new, acute  Fever of 100F or higher  For urgent or emergent issues, a gastroenterologist can be reached at any hour by calling [phone]. Do not use MyChart messaging for urgent concerns.    DIET:  We do recommend a small meal at first, but then you may proceed to your regular diet.  Drink plenty of fluids but you should avoid alcoholic beverages for 24 hours.  ACTIVITY:  You should plan to take it easy  for the rest of today and you should NOT DRIVE or use heavy machinery until tomorrow (because of the sedation medicines used during the test).    FOLLOW UP: Our staff will call the number listed on your records the next business day following your procedure.  We will call around 7:15- 8:00 am to check on you and address any questions or concerns that you may have regarding the information given to you following your procedure. If we do not reach you, we will leave a message.     If any biopsies were taken you will be contacted by phone or by letter within the next 1-3 weeks.  Please call us at [phone] if you have not heard about the biopsies in 3 weeks.    SIGNATURES/CONFIDENTIALITY: You and/or your care partner have signed paperwork which will be entered into your electronic medical record.  These signatures attest to the fact that that the information above on your After Visit Summary has been reviewed and is understood.  Full responsibility of the confidentiality of this discharge information lies with you and/or your care-partner.

## 2023-01-05 ENCOUNTER — Encounter: Payer: Self-pay | Admitting: Gastroenterology

## 2023-01-05 ENCOUNTER — Telehealth: Payer: Self-pay

## 2023-01-05 ENCOUNTER — Encounter: Payer: Self-pay | Admitting: Internal Medicine

## 2023-01-05 DIAGNOSIS — R933 Abnormal findings on diagnostic imaging of other parts of digestive tract: Secondary | ICD-10-CM | POA: Insufficient documentation

## 2023-01-05 NOTE — Telephone Encounter (Signed)
Attempted to reach patient for post-procedure f/u call. No answer. Left message for her to please not hesitate to call if she has any questions/concerns regarding her care. 

## 2023-01-07 ENCOUNTER — Other Ambulatory Visit: Payer: Self-pay | Admitting: Behavioral Health

## 2023-01-07 DIAGNOSIS — F411 Generalized anxiety disorder: Secondary | ICD-10-CM

## 2023-01-07 DIAGNOSIS — R454 Irritability and anger: Secondary | ICD-10-CM

## 2023-02-04 ENCOUNTER — Other Ambulatory Visit: Payer: Self-pay | Admitting: Behavioral Health

## 2023-02-04 DIAGNOSIS — F411 Generalized anxiety disorder: Secondary | ICD-10-CM

## 2023-02-04 DIAGNOSIS — F331 Major depressive disorder, recurrent, moderate: Secondary | ICD-10-CM

## 2023-02-06 ENCOUNTER — Other Ambulatory Visit: Payer: Self-pay | Admitting: Behavioral Health

## 2023-02-06 DIAGNOSIS — F411 Generalized anxiety disorder: Secondary | ICD-10-CM

## 2023-02-06 DIAGNOSIS — F331 Major depressive disorder, recurrent, moderate: Secondary | ICD-10-CM

## 2023-03-12 ENCOUNTER — Encounter: Payer: Self-pay | Admitting: Internal Medicine

## 2023-03-16 ENCOUNTER — Other Ambulatory Visit: Payer: Self-pay | Admitting: Gastroenterology

## 2023-03-20 ENCOUNTER — Encounter: Payer: Self-pay | Admitting: Family Medicine

## 2023-03-20 ENCOUNTER — Ambulatory Visit (INDEPENDENT_AMBULATORY_CARE_PROVIDER_SITE_OTHER): Payer: Medicare Other | Admitting: Family Medicine

## 2023-03-20 VITALS — BP 145/77 | HR 85 | Temp 97.0°F | Resp 16 | Ht 63.0 in | Wt 126.0 lb

## 2023-03-20 DIAGNOSIS — J40 Bronchitis, not specified as acute or chronic: Secondary | ICD-10-CM | POA: Diagnosis not present

## 2023-03-20 MED ORDER — AZITHROMYCIN 250 MG PO TABS: ORAL_TABLET | ORAL | 0 refills | Status: AC

## 2023-03-20 NOTE — Patient Instructions (Signed)
Let me know changes, etc.  Zpack sent to pharmacy

## 2023-03-20 NOTE — Progress Notes (Signed)
Subjective:     Patient ID: Jordan [name], female    DOB: January 27, 1961, 63 y.o.   MRN: [id number]  Chief Complaint  Patient presents with   Cough    Productive cough at times Sx started about 1 week and 1/2 ago, neg Covid test   Headache   Nasal Drainage    Clear nasal drainage   Ear Pain    Right ear pain   Diarrhea    HPI Discussed the use of AI scribe software for clinical note transcription with the patient, who gave verbal consent to proceed.  History of Present Illness   The patient presents with worsening symptoms of upper respiratory infection and eye discomfort. They were referred by Dr. Jon Billings for an ENT evaluation.  Symptoms have been present for a week and a half, initially suspected as an ear infection due to a history of frequent ear infections. Symptoms have progressively worsened, including coughing, headache, runny nose, congestion, right ear pain, diarrhea, and significant eye discomfort described as dryness, pain, and blurriness. Dizziness occurred twice, along with facial flushing and low-grade fever. No shortness of breath is reported.  Significant eye discomfort is described as dryness, pain, and blurriness. They have been using artificial tears to alleviate the dryness.  They have a history of chronic ear infections with persistent drainage, which worsens in the winter. They have not taken antibiotics in years but recall a previous episode that lasted three weeks, during which they eventually took antibiotics. They have an upcoming appointment with an ear doctor.  They have taken Mucinex, which provided some relief for sinus congestion but not for other symptoms. They mention using Mucinex DM, which contains guaifenesin and a cough suppressant, and have found that guaifenesin helps with ear symptoms. They are unsure about the specific components of Mucinex but note that the prescription version is not as high a dose as the over-the-counter  version.  They have lost 32 pounds over the last year by monitoring their diet and using a fitness tracker. They also mention a previous fall from a ladder while loading a kayak, which she is sch for surgery in April.       Health Maintenance Due  Topic Date Due   Medicare Annual Wellness (AWV)  04/14/2023    Past Medical History:  Diagnosis Date   Acute upper respiratory infections of unspecified site    ADD (attention deficit disorder)    Allergic rhinitis due to pollen    Anal fissure    Anemia, unspecified    Anxiety disorder    Asthma    Carpal tunnel syndrome 04/25/2007   Qualifier: Diagnosis of   By: Debby Bud MD, Rosalyn Gess        Chicken pox    Complication of anesthesia    wakes up slow   Cough 07/03/2011   Followed in Pulmonary clinic/ [facility]/ [place]      - Spirometry nl during flare 07/03/2011      Had severe asthmatic bronchitis vs pneumonia requiring hospitalization April '13 for severe bronchospasm. Subsequently had persistent cough and bronchospasm for several months.      Depression, recurrent (HCC) 12/05/2006   Qualifier: Diagnosis of   By: Roxan Hockey CMA, Jessica      History or severe depression - followed by Dr. Evelene Croon  Additional diagnosis: ADD, agraphobia, self-mutilation.  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    Iron deficiency anemia 03/24/2021   Lump or mass in breast    Meningitis, unspecified(322.9)    NASAL POLYP 05/14/2009   NECK PAIN,  CHRONIC 02/19/2007   Qualifier: Diagnosis of   By: Debby Bud MD, Rosalyn Gess        Oral thrush    Other specified glaucoma    Perforation of tympanic membrane, unspecified    Radial styloid tenosynovitis    Seizures (HCC)    TYMPANIC MEMBRANE PERFORATION 12/05/2006   Annotation: REPAIRED  Qualifier: Diagnosis of   By: Roxan Hockey CMA, Jessica      Replacing diagnoses that were inactivated after the 05/22/22 regulatory import      Past Surgical History:  Procedure Laterality Date   ADENOIDECTOMY     ANKLE SURGERY  1992   right ankle - scar tissue   DILATION AND CURETTAGE OF UTERUS  2006   History of Menometrorrhagia   INNER EAR SURGERY  1966 to 1986   6 surgeries on right ear   LAPAROSCOPY  1993   normal   REDUCTION MAMMAPLASTY Bilateral 2018   uterine ablation  2011   WRIST SURGERY  2001   for de-quervaine - bilateral wrists - tendonitis     Current Outpatient Medications:    ALPRAZolam (XANAX) 0.5 MG tablet, TAKE ONE TABLET BY MOUTH TWICE A DAY AS NEEDED FOR ANXIETY, Disp: 60 tablet, Rfl: 5   azithromycin (ZITHROMAX) 250 MG tablet, Take 2 tablets on day 1, then 1 tablet daily on days 2 through 5, Disp: 6 tablet, Rfl: 0   busPIRone (BUSPAR) 30 MG tablet, TAKE 1 TABLET BY MOUTH 2 TIMES A DAY, Disp: 60 tablet, Rfl: 3   Cholecalciferol (VITAMIN D3) 125 MCG (5000 UT) CAPS, Take 1 capsule (5,000 Units total) by mouth daily., Disp: 90 capsule, Rfl: 3   dicyclomine (BENTYL) 10 MG capsule, TAKE ONE CAPSULE BY MOUTH EVERY 6 HOURS AS NEEDED FOR ABDOMINAL PAIN, Disp: 30 capsule, Rfl: 1   DULoxetine (CYMBALTA) 60 MG capsule, TAKE 1 CAPSULE BY MOUTH TWICE  DAILY, Disp: 200 capsule, Rfl: 1   esomeprazole (NEXIUM) 40 MG capsule, Take 1 capsule (40 mg total) by mouth 2 (two) times daily before a meal. PLEASE SCHEDULE A YEARLY FOLLOW UP., Disp: 180 capsule, Rfl: 3   fluticasone (FLONASE) 50 MCG/ACT nasal spray, SPRAY 1 SPRAY IN EACH NOSTRIL ONCE DAILY AS NEEDED FOR ALLERGIES OR RHINITIS, Disp: 16 mL, Rfl:  2   lamoTRIgine (LAMICTAL) 25 MG tablet, TAKE 1 TABLET BY MOUTH 2 TIMES A DAY, Disp: 60 tablet, Rfl: 2   ondansetron (ZOFRAN-ODT) 4 MG disintegrating tablet, , Disp: , Rfl:    tiZANidine (ZANAFLEX) 2 MG tablet, TAKE 1 TABLET BY MOUTH 3 TIMES A DAY AS NEEDED FOR PAIN/SPASM, Disp: , Rfl:    traZODone (DESYREL) 50 MG tablet, TAKE  TWO TABLETS BY MOUTH AT BEDTIME, Disp: 60 tablet, Rfl: 2  Allergies  Allergen Reactions   Gabapentin Other (See Comments)   Mirtazapine Other (See Comments)    Severe hallucinations   Prednisone Rash    Face redness and swollen per patient  Face redness and swollen per patient  Face redness and swollen per patient   Pregabalin Other (See Comments)    Delusions   ROS neg/noncontributory except as noted HPI/below      Objective:     BP (!) 145/77   Pulse 85   Temp (!) 97 F (36.1 C) (Temporal)   Resp 16   Ht 5\' 3"  (1.6 m)   Wt 126 lb (57.2 kg)   SpO2 98%   BMI 22.32 kg/m  Wt Readings from Last 3 Encounters:  03/20/23 126 lb (57.2 kg)  01/04/23 128 lb (58.1 kg)  11/13/22 128 lb 12.8 oz (58.4 kg)    Physical Exam   Gen: WDWN NAD HEENT: NCAT, conjunctiva not injected, sclera nonicteric TM WNL L.  R w/chronic bubbles behind TM-clear, OP moist, no exudates  NECK:  supple, no thyromegaly, no nodes,  CARDIAC: RRR, S1S2+, no murmur.  LUNGS: CTAB. No wheezes EXT:  no edema MSK: no gross abnormalities.  NEURO: A&O x3.  CN II-XII intact.  PSYCH: normal mood. Good eye contact     Assessment & Plan:  Bronchitis  Other orders -     Azithromycin; Take 2 tablets on day 1, then 1 tablet daily on days 2 through 5  Dispense: 6 tablet; Refill: 0  Assessment and Plan    Upper Respiratory Infection Presents with symptoms of URI, including cough, headache, rhinorrhea, congestion, right otalgia, and low-grade fever for 1.5 weeks. Symptoms have worsened, with additional dizziness and diarrhea. Tested negative for COVID-19 with an older test kit and has  received a flu shot. Differential includes viral URI, bacterial sinusitis, and possible RSV. History of frequent ear infections and chronic otorrhea. Discussed Z-Pak (azithromycin) for potential bacterial infection, noting it has been years since last antibiotic use. Prescribe Z-Pak (azithromycin) and send to [facility] [address]. Advise use of artificial tears for dry eyes. Instruct to monitor symptoms and report any worsening or new symptoms, such as eye discharge or drainage. Continue using Mucinex (guaifenesin) for sinus congestion. Follow up with ENT specialist as previously scheduled.  Chronic Ear Infections Chronic ear infections with persistent otorrhea and cauliflower-like appearance of the eardrum. Current episode includes right otalgia and increased drainage. Appearance likely chronic and not indicative of acute change. Follow up with ENT specialist as previously scheduled.  General Health Maintenance Actively managing health, including receiving a flu shot and losing 32 pounds over the past year. Uses a fitness tracker to monitor activity and diet. Encourage continued use of fitness tracker and healthy lifestyle choices.  Follow-up Follow up with ENT specialist as previously scheduled. Proceed with scheduled surgery for ischiofemoral nerve in April.        Return if symptoms worsen or fail to improve.  Angelena Sole, MD

## 2023-03-22 ENCOUNTER — Ambulatory Visit: Payer: Medicare Other | Admitting: Psychology

## 2023-03-22 NOTE — Progress Notes (Deleted)
[facility] Counselor/Therapist Progress Note  Patient ID: Noriko Macari, MRN: [id number],    Date: 03/22/2023  Time Spent: 9:00am-9:50am   50 minutes   Treatment Type: Individual Therapy  Reported Symptoms: stress  Mental Status Exam: Appearance:  Casual     Behavior: Appropriate  Motor: Normal  Speech/Language:  Normal Rate  Affect: Appropriate  Mood: normal  Thought process: normal  Thought content:   WNL  Sensory/Perceptual disturbances:   WNL  Orientation: oriented to person, place, time/date, and situation  Attention: Good  Concentration: Good  Memory: WNL  Fund of knowledge:  Good  Insight:   Good  Judgment:  Good  Impulse Control: Good   Risk Assessment: Danger to Self:  No Self-injurious Behavior: No Danger to Others: No Duty to Warn:no Physical Aggression / Violence:No  Access to Firearms a concern: No  Gang Involvement:No   Subjective: Pt present for face-to-face individual therapy via video.  Pt consents to telehealth video session and is aware of limitations and benefits of virtual sessions. Location of pt: home Location of therapist: home office.   Pt talked about  Worked on self care strategies.   Provided supportive therapy.    Interventions: Cognitive Behavioral Therapy and Insight-Oriented  Diagnosis:  F33.1  Plan of Care: Recommend ongoing therapy.  Pt participated in setting treatment goals.  She wants to improve coping skills and self esteem.   Plan to meet monthly.    Treatment Plan (Treatment Plan Target Date:  05/30/2023) Client Abilities/Strengths  Pt is bright, engaging, and motivated for therapy.   Client Treatment Preferences  Individual therapy.  Client Statement of Needs  Improve coping skills.  Symptoms  Depressed or irritable mood. Low self-esteem. Unresolved grief issues.   Problems Addressed  Unipolar Depression Goals 1. Alleviate depressive symptoms and return to previous level of  effective functioning. 2. Appropriately grieve the loss in order to normalize mood and to return to previously adaptive level of functioning. Objective Learn and implement behavioral strategies to overcome depression. Target Date: 2023-05-30 Frequency: Monthly  Progress: 50 Modality: individual  Related Interventions Engage the client in "behavioral activation," increasing his/her activity level and contact with sources of reward, while identifying processes that inhibit activation.  Use behavioral techniques such as instruction, rehearsal, role-playing, role reversal, as needed, to facilitate activity in the client's daily life; reinforce success. Assist the client in developing skills that increase the likelihood of deriving pleasure from behavioral activation (e.g., assertiveness skills, developing an exercise plan, less internal/more external focus, increased social involvement); reinforce success. Objective Identify important people in life, past and present, and describe the quality, good and poor, of those relationships. Target Date: 2023-05-30 Frequency: Monthly  Progress: 50 Modality: individual  Related Interventions Conduct Interpersonal Therapy beginning with the assessment of the client's "interpersonal inventory" of important past and present relationships; develop a case formulation linking depression to grief, interpersonal role disputes, role transitions, and/or interpersonal deficits). Objective Learn and implement problem-solving and decision-making skills. Target Date: 2023-05-30 Frequency: Monthly  Progress: 50 Modality: individual  Related Interventions Conduct Problem-Solving Therapy using techniques such as psychoeducation, modeling, and role-playing to teach client problem-solving skills (i.e., defining a problem specifically, generating possible solutions, evaluating the pros and cons of each solution, selecting and implementing a plan of action, evaluating the efficacy  of the plan, accepting or revising the plan); role-play application of the problem-solving skill to a real life issue. Encourage in the client the development of a positive problem orientation in which problems  and solving them are viewed as a natural part of life and not something to be feared, despaired, or avoided. 3. Develop healthy interpersonal relationships that lead to the alleviation and help prevent the relapse of depression. 4. Develop healthy thinking patterns and beliefs about self, others, and the world that lead to the alleviation and help prevent the relapse of depression. 5. Recognize, accept, and cope with feelings of depression. Diagnosis F33.1  Conditions For Discharge Achievement of treatment goals and objectives   Salomon Fick, LCSW

## 2023-03-23 ENCOUNTER — Encounter: Payer: Self-pay | Admitting: Family Medicine

## 2023-03-23 ENCOUNTER — Other Ambulatory Visit: Payer: Self-pay | Admitting: *Deleted

## 2023-03-23 MED ORDER — AMOXICILLIN 500 MG PO CAPS: 500.0000 mg | ORAL_CAPSULE | Freq: Three times a day (TID) | ORAL | 0 refills | Status: AC

## 2023-03-24 ENCOUNTER — Other Ambulatory Visit: Payer: Self-pay | Admitting: Gastroenterology

## 2023-03-26 ENCOUNTER — Other Ambulatory Visit: Payer: Self-pay | Admitting: *Deleted

## 2023-03-26 MED ORDER — FLUCONAZOLE 150 MG PO TABS: 150.0000 mg | ORAL_TABLET | Freq: Once | ORAL | 0 refills | Status: AC

## 2023-04-05 ENCOUNTER — Ambulatory Visit (INDEPENDENT_AMBULATORY_CARE_PROVIDER_SITE_OTHER): Payer: Self-pay | Admitting: Behavioral Health

## 2023-04-05 DIAGNOSIS — Z0389 Encounter for observation for other suspected diseases and conditions ruled out: Secondary | ICD-10-CM

## 2023-04-05 NOTE — Progress Notes (Signed)
Pt did not show for scheduled appt and did not provide 24 hour notice as required. Additional fees to be assessed.

## 2023-04-06 DIAGNOSIS — R9082 White matter disease, unspecified: Secondary | ICD-10-CM | POA: Diagnosis not present

## 2023-04-06 DIAGNOSIS — R519 Headache, unspecified: Secondary | ICD-10-CM | POA: Diagnosis not present

## 2023-04-11 ENCOUNTER — Encounter: Payer: Self-pay | Admitting: Internal Medicine

## 2023-04-12 ENCOUNTER — Encounter: Payer: Self-pay | Admitting: Internal Medicine

## 2023-04-12 ENCOUNTER — Ambulatory Visit: Payer: Medicare Other | Admitting: Psychology

## 2023-04-12 ENCOUNTER — Ambulatory Visit: Payer: Medicare Other | Admitting: Neurology

## 2023-04-12 NOTE — Progress Notes (Signed)
Reviewed brain MRI from 04/06/2023. Shows stable bilateral periatrial white matter lesions, unchanged from prior studies. No new lesions or active inflammation. Consistent with stable MS without acute progression. Will discuss at upcoming appointment on 04/16/2023. See detailed explanation in Patient Message.

## 2023-04-14 ENCOUNTER — Other Ambulatory Visit: Payer: Self-pay | Admitting: Internal Medicine

## 2023-04-14 DIAGNOSIS — Z0000 Encounter for general adult medical examination without abnormal findings: Secondary | ICD-10-CM

## 2023-04-16 ENCOUNTER — Ambulatory Visit (INDEPENDENT_AMBULATORY_CARE_PROVIDER_SITE_OTHER): Payer: Medicare Other | Admitting: Internal Medicine

## 2023-04-16 ENCOUNTER — Ambulatory Visit: Payer: Medicare Other

## 2023-04-16 ENCOUNTER — Encounter: Payer: Self-pay | Admitting: Internal Medicine

## 2023-04-16 VITALS — BP 112/72 | HR 70 | Temp 97.6°F | Ht 63.0 in | Wt 127.4 lb

## 2023-04-16 DIAGNOSIS — R5382 Chronic fatigue, unspecified: Secondary | ICD-10-CM

## 2023-04-16 DIAGNOSIS — E041 Nontoxic single thyroid nodule: Secondary | ICD-10-CM | POA: Diagnosis not present

## 2023-04-16 DIAGNOSIS — E559 Vitamin D deficiency, unspecified: Secondary | ICD-10-CM

## 2023-04-16 DIAGNOSIS — H9011 Conductive hearing loss, unilateral, right ear, with unrestricted hearing on the contralateral side: Secondary | ICD-10-CM | POA: Diagnosis not present

## 2023-04-16 DIAGNOSIS — Z780 Asymptomatic menopausal state: Secondary | ICD-10-CM | POA: Insufficient documentation

## 2023-04-16 DIAGNOSIS — Z0000 Encounter for general adult medical examination without abnormal findings: Secondary | ICD-10-CM | POA: Diagnosis not present

## 2023-04-16 DIAGNOSIS — E611 Iron deficiency: Secondary | ICD-10-CM

## 2023-04-16 NOTE — Assessment & Plan Note (Signed)
 Iron Deficiency Anemia Iron deficiency anemia is present, with no current supplementation. The importance of monitoring iron levels to prevent recurrence was discussed. Check iron levels.

## 2023-04-16 NOTE — Progress Notes (Signed)
 Phone [phone]  -- Comprehensive Physical Exam (CPE) Annual Office Visit  --  Patient:  Jordan [name]      Age: 63 y.o.       Sex:  female  Date:   04/16/2023 Patient Care Team: [name], MD as PCP - General (Internal Medicine) Jodelle Red, MD as PCP - Cardiology (Cardiology) Milagros Evener, MD (Psychiatry) Love, Genene Churn, MD (Neurology) Hobson-Webb, Remonia Richter, MD (Neurology) Armbruster, [name], MD as Consulting Physician (Gastroenterology) Today's Healthcare Provider: [name], MD  ------------------------------------------------------------------------------------------------------------------------------------- Chief Complaint  Patient presents with   Annual Exam    Purpose of Visit: Comprehensive preventive health assessment and personalized health maintenance planning.  This encounter was conducted as a Comprehensive Physical Exam (CPE) preventive care annual visit. The patient's medical history and problem list were reviewed to inform individualized preventive care recommendations. No problem-specific medical treatment was provided during this visit.   As part of her medicare advantage plan- she is eligible for this visit: Comprehensive Physical Exam (CPE) preventive care annual visit, which differs from the Annual Wellness Visit (AWV),which will be conducted separately. Assessment & Plan Encounter for annual health examination  General Health Maintenance General health is good with regular follow-ups for various screenings. The importance of diet, exercise, and preventive care was discussed. Emphasized the benefits of 3D mammograms over standard ones, which miss 15% of cases. Advised on the use of nasal spray to reduce pneumonia risk. Encourage 150 minutes of exercise per week, recommend a diet rich in fruits, vegetables, fiber, and healthy fats, and advise against processed foods. Recommend one sexual partner and condom use. Continue  routine mammograms and gynecological care, monitor skin spots, and follow up with a dermatologist. Use nasal spray at 45 degrees to reduce pneumonia risk.  Follow-up Schedule an annual check-up, follow up on lab results via MyChart, and coordinate the bone density scan with the mammogram appointment. Thyroid nodule Thyroid Nodule There is a palpable thickening in the right thyroid gland, likely a nodule, with a family history of non-cancerous thyroid conditions. The nodule has been present for about a year without significant growth. An ultrasound is warranted to rule out malignancy, considering the family history and duration. The cost and benefits of the ultrasound were discussed, and there is agreement to proceed. Early detection can prevent spread, as shown in two previous beneficial cases. Order a thyroid ultrasound, check thyroid hormones, and order a full thyroid panel. Postmenopausal estrogen deficiency Postmenopausal Status Postmenopausal status requires bone density monitoring. The importance of bone density scans for osteoporosis screening was discussed. Order a bone density scan. Vitamin D deficiency Vitamin D Deficiency There is a vitamin D deficiency with recent supplementation, last checked in October. The importance of monitoring vitamin D levels due to proven deficiency was discussed. Check vitamin D levels. Iron deficiency Iron Deficiency Anemia Iron deficiency anemia is present, with no current supplementation. The importance of monitoring iron levels to prevent recurrence was discussed. Check iron levels. Conductive hearing loss of right ear with unrestricted hearing of left ear Hearing Loss Chronic hearing loss in the right ear is noted, with a history of six surgeries in childhood. A recent ear infection was treated with antibiotics. The right tympanic membrane shows severe scarring. There is an upcoming appointment with an ENT, and follow-up with the ENT should  continue. Chronic fatigue Chronic fatigue is reported, but there is no current pursuit of further evaluation or treatment.  Diagnoses and all orders for this visit: Encounter for  annual health examination -     Lipid panel -     Comprehensive metabolic panel -     CBC with Differential/Platelet Thyroid nodule -     US THYROID; Future -     Thyroid Panel With TSH Postmenopausal estrogen deficiency -     DG Bone Density; Future -     Vitamin D (25 hydroxy) Vitamin D deficiency Iron deficiency -     Ferritin Conductive hearing loss of right ear with unrestricted hearing of left ear Chronic fatigue   Today's preventive care visit included comprehensive health maintenance evaluation and gap closure alongside extensive anticipatory guidance, as follows: #  Eye exams: recommended every 1-2 years.   She voiced understanding and intent to adhere to that plan. #  Dental health: recommended regular tooth brushing, flossing, and dental visits every 6 months.  She voiced understanding and intent to adhere to that plan. #  Sinus health: nightly SimplySaline or similar recommended now.  She voiced understanding and intent to adhere to that plan. #  Diet/Exercise:   recommended regular exercise (150 min) and diet rich and fruits and vegetables and fiber and healthy fats to reduce risk of heart attack and stroke. She voiced understanding and intent to adhere to this plan. #  Sexuality: recommended STD prevention via partner selection & condoms.  Offered contraception / STD checks / genital wart treatment if appropriate. #  Breast/Uterine/Ovarian cancer screening: strongly recommended follow up gynecology for comprehensive screenings. #  Thyroid cancer screening:  discussed need to palpate thyroid about every 6 months for nodules #  Colon cancer screening:  had  colonoscopy 5 months ago next due soon due to stomach issues but not cancer #  Skin cancer screening: recommended regular sunscreen use. she  denies worrisome, changing, or new skin lesions.  #  Osteoporosis prevention:  recommended to maintain a good source of calcium and vitamin D in diet.  She denies any personal history of early osteoporosis or fragility fractures #  Substance use: recommended absolute abstinence from all vaped or smoked recreational or illicit substances of abuse such as tobacco, nicotine, alcohol, illicit drugs, even sugar.  #  Safety:  recommended avoiding high risk activities, wear seat belts and not texting & driving #  Health maintenance and immunizations reviewed and she was encouraged to complete any incomplete and release any missing immunization records for [place] Immunization History  Administered Date(s) Administered   Fluad Quad(high Dose 65+) 10/22/2020   Influenza Split 01/02/2012   Influenza, Quadrivalent, Recombinant, Inj, Pf 11/23/2019   Influenza, Seasonal, Injecte, Preservative Fre 11/12/2012, 11/14/2013, 12/02/2014   Influenza,inj,Quad PF,6+ Mos 11/12/2012, 11/14/2013, 12/02/2014, 12/30/2015, 11/24/2016, 11/05/2017, 12/17/2018   Influenza-Unspecified 12/30/2015, 11/24/2016, 11/05/2017, 12/17/2018, 11/23/2019, 10/21/2021   PFIZER(Purple Top)SARS-COV-2 Vaccination 05/01/2019, 05/22/2019, 01/07/2020   PNEUMOCOCCAL CONJUGATE-20 04/13/2022   Pfizer Fall 2023 Covid-19 Vaccine 6mos thru 29yrs  10/21/2021   Td 04/21/2005   Tdap 01/06/2014   #  We attempted to update vaccination records and provide any missing vaccines that are recommended. Health Maintenance Due  Topic Date Due   Medicare Annual Wellness (AWV)  04/14/2023    # Incomplete health maintenance issues listed above were brought up for discussion and she was encouraged to complete with our assistance. # Recommended follow up:  continued annual preventive health maintenance exams  Subjective   She has Claustrophobia; MULTIPLE SCLEROSIS, PROGRESSIVE/RELAPSING; GLAUCOMA NEC; HAY FEVER; FIBROCYSTIC BREAST DISEASE; Headache; Back pain,  thoracic; Bilateral dry eyes; Mild cognitive impairment with memory loss; Dyspepsia  and disorder of function of stomach; Fibromyalgia; Back pain, lumbosacral; Degeneration of cervical intervertebral disc; Severe recurrent major depression without psychotic features (HCC); Low back pain; Hypertriglyceridemia; Vitamin D deficiency; Iron deficiency; Hip pain; Abnormal colonoscopy; Thyroid nodule; Postmenopausal estrogen deficiency; and Conductive hearing loss of right ear with unrestricted hearing of left ear on their problem list. 63 year old female who presents for an annual physical exam.  She experiences chronic fatigue and hearing loss, primarily in the right ear. She has a history of six surgeries on her right ear during childhood, with the last surgery at age 1. She was born with an ear infection and recently had a severe ear infection treated with antibiotics.  She has thyroid issues, including a 'lumpy thyroid' and a lump on the right side of her thyroid for about a year. Her mother had a non-cancerous thyroid condition. She is concerned about the lump possibly growing.  She has digestive issues and requires more frequent colonoscopies. Her last colonoscopy, about five months ago, was normal. She experiences digestive issues when taking ibuprofen.  She has a history of vitamin D deficiency and is currently on vitamin D supplements. Her last vitamin D check was in October. She also has a history of iron deficiency anemia but is not currently taking iron supplements.  She has skin issues, including a spot on her back and several moles. She usually attends annual dermatology exams but missed her last one. She has a history of actinic keratosis and other benign skin lesions.  She has sinus issues and is currently taking gabapentin, Mucinex, and using sinus rinses. She also takes trazodone and Xanax at night for sleep, without daytime drowsiness.  Past Medical History - Fatigue - Hearing loss -  Born with ear infection - Thyroid nodule - Stomach issues - Vitamin D deficiency - Iron deficiency anemia    Review of Systems  Constitutional:  Positive for malaise/fatigue. Negative for chills, diaphoresis, fever and weight loss.  HENT:  Positive for congestion and hearing loss. Negative for ear discharge, ear pain, nosebleeds, sinus pain, sore throat and tinnitus.   Eyes:  Negative for blurred vision, double vision, photophobia, pain, discharge and redness.  Respiratory:  Negative for cough, hemoptysis, sputum production, shortness of breath, wheezing and stridor.   Cardiovascular:  Negative for chest pain, palpitations, orthopnea, claudication, leg swelling and PND.  Gastrointestinal:  Negative for abdominal pain, blood in stool, constipation, diarrhea, heartburn, melena, nausea and vomiting.  Genitourinary:  Negative for dysuria, flank pain, frequency, hematuria and urgency.  Musculoskeletal:  Negative for back pain, falls, joint pain, myalgias and neck pain.  Skin:  Negative for itching and rash.  Neurological:  Negative for dizziness, tingling, tremors, sensory change, speech change, focal weakness, seizures, loss of consciousness, weakness and headaches.  Endo/Heme/Allergies:  Negative for environmental allergies and polydipsia. Does not bruise/bleed easily.  Psychiatric/Behavioral:  Negative for depression, hallucinations, memory loss, substance abuse and suicidal ideas. The patient is not nervous/anxious and does not have insomnia.      PROBLEMS,PMH, PSH, FH, prior meds, allergies, and SH were each reviewed and updated:    04/16/2023    1:57 PM  Depression screen PHQ 2/9  Decreased Interest 0  Down, Depressed, Hopeless 0  PHQ - 2 Score 0   Patient Active Problem List   Diagnosis Date Noted   Thyroid nodule 04/16/2023   Postmenopausal estrogen deficiency 04/16/2023   Conductive hearing loss of right ear with unrestricted hearing of left ear 04/16/2023   Abnormal  colonoscopy 01/05/2023   Iron deficiency 11/13/2022   Hip pain 11/13/2022   Vitamin D deficiency 04/19/2022   Hypertriglyceridemia 03/24/2021   Low back pain 10/19/2020   Severe recurrent major depression without psychotic features (HCC) 09/27/2015    Class: Chronic   Fibromyalgia 04/03/2013   Back pain, lumbosacral 04/03/2013   Degeneration of cervical intervertebral disc 04/03/2013   Dyspepsia and disorder of function of stomach 03/16/2012   Mild cognitive impairment with memory loss 01/03/2012   Bilateral dry eyes 05/13/2011   Back pain, thoracic 06/15/2010   MULTIPLE SCLEROSIS, PROGRESSIVE/RELAPSING 06/02/2009   Claustrophobia 04/25/2007   HAY FEVER 02/18/2007   GLAUCOMA NEC 12/05/2006   FIBROCYSTIC BREAST DISEASE 12/05/2006   Headache 12/05/2006   Past Medical History:  Diagnosis Date   Acute upper respiratory infections of unspecified site    ADD (attention deficit disorder)    Allergic rhinitis due to pollen    Anal fissure    Anemia, unspecified    Anxiety disorder    Asthma    Carpal tunnel syndrome 04/25/2007   Qualifier: Diagnosis of   By: Debby Bud MD, Rosalyn Gess        Chicken pox    Complication of anesthesia    wakes up slow   Cough 07/03/2011   Followed in Pulmonary clinic/ [facility]/ [place]      - Spirometry nl during flare 07/03/2011      Had severe asthmatic bronchitis vs pneumonia requiring hospitalization April '13 for severe bronchospasm. Subsequently had persistent cough and bronchospasm for several months.      Depression, recurrent (HCC) 12/05/2006   Qualifier: Diagnosis of   By: Roxan Hockey CMA, Jessica      History or severe depression - followed by Dr. Evelene Croon  Additional diagnosis: ADD, agraphobia, self-mutilation.  Med added - abilify     Depressive disorder, not elsewhere classified    Diffuse cystic mastopathy    Falls    Fibromyalgia    GERD (gastroesophageal reflux disease)    Headache(784.0)    History of anorexia nervosa    History of  bulimia    IBS (irritable bowel syndrome)    IGT (impaired glucose tolerance) 03/24/2021   Lab Results      Component    Value    Date           HGBA1C    5.3    04/13/2022           Incontinence without sensory awareness 06/01/2009   Qualifier: Diagnosis of   By: Debby Bud MD, Rosalyn Gess        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    Iron deficiency anemia 03/24/2021   Lump or mass in breast    Meningitis, unspecified(322.9)    NASAL POLYP 05/14/2009   NECK PAIN, CHRONIC 02/19/2007   Qualifier: Diagnosis of   By: Debby Bud MD, Rosalyn Gess        Oral thrush    Other specified glaucoma    Perforation of tympanic membrane, unspecified    Radial styloid tenosynovitis    Seizures (HCC)    TYMPANIC MEMBRANE PERFORATION 12/05/2006   Annotation: REPAIRED  Qualifier: Diagnosis of   By: Roxan Hockey CMA, Jessica      Replacing diagnoses that were inactivated after the 05/22/22 regulatory import      Past Surgical History:  Procedure Laterality Date   ADENOIDECTOMY     ANKLE SURGERY  1992   right ankle - scar tissue   DILATION AND CURETTAGE OF UTERUS  2006  History of Menometrorrhagia   INNER EAR SURGERY  1966 to 1986   6 surgeries on right ear   LAPAROSCOPY  1993   normal   REDUCTION MAMMAPLASTY Bilateral 2018   uterine ablation  2011   WRIST SURGERY  2001   for de-quervaine - bilateral wrists - tendonitis   Family History  Problem Relation Age of Onset   Dementia Mother    Alcohol abuse Mother    Ovarian cancer Mother    Heart disease Mother    Hypertension Mother    Coronary artery disease Mother    Colon polyps Mother 67   Thyroid disease Mother    Heart failure Father    Hypertension Father    Hyperlipidemia Father    Diabetes Father    Alcohol abuse Sister    Breast cancer Maternal Grandmother    Alcohol abuse Maternal Grandfather    Anxiety disorder Maternal Aunt    Depression Maternal Aunt    Alcohol abuse Maternal Aunt    Alcohol abuse Maternal Uncle    Colon cancer Neg Hx     Esophageal cancer Neg Hx    Stomach cancer Neg Hx    Rectal cancer Neg Hx    Allergies  Allergen Reactions   Gabapentin Other (See Comments)   Mirtazapine Other (See Comments)    Severe hallucinations   Prednisone Rash    Face redness and swollen per patient    Pregabalin Other (See Comments)    Delusions    Social History   Tobacco Use   Smoking status: Never   Smokeless tobacco: Never  Vaping Use   Vaping status: Never Used  Substance Use Topics   Alcohol use: Yes    Alcohol/week: 7.0 standard drinks of alcohol    Types: 7 Glasses of wine per week    Comment: 2 glasses of wine per night   Drug use: No     I attest that I have reviewed and confirmed the patients current medications to meet the medication reconciliation requirement  Current Outpatient Medications on File Prior to Visit  Medication Sig   ALPRAZolam (XANAX) 0.5 MG tablet TAKE ONE TABLET BY MOUTH TWICE A DAY AS NEEDED FOR ANXIETY   busPIRone (BUSPAR) 30 MG tablet TAKE 1 TABLET BY MOUTH 2 TIMES A DAY   Cholecalciferol (VITAMIN D3) 125 MCG (5000 UT) CAPS Take 1 capsule (5,000 Units total) by mouth daily.   dicyclomine (BENTYL) 10 MG capsule TAKE ONE CAPSULE BY MOUTH EVERY 6 HOURS AS NEEDED FOR ABDOMINAL PAIN   DULoxetine (CYMBALTA) 60 MG capsule TAKE 1 CAPSULE BY MOUTH TWICE  DAILY   esomeprazole (NEXIUM) 40 MG capsule TAKE 1 CAPSULE BY MOUTH TWICE  DAILY BEFORE MEALS   fluticasone (FLONASE) 50 MCG/ACT nasal spray SPRAY 1 SPRAY IN EACH NOSTRIL ONCE DAILY AS NEEDED FOR ALLERGIES OR RHINITIS   lamoTRIgine (LAMICTAL) 25 MG tablet TAKE 1 TABLET BY MOUTH 2 TIMES A DAY   tiZANidine (ZANAFLEX) 2 MG tablet TAKE 1 TABLET BY MOUTH 3 TIMES A DAY AS NEEDED FOR PAIN/SPASM   traZODone (DESYREL) 50 MG tablet TAKE TWO TABLETS BY MOUTH AT BEDTIME   No current facility-administered medications on file prior to visit.   Medications Discontinued During This Encounter  Medication Reason   ondansetron (ZOFRAN-ODT) 4 MG  disintegrating tablet     Objective  BP 112/72   Pulse 70   Temp 97.6 F (36.4 C) (Temporal)   Ht 5\' 3"  (1.6 m)   Wt 127 lb  6.4 oz (57.8 kg)   SpO2 98%   BMI 22.57 kg/m  Body mass index is 22.57 kg/m.  Wt Readings from Last 3 Encounters:  04/16/23 127 lb 6.4 oz (57.8 kg)  03/20/23 126 lb (57.2 kg)  01/04/23 128 lb (58.1 kg)    This is a polite, friendly, and genuine person   GENERAL:  NAD, AAO, not ill-appearing  HENT:  NCAT, normal nose, mucous membranes moist.  Tympanic membrane evaluated and normal appearing bilaterally, oropharynx evaluated and normal appearingRight tympanic membrane with severe scarring. Nose normal. NECK: Thyroid gland with lumpy texture, no clearly distinct nodule felt but a general fullness and heterogeneous texture in right thyroid area noted of concern(s). BREAST: No axillary lymphadenopathy. SKIN: Multiple skin lesions on back, monitoring advised. Actinic keratosis on left neck. Photographs Taken 04/16/2023 :     EYES:  sclera nonicteric, no injection CV:  RRR, no murmurs/rubs/gallops LUNG: CTAB, normal WOB, no audible wheezing or stridor ABD: soft, nondistended, no guarding, no palpable tumor GYN:  expressed a preference to do breast and gynecological exam at another office which  I supported and encouraged explaining the importance of routine gynecological screenings. SKIN: warm, dry, no lesions of concern NEURO: alert, no focal deficit obvious, articulate speech PSYCH: normal mood, behavior, thought content  Results LABS Vitamin D: Deficient (11/2022)  DIAGNOSTIC Colonoscopy: Normal (10/2022)  Results for orders placed or performed in visit on 12/20/22  IBC + Ferritin   Collection Time: 12/20/22 10:45 AM  Result Value Ref Range   Iron 69 42 - 145 ug/dL   Transferrin 409.8 119.1 - 360.0 mg/dL   Saturation Ratios 47.8 (L) 20.0 - 50.0 %   Ferritin 22.1 10.0 - 291.0 ng/mL   TIBC 383.6 250.0 - 450.0 mcg/dL  CBC with  Differential/Platelet   Collection Time: 12/20/22 10:45 AM  Result Value Ref Range   WBC 8.9 4.0 - 10.5 K/uL   RBC 4.32 3.87 - 5.11 Mil/uL   Hemoglobin 14.2 12.0 - 15.0 g/dL   HCT 29.5 62.1 - 30.8 %   MCV 96.9 78.0 - 100.0 fl   MCHC 34.0 30.0 - 36.0 g/dL   RDW 65.7 84.6 - 96.2 %   Platelets 216.0 150.0 - 400.0 K/uL   Neutrophils Relative % 70.4 43.0 - 77.0 %   Lymphocytes Relative 18.3 12.0 - 46.0 %   Monocytes Relative 7.3 3.0 - 12.0 %   Eosinophils Relative 3.1 0.0 - 5.0 %   Basophils Relative 0.9 0.0 - 3.0 %   Neutro Abs 6.3 1.4 - 7.7 K/uL   Lymphs Abs 1.6 0.7 - 4.0 K/uL   Monocytes Absolute 0.7 0.1 - 1.0 K/uL   Eosinophils Absolute 0.3 0.0 - 0.7 K/uL   Basophils Absolute 0.1 0.0 - 0.1 K/uL   *Note: Due to a large number of results and/or encounters for the requested time period, some results have not been displayed. A complete set of results can be found in Results Review.     Notes:  This document was synthesized by artificial intelligence (Abridge) using HIPAA-compliant recording of the clinical interaction;   We discussed the use of AI scribe software for clinical note transcription with the patient, who gave verbal consent to proceed.    This encounter employed state-of-the-art, real-time, collaborative documentation. The patient was empowered to actively review and assist in updating their electronic medical record on a shared monitor, ensuring transparency and improving accuracy.    Prior to and at the beginning of Comprehensive Physical Exam (CPE)  preventive care annual visit appointment types  we clarify to patients "Our goal today is to focus on your preventive or annual Comprehensive Physical Exam (CPE) preventive care annual visit, which typically covers routine screenings and overall health maintenance. However, if you share any new or concerning symptoms--such as dizziness, passing out, severe pain, or anything else that may point to a more serious issue--we are both  legally and ethically required to evaluate it. We cannot simply overlook or ignore such concerns, even if you later decide you don't want to discuss them, because it could jeopardize your health.  If addressing a new concern takes [place] beyond the scope of the preventive visit, we may need to bill separately for that portion of care. We understand financial considerations are important, and we're happy to discuss your options if something new comes up. However, we want to be clear that once you mention a potentially serious issue, we must investigate it; we can't ethically or legally exclude that from our records or our evaluation. Please let us know all of your questions or worries. Together, we can decide how best to manage them and how to minimize any unexpected costs, but we want to keep you safe above all else."   This disclosure is mandated by professional ethics and legal obligations, as healthcare providers must address any substantial health concerns raised during any patient interaction and a comprehensive ROS is required by insurance companies for billing preventive-care visit type.    Signed: [name], MD  [facility] [address] [place], [place] [address] Office:  [phone]    Health Maintenance, Female Adopting a healthy lifestyle and getting preventive care are important in promoting health and wellness. Ask your health care provider about: The right schedule for you to have regular tests and exams. Things you can do on your own to prevent diseases and keep yourself healthy. What should I know about diet, weight, and exercise? Eat a healthy diet  Eat a diet that includes plenty of vegetables, fruits, low-fat dairy products, and lean protein. Do not eat a lot of foods that are high in solid fats, added sugars, or sodium. Maintain a healthy weight Body mass index (BMI) is used to identify weight problems. It estimates body fat based on height  and weight. Your health care provider can help determine your BMI and help you achieve or maintain a healthy weight. Get regular exercise Get regular exercise. This is one of the most important things you can do for your health. Most adults should: Exercise for at least 150 minutes each week. The exercise should increase your heart rate and make you sweat (moderate-intensity exercise). Do strengthening exercises at least twice a week. This is in addition to the moderate-intensity exercise. Spend less time sitting. Even light physical activity can be beneficial. Watch cholesterol and blood lipids Have your blood tested for lipids and cholesterol at 63 years of age, then have this test every 5 years. Have your cholesterol levels checked more often if: Your lipid or cholesterol levels are high. You are older than 63 years of age. You are at high risk for heart disease. What should I know about cancer screening? Depending on your health history and family history, you may need to have cancer screening at various ages. This may include screening for: Breast cancer. Cervical cancer. Colorectal cancer. Skin cancer. Lung cancer. What should I know about heart disease, diabetes, and high blood pressure? Blood pressure and heart  disease High blood pressure causes heart disease and increases the risk of stroke. This is more likely to develop in people who have high blood pressure readings or are overweight. Have your blood pressure checked: Every 3-5 years if you are 29-56 years of age. Every year if you are 42 years old or older. Diabetes Have regular diabetes screenings. This checks your fasting blood sugar level. Have the screening done: Once every three years after age 39 if you are at a normal weight and have a low risk for diabetes. More often and at a younger age if you are overweight or have a high risk for diabetes. What should I know about preventing infection? Hepatitis B If you have a  higher risk for hepatitis B, you should be screened for this virus. Talk with your health care provider to find out if you are at risk for hepatitis B infection. Hepatitis C Testing is recommended for: Everyone born from [birth year] through 1965. Anyone with known risk factors for hepatitis C. Sexually transmitted infections (STIs) Get screened for STIs, including gonorrhea and chlamydia, if: You are sexually active and are younger than 63 years of age. You are older than 63 years of age and your health care provider tells you that you are at risk for this type of infection. Your sexual activity has changed since you were last screened, and you are at increased risk for chlamydia or gonorrhea. Ask your health care provider if you are at risk. Ask your health care provider about whether you are at high risk for HIV. Your health care provider may recommend a prescription medicine to help prevent HIV infection. If you choose to take medicine to prevent HIV, you should first get tested for HIV. You should then be tested every 3 months for as long as you are taking the medicine. Pregnancy If you are about to stop having your period (premenopausal) and you may become pregnant, seek counseling before you get pregnant. Take 400 to 800 micrograms (mcg) of folic acid every day if you become pregnant. Ask for birth control (contraception) if you want to prevent pregnancy. Osteoporosis and menopause Osteoporosis is a disease in which the bones lose minerals and strength with aging. This can result in bone fractures. If you are 68 years old or older, or if you are at risk for osteoporosis and fractures, ask your health care provider if you should: Be screened for bone loss. Take a calcium or vitamin D supplement to lower your risk of fractures. Be given hormone replacement therapy (HRT) to treat symptoms of menopause. Follow these instructions at home: Alcohol use Do not drink alcohol if: Your health care  provider tells you not to drink. You are pregnant, may be pregnant, or are planning to become pregnant. If you drink alcohol: Limit how much you have to: 0-1 drink a day. Know how much alcohol is in your drink. In the [place] one drink equals one 12 oz bottle of beer (355 mL), one 5 oz glass of wine (148 mL), or one 1 oz glass of hard liquor (44 mL). Lifestyle Do not use any products that contain nicotine or tobacco. These products include cigarettes, chewing tobacco, and vaping devices, such as e-cigarettes. If you need help quitting, ask your health care provider. Do not use street drugs. Do not share needles. Ask your health care provider for help if you need support or information about quitting drugs. General instructions Schedule regular health, dental, and eye exams. Stay current with your  vaccines. Tell your health care provider if: You often feel depressed. You have ever been abused or do not feel safe at home. Summary Adopting a healthy lifestyle and getting preventive care are important in promoting health and wellness. Follow your health care provider's instructions about healthy diet, exercising, and getting tested or screened for diseases. Follow your health care provider's instructions on monitoring your cholesterol and blood pressure. This information is not intended to replace advice given to you by your health care provider. Make sure you discuss any questions you have with your health care provider. Document Revised: 06/28/2020 Document Reviewed: 06/28/2020 Elsevier Patient Education  2024 [employer].

## 2023-04-16 NOTE — Assessment & Plan Note (Signed)
 Vitamin D Deficiency There is a vitamin D deficiency with recent supplementation, last checked in October. The importance of monitoring vitamin D levels due to proven deficiency was discussed. Check vitamin D levels.

## 2023-04-16 NOTE — Assessment & Plan Note (Signed)
 Thyroid Nodule There is a palpable thickening in the right thyroid gland, likely a nodule, with a family history of non-cancerous thyroid conditions. The nodule has been present for about a year without significant growth. An ultrasound is warranted to rule out malignancy, considering the family history and duration. The cost and benefits of the ultrasound were discussed, and there is agreement to proceed. Early detection can prevent spread, as shown in two previous beneficial cases. Order a thyroid ultrasound, check thyroid hormones, and order a full thyroid panel.

## 2023-04-16 NOTE — Assessment & Plan Note (Signed)
 Hearing Loss Chronic hearing loss in the right ear is noted, with a history of six surgeries in childhood. A recent ear infection was treated with antibiotics. The right tympanic membrane shows severe scarring. There is an upcoming appointment with an ENT, and follow-up with the ENT should continue.

## 2023-04-16 NOTE — Assessment & Plan Note (Signed)
 Postmenopausal Status Postmenopausal status requires bone density monitoring. The importance of bone density scans for osteoporosis screening was discussed. Order a bone density scan.

## 2023-04-16 NOTE — Patient Instructions (Signed)
 Managing Your Health Over Time  Managing every aspect of your health in a single visit isn't always feasible, but that's okay.  We addressed many preventive issues today and charted a course for future care. Acute conditions or preventive care measures may require further attention.  We encourage you to schedule a follow-up visit at your earliest convenience to discuss any unresolved issues.  We strongly encourage continued participation in annual preventive care visits to help [place] develop a more thorough understanding of your health and to help you maintain optimal wellness - please inquire about scheduling your next one with [place] at your earliest convenience.   VISIT SUMMARY:  Today, we conducted your annual physical exam. We discussed your chronic fatigue, hearing loss, thyroid issues, digestive concerns, vitamin D deficiency, iron deficiency anemia, skin issues, and sinus problems. We also reviewed your general health maintenance and preventive care measures.  YOUR PLAN:  -THYROID NODULE: A thyroid nodule is a growth in the thyroid gland. Given your family history and the presence of the nodule for about a year, we will perform an ultrasound and check your thyroid hormones to ensure it is not cancerous.  -HEARING LOSS: You have chronic hearing loss in your right ear, with a history of multiple surgeries and a recent ear infection. Continue with your upcoming ENT appointment for further evaluation.  -FATIGUE: You reported chronic fatigue, but we are not pursuing further evaluation or treatment at this time.  -IRON DEFICIENCY ANEMIA: Iron deficiency anemia occurs when your body lacks enough iron to produce healthy red blood cells. We will check your iron levels to monitor and prevent recurrence.  -VITAMIN D DEFICIENCY: Vitamin D deficiency means your body lacks enough vitamin D, which is essential for bone health. We will check your vitamin D levels to ensure your supplements are  working.  -POSTMENOPAUSAL STATUS: Postmenopausal status requires monitoring for bone health. We will order a bone density scan to screen for osteoporosis.  -GENERAL HEALTH MAINTENANCE: Your general health is good. We discussed the importance of a healthy diet, regular exercise, and preventive care. Continue with routine mammograms, gynecological care, and dermatology follow-ups. Use nasal spray at a 45-degree angle to reduce pneumonia risk.  INSTRUCTIONS:  Please schedule your annual check-up, follow up on lab results via MyChart, and coordinate your bone density scan with your mammogram appointment.      Next Steps  [x]   Schedule Follow-Up:  We recommend a follow-up appointment in 1 year for your next wellness visit.  If you develop any new problems, want to address any medical issues, or your condition worsens before then, please call us for an appointment or seek emergency care. [x]   Preventive Care:  Make sure to keep regular appointments with dental and vision professionals, use nightly nasal saline mist sprays to keep your sinuses clear and toothbrushing to protect your teeth. Use [employer] App or other app to track your sleep quality. Check blood pressure and heart rate routinely. [x]   Medical Information Release:  For any relevant medical information we don't have, please sign a release form at the front desk so we can obtain it for your records. [x]   Lab Tests:  Schedule any lab tests from today for within a week to ensure best insurance coverage.    Making the Most of Our Focused (20 minute) Appointments:  [x]   Clearly state your top concerns at the beginning of the visit to focus our discussion [x]   If you anticipate you will need more time, please inform  the front desk during scheduling - we can book multiple appointments in the same week. [x]   If you have transportation problems- use our convenient video appointments or ask about transportation support. [x]   We can get down to  business faster if you use MyChart to update information before the visit and submit non-urgent questions before your visit. Thank you for taking the time to provide details through MyChart.  Let our nurse know and she can import this information into your encounter documents.  Arrival and Wait Times: [x]   Arriving on time ensures that everyone receives prompt attention. [x]   Early morning (8a) and afternoon (1p) appointments tend to have shortest wait times. [x]   Unfortunately, we cannot delay appointments for late arrivals or hold slots during phone calls.  Bring to Your Next Appointment  [x]   Medications: Please bring all your medication bottles to your next appointment to ensure we have an accurate record of your prescriptions. [x]   Health Diaries: If you're monitoring any health conditions at home, keeping a diary of your readings can be very helpful for discussions at your next appointment.  Reviewing Your Records  [x]   Review your attached preventive care information at the end of these patient instructions. [x]   Review this early draft of your clinical encounter notes below and the final encounter summary tomorrow on MyChart after its been completed.   Encounter for annual health examination -     Lipid panel -     Comprehensive metabolic panel -     CBC with Differential/Platelet  Thyroid nodule -     US THYROID; Future -     Thyroid Panel With TSH  Postmenopausal estrogen deficiency -     DG Bone Density; Future -     VITAMIN D 25 Hydroxy (Vit-D Deficiency, Fractures)  Vitamin D deficiency  Iron deficiency -     Ferritin  Conductive hearing loss of right ear with unrestricted hearing of left ear  Chronic fatigue     Getting Answers and Following Up  [x]   Simple Questions & Concerns: For quick questions or basic follow-up after your visit, reach [place] at [phone] or MyChart messaging. [x]   Complex Concerns: If your concern is more complex, scheduling an  appointment might be best. Discuss this with the staff to find the most suitable option. [x]   Lab & Imaging Results: We'll contact you directly if results are abnormal or you don't use MyChart. Most normal results will be on MyChart within 2-3 business days, with a review message from Dr. Jon Billings. Haven't heard back in 2 weeks? Need results sooner? Contact us at [phone]. [x]   Referrals: Our referral coordinator will manage specialist referrals. The specialist's office should contact you within 2 weeks to schedule an appointment. Call us if you haven't heard from them after 2 weeks.  Staying Connected  [x]   MyChart: Activate your MyChart for the fastest way to access results and message [place]. See the last page of this paperwork for instructions on how to activate.  Billing  [x]   X-ray & Lab Orders: These are billed by separate companies. Contact the invoicing company directly for questions or concerns. [x]   Visit Charges: Discuss any billing inquiries with our administrative services team.  Your Satisfaction Matters  [x]   Share Your Experience: We strive for your satisfaction! If you have any complaints, or preferably compliments, please let Dr. Jon Billings know directly or contact our Practice Administrators, Edwena Felty or [employer], by asking at the front desk.

## 2023-04-17 ENCOUNTER — Ambulatory Visit: Payer: Medicare Other | Admitting: Psychology

## 2023-04-17 DIAGNOSIS — F331 Major depressive disorder, recurrent, moderate: Secondary | ICD-10-CM

## 2023-04-17 LAB — CBC WITH DIFFERENTIAL/PLATELET
Basophils Absolute: 0.1 10*3/uL (ref 0.0–0.1)
Basophils Relative: 1.2 % (ref 0.0–3.0)
Eosinophils Absolute: 0.2 10*3/uL (ref 0.0–0.7)
Eosinophils Relative: 2.8 % (ref 0.0–5.0)
HCT: 42.7 % (ref 36.0–46.0)
Hemoglobin: 14.3 g/dL (ref 12.0–15.0)
Lymphocytes Relative: 23.3 % (ref 12.0–46.0)
Lymphs Abs: 1.4 10*3/uL (ref 0.7–4.0)
MCHC: 33.5 g/dL (ref 30.0–36.0)
MCV: 97.3 fL (ref 78.0–100.0)
Monocytes Absolute: 0.4 10*3/uL (ref 0.1–1.0)
Monocytes Relative: 6.7 % (ref 3.0–12.0)
Neutro Abs: 4.1 10*3/uL (ref 1.4–7.7)
Neutrophils Relative %: 66 % (ref 43.0–77.0)
Platelets: 190 10*3/uL (ref 150.0–400.0)
RBC: 4.39 Mil/uL (ref 3.87–5.11)
RDW: 13.1 % (ref 11.5–15.5)
WBC: 6.2 10*3/uL (ref 4.0–10.5)

## 2023-04-17 LAB — COMPREHENSIVE METABOLIC PANEL
ALT: 14 U/L (ref 0–35)
AST: 18 U/L (ref 0–37)
Albumin: 4.4 g/dL (ref 3.5–5.2)
Alkaline Phosphatase: 59 U/L (ref 39–117)
BUN: 18 mg/dL (ref 6–23)
CO2: 27 meq/L (ref 19–32)
Calcium: 9.2 mg/dL (ref 8.4–10.5)
Chloride: 106 meq/L (ref 96–112)
Creatinine, Ser: 0.94 mg/dL (ref 0.40–1.20)
GFR: 64.94 mL/min (ref >60.00)
Glucose, Bld: 84 mg/dL (ref 70–99)
Potassium: 4.2 meq/L (ref 3.5–5.1)
Sodium: 141 meq/L (ref 135–145)
Total Bilirubin: 0.4 mg/dL (ref 0.2–1.2)
Total Protein: 6.7 g/dL (ref 6.0–8.3)

## 2023-04-17 LAB — THYROID PANEL WITH TSH
Free Thyroxine Index: 1.7 (ref 1.4–3.8)
T3 Uptake: 31 % (ref 22–35)
T4, Total: 5.6 ug/dL (ref 5.1–11.9)
TSH: 1.78 m[IU]/L (ref 0.40–4.50)

## 2023-04-17 LAB — VITAMIN D 25 HYDROXY (VIT D DEFICIENCY, FRACTURES): VITD: 59.67 ng/mL (ref 30.00–100.00)

## 2023-04-17 LAB — LIPID PANEL
Cholesterol: 181 mg/dL (ref 0–200)
HDL: 60.9 mg/dL (ref >39.00)
LDL Cholesterol: 95 mg/dL (ref 0–99)
NonHDL: 120.25
Total CHOL/HDL Ratio: 3
Triglycerides: 124 mg/dL (ref 0.0–149.0)
VLDL: 24.8 mg/dL (ref 0.0–40.0)

## 2023-04-17 LAB — FERRITIN: Ferritin: 28.2 ng/mL (ref 10.0–291.0)
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
## 2023-04-19 ENCOUNTER — Ambulatory Visit: Payer: Medicare Other

## 2023-04-25 ENCOUNTER — Ambulatory Visit
Admission: RE | Admit: 2023-04-25 | Discharge: 2023-04-25 | Disposition: A | Discharge status: Home / Self Care | Source: Ambulatory Visit | Attending: Internal Medicine

## 2023-04-25 DIAGNOSIS — E041 Nontoxic single thyroid nodule: Secondary | ICD-10-CM

## 2023-04-25 DIAGNOSIS — R221 Localized swelling, mass and lump, neck: Secondary | ICD-10-CM | POA: Diagnosis not present

## 2023-04-26 ENCOUNTER — Ambulatory Visit: Payer: Medicare Other | Admitting: Behavioral Health

## 2023-04-29 ENCOUNTER — Other Ambulatory Visit: Payer: Self-pay | Admitting: Behavioral Health

## 2023-04-29 DIAGNOSIS — R454 Irritability and anger: Secondary | ICD-10-CM

## 2023-05-01 ENCOUNTER — Ambulatory Visit: Payer: Medicare Other | Admitting: Neurology

## 2023-05-01 ENCOUNTER — Encounter: Payer: Self-pay | Admitting: Neurology

## 2023-05-03 ENCOUNTER — Ambulatory Visit: Payer: Medicare Other

## 2023-05-03 IMAGING — CT CT ABD-PELV W/ CM
2 of 5 series · 15 of 46 positions shown, 17 images · IV contrast (APPLIED)
Comparison: 04/21/2019

CLINICAL DATA: Left lower quadrant abdominal pain radiating to the
pelvis beginning last night. History of diverticulosis.

EXAM:
CT ABDOMEN AND PELVIS WITH CONTRAST
TECHNIQUE: Multidetector CT imaging of the abdomen and pelvis was performed
using the standard protocol following bolus administration of
intravenous contrast.

[Series 2: abd pel w · axial · 0.65mm/px · z∈[-684,-259]mm · 12 of 95 slices shown, 14 images]
[im 5/95  soft-tissue]
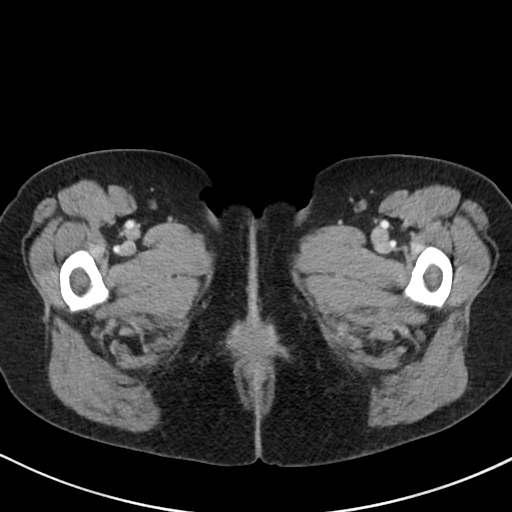
[im 5/95  bone]
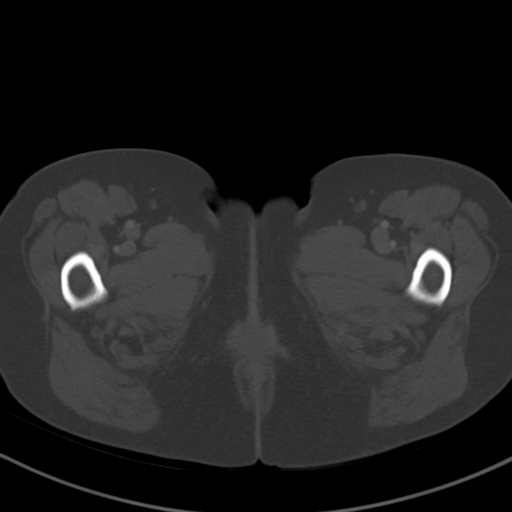
[im 15/95  soft-tissue]
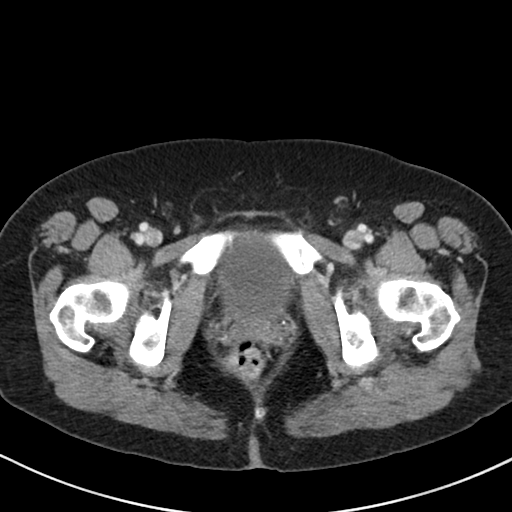
[im 20/95  soft-tissue]
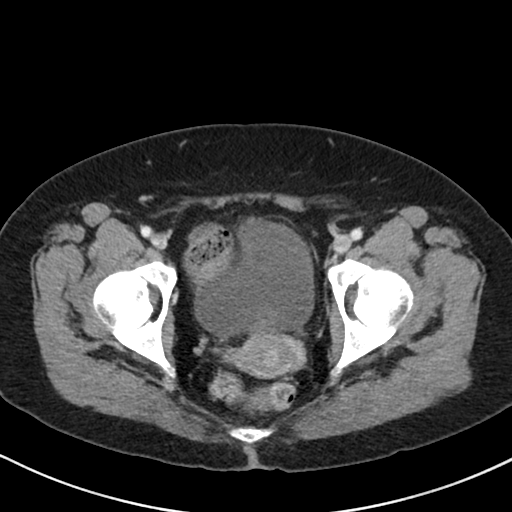
[im 30/95  soft-tissue]
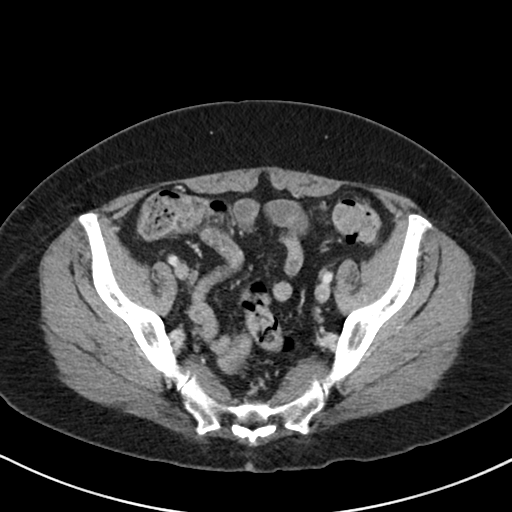
[im 35/95  soft-tissue]
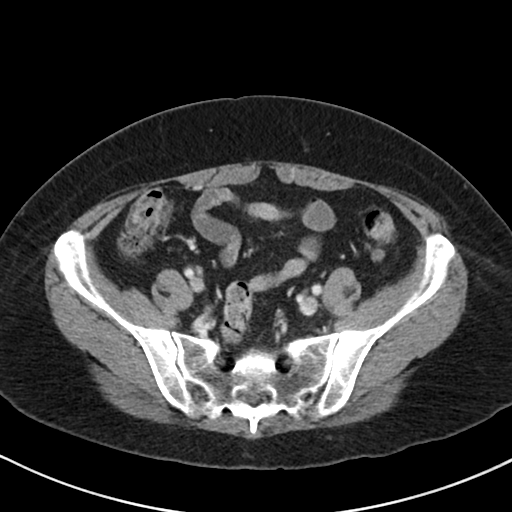
[im 45/95  soft-tissue]
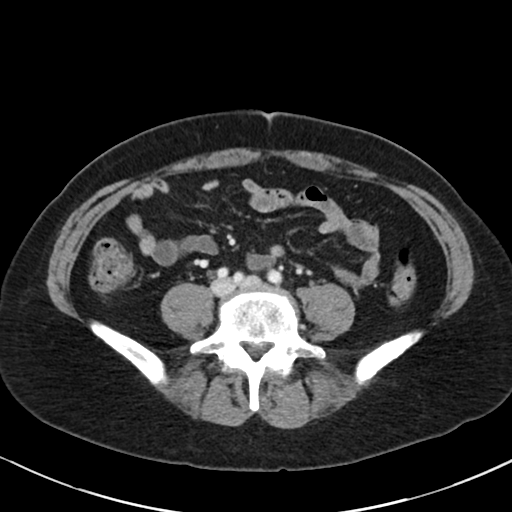
[im 50/95  soft-tissue]
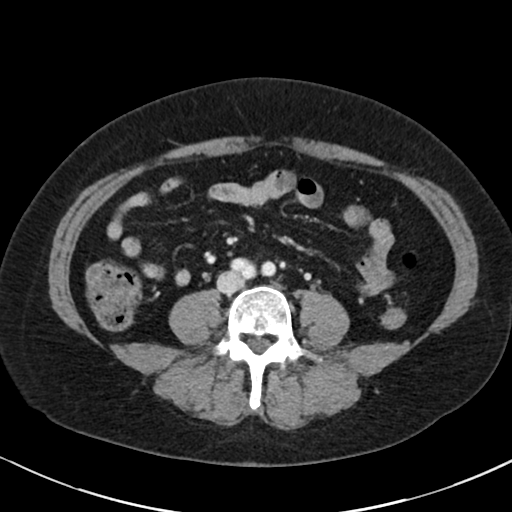
[im 60/95  soft-tissue]
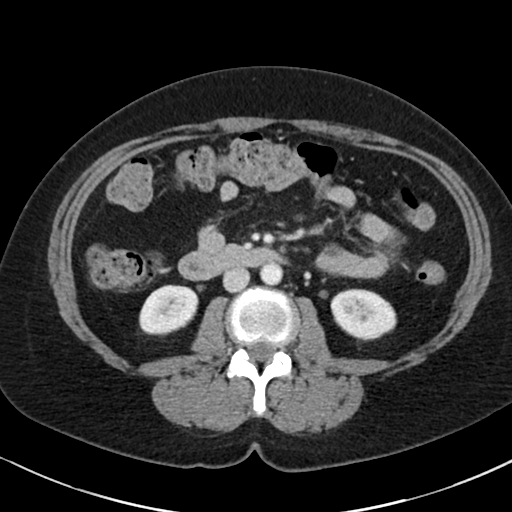
[im 65/95  soft-tissue]
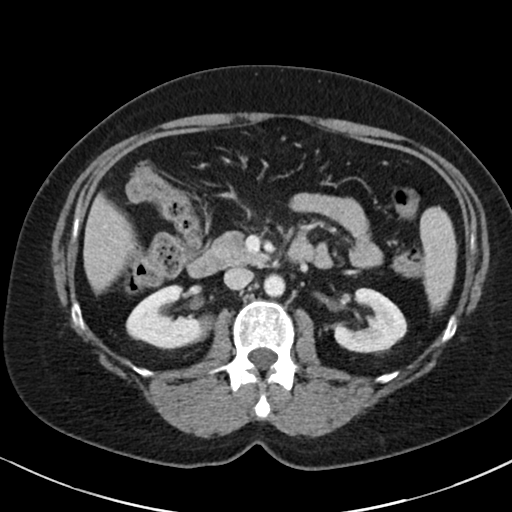
[im 65/95  bone]
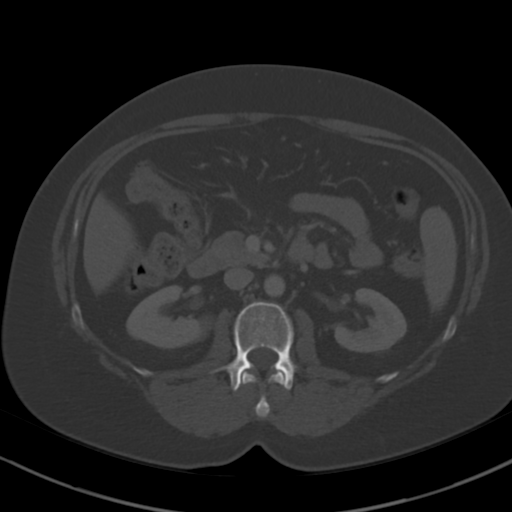
[im 75/95  soft-tissue]
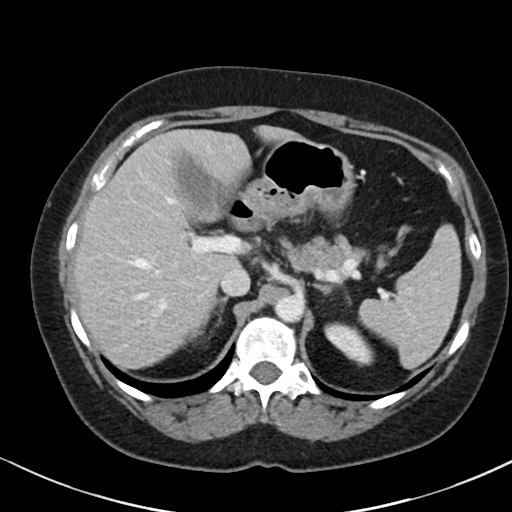
[im 80/95  soft-tissue]
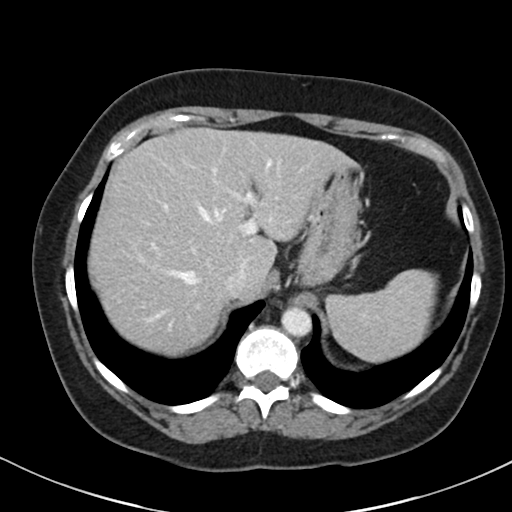
[im 90/95  soft-tissue]
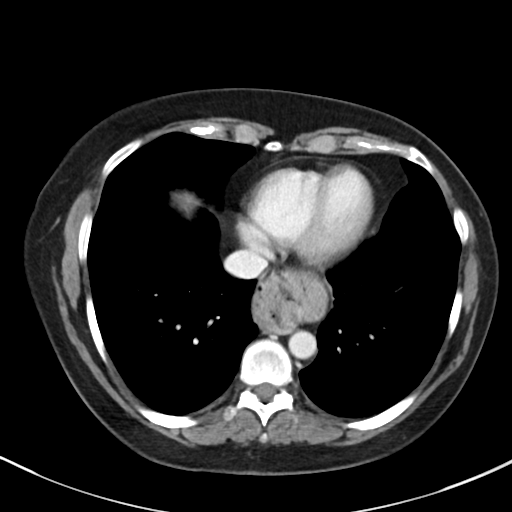

[Series 5: coronal · coronal · 0.64mm/px · 3 of 81 slices shown]
[im 27/81  soft-tissue]
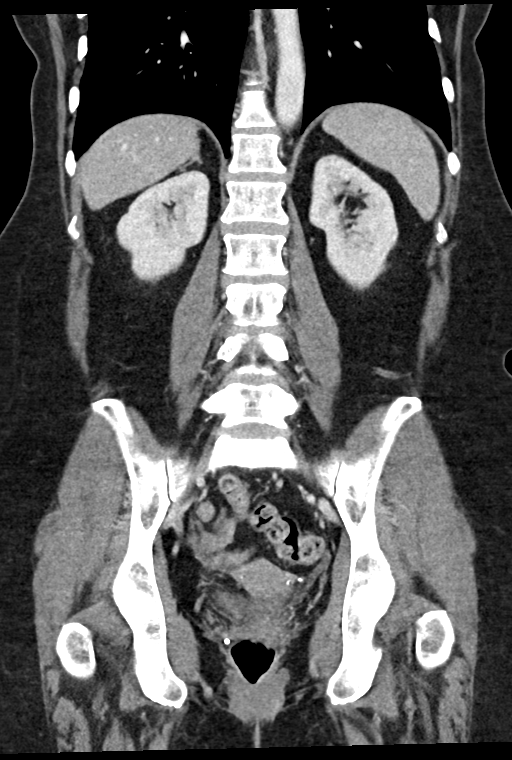
[im 36/81  soft-tissue]
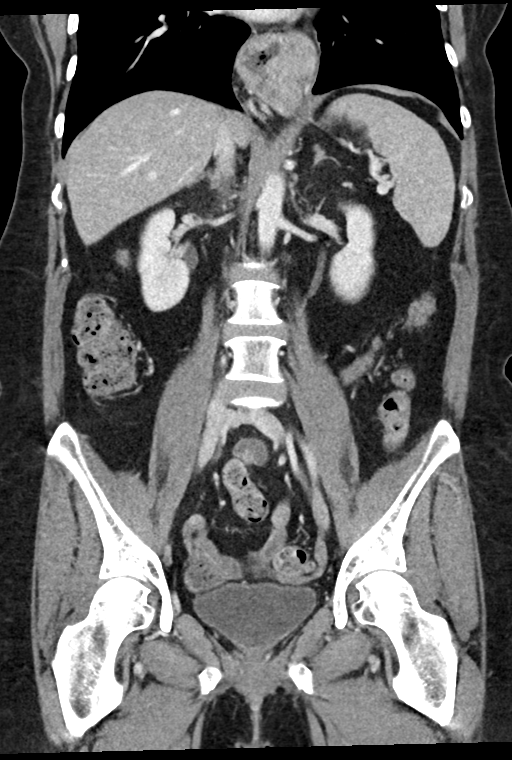
[im 45/81  soft-tissue]
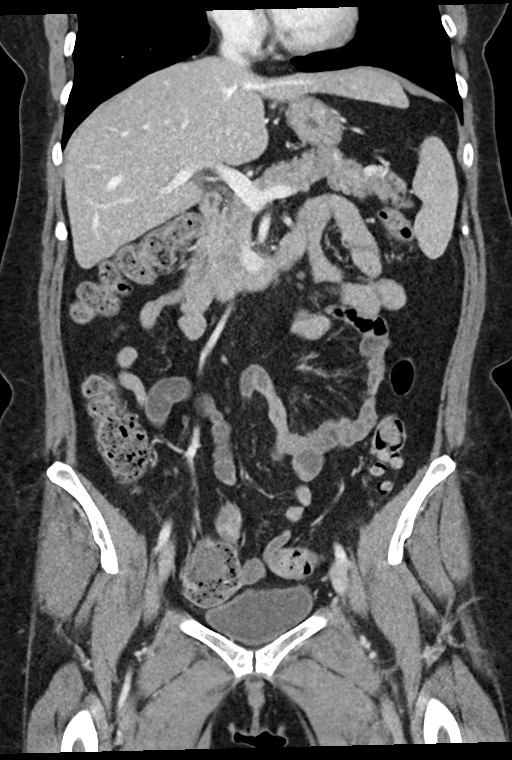

[15 of 46 positions shown; findings below may reference images not displayed]

RADIATION DOSE REDUCTION: This exam was performed according to the
departmental dose-optimization program which includes automated
exposure control, adjustment of the mA and/or kV according to
patient size and/or use of iterative reconstruction technique.

CONTRAST:  75mL OMNIPAQUE IOHEXOL 300 MG/ML  SOLN
FINDINGS: Lower chest: Hiatal hernia as seen previously. Lung bases are clear.

Hepatobiliary: Normal

Pancreas: Normal

Spleen: Normal

Adrenals/Urinary Tract: Adrenal glands are normal. Kidneys are
normal. Bladder is normal.

Stomach/Bowel: Stomach is normal except for the hiatal hernia. No
small bowel abnormality. Diverticulosis of the left colon without
visible diverticulitis.

Vascular/Lymphatic: Aorta and IVC are normal.  No adenopathy.

Reproductive: Normal

Other: No free fluid or air.

Musculoskeletal: Normal
IMPRESSION: No abnormality seen to explain the clinical presentation. The
patient does have diverticulosis of the left colon but there is no
visible diverticulitis. Low level diverticulitis can be inapparent
at imaging.

Hiatal hernia as seen previously without complicating feature.

## 2023-05-07 ENCOUNTER — Encounter: Payer: Self-pay | Admitting: Internal Medicine

## 2023-05-07 ENCOUNTER — Other Ambulatory Visit

## 2023-05-11 DIAGNOSIS — Z20822 Contact with and (suspected) exposure to covid-19: Secondary | ICD-10-CM | POA: Diagnosis not present

## 2023-05-11 DIAGNOSIS — R051 Acute cough: Secondary | ICD-10-CM | POA: Diagnosis not present

## 2023-05-13 ENCOUNTER — Other Ambulatory Visit: Payer: Self-pay | Admitting: Behavioral Health

## 2023-05-13 DIAGNOSIS — F900 Attention-deficit hyperactivity disorder, predominantly inattentive type: Secondary | ICD-10-CM

## 2023-05-13 DIAGNOSIS — F411 Generalized anxiety disorder: Secondary | ICD-10-CM

## 2023-05-13 DIAGNOSIS — F331 Major depressive disorder, recurrent, moderate: Secondary | ICD-10-CM

## 2023-05-13 NOTE — Telephone Encounter (Signed)
 Verify dose  Note says 50 mg

## 2023-05-14 ENCOUNTER — Encounter: Payer: Medicare Other | Admitting: Internal Medicine

## 2023-05-14 NOTE — Progress Notes (Deleted)
 [facility]  PATIENT: Jordan [name] DOB: Jun 28, 1960  REFERRING DOCTOR OR PCP:  *** SOURCE: ***  _________________________________   HISTORICAL  CHIEF COMPLAINT:  No chief complaint on file.   HISTORY OF PRESENT ILLNESS:  ***  She has seen multiple neurologists over the years. In 2009 she saw Dr. Sandria Manly after an MRI of the brain was performed for right-sided numbness in the arm and foot showing 2 T2/FLAIR hyperintense foci.  Evoked potentials, and CSF were normal.  Specifically she did not have oligoclonal bands.  She was then referred to Dr. Harriette Bouillon in August 2009 at [facility].  She had repeated MRIs over the next couple of years that were stable.  MRI of the cervical spine showed normal spinal cord.  It does not appear as though a firm diagnosis of MS was ever made.  She saw Dr. Everlena Cooper in 2014 for memory loss.  He felt symptoms were more likely to be due to depression.  She saw Dr. Ronalee Red, an MS specialist, at [facility] in April 2015.  He did not think she had MS.  He felt the changes in the brain were most consistent with small vessel change.  He felt symptoms may be due to fibromyalgia.  She saw Dr. Anne Hahn in 2015 through 2022.  He did not feel that she had MS.  Imaging: MRI of the brain 04/14/2013 shows a couple T2/FLAIR hyperintense foci in the periatrial white matter.  There is no atrophy.  This is very nonspecific and much more likely to be due to chronic microvascular ischemic changes into demyelination.  MRI of the cervical spine 04/14/2013 shows a normal spinal cord.  Some degenerative changes are noted.  At C4-C5 there is a central disc bulge but no nerve root compression or spinal stenosis.  At C5-C6, there is a broad disc protrusion and uncovertebral spurring causing mild spinal stenosis and right greater than left foraminal narrowing.  There is no definite nerve root compression  Lab work/other 07/12/07, including Lyme titer, ANA, ACE, SSA and  SSB-antibodies, lupus anticoagulant, and anti-cardiolipin antibodies were normal.   BAER and VEP performed 07/19/07 were normal.   LP was performed on 07/22/07, which revealed protein 32, glucose 56, one red blood cell, one WBC, ACE 4, VDRL NR, negative IgG index, negative CSF IgG, and negative oligoclonal bands.   EMG/NCV performed on 08/09/07 revealed evidence of carpal tunnel syndrome.   Neurocognitive testing by Dr. [name] on 04/16/12. Neurocognitive index was low, psychomotor speed was low, complex attention was low, composite memory was low average, visual memory was low average, reaction time was low average. Verbal memory, cognitive flexibility, processing speed and executive functioning was average. She exhibited marked to severe anxiety levels as well as moderate to severe depression. She had a recent hospitalization for suicidal ideation.   REVIEW OF SYSTEMS: Constitutional: No fevers, chills, sweats, or change in appetite Eyes: No visual changes, double vision, eye pain Ear, nose and throat: No hearing loss, ear pain, nasal congestion, sore throat Cardiovascular: No chest pain, palpitations Respiratory:  No shortness of breath at rest or with exertion.   No wheezes GastrointestinaI: No nausea, vomiting, diarrhea, abdominal pain, fecal incontinence Genitourinary:  No dysuria, urinary retention or frequency.  No nocturia. Musculoskeletal:  No neck pain, back pain Integumentary: No rash, pruritus, skin lesions Neurological: as above Psychiatric: No depression at this time.  No anxiety Endocrine: No palpitations, diaphoresis, change in appetite, change in weigh or increased thirst Hematologic/Lymphatic:  No  anemia, purpura, petechiae. Allergic/Immunologic: No itchy/runny eyes, nasal congestion, recent allergic reactions, rashes  ALLERGIES: Allergies  Allergen Reactions   Gabapentin Other (See Comments)   Mirtazapine Other (See Comments)    Severe hallucinations   Prednisone  Rash    Face redness and swollen per patient    Pregabalin Other (See Comments)    Delusions    HOME MEDICATIONS:  Current Outpatient Medications:    ALPRAZolam (XANAX) 0.5 MG tablet, TAKE ONE TABLET BY MOUTH TWICE A DAY AS NEEDED FOR ANXIETY, Disp: 60 tablet, Rfl: 5   busPIRone (BUSPAR) 30 MG tablet, TAKE 1 TABLET BY MOUTH 2 TIMES A DAY, Disp: 60 tablet, Rfl: 3   Cholecalciferol (VITAMIN D3) 125 MCG (5000 UT) CAPS, Take 1 capsule (5,000 Units total) by mouth daily., Disp: 90 capsule, Rfl: 3   dicyclomine (BENTYL) 10 MG capsule, TAKE ONE CAPSULE BY MOUTH EVERY 6 HOURS AS NEEDED FOR ABDOMINAL PAIN, Disp: 30 capsule, Rfl: 1   DULoxetine (CYMBALTA) 60 MG capsule, TAKE 1 CAPSULE BY MOUTH TWICE  DAILY, Disp: 200 capsule, Rfl: 1   esomeprazole (NEXIUM) 40 MG capsule, TAKE 1 CAPSULE BY MOUTH TWICE  DAILY BEFORE MEALS, Disp: 200 capsule, Rfl: 1   fluticasone (FLONASE) 50 MCG/ACT nasal spray, SPRAY 1 SPRAY IN EACH NOSTRIL ONCE DAILY AS NEEDED FOR ALLERGIES OR RHINITIS, Disp: 16 mL, Rfl: 2   lamoTRIgine (LAMICTAL) 25 MG tablet, TAKE 1 TABLET BY MOUTH 2 TIMES A DAY, Disp: 60 tablet, Rfl: 0   tiZANidine (ZANAFLEX) 2 MG tablet, TAKE 1 TABLET BY MOUTH 3 TIMES A DAY AS NEEDED FOR PAIN/SPASM, Disp: , Rfl:    traZODone (DESYREL) 50 MG tablet, TAKE TWO TABLETS BY MOUTH AT BEDTIME, Disp: 60 tablet, Rfl: 2  PAST MEDICAL HISTORY: Past Medical History:  Diagnosis Date   Acute upper respiratory infections of unspecified site    ADD (attention deficit disorder)    Allergic rhinitis due to pollen    Anal fissure    Anemia, unspecified    Anxiety disorder    Asthma    Carpal tunnel syndrome 04/25/2007   Qualifier: Diagnosis of   By: Debby Bud MD, Rosalyn Gess        Chicken pox    Complication of anesthesia    wakes up slow   Cough 07/03/2011   Followed in Pulmonary clinic/ [facility]/ [place]      - Spirometry nl during flare 07/03/2011      Had severe asthmatic bronchitis vs pneumonia requiring  hospitalization April '13 for severe bronchospasm. Subsequently had persistent cough and bronchospasm for several months.      Depression, recurrent (HCC) 12/05/2006   Qualifier: Diagnosis of   By: Roxan Hockey CMA, Jessica      History or severe depression - followed by Dr. Evelene Croon  Additional diagnosis: ADD, agraphobia, self-mutilation.  Med added - abilify     Depressive disorder, not elsewhere classified    Diffuse cystic mastopathy    Falls    Fibromyalgia    GERD (gastroesophageal reflux disease)    Headache(784.0)    History of anorexia nervosa    History of bulimia    IBS (irritable bowel syndrome)    IGT (impaired glucose tolerance) 03/24/2021   Lab Results      Component    Value    Date           HGBA1C    5.3    04/13/2022           Incontinence without sensory  awareness 06/01/2009   Qualifier: Diagnosis of   By: Debby Bud MD, Rosalyn Gess        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    Iron deficiency anemia 03/24/2021   Lump or mass in breast    Meningitis, unspecified(322.9)    NASAL POLYP 05/14/2009   NECK PAIN, CHRONIC 02/19/2007   Qualifier: Diagnosis of   By: Debby Bud MD, Rosalyn Gess        Oral thrush    Other specified glaucoma    Perforation of tympanic membrane, unspecified    Radial styloid tenosynovitis    Seizures (HCC)    TYMPANIC MEMBRANE PERFORATION 12/05/2006   Annotation: REPAIRED  Qualifier: Diagnosis of   By: Roxan Hockey CMA, Jessica      Replacing diagnoses that were inactivated after the 05/22/22 regulatory import      PAST SURGICAL HISTORY: Past Surgical History:  Procedure Laterality Date   ADENOIDECTOMY     ANKLE SURGERY  1992   right ankle - scar tissue   DILATION AND CURETTAGE OF UTERUS  2006   History of Menometrorrhagia   INNER EAR SURGERY  1966 to 1986   6 surgeries on right ear   LAPAROSCOPY  1993   normal   REDUCTION MAMMAPLASTY Bilateral 2018   uterine ablation  2011   WRIST SURGERY  2001   for de-quervaine - bilateral wrists - tendonitis    FAMILY  HISTORY: Family History  Problem Relation Age of Onset   Dementia Mother    Alcohol abuse Mother    Ovarian cancer Mother    Heart disease Mother    Hypertension Mother    Coronary artery disease Mother    Colon polyps Mother 77   Thyroid disease Mother    Heart failure Father    Hypertension Father    Hyperlipidemia Father    Diabetes Father    Alcohol abuse Sister    Breast cancer Maternal Grandmother    Alcohol abuse Maternal Grandfather    Anxiety disorder Maternal Aunt    Depression Maternal Aunt    Alcohol abuse Maternal Aunt    Alcohol abuse Maternal Uncle    Colon cancer Neg Hx    Esophageal cancer Neg Hx    Stomach cancer Neg Hx    Rectal cancer Neg Hx     SOCIAL HISTORY: Social History   Socioeconomic History   Marital status: Divorced    Spouse name: Not on file   Number of children: 0   Years of education: Masters   Highest education level: Master's degree (e.g., MA, MS, MEng, MEd, MSW, MBA)  Occupational History   Occupation: part time Airline pilot   Occupation: disability  Tobacco Use   Smoking status: Never   Smokeless tobacco: Never  Vaping Use   Vaping status: Never Used  Substance and Sexual Activity   Alcohol use: Yes    Alcohol/week: 7.0 standard drinks of alcohol    Types: 7 Glasses of wine per week    Comment: 2 glasses of wine per night   Drug use: No   Sexual activity: Not Currently  Other Topics Concern   Not on file  Social History Narrative   HS[employer], [employer] BA, App for MA. 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 Married 5 years - divorced, no children   Patient is right handed   Patient drinks 1 cup of coffee daily.  Social Drivers of Health   Financial Resource Strain: Medium Risk (04/09/2023)   Overall Financial Resource Strain (CARDIA)    Difficulty of Paying Living Expenses: Somewhat hard  Food Insecurity: Food Insecurity  Present (04/09/2023)   Hunger Vital Sign    Worried About Running Out of Food in the Last Year: Sometimes true    Ran Out of Food in the Last Year: Sometimes true  Transportation Needs: No Transportation Needs (04/09/2023)   PRAPARE - Administrator, Civil Service (Medical): No    Lack of Transportation (Non-Medical): No  Physical Activity: Sufficiently Active (04/09/2023)   Exercise Vital Sign    Days of Exercise per Week: 6 days    Minutes of Exercise per Session: 30 min  Stress: Stress Concern Present (04/09/2023)   [employer] of Occupational Health - Occupational Stress Questionnaire    Feeling of Stress : Rather much  Social Connections: Socially Isolated (04/09/2023)   Social Connection and Isolation Panel [NHANES]    Frequency of Communication with Friends and Family: Twice a week    Frequency of Social Gatherings with Friends and Family: Once a week    Attends Religious Services: Never    Database administrator or Organizations: No    Attends Engineer, structural: Not on file    Marital Status: Divorced  Intimate Partner Violence: Not At Risk (04/12/2022)   Humiliation, Afraid, Rape, and Kick questionnaire    Fear of Current or Ex-Partner: No    Emotionally Abused: No    Physically Abused: No    Sexually Abused: No       PHYSICAL EXAM  There were no vitals filed for this visit.  There is no height or weight on file to calculate BMI.   General: The patient is well-developed and well-nourished and in no acute distress  HEENT:  Head is [place]/AT.  Sclera are anicteric.  Funduscopic exam shows normal optic discs and retinal vessels.  Neck: No carotid bruits are noted.  The neck is nontender.  Cardiovascular: The heart has a regular rate and rhythm with a normal S1 and S2. There were no murmurs, gallops or rubs.    Skin: Extremities are without rash or  edema.  Musculoskeletal:  Back is nontender  Neurologic Exam  Mental status: The patient  is alert and oriented x 3 at the time of the examination. The patient has apparent normal recent and remote memory, with an apparently normal attention span and concentration ability.   Speech is normal.  Cranial nerves: Extraocular movements are full. Pupils are equal, round, and reactive to light and accomodation.  Visual fields are full.  Facial symmetry is present. There is good facial sensation to soft touch bilaterally.Facial strength is normal.  Trapezius and sternocleidomastoid strength is normal. No dysarthria is noted.  The tongue is midline, and the patient has symmetric elevation of the soft palate. No obvious hearing deficits are noted.  Motor:  Muscle bulk is normal.   Tone is normal. Strength is  5 / 5 in all 4 extremities.   Sensory: Sensory testing is intact to pinprick, soft touch and vibration sensation in all 4 extremities.  Coordination: Cerebellar testing reveals good finger-nose-finger and heel-to-shin bilaterally.  Gait and station: Station is normal.   Gait is normal. Tandem gait is normal. Romberg is negative.   Reflexes: Deep tendon reflexes are symmetric and normal bilaterally.   Plantar responses are flexor.    DIAGNOSTIC DATA (LABS, IMAGING, TESTING) - I reviewed  patient records, labs, notes, testing and imaging myself where available.  Lab Results  Component Value Date   WBC 6.2 04/16/2023   HGB 14.3 04/16/2023   HCT 42.7 04/16/2023   MCV 97.3 04/16/2023   PLT 190.0 04/16/2023      Component Value Date/Time   NA 141 04/16/2023 1436   K 4.2 04/16/2023 1436   CL 106 04/16/2023 1436   CO2 27 04/16/2023 1436   GLUCOSE 84 04/16/2023 1436   BUN 18 04/16/2023 1436   CREATININE 0.94 04/16/2023 1436   CREATININE 0.95 10/22/2020 1500   CALCIUM 9.2 04/16/2023 1436   PROT 6.7 04/16/2023 1436   ALBUMIN 4.4 04/16/2023 1436   AST 18 04/16/2023 1436   ALT 14 04/16/2023 1436   ALKPHOS 59 04/16/2023 1436   BILITOT 0.4 04/16/2023 1436   GFRNONAA >60 07/20/2021  1203   GFRAA >90 08/28/2012 2359   Lab Results  Component Value Date   CHOL 181 04/16/2023   HDL 60.90 04/16/2023   LDLCALC 95 04/16/2023   LDLDIRECT 119.0 03/21/2021   TRIG 124.0 04/16/2023   CHOLHDL 3 04/16/2023   Lab Results  Component Value Date   HGBA1C 5.3 04/13/2022   Lab Results  Component Value Date   VITAMINB12 611 04/13/2022   Lab Results  Component Value Date   TSH 1.78 04/16/2023       ASSESSMENT AND PLAN  ***   Lovie Agresta A. Epimenio Foot, MD, [facility] 05/14/2023, 8:16 PM Certified in Neurology, Clinical Neurophysiology, Sleep Medicine and Neuroimaging  [facility] [address], [address] [place], [place] [address] [phone]

## 2023-05-16 ENCOUNTER — Ambulatory Visit: Admitting: Neurology

## 2023-05-21 ENCOUNTER — Ambulatory Visit: Payer: Medicare Other | Admitting: Psychology

## 2023-05-22 ENCOUNTER — Ambulatory Visit: Admitting: Behavioral Health

## 2023-05-22 ENCOUNTER — Encounter: Payer: Self-pay | Admitting: Behavioral Health

## 2023-05-22 DIAGNOSIS — F331 Major depressive disorder, recurrent, moderate: Secondary | ICD-10-CM | POA: Diagnosis not present

## 2023-05-22 DIAGNOSIS — R454 Irritability and anger: Secondary | ICD-10-CM | POA: Diagnosis not present

## 2023-05-22 DIAGNOSIS — F900 Attention-deficit hyperactivity disorder, predominantly inattentive type: Secondary | ICD-10-CM

## 2023-05-22 DIAGNOSIS — F411 Generalized anxiety disorder: Secondary | ICD-10-CM

## 2023-05-22 MED ORDER — LAMOTRIGINE 25 MG PO TABS: 25.0000 mg | ORAL_TABLET | Freq: Two times a day (BID) | ORAL | 0 refills | Status: DC

## 2023-05-22 MED ORDER — BUSPIRONE HCL 30 MG PO TABS: 30.0000 mg | ORAL_TABLET | Freq: Two times a day (BID) | ORAL | 3 refills | Status: DC

## 2023-05-22 MED ORDER — TRAZODONE HCL 50 MG PO TABS: 100.0000 mg | ORAL_TABLET | Freq: Every day | ORAL | 3 refills | Status: DC

## 2023-05-22 MED ORDER — ALPRAZOLAM 0.5 MG PO TABS: ORAL_TABLET | ORAL | 5 refills | Status: DC

## 2023-05-22 NOTE — Progress Notes (Signed)
 [facility]  Patient ID: KIYLA RINGLER,  MRN: [id number]  PCP: [name], MD  Date of Evaluation: 05/22/2023 Time spent:30 minutes  Chief Complaint:  Chief Complaint   Depression; Anxiety; Follow-up; Medication Refill; Patient Education     HISTORY/CURRENT STATUS: HPI  63 year old patient presents to this office face to face visit for  follow up and medication management. She says she is "doing ok" this visit. She is back from her extended [place] trip. She is feeling very stable right now. Reporting no depression. Says her depression today is 0/10 and anxiety 2/10. She is sleeping 7-8 hours per night. She denies any current or prior mania.No psychosis, no auditory or visual hallucinations. Strong family hx of dementia. No SI or HI.   Prior psychiatric medication trials:   Abilify Latuda Risperdone Amitriptyline Trintellix Lexapro Prozac Zoloft Paxil Wellbutrin Remeron Effexor Cymbalta Rexulti   Individual Medical History/ Review of Systems: Changes? :No   Allergies: Gabapentin, Mirtazapine, Prednisone, and Pregabalin  Current Medications:  Current Outpatient Medications:    ALPRAZolam (XANAX) 0.5 MG tablet, TAKE ONE TABLET BY MOUTH TWICE A DAY AS NEEDED FOR ANXIETY, Disp: 60 tablet, Rfl: 5   busPIRone (BUSPAR) 30 MG tablet, Take 1 tablet (30 mg total) by mouth 2 (two) times daily., Disp: 60 tablet, Rfl: 3   Cholecalciferol (VITAMIN D3) 125 MCG (5000 UT) CAPS, Take 1 capsule (5,000 Units total) by mouth daily., Disp: 90 capsule, Rfl: 3   dicyclomine (BENTYL) 10 MG capsule, TAKE ONE CAPSULE BY MOUTH EVERY 6 HOURS AS NEEDED FOR ABDOMINAL PAIN, Disp: 30 capsule, Rfl: 1   DULoxetine (CYMBALTA) 60 MG capsule, TAKE 1 CAPSULE BY MOUTH TWICE  DAILY, Disp: 200 capsule, Rfl: 1   esomeprazole (NEXIUM) 40 MG capsule, TAKE 1 CAPSULE BY MOUTH TWICE  DAILY BEFORE MEALS, Disp: 200 capsule, Rfl: 1   fluticasone (FLONASE) 50 MCG/ACT nasal spray, SPRAY 1 SPRAY IN  EACH NOSTRIL ONCE DAILY AS NEEDED FOR ALLERGIES OR RHINITIS, Disp: 16 mL, Rfl: 2   lamoTRIgine (LAMICTAL) 25 MG tablet, Take 1 tablet (25 mg total) by mouth 2 (two) times daily., Disp: 60 tablet, Rfl: 0   tiZANidine (ZANAFLEX) 2 MG tablet, TAKE 1 TABLET BY MOUTH 3 TIMES A DAY AS NEEDED FOR PAIN/SPASM, Disp: , Rfl:    traZODone (DESYREL) 50 MG tablet, Take 2 tablets (100 mg total) by mouth at bedtime., Disp: 60 tablet, Rfl: 3 Medication Side Effects: none  Family Medical/ Social History: Changes? No  MENTAL HEALTH EXAM:  There were no vitals taken for this visit.There is no height or weight on file to calculate BMI.  General Appearance: Casual and Well Groomed  Eye Contact:  NA  Speech:  Clear and Coherent  Volume:  Normal  Mood:  NA  Affect:  Appropriate  Thought Process:  Coherent  Orientation:  Full (Time, Place, and Person)  Thought Content: Logical   Suicidal Thoughts:  No  Homicidal Thoughts:  No  Memory:  WNL  Judgement:  Good  Insight:  Good  Psychomotor Activity:  Normal  Concentration:  Concentration: Good  Recall:  Good  Fund of Knowledge: Good  Language: Good  Assets:  Desire for Improvement  ADL's:  Intact  Cognition: WNL  Prognosis:  Good    DIAGNOSES:    ICD-10-CM   1. Major depressive disorder, recurrent episode, moderate (HCC)  F33.1 busPIRone (BUSPAR) 30 MG tablet    traZODone (DESYREL) 50 MG tablet    2. Generalized anxiety disorder  F41.1 busPIRone (BUSPAR) 30 MG tablet    ALPRAZolam (XANAX) 0.5 MG tablet    traZODone (DESYREL) 50 MG tablet    3. Irritability  R45.4 ALPRAZolam (XANAX) 0.5 MG tablet    lamoTRIgine (LAMICTAL) 25 MG tablet    4. Attention deficit hyperactivity disorder (ADHD), predominantly inattentive type  F90.0 traZODone (DESYREL) 50 MG tablet      Receiving Psychotherapy: No    RECOMMENDATIONS: Greater than 50% of 30 face to face time with patient was spent on counseling and coordination of care. We discussed her current  stability. Smiling today. Had a good time in [place] with friend. Request no changes to medication this visit.    We agreed to:   Will continue Lamictal 50 mg daily Will continue Cymbalta 60 mg twice daily To continue Trazodone 50 mg at bedtime as reported by pt Continue Buspar to 30 mg twice daily, at least 7-8 hours between doses. Continue Xanax 0.5 mg twice daily for severe anxiety only.  To report worsening symptoms or side effects promptly Will follow up in 3 months to reassess Provided emergency contact information Discussed potential metabolic side effects associated with atypical antipsychotics, as well as potential risk for movement side effects. Advised pt to contact office if movement side effects occur.   Reviewed PDMP  Joan Flores, NP

## 2023-05-24 ENCOUNTER — Ambulatory Visit (INDEPENDENT_AMBULATORY_CARE_PROVIDER_SITE_OTHER): Admitting: Audiology

## 2023-05-24 ENCOUNTER — Ambulatory Visit (INDEPENDENT_AMBULATORY_CARE_PROVIDER_SITE_OTHER): Admitting: Physician Assistant

## 2023-05-24 DIAGNOSIS — H9201 Otalgia, right ear: Secondary | ICD-10-CM

## 2023-05-24 DIAGNOSIS — H90A31 Mixed conductive and sensorineural hearing loss, unilateral, right ear with restricted hearing on the contralateral side: Secondary | ICD-10-CM | POA: Diagnosis not present

## 2023-05-24 DIAGNOSIS — H9042 Sensorineural hearing loss, unilateral, left ear, with unrestricted hearing on the contralateral side: Secondary | ICD-10-CM | POA: Diagnosis not present

## 2023-05-24 NOTE — Progress Notes (Signed)
  [address], [address] [place], [place] [address] [phone]  Audiological Evaluation    Name: Jordan [name]     DOB:   August 21, 1960      MRN:   [id number]                                                                                     Service Date: 05/24/2023     Accompanied by: unaccompanied   Patient comes today after Eyvonne Mechanic, PA-C sent a referral for a hearing evaluation due to concerns with ear pain.   Symptoms Yes Details  Hearing loss  [x]  Reports difficulty hearing others. Reports right side hearing is worse than the left  Tinnitus  []    Ear pain/ infections/pressure  [x]  Right ear pain /pressure, seems better today  Balance problems  [x]  Denied spinning  Noise exposure history  []    Previous ear surgeries  [x]  Reports several right ear middle ear surgeries.  Family history of hearing loss  []    Amplification  []    Other  [x]  Noted some intolerance to sounds at threshold level in the right ear.    Otoscopy: Right ear: Abnormal eardrum appearance. Left ear:  Clear external ear canals and notable landmarks visualized on the tympanic membrane.  Tympanometry: Right ear: Type Ad- Normal external ear canal volume with normal middle ear pressure and high tympanic membrane compliance Left ear: Type A- Normal external ear canal volume with normal middle ear pressure and tympanic membrane compliance  Pure tone Audiometry: Right ear- Mild to profound mixed hearing loss from 125 Hz - 8000 Hz. Left ear-  Borderline normal to moderate sensorinueral hearing loss from [phone].    Speech Audiometry: Right ear- Speech Reception Threshold (SRT) was obtained at 40 dBHL. Left ear-Speech Reception Threshold (SRT) was obtained at 25 dBHL.   Word Recognition Score Tested using NU-6 (MLV) Right ear: 96% was obtained at a presentation level of 80 dBHL with contralateral masking which is deemed as  excellent. Left ear: 96% was obtained at a presentation level of 60 dBHL  with contralateral masking which is deemed as  excellent.   The hearing test results were completed under headphones (inserts for [facility] masking) and results are deemed to be of good reliability. Test technique:  conventional      Recommendations: Follow up with ENT as scheduled for today. Return for a hearing evaluation if concerns with hearing changes arise or per MD recommendation. Consider a communication needs assessment after medical clearance for hearing aids is obtained.   Panfilo Ketchum MARIE LEROUX-MARTINEZ, AUD

## 2023-05-24 NOTE — Patient Instructions (Signed)
 I have ordered an imaging study for you to complete prior to your next visit. Please call [facility] Scheduling at [phone] to schedule your imaging if you have not received a call within 24 hours. If you are unable to complete your imaging study prior to your next scheduled visit please call our office to let us know.    Please follow up two weeks after CT is complete.

## 2023-05-24 NOTE — Progress Notes (Unsigned)
 Dear Dr. Jon Billings, Here is my assessment for our mutual patient, Jordan [name]. Thank you for allowing me the opportunity to care for your patient. Please do not hesitate to contact me should you have any other questions. Sincerely, Burna Forts PA-C  Otolaryngology Clinic Note Referring provider: Dr. Jon Billings HPI:  Jordan [name] is a 63 y.o. female kindly referred by Dr. Jon Billings   The patient is a 63 year old female presenting today with complaints of right sided ear pain.  The patient notes a significant past medical history of numerous right sided ear surgeries.  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  She had adenoidectomy, eardrum repair, she reports never having tubes.  At the age of 30 she was told that she had a bone in her canal that needed to be repaired as well as surgery of the "ear bones".  She notes she again had a another surgery which she is uncertain what it was at the age of 35 and was told she would need more thereafter.  She did not have any further surgeries.  She notes that she has been followed by ENTs over the years.  Most recently she was seen at the [facility] on Jun 29, 2022, at that time no signs of infection but ongoing right-sided ear complaints.  That since that time she has had intermittent infections of the right ear, she notes that she has been to urgent care 3 times and diagnosed with otitis media, this resulted in the use of antibiotics which clears the infection.  She notes that she does have pain, sometimes she has more difficulty hearing out of the ear, she notes at baseline she has decreased hearing out of the right ear.  She denies any drainage, she also notes some additional pain behind the ear, associated dizziness which is random and worse with head movements.   Previous seen ENT providers include Dr. Haroldine Laws, Dr. Ezzard Standing, Dr.Inabinet, Scot Jun PA-C,     H&N Surgery: Reported 6 right sided ear  surgeries, uncertain as to what they were but notes she did have tympanoplasty   Independent Review of Additional Tests or Records:  ENT visits from May 2024   PMH/Meds/All/SocHx/FamHx/ROS:   Past Medical History:  Diagnosis Date   Acute upper respiratory infections of unspecified site    ADD (attention deficit disorder)    Allergic rhinitis due to pollen    Anal fissure    Anemia, unspecified    Anxiety disorder    Asthma    Carpal tunnel syndrome 04/25/2007   Qualifier: Diagnosis of   By: Debby Bud MD, Rosalyn Gess        Chicken pox    Complication of anesthesia    wakes up slow   Cough 07/03/2011   Followed in Pulmonary clinic/ [facility]/ [place]      - Spirometry nl during flare 07/03/2011      Had severe asthmatic bronchitis vs pneumonia requiring hospitalization April '13 for severe bronchospasm. Subsequently had persistent cough and bronchospasm for several months.      Depression, recurrent (HCC) 12/05/2006   Qualifier: Diagnosis of   By: Roxan Hockey CMA, Jessica      History or severe depression - followed by Dr. Evelene Croon  Additional diagnosis: ADD, agraphobia, self-mutilation.  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    Iron deficiency anemia 03/24/2021   Lump or mass in breast    Meningitis, unspecified(322.9)    NASAL POLYP 05/14/2009   NECK PAIN, CHRONIC 02/19/2007   Qualifier: Diagnosis of   By: Debby Bud MD, Rosalyn Gess        Oral thrush    Other specified glaucoma     Perforation of tympanic membrane, unspecified    Radial styloid tenosynovitis    Seizures (HCC)    TYMPANIC MEMBRANE PERFORATION 12/05/2006   Annotation: REPAIRED  Qualifier: Diagnosis of   By: Roxan Hockey CMA, Jessica      Replacing diagnoses that were inactivated after the 05/22/22 regulatory import       Past Surgical History:  Procedure Laterality Date   ADENOIDECTOMY     ANKLE SURGERY  1992   right ankle - scar tissue   DILATION AND CURETTAGE OF UTERUS  2006   History of Menometrorrhagia   INNER EAR SURGERY  1966 to 1986   6 surgeries on right ear   LAPAROSCOPY  1993   normal   REDUCTION MAMMAPLASTY Bilateral 2018   uterine ablation  2011   WRIST SURGERY  2001   for de-quervaine - bilateral wrists - tendonitis    Family History  Problem Relation Age of Onset   Dementia Mother    Alcohol abuse Mother    Ovarian cancer Mother    Heart disease Mother    Hypertension Mother    Coronary artery disease Mother    Colon polyps Mother 65   Thyroid disease Mother    Heart failure Father    Hypertension Father    Hyperlipidemia Father    Diabetes Father    Alcohol abuse Sister    Breast cancer Maternal Grandmother    Alcohol abuse Maternal Grandfather    Anxiety disorder Maternal Aunt    Depression Maternal Aunt    Alcohol abuse Maternal Aunt    Alcohol abuse Maternal Uncle    Colon cancer Neg Hx    Esophageal cancer Neg Hx    Stomach cancer Neg Hx    Rectal cancer Neg Hx      Social Connections: Socially Isolated (04/09/2023)   Social Connection and Isolation Panel [NHANES]    Frequency of Communication with Friends and Family: Twice a week    Frequency of Social Gatherings with Friends and Family: Once a week    Attends Religious Services: Never    Database administrator or Organizations: No    Attends Engineer, structural: Not on file    Marital Status: Divorced      Current Outpatient Medications:    ALPRAZolam (XANAX) 0.5 MG tablet, TAKE ONE TABLET  BY MOUTH TWICE A DAY AS NEEDED FOR ANXIETY, Disp: 60 tablet, Rfl: 5   busPIRone (BUSPAR) 30 MG tablet, Take 1 tablet (30 mg total)  by mouth 2 (two) times daily., Disp: 60 tablet, Rfl: 3   Cholecalciferol (VITAMIN D3) 125 MCG (5000 UT) CAPS, Take 1 capsule (5,000 Units total) by mouth daily., Disp: 90 capsule, Rfl: 3   dicyclomine (BENTYL) 10 MG capsule, TAKE ONE CAPSULE BY MOUTH EVERY 6 HOURS AS NEEDED FOR ABDOMINAL PAIN, Disp: 30 capsule, Rfl: 1   DULoxetine (CYMBALTA) 60 MG capsule, TAKE 1 CAPSULE BY MOUTH TWICE  DAILY, Disp: 200 capsule, Rfl: 1   esomeprazole (NEXIUM) 40 MG capsule, TAKE 1 CAPSULE BY MOUTH TWICE  DAILY BEFORE MEALS, Disp: 200 capsule, Rfl: 1   fluticasone (FLONASE) 50 MCG/ACT nasal spray, SPRAY 1 SPRAY IN EACH NOSTRIL ONCE DAILY AS NEEDED FOR ALLERGIES OR RHINITIS, Disp: 16 mL, Rfl: 2   lamoTRIgine (LAMICTAL) 25 MG tablet, Take 1 tablet (25 mg total) by mouth 2 (two) times daily., Disp: 60 tablet, Rfl: 0   tiZANidine (ZANAFLEX) 2 MG tablet, TAKE 1 TABLET BY MOUTH 3 TIMES A DAY AS NEEDED FOR PAIN/SPASM, Disp: , Rfl:    traZODone (DESYREL) 50 MG tablet, Take 2 tablets (100 mg total) by mouth at bedtime., Disp: 60 tablet, Rfl: 3   Physical Exam:   There were no vitals taken for this visit.  Pertinent Findings  CN II-XII intact *** Bilateral EAC clear and TM intact with well pneumatized middle ear spaces Weber 512: equal Rinne 512: AC > BC b/l  Anterior rhinoscopy: Septum ***; bilateral inferior turbinates with *** No lesions of oral cavity/oropharynx; dentition *** No obviously palpable neck masses/lymphadenopathy/thyromegaly No respiratory distress or stridor  A gb  Localizes left   Seprately Identifiable Procedures:  None***  Impression & Plans:  Jordan [name] is a 63 y.o. female with the following   ***   - f/u ***  Floanse and allegra    Thank you for allowing me the opportunity to care for your patient. Please do not hesitate to contact me should you  have any other questions.  Sincerely, Burna Forts PA-C [facility] Phone: [phone] Fax: [phone]  05/24/2023, 11:41 AM

## 2023-05-25 DIAGNOSIS — M76891 Other specified enthesopathies of right lower limb, excluding foot: Secondary | ICD-10-CM | POA: Diagnosis not present

## 2023-05-28 DIAGNOSIS — M76891 Other specified enthesopathies of right lower limb, excluding foot: Secondary | ICD-10-CM | POA: Diagnosis not present

## 2023-05-28 DIAGNOSIS — M24851 Other specific joint derangements of right hip, not elsewhere classified: Secondary | ICD-10-CM | POA: Diagnosis not present

## 2023-05-28 DIAGNOSIS — Z79899 Other long term (current) drug therapy: Secondary | ICD-10-CM | POA: Diagnosis not present

## 2023-05-28 DIAGNOSIS — K219 Gastro-esophageal reflux disease without esophagitis: Secondary | ICD-10-CM | POA: Diagnosis not present

## 2023-05-29 ENCOUNTER — Encounter (INDEPENDENT_AMBULATORY_CARE_PROVIDER_SITE_OTHER): Payer: Self-pay | Admitting: Physician Assistant

## 2023-05-29 DIAGNOSIS — M76891 Other specified enthesopathies of right lower limb, excluding foot: Secondary | ICD-10-CM | POA: Diagnosis not present

## 2023-05-29 DIAGNOSIS — K219 Gastro-esophageal reflux disease without esophagitis: Secondary | ICD-10-CM | POA: Diagnosis not present

## 2023-05-29 DIAGNOSIS — Z79899 Other long term (current) drug therapy: Secondary | ICD-10-CM | POA: Diagnosis not present

## 2023-06-04 ENCOUNTER — Ambulatory Visit: Payer: Medicare Other | Admitting: Behavioral Health

## 2023-06-04 ENCOUNTER — Emergency Department (HOSPITAL_BASED_OUTPATIENT_CLINIC_OR_DEPARTMENT_OTHER)

## 2023-06-04 ENCOUNTER — Other Ambulatory Visit: Payer: Self-pay

## 2023-06-04 ENCOUNTER — Emergency Department (HOSPITAL_BASED_OUTPATIENT_CLINIC_OR_DEPARTMENT_OTHER)
Admission: EM | Admit: 2023-06-04 | Discharge: 2023-06-04 | Disposition: A | Discharge status: Home / Self Care | Attending: Emergency Medicine | Admitting: Emergency Medicine

## 2023-06-04 ENCOUNTER — Encounter (HOSPITAL_BASED_OUTPATIENT_CLINIC_OR_DEPARTMENT_OTHER): Payer: Self-pay

## 2023-06-04 DIAGNOSIS — M79604 Pain in right leg: Secondary | ICD-10-CM | POA: Insufficient documentation

## 2023-06-04 DIAGNOSIS — M79661 Pain in right lower leg: Secondary | ICD-10-CM | POA: Diagnosis not present

## 2023-06-04 NOTE — ED Provider Notes (Signed)
 [place] EMERGENCY DEPARTMENT AT [facility] Provider Note   CSN: [id number] Arrival date & time: 06/04/23  1627     History  Chief Complaint  Patient presents with   Leg Pain    Jordan [name] is a 63 y.o. female.  63 year old female presents with complaint of right calf pain with tightness in the leg feeling cold and tingly.  States that she had nerve surgery about her right issue femoral nerve last week, called her provider's office and was advised to come to the ER to evaluate for possible DVT.  She is taking an aspirin daily.  No history of PE or DVT otherwise.  Denies chest pain or shortness of breath.  No other complaints or concerns today.       Home Medications Prior to Admission medications   Medication Sig Start Date End Date Taking? Authorizing Provider  ALPRAZolam Prudy Feeler) 0.5 MG tablet TAKE ONE TABLET BY MOUTH TWICE A DAY AS NEEDED FOR ANXIETY 05/22/23   White, Watt Climes, NP  busPIRone (BUSPAR) 30 MG tablet Take 1 tablet (30 mg total) by mouth 2 (two) times daily. 05/22/23   Joan Flores, NP  Cholecalciferol (VITAMIN D3) 125 MCG (5000 UT) CAPS Take 1 capsule (5,000 Units total) by mouth daily. 11/13/22   [name], MD  dicyclomine (BENTYL) 10 MG capsule TAKE ONE CAPSULE BY MOUTH EVERY 6 HOURS AS NEEDED FOR ABDOMINAL PAIN 03/19/23   Armbruster, [name], MD  DULoxetine (CYMBALTA) 60 MG capsule TAKE 1 CAPSULE BY MOUTH TWICE  DAILY 02/06/23   Avelina Laine A, NP  esomeprazole (NEXIUM) 40 MG capsule TAKE 1 CAPSULE BY MOUTH TWICE  DAILY BEFORE MEALS 03/26/23   Armbruster, [name], MD  fluticasone (FLONASE) 50 MCG/ACT nasal spray SPRAY 1 SPRAY IN EACH NOSTRIL ONCE DAILY AS NEEDED FOR ALLERGIES OR RHINITIS 05/29/22   Philip Aspen, Limmie Patricia, MD  lamoTRIgine (LAMICTAL) 25 MG tablet Take 1 tablet (25 mg total) by mouth 2 (two) times daily. 05/22/23   Joan Flores, NP  tiZANidine (ZANAFLEX) 2 MG tablet TAKE 1 TABLET BY MOUTH 3 TIMES A DAY AS NEEDED FOR PAIN/SPASM  04/24/22   [provider]  traZODone (DESYREL) 50 MG tablet Take 2 tablets (100 mg total) by mouth at bedtime. 05/22/23   Joan Flores, NP      Allergies    Gabapentin, Mirtazapine, Prednisone, and Pregabalin    Review of Systems   Review of Systems Negative except as per HPI Physical Exam Updated Vital Signs BP 111/67 (BP Location: Right Arm)   Pulse 71   Temp 98 F (36.7 C) (Oral)   Resp 16   SpO2 96%  Physical Exam Vitals and nursing note reviewed.  Constitutional:      General: She is not in acute distress.    Appearance: She is well-developed. She is not diaphoretic.  HENT:     Head: Normocephalic and atraumatic.  Cardiovascular:     Pulses: Normal pulses.  Pulmonary:     Effort: Pulmonary effort is normal.  Musculoskeletal:        General: Tenderness present. No swelling or deformity.     Comments: No lower extremity swelling.  Mild tenderness reported in the right calf.  Skin:    General: Skin is warm and dry.  Neurological:     Mental Status: She is alert and oriented to person, place, and time.     Sensory: No sensory deficit.     Motor: No weakness.  Psychiatric:        Behavior: Behavior normal.     ED Results / Procedures / Treatments   Labs (all labs ordered are listed, but only abnormal results are displayed) Labs Reviewed - No data to display  EKG None  Radiology US  Venous Img Lower Right (DVT Study) Result Date: 06/04/2023 CLINICAL DATA:  Right calf pain and tightness status post recent hip surgery EXAM: RIGHT LOWER EXTREMITY VENOUS DOPPLER ULTRASOUND TECHNIQUE: Gray-scale sonography with compression, as well as color and duplex ultrasound, were performed to evaluate the deep venous system(s) from the level of the common femoral vein through the popliteal and proximal calf veins. COMPARISON:  None available FINDINGS: VENOUS Normal compressibility of the common femoral, superficial femoral, and popliteal veins, as well as the visualized calf  veins. Visualized portions of profunda femoral vein and great saphenous vein unremarkable. No filling defects to suggest DVT on grayscale or color Doppler imaging. Doppler waveforms show normal direction of venous flow, normal respiratory plasticity and response to augmentation. Limited views of the contralateral common femoral vein are unremarkable. OTHER None. Limitations: none IMPRESSION: No right lower extremity DVT. Electronically Signed   By: Elester Grim M.D.   On: 06/04/2023 17:57    Procedures Procedures    Medications Ordered in ED Medications - No data to display  ED Course/ Medical Decision Making/ A&P                                 Medical Decision Making  This patient presents to the ED for concern of right calf pain, this involves an extensive number of treatment options, and is a complaint that carries with it a high risk of complications and morbidity.  The differential diagnosis includes but not limited to DVT, radiculopathy    Co morbidities that complicate the patient evaluation  As reviewed in chart   Additional history obtained:  External records from outside source obtained and reviewed including op noted from 05/28/23 with Dr. Verla Glaze for hip endoscopic ischiofemoral debridement  Imaging Studies ordered:  I ordered imaging studies including venous doppler RLE  I agree with the radiologist interpretation- negative for DVT    Problem List / ED Course / Critical interventions / Medication management  63 year old female with concern for pain in her right lower leg after nerve surgery. No appreciable swelling in the leg, does have mild calf tenderness without erythema or cords. DVT study negative for DVT. Recommend recheck with PCP.  I have reviewed the patients home medicines and have made adjustments as needed   Social Determinants of Health:  Has PCP   Test / Admission - Considered:  Stable for discharge         Final Clinical Impression(s)  / ED Diagnoses Final diagnoses:  Right leg pain    Rx / DC Orders ED Discharge Orders     None         Darlis Eisenmenger, PA-C 06/04/23 1809    Hershel Los, MD 06/04/23 2322

## 2023-06-04 NOTE — ED Triage Notes (Signed)
 Pt c/o intermittent cold/ tingling in RLE, "tightness" in R calf. Denies CP, SHOB. R hip surgery last week

## 2023-06-04 NOTE — Discharge Instructions (Addendum)
 Please recheck with your primary care provider. Your ultrasound does not show a blood clot in your leg today.

## 2023-06-07 ENCOUNTER — Ambulatory Visit: Admitting: Psychology

## 2023-06-07 ENCOUNTER — Ambulatory Visit

## 2023-06-07 NOTE — Progress Notes (Unsigned)
   Severo Beber, LCSW

## 2023-06-12 ENCOUNTER — Encounter (INDEPENDENT_AMBULATORY_CARE_PROVIDER_SITE_OTHER): Payer: Self-pay

## 2023-06-14 ENCOUNTER — Ambulatory Visit: Attending: Orthopedic Surgery | Admitting: Physical Therapy

## 2023-06-14 ENCOUNTER — Other Ambulatory Visit: Payer: Self-pay

## 2023-06-14 ENCOUNTER — Telehealth (INDEPENDENT_AMBULATORY_CARE_PROVIDER_SITE_OTHER): Payer: Self-pay

## 2023-06-14 DIAGNOSIS — R262 Difficulty in walking, not elsewhere classified: Secondary | ICD-10-CM | POA: Insufficient documentation

## 2023-06-14 DIAGNOSIS — M6281 Muscle weakness (generalized): Secondary | ICD-10-CM | POA: Diagnosis not present

## 2023-06-14 DIAGNOSIS — M25551 Pain in right hip: Secondary | ICD-10-CM | POA: Diagnosis not present

## 2023-06-14 NOTE — Telephone Encounter (Signed)
 Patient Jordan [name] requesting CT scan. Called patient back to provide her with the number for scheduling.

## 2023-06-14 NOTE — Therapy (Signed)
 OUTPATIENT PHYSICAL THERAPY LOWER EXTREMITY EVALUATION   Patient Name: Jordan [name] MRN: [id number] DOB:08/10/1960, 63 y.o., female Today's Date: 06/14/2023  END OF SESSION:  PT End of Session - 06/14/23 1152     Visit Number 1    Date for PT Re-Evaluation 08/09/23    Authorization Type [employer] Medicare will submit    Progress Note Due on Visit 10    PT Start Time 1148    PT Stop Time 1230    PT Time Calculation (min) 42 min    Activity Tolerance Patient tolerated treatment well             Past Medical History:  Diagnosis Date   Acute upper respiratory infections of unspecified site    ADD (attention deficit disorder)    Allergic rhinitis due to pollen    Anal fissure    Anemia, unspecified    Anxiety disorder    Asthma    Carpal tunnel syndrome 04/25/2007   Qualifier: Diagnosis of   By: Edmonia Gottron MD, Beauford Bounds        Chicken pox    Complication of anesthesia    wakes up slow   Cough 07/03/2011   Followed in Pulmonary clinic/ [facility]/ [place]      - Spirometry nl during flare 07/03/2011      Had severe asthmatic bronchitis vs pneumonia requiring hospitalization April '13 for severe bronchospasm. Subsequently had persistent cough and bronchospasm for several months.      Depression, recurrent (HCC) 12/05/2006   Qualifier: Diagnosis of   By: Verdia Glad CMA, Jessica      History or severe depression - followed by Dr. Deborra Falter  Additional diagnosis: ADD, agraphobia, self-mutilation.  Med added - abilify     Depressive disorder, not elsewhere classified    Diffuse cystic mastopathy    Falls    Fibromyalgia    GERD (gastroesophageal reflux disease)    Headache(784.0)    History of anorexia nervosa    History of bulimia    IBS (irritable bowel syndrome)    IGT (impaired glucose tolerance) 03/24/2021   Lab Results      Component    Value    Date           HGBA1C    5.3    04/13/2022           Incontinence without sensory awareness 06/01/2009   Qualifier: Diagnosis of   By:  Edmonia Gottron MD, Beauford Bounds        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    Iron deficiency anemia 03/24/2021   Lump or mass in breast    Meningitis, unspecified(322.9)    NASAL POLYP 05/14/2009   NECK PAIN, CHRONIC 02/19/2007   Qualifier: Diagnosis of   By: Edmonia Gottron MD, Beauford Bounds        Oral thrush    Other specified glaucoma    Perforation of tympanic membrane, unspecified    Radial styloid tenosynovitis    Seizures (HCC)    TYMPANIC MEMBRANE PERFORATION 12/05/2006   Annotation: REPAIRED  Qualifier: Diagnosis of   By: Verdia Glad CMA, Jessica      Replacing diagnoses that were inactivated after the 05/22/22 regulatory import     Past Surgical History:  Procedure Laterality Date   ADENOIDECTOMY     ANKLE SURGERY  1992   right ankle - scar tissue   DILATION AND CURETTAGE OF UTERUS  2006   History of Menometrorrhagia   INNER EAR SURGERY  1966 to 1986  6 surgeries on right ear   LAPAROSCOPY  1993   normal   REDUCTION MAMMAPLASTY Bilateral 2018   uterine ablation  2011   WRIST SURGERY  2001   for de-quervaine - bilateral wrists - tendonitis   Patient Active Problem List   Diagnosis Date Noted   Thyroid  nodule 04/16/2023   Postmenopausal estrogen deficiency 04/16/2023   Conductive hearing loss of right ear with unrestricted hearing of left ear 04/16/2023   Abnormal colonoscopy 01/05/2023   Iron deficiency 11/13/2022   Hip pain 11/13/2022   Vitamin D  deficiency 04/19/2022   Hypertriglyceridemia 03/24/2021   Low back pain 10/19/2020   Severe recurrent major depression without psychotic features (HCC) 09/27/2015    Class: Chronic   Fibromyalgia 04/03/2013   Back pain, lumbosacral 04/03/2013   Degeneration of cervical intervertebral disc 04/03/2013   Dyspepsia and disorder of function of stomach 03/16/2012   Mild cognitive impairment with memory loss 01/03/2012   Bilateral dry eyes 05/13/2011   Back pain, thoracic 06/15/2010   MULTIPLE SCLEROSIS, PROGRESSIVE/RELAPSING 06/02/2009   Claustrophobia  04/25/2007   HAY FEVER 02/18/2007   GLAUCOMA NEC 12/05/2006   FIBROCYSTIC BREAST DISEASE 12/05/2006   Headache 12/05/2006    PCP: Anthon Kins, MD  REFERRING PROVIDER: Melville Stade  REFERRING DIAG:  Y86.578 other specified enthesopathies of the right lower limb, excluding the foot  THERAPY DIAG:  Pain in right hip  Muscle weakness (generalized)  Difficulty in walking, not elsewhere classified  Rationale for Evaluation and Treatment: Rehabilitation  ONSET DATE: 05/28/23  SUBJECTIVE:   SUBJECTIVE STATEMENT: Surgery 4/7 for right hip enthesopathy- lesser trochanteric excision at [facility]; currently using a single point cane;  As a result of injury 4 years loading kayak and legs got twisted and fell.  Nerve wrapped around femoral head on both left and right hips.  Had same surgery left last year and did well with recovery.    Can walk 1/3 mile 2x/day (slowly) walks the dog Primary school teacher); drags right foot with fatigue; had a Doppler recently negative DVT   No lifting > 10#, no running  PERTINENT HISTORY: See above list for numerous co-morbidities Left hip labral tear and hamstring tears bil Left lesser trochanter excision 04/12/22 had PT at [facility] with Jen good recovery Dizziness in some situations  1992 peroneal nerve surgery right PAIN:  Are you having pain?  yes Yes: NPRS scale: 7-8 Pain location: popped last night in front, lateral hip, groin, weakness in front thigh  Pain description: weakness front thigh, pain groin; lateral hip Aggravating factors: hip flexion needs assist leg in/out of car and to reach shoe; too much activity Relieving factors: lie or sit down, ice  PRECAUTIONS: None  RED FLAGS: None   WEIGHT BEARING RESTRICTIONS: No  FALLS:  Has patient fallen in last 6 months? Yes. Number of falls 1 yesterday in the front yard steep incline  LIVING ENVIRONMENT: Lives with: lives alone Lives in: House/apartment Stairs: Yes: External: 2  steps; none Has following equipment at home: Single point cane  OCCUPATION:  disability PLOF: Independent, Independent with basic ADLs, Independent with household mobility without device, Independent with community mobility without device, Independent with homemaking with ambulation, Independent with gait, and Independent with transfers  PATIENT GOALS:  to be able to get back to being active, and get my life back; play basketball with kids in the neighborhood; swimming; hike and fly fishing; kayaking   NEXT MD VISIT: sutures out Tuesday  OBJECTIVE:  Note: Objective measures  were completed at Evaluation unless otherwise noted.  DIAGNOSTIC FINDINGS: na  PATIENT SURVEYS:  LEFS 23/80  COGNITION: Overall cognitive status: Within functional limits for tasks assessed     SENSATION: Intermittent pins/needles in lower leg/foot  POSTURE: rounded shoulders, forward head, and increased thoracic kyphosis  APPEARANCE: 5 portal incisions posterior hip with sutures intact  LOWER EXTREMITY ROM: Limited active seated and supine right hip flexion   LOWER EXTREMITY MMT:  MMT Right eval Left eval  Hip flexion 2 5  Hip extension 3 5  Hip abduction 2+ 5  Hip adduction    Hip internal rotation    Hip external rotation 3 5  Knee flexion    Knee extension 3 5  Ankle dorsiflexion    Ankle plantarflexion    Ankle inversion    Ankle eversion     (Blank rows = not tested)  FUNCTIONAL TESTS:  5 times sit to stand: 24,36 no hands, shifted more to the left Timed up and go (TUG): 16.98 3 MWT 370 feet with single point cane  GAIT: Comments: decreased stance time on right; decreased hip extension; pelvic drop with fatigue                                                                                                                                TREATMENT DATE: 06/14/23 Initial eval completed and initiated HEP    PATIENT EDUCATION:  Education details: Initiated HEP Person educated:  Patient Education method: Programmer, multimedia, Facilities manager, Verbal cues, and Handouts Education comprehension: verbalized understanding, returned demonstration, and verbal cues required  HOME EXERCISE PROGRAM: Access Code: [email or web address] URL: [email or web address] Date: 06/14/2023 Prepared by: Darien [name]  Exercises - Quadruped Rocking Backward  - 1 x daily - 7 x weekly - 1 sets - 10 reps - Supine Pelvic Tilt  - 1 x daily - 7 x weekly - 1 sets - 10 reps - Supine Hip Abduction AROM  - 1 x daily - 7 x weekly - 1 sets - 10 reps - Clamshell  - 1 x daily - 7 x weekly - 1 sets - 10 reps  ASSESSMENT:  CLINICAL IMPRESSION: Patient is a 63 y.o. female who was seen today for physical therapy evaluation and treatment for post op right lesser trochanter excision.  She presents with antalgic gait, use of assistive device, decreased ROM, strength and function along with elevated pain.  She would respond well to skilled PT for LE flexibility, right hip stability and strength training, gait training, transfer training and pain control.     OBJECTIVE IMPAIRMENTS: Abnormal gait, difficulty walking, decreased ROM, decreased strength, hypomobility, increased fascial restrictions, increased muscle spasms, impaired flexibility, postural dysfunction, and pain.   ACTIVITY LIMITATIONS: carrying, lifting, bending, sitting, standing, squatting, sleeping, stairs, transfers, bed mobility, bathing, toileting, dressing, and caring for others  PARTICIPATION LIMITATIONS: meal prep, cleaning, laundry, driving, shopping, community activity, yard work, and church  PERSONAL FACTORS: Behavior pattern, Fitness, Past/current experiences, and  3+ comorbidities: depressive disorder, fibromyalgia, anxiety  are also affecting patient's functional outcome.   REHAB POTENTIAL: Fair due to multiple co-morbidities and nature of patient's chronic pain condition  CLINICAL DECISION MAKING: Evolving/moderate complexity  EVALUATION  COMPLEXITY: Moderate   GOALS: Goals reviewed with patient? Yes  SHORT TERM GOALS: Target date: 07/05/2023  Patient will be independent with initial HEP  Baseline: Goal status: INITIAL  2.  Pain report to be no greater than 4/10  Baseline:  Goal status: INITIAL   LONG TERM GOALS: Target date: 08/09/2023     Patient to report pain no greater than 2/10  Baseline:  Goal status: INITIAL  2.  Patient will be independent with initial HEP  Baseline:  Goal status: INITIAL  3.  Patient to be able to stand or walk for at least 15 min without leg pain  Baseline:  Goal status: INITIAL  4.  Patient to be able to bend, stoop and squat with pain no greater than 2/10  Baseline:  Goal status: INITIAL  5.  Patient to be able to do all ADL's and IADL's independently and safely Baseline:  Goal status: INITIAL  6.  TUG and 5 times sit to stand scores to improve by 2-3 seconds Baseline:  Goal status: INITIAL   PLAN:  PT FREQUENCY: 2x/week  PT DURATION: 8 weeks  PLANNED INTERVENTIONS: 97110-Therapeutic exercises, 97530- Therapeutic activity, 97112- Neuromuscular re-education, 97535- Self Care, 29562- Manual therapy, [phone]- Gait training, [phone]- Aquatic Therapy, [phone]- Electrical stimulation (unattended), [phone]- Electrical stimulation (manual), 97016- Vasopneumatic device, [id number]- Ultrasound, [id number]- Ionotophoresis 4mg /ml Dexamethasone , Patient/Family education, Balance training, Stair training, Taping, Dry Needling, Joint mobilization, Spinal mobilization, DME instructions, Cryotherapy, and Moist heat  PLAN FOR NEXT SESSION: Nustep, review HEP, begin hip stability training  Darien [name], PT 06/14/23 5:38 PM Phone: [phone] Fax: [phone]

## 2023-06-21 ENCOUNTER — Ambulatory Visit: Admitting: Rehabilitative and Restorative Service Providers"

## 2023-06-25 ENCOUNTER — Institutional Professional Consult (permissible substitution) (INDEPENDENT_AMBULATORY_CARE_PROVIDER_SITE_OTHER): Payer: Medicare Other

## 2023-06-26 ENCOUNTER — Encounter: Payer: Self-pay | Admitting: Rehabilitative and Restorative Service Providers"

## 2023-06-26 ENCOUNTER — Ambulatory Visit: Attending: Surgery | Admitting: Rehabilitative and Restorative Service Providers"

## 2023-06-26 DIAGNOSIS — M6281 Muscle weakness (generalized): Secondary | ICD-10-CM | POA: Diagnosis not present

## 2023-06-26 DIAGNOSIS — R252 Cramp and spasm: Secondary | ICD-10-CM | POA: Diagnosis not present

## 2023-06-26 DIAGNOSIS — M25551 Pain in right hip: Secondary | ICD-10-CM | POA: Diagnosis not present

## 2023-06-26 DIAGNOSIS — R262 Difficulty in walking, not elsewhere classified: Secondary | ICD-10-CM | POA: Diagnosis not present

## 2023-06-26 NOTE — Therapy (Signed)
 OUTPATIENT PHYSICAL THERAPY TREATMENT NOTE   Patient Name: Jordan [name] MRN: [id number] DOB:December 13, 1960, 63 y.o., female Today's Date: 06/26/2023  END OF SESSION:  PT End of Session - 06/26/23 1104     Visit Number 2    Date for PT Re-Evaluation 08/09/23    Authorization Type [employer] 06/14/23 - 08/09/2023    Authorization - Visit Number 2    Authorization - Number of Visits 16    Progress Note Due on Visit 10    PT Start Time 1101    PT Stop Time 1140    PT Time Calculation (min) 39 min    Activity Tolerance Patient tolerated treatment well    Behavior During Therapy WFL for tasks assessed/performed             Past Medical History:  Diagnosis Date   Acute upper respiratory infections of unspecified site    ADD (attention deficit disorder)    Allergic rhinitis due to pollen    Anal fissure    Anemia, unspecified    Anxiety disorder    Asthma    Carpal tunnel syndrome 04/25/2007   Qualifier: Diagnosis of   By: Edmonia Gottron MD, Beauford Bounds        Chicken pox    Complication of anesthesia    wakes up slow   Cough 07/03/2011   Followed in Pulmonary clinic/ [facility]/ [place]      - Spirometry nl during flare 07/03/2011      Had severe asthmatic bronchitis vs pneumonia requiring hospitalization April '13 for severe bronchospasm. Subsequently had persistent cough and bronchospasm for several months.      Depression, recurrent (HCC) 12/05/2006   Qualifier: Diagnosis of   By: Verdia Glad CMA, Jessica      History or severe depression - followed by Dr. Deborra Falter  Additional diagnosis: ADD, agraphobia, self-mutilation.  Med added - abilify     Depressive disorder, not elsewhere classified    Diffuse cystic mastopathy    Falls    Fibromyalgia    GERD (gastroesophageal reflux disease)    Headache(784.0)    History of anorexia nervosa    History of bulimia    IBS (irritable bowel syndrome)    IGT (impaired glucose tolerance) 03/24/2021   Lab Results      Component    Value    Date            HGBA1C    5.3    04/13/2022           Incontinence without sensory awareness 06/01/2009   Qualifier: Diagnosis of   By: Edmonia Gottron MD, Beauford Bounds        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    Iron deficiency anemia 03/24/2021   Lump or mass in breast    Meningitis, unspecified(322.9)    NASAL POLYP 05/14/2009   NECK PAIN, CHRONIC 02/19/2007   Qualifier: Diagnosis of   By: Edmonia Gottron MD, Beauford Bounds        Oral thrush    Other specified glaucoma    Perforation of tympanic membrane, unspecified    Radial styloid tenosynovitis    Seizures (HCC)    TYMPANIC MEMBRANE PERFORATION 12/05/2006   Annotation: REPAIRED  Qualifier: Diagnosis of   By: Verdia Glad CMA, Jessica      Replacing diagnoses that were inactivated after the 05/22/22 regulatory import     Past Surgical History:  Procedure Laterality Date   ADENOIDECTOMY     ANKLE SURGERY  1992   right ankle -  scar tissue   DILATION AND CURETTAGE OF UTERUS  2006   History of Menometrorrhagia   INNER EAR SURGERY  1966 to 1986   6 surgeries on right ear   LAPAROSCOPY  1993   normal   REDUCTION MAMMAPLASTY Bilateral 2018   uterine ablation  2011   WRIST SURGERY  2001   for de-quervaine - bilateral wrists - tendonitis   Patient Active Problem List   Diagnosis Date Noted   Thyroid  nodule 04/16/2023   Postmenopausal estrogen deficiency 04/16/2023   Conductive hearing loss of right ear with unrestricted hearing of left ear 04/16/2023   Abnormal colonoscopy 01/05/2023   Iron deficiency 11/13/2022   Hip pain 11/13/2022   Vitamin D  deficiency 04/19/2022   Hypertriglyceridemia 03/24/2021   Low back pain 10/19/2020   Severe recurrent major depression without psychotic features (HCC) 09/27/2015    Class: Chronic   Fibromyalgia 04/03/2013   Back pain, lumbosacral 04/03/2013   Degeneration of cervical intervertebral disc 04/03/2013   Dyspepsia and disorder of function of stomach 03/16/2012   Mild cognitive impairment with memory loss 01/03/2012   Bilateral  dry eyes 05/13/2011   Back pain, thoracic 06/15/2010   MULTIPLE SCLEROSIS, PROGRESSIVE/RELAPSING 06/02/2009   Claustrophobia 04/25/2007   HAY FEVER 02/18/2007   GLAUCOMA NEC 12/05/2006   FIBROCYSTIC BREAST DISEASE 12/05/2006   Headache 12/05/2006    PCP: Anthon Kins, MD  REFERRING PROVIDER: Melville Stade  REFERRING DIAG:  I69.629 other specified enthesopathies of the right lower limb, excluding the foot  THERAPY DIAG:  Pain in right hip  Muscle weakness (generalized)  Difficulty in walking, not elsewhere classified  Rationale for Evaluation and Treatment: Rehabilitation  ONSET DATE: 05/28/23  SUBJECTIVE:   SUBJECTIVE STATEMENT: Patient reports that she had a fall last week while she was outside doing yard work, reports soreness after the fall, but denies any new injuries.     PERTINENT HISTORY: See above list for numerous co-morbidities Left hip labral tear and hamstring tears bil Left lesser trochanter excision 04/12/22 had PT at [facility] with Margo Shells good recovery Dizziness in some situations  1992 peroneal nerve surgery right  Surgery 4/7 for right hip enthesopathy- lesser trochanteric excision at [facility]; currently using a single point cane;  As a result of injury 4 years loading kayak and legs got twisted and fell.  Nerve wrapped around femoral head on both left and right hips.  Had same surgery left last year and did well with recovery.    Can walk 1/3 mile 2x/day (slowly) walks the dog Primary school teacher); drags right foot with fatigue; had a Doppler recently negative DVT  PAIN:  Are you having pain?  yes Yes: NPRS scale: 5/10 Pain location: right hip Pain description: weakness front thigh, pain groin; lateral hip Aggravating factors: hip flexion needs assist leg in/out of car and to reach shoe; too much activity Relieving factors: lie or sit down, ice  PRECAUTIONS: Other: No lifting > 10#, no running  RED FLAGS: None   WEIGHT BEARING RESTRICTIONS:  No  FALLS:  Has patient fallen in last 6 months? Yes. Number of falls 1 yesterday in the front yard steep incline  LIVING ENVIRONMENT: Lives with: lives alone Lives in: House/apartment Stairs: Yes: External: 2 steps; none Has following equipment at home: Single point cane  OCCUPATION:  disability PLOF: Independent, Independent with basic ADLs, Independent with household mobility without device, Independent with community mobility without device, Independent with homemaking with ambulation, Independent with gait, and Independent with transfers  PATIENT GOALS:  to be able to get back to being active, and get my life back; play basketball with kids in the neighborhood; swimming; hike and fly fishing; kayaking   NEXT MD VISIT: follow up on 07/10/2023  OBJECTIVE:  Note: Objective measures were completed at Evaluation unless otherwise noted.  DIAGNOSTIC FINDINGS: na  PATIENT SURVEYS:  Eval:  LEFS 23/80  COGNITION: Overall cognitive status: Within functional limits for tasks assessed     SENSATION: Intermittent pins/needles in lower leg/foot  POSTURE: rounded shoulders, forward head, and increased thoracic kyphosis  APPEARANCE: 5 portal incisions posterior hip with sutures intact  LOWER EXTREMITY ROM: Limited active seated and supine right hip flexion   LOWER EXTREMITY MMT:  MMT Right eval Left eval  Hip flexion 2 5  Hip extension 3 5  Hip abduction 2+ 5  Hip adduction    Hip internal rotation    Hip external rotation 3 5  Knee flexion    Knee extension 3 5  Ankle dorsiflexion    Ankle plantarflexion    Ankle inversion    Ankle eversion     (Blank rows = not tested)  FUNCTIONAL TESTS:  Eval: 5 times sit to stand: 24,36 no hands, shifted more to the left Timed up and go (TUG): 16.98 3 MWT 370 feet with single point cane  GAIT: Comments: decreased stance time on right; decreased hip extension; pelvic drop with fatigue                                                                                                                                 TREATMENT DATE:  06/26/2023 Nustep level 5 x6 min with PT present to discuss status Standing hamstring stretch at stairs 2x20 sec bilat Quadruped backwards rocking 2x10 Supine hip adduction ball squeeze 2x10 Supine clamshells with yellow loop 2x10 Supine marching with yellow loop around knees 2x10 Supine straight leg raise 2x10 bilat Supine heel slide with foot on slider 2x10 right LE Sit to stand from PT mat with elevated foam pad seat:  2x10 FWD step ups on 4" step with unilateral UE support x7 bilat   06/14/23 Initial eval completed and initiated HEP    PATIENT EDUCATION:  Education details: Initiated HEP Person educated: Patient Education method: Programmer, multimedia, Facilities manager, Verbal cues, and Handouts Education comprehension: verbalized understanding, returned demonstration, and verbal cues required  HOME EXERCISE PROGRAM: Access Code: [email or web address] URL: [email or web address] Date: 06/14/2023 Prepared by: Darien [name]  Exercises - Quadruped Rocking Backward  - 1 x daily - 7 x weekly - 1 sets - 10 reps - Supine Pelvic Tilt  - 1 x daily - 7 x weekly - 1 sets - 10 reps - Supine Hip Abduction AROM  - 1 x daily - 7 x weekly - 1 sets - 10 reps - Clamshell  - 1 x daily - 7 x weekly - 1 sets - 10 reps  ASSESSMENT:  CLINICAL IMPRESSION: Ms Penate presents  to skilled PT reporting that she fell last week while doing some work in her yard.  States that she didn't injure herself "just my pride."  Patient with difficulty with straight leg raise on the right side and requires assistance of PT to complete.  Patient with difficulty with independent heel slide, but able to complete when added a slider under her foot.  Patient continues to require skilled PT to progress towards goal related activities.  OBJECTIVE IMPAIRMENTS: Abnormal gait, difficulty walking, decreased ROM, decreased strength,  hypomobility, increased fascial restrictions, increased muscle spasms, impaired flexibility, postural dysfunction, and pain.   ACTIVITY LIMITATIONS: carrying, lifting, bending, sitting, standing, squatting, sleeping, stairs, transfers, bed mobility, bathing, toileting, dressing, and caring for others  PARTICIPATION LIMITATIONS: meal prep, cleaning, laundry, driving, shopping, community activity, yard work, and church  PERSONAL FACTORS: Behavior pattern, Fitness, Past/current experiences, and 3+ comorbidities: depressive disorder, fibromyalgia, anxiety  are also affecting patient's functional outcome.   REHAB POTENTIAL: Fair due to multiple co-morbidities and nature of patient's chronic pain condition  CLINICAL DECISION MAKING: Evolving/moderate complexity  EVALUATION COMPLEXITY: Moderate   GOALS: Goals reviewed with patient? Yes  SHORT TERM GOALS: Target date: 07/05/2023  Patient will be independent with initial HEP  Baseline: Goal status: Met on 06/26/23  2.  Pain report to be no greater than 4/10  Baseline:  Goal status: Ongoing   LONG TERM GOALS: Target date: 08/09/2023     Patient to report pain no greater than 2/10  Baseline:  Goal status: INITIAL  2.  Patient will be independent with initial HEP  Baseline:  Goal status: INITIAL  3.  Patient to be able to stand or walk for at least 15 min without leg pain  Baseline:  Goal status: INITIAL  4.  Patient to be able to bend, stoop and squat with pain no greater than 2/10  Baseline:  Goal status: INITIAL  5.  Patient to be able to do all ADL's and IADL's independently and safely Baseline:  Goal status: INITIAL  6.  TUG and 5 times sit to stand scores to improve by 2-3 seconds Baseline:  Goal status: INITIAL   PLAN:  PT FREQUENCY: 2x/week  PT DURATION: 8 weeks  PLANNED INTERVENTIONS: 97110-Therapeutic exercises, 97530- Therapeutic activity, 97112- Neuromuscular re-education, 97535- Self Care, 40981- Manual  therapy, [phone]- Gait training, [phone]- Aquatic Therapy, [phone]- Electrical stimulation (unattended), [phone]- Electrical stimulation (manual), 97016- Vasopneumatic device, [id number]- Ultrasound, [id number]- Ionotophoresis 4mg /ml Dexamethasone , Patient/Family education, Balance training, Stair training, Taping, Dry Needling, Joint mobilization, Spinal mobilization, DME instructions, Cryotherapy, and Moist heat  PLAN FOR NEXT SESSION: Nustep, review HEP, begin hip stability training    Robyne Christen, PT, DPT 06/26/23, 12:31 PM  [facility] [address] [place], [place] [address] Phone # [phone] Fax [phone]

## 2023-06-28 ENCOUNTER — Ambulatory Visit: Admitting: Rehabilitative and Restorative Service Providers"

## 2023-06-28 ENCOUNTER — Ambulatory Visit: Admitting: Psychology

## 2023-06-28 DIAGNOSIS — F331 Major depressive disorder, recurrent, moderate: Secondary | ICD-10-CM | POA: Diagnosis not present
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
## 2023-06-29 ENCOUNTER — Ambulatory Visit (HOSPITAL_BASED_OUTPATIENT_CLINIC_OR_DEPARTMENT_OTHER)
Admission: RE | Admit: 2023-06-29 | Discharge: 2023-06-29 | Disposition: A | Discharge status: Home / Self Care | Source: Ambulatory Visit | Attending: Physician Assistant | Admitting: Physician Assistant

## 2023-06-29 DIAGNOSIS — H9201 Otalgia, right ear: Secondary | ICD-10-CM | POA: Diagnosis not present

## 2023-06-29 DIAGNOSIS — H902 Conductive hearing loss, unspecified: Secondary | ICD-10-CM | POA: Diagnosis not present

## 2023-06-29 DIAGNOSIS — J342 Deviated nasal septum: Secondary | ICD-10-CM | POA: Diagnosis not present

## 2023-07-04 ENCOUNTER — Encounter: Admitting: Rehabilitative and Restorative Service Providers"

## 2023-07-06 ENCOUNTER — Encounter: Admitting: Rehabilitative and Restorative Service Providers"

## 2023-07-09 ENCOUNTER — Telehealth (INDEPENDENT_AMBULATORY_CARE_PROVIDER_SITE_OTHER): Payer: Self-pay

## 2023-07-09 ENCOUNTER — Telehealth (INDEPENDENT_AMBULATORY_CARE_PROVIDER_SITE_OTHER): Payer: Self-pay | Admitting: Physician Assistant

## 2023-07-09 ENCOUNTER — Encounter: Admitting: Rehabilitative and Restorative Service Providers"

## 2023-07-09 NOTE — Telephone Encounter (Signed)
 I received notification today that [name] has been trying to reach me for several weeks. I attempted to call her today, LVM.

## 2023-07-09 NOTE — Telephone Encounter (Signed)
 No problem, thanks Dee Farber

## 2023-07-09 NOTE — Telephone Encounter (Signed)
 Thanks I tried again but no answer. If she call please let her know that radiology has not interpreted the results yet so once they do I will give her a call. thanks

## 2023-07-09 NOTE — Telephone Encounter (Signed)
 Not sure,  I didn't see any documentation as well

## 2023-07-09 NOTE — Telephone Encounter (Signed)
 Patient returned your call regarding CT results.  Please call at [phone]

## 2023-07-09 NOTE — Telephone Encounter (Signed)
 Thanks Tami, I tried to call her but it went to voicemail. Do you know if anyone had gotten back to her from the last two messages. I do not see any documentation.

## 2023-07-11 ENCOUNTER — Ambulatory Visit: Admitting: Physical Therapy

## 2023-07-11 DIAGNOSIS — M6281 Muscle weakness (generalized): Secondary | ICD-10-CM

## 2023-07-11 DIAGNOSIS — R252 Cramp and spasm: Secondary | ICD-10-CM | POA: Diagnosis not present

## 2023-07-11 DIAGNOSIS — R262 Difficulty in walking, not elsewhere classified: Secondary | ICD-10-CM | POA: Diagnosis not present

## 2023-07-11 DIAGNOSIS — M25551 Pain in right hip: Secondary | ICD-10-CM | POA: Diagnosis not present

## 2023-07-11 NOTE — Therapy (Signed)
 OUTPATIENT PHYSICAL THERAPY TREATMENT NOTE   Patient Name: Jordan [name] MRN: [id number] DOB:1960/09/25, 63 y.o., female Today's Date: 07/11/2023  END OF SESSION:  PT End of Session - 07/11/23 1149     Visit Number 3    Number of Visits 16    Date for PT Re-Evaluation 08/09/23    Authorization Type [employer] 06/14/23 - 08/09/2023 16 steps    Authorization - Visit Number 3    Authorization - Number of Visits 16    Progress Note Due on Visit 10    PT Start Time 1145    PT Stop Time 1228    PT Time Calculation (min) 43 min    Activity Tolerance Patient tolerated treatment well             Past Medical History:  Diagnosis Date   Acute upper respiratory infections of unspecified site    ADD (attention deficit disorder)    Allergic rhinitis due to pollen    Anal fissure    Anemia, unspecified    Anxiety disorder    Asthma    Carpal tunnel syndrome 04/25/2007   Qualifier: Diagnosis of   By: Edmonia Gottron MD, Beauford Bounds        Chicken pox    Complication of anesthesia    wakes up slow   Cough 07/03/2011   Followed in Pulmonary clinic/ [facility]/ [place]      - Spirometry nl during flare 07/03/2011      Had severe asthmatic bronchitis vs pneumonia requiring hospitalization April '13 for severe bronchospasm. Subsequently had persistent cough and bronchospasm for several months.      Depression, recurrent (HCC) 12/05/2006   Qualifier: Diagnosis of   By: Verdia Glad CMA, Jessica      History or severe depression - followed by Dr. Deborra Falter  Additional diagnosis: ADD, agraphobia, self-mutilation.  Med added - abilify     Depressive disorder, not elsewhere classified    Diffuse cystic mastopathy    Falls    Fibromyalgia    GERD (gastroesophageal reflux disease)    Headache(784.0)    History of anorexia nervosa    History of bulimia    IBS (irritable bowel syndrome)    IGT (impaired glucose tolerance) 03/24/2021   Lab Results      Component    Value    Date           HGBA1C    5.3     04/13/2022           Incontinence without sensory awareness 06/01/2009   Qualifier: Diagnosis of   By: Edmonia Gottron MD, Beauford Bounds        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    Iron deficiency anemia 03/24/2021   Lump or mass in breast    Meningitis, unspecified(322.9)    NASAL POLYP 05/14/2009   NECK PAIN, CHRONIC 02/19/2007   Qualifier: Diagnosis of   By: Edmonia Gottron MD, Beauford Bounds        Oral thrush    Other specified glaucoma    Perforation of tympanic membrane, unspecified    Radial styloid tenosynovitis    Seizures (HCC)    TYMPANIC MEMBRANE PERFORATION 12/05/2006   Annotation: REPAIRED  Qualifier: Diagnosis of   By: Verdia Glad CMA, Jessica      Replacing diagnoses that were inactivated after the 05/22/22 regulatory import     Past Surgical History:  Procedure Laterality Date   ADENOIDECTOMY     ANKLE SURGERY  1992   right ankle -  scar tissue   DILATION AND CURETTAGE OF UTERUS  2006   History of Menometrorrhagia   INNER EAR SURGERY  1966 to 1986   6 surgeries on right ear   LAPAROSCOPY  1993   normal   REDUCTION MAMMAPLASTY Bilateral 2018   uterine ablation  2011   WRIST SURGERY  2001   for de-quervaine - bilateral wrists - tendonitis   Patient Active Problem List   Diagnosis Date Noted   Thyroid  nodule 04/16/2023   Postmenopausal estrogen deficiency 04/16/2023   Conductive hearing loss of right ear with unrestricted hearing of left ear 04/16/2023   Abnormal colonoscopy 01/05/2023   Iron deficiency 11/13/2022   Hip pain 11/13/2022   Vitamin D  deficiency 04/19/2022   Hypertriglyceridemia 03/24/2021   Low back pain 10/19/2020   Severe recurrent major depression without psychotic features (HCC) 09/27/2015    Class: Chronic   Fibromyalgia 04/03/2013   Back pain, lumbosacral 04/03/2013   Degeneration of cervical intervertebral disc 04/03/2013   Dyspepsia and disorder of function of stomach 03/16/2012   Mild cognitive impairment with memory loss 01/03/2012   Bilateral dry eyes 05/13/2011    Back pain, thoracic 06/15/2010   MULTIPLE SCLEROSIS, PROGRESSIVE/RELAPSING 06/02/2009   Claustrophobia 04/25/2007   HAY FEVER 02/18/2007   GLAUCOMA NEC 12/05/2006   FIBROCYSTIC BREAST DISEASE 12/05/2006   Headache 12/05/2006    PCP: Anthon Kins, MD  REFERRING PROVIDER: Melville Stade  REFERRING DIAG:  V78.469 other specified enthesopathies of the right lower limb, excluding the foot  THERAPY DIAG:  Pain in right hip  Muscle weakness (generalized)  Difficulty in walking, not elsewhere classified  Rationale for Evaluation and Treatment: Rehabilitation  ONSET DATE: 05/28/23  SUBJECTIVE:   SUBJECTIVE STATEMENT: The doctor thinks I have a labral tear in my right hip. I think I overdid it last time.  It hurts a lot especially with sit to stand.   Hurts to pick up things from the ground.  I was able to get on the riding mower.   PERTINENT HISTORY: See above list for numerous co-morbidities Left hip labral tear and hamstring tears bil Left lesser trochanter excision 04/12/22 had PT at [facility] with Margo Shells good recovery Dizziness in some situations  1992 peroneal nerve surgery right  Surgery 4/7 for right hip enthesopathy- lesser trochanteric excision at [facility]; currently using a single point cane;  As a result of injury 4 years loading kayak and legs got twisted and fell.  Nerve wrapped around femoral head on both left and right hips.  Had same surgery left last year and did well with recovery.    Can walk 1/3 mile 2x/day (slowly) walks the dog Primary school teacher); drags right foot with fatigue; had a Doppler recently negative DVT  PAIN:  Are you having pain?  yes Yes: NPRS scale: lateral hip 5/10 the groin area is 10/10 Pain location: right hip Pain description: weakness front thigh, pain groin; lateral hip Aggravating factors: hip flexion needs assist leg in/out of car and to reach shoe; too much activity Relieving factors: lie or sit down, ice  PRECAUTIONS: Other: No  lifting > 10#, no running  RED FLAGS: None   WEIGHT BEARING RESTRICTIONS: No  FALLS:  Has patient fallen in last 6 months? Yes. Number of falls 1 yesterday in the front yard steep incline  LIVING ENVIRONMENT: Lives with: lives alone Lives in: House/apartment Stairs: Yes: External: 2 steps; none Has following equipment at home: Single point cane  OCCUPATION:  disability PLOF: Independent,  Independent with basic ADLs, Independent with household mobility without device, Independent with community mobility without device, Independent with homemaking with ambulation, Independent with gait, and Independent with transfers  PATIENT GOALS:  to be able to get back to being active, and get my life back; play basketball with kids in the neighborhood; swimming; hike and fly fishing; kayaking   NEXT MD VISIT: follow up on 07/10/2023  OBJECTIVE:  Note: Objective measures were completed at Evaluation unless otherwise noted.  DIAGNOSTIC FINDINGS: na  PATIENT SURVEYS:  Eval:  LEFS 23/80  COGNITION: Overall cognitive status: Within functional limits for tasks assessed     SENSATION: Intermittent pins/needles in lower leg/foot  POSTURE: rounded shoulders, forward head, and increased thoracic kyphosis  APPEARANCE: 5 portal incisions posterior hip with sutures intact  LOWER EXTREMITY ROM: Limited active seated and supine right hip flexion   LOWER EXTREMITY MMT:  MMT Right eval Left eval  Hip flexion 2 5  Hip extension 3 5  Hip abduction 2+ 5  Hip adduction    Hip internal rotation    Hip external rotation 3 5  Knee flexion    Knee extension 3 5  Ankle dorsiflexion    Ankle plantarflexion    Ankle inversion    Ankle eversion     (Blank rows = not tested)  FUNCTIONAL TESTS:  Eval: 5 times sit to stand: 24,36 no hands, shifted more to the left Timed up and go (TUG): 16.98 3 MWT 370 feet with single point cane  GAIT: Comments: decreased stance time on right; decreased hip  extension; pelvic drop with fatigue                                                                                                                                TREATMENT DATE:  DATE: 07/11/23 Nustep x 8 min level 5 ES IFC  right groin, anterior thigh, lateral hip 9.2 ma 20 min partly concurrent with ex: Supine SAQ 10x 2 Supine HS isometric 10x Pelvic rocks supine 10x Supine ball squeeze 10x Supine clams green band isometric 10x Seated quad submax isometric push into ball Pt has a TENS unit at home but hasn't used in a while, discussed trial at home for pain control (pt given electrodes to take home)   06/26/2023 Nustep level 5 x6 min with PT present to discuss status Standing hamstring stretch at stairs 2x20 sec bilat Quadruped backwards rocking 2x10 Supine hip adduction ball squeeze 2x10 Supine clamshells with yellow loop 2x10 Supine marching with yellow loop around knees 2x10 Supine straight leg raise 2x10 bilat Supine heel slide with foot on slider 2x10 right LE Sit to stand from PT mat with elevated foam pad seat:  2x10 FWD step ups on 4" step with unilateral UE support x7 bilat   06/14/23 Initial eval completed and initiated HEP    PATIENT EDUCATION:  Education details: Initiated HEP Person educated: Patient Education method: Programmer, multimedia, Facilities manager, Verbal cues, and  Handouts Education comprehension: verbalized understanding, returned demonstration, and verbal cues required  HOME EXERCISE PROGRAM: Access Code: [email or web address] URL: [email or web address] Date: 06/14/2023 Prepared by: Darien [name]  Exercises - Quadruped Rocking Backward  - 1 x daily - 7 x weekly - 1 sets - 10 reps - Supine Pelvic Tilt  - 1 x daily - 7 x weekly - 1 sets - 10 reps - Supine Hip Abduction AROM  - 1 x daily - 7 x weekly - 1 sets - 10 reps - Clamshell  - 1 x daily - 7 x weekly - 1 sets - 10 reps  ASSESSMENT:  CLINICAL IMPRESSION: Treatment modified secondary to increased  pain in right groin region.  Applied ES to painful areas while performing low intensity ROM and submax isometrics.  She reports at the end of session that she feels looser and has a reduction in pain.    OBJECTIVE IMPAIRMENTS: Abnormal gait, difficulty walking, decreased ROM, decreased strength, hypomobility, increased fascial restrictions, increased muscle spasms, impaired flexibility, postural dysfunction, and pain.   ACTIVITY LIMITATIONS: carrying, lifting, bending, sitting, standing, squatting, sleeping, stairs, transfers, bed mobility, bathing, toileting, dressing, and caring for others  PARTICIPATION LIMITATIONS: meal prep, cleaning, laundry, driving, shopping, community activity, yard work, and church  PERSONAL FACTORS: Behavior pattern, Fitness, Past/current experiences, and 3+ comorbidities: depressive disorder, fibromyalgia, anxiety are also affecting patient's functional outcome.   REHAB POTENTIAL: Fair due to multiple co-morbidities and nature of patient's chronic pain condition  CLINICAL DECISION MAKING: Evolving/moderate complexity  EVALUATION COMPLEXITY: Moderate   GOALS: Goals reviewed with patient? Yes  SHORT TERM GOALS: Target date: 07/05/2023  Patient will be independent with initial HEP  Baseline: Goal status: Met on 06/26/23  2.  Pain report to be no greater than 4/10  Baseline:  Goal status: Ongoing   LONG TERM GOALS: Target date: 08/09/2023     Patient to report pain no greater than 2/10  Baseline:  Goal status: INITIAL  2.  Patient will be independent with initial HEP  Baseline:  Goal status: INITIAL  3.  Patient to be able to stand or walk for at least 15 min without leg pain  Baseline:  Goal status: INITIAL  4.  Patient to be able to bend, stoop and squat with pain no greater than 2/10  Baseline:  Goal status: INITIAL  5.  Patient to be able to do all ADL's and IADL's independently and safely Baseline:  Goal status: INITIAL  6.  TUG and 5  times sit to stand scores to improve by 2-3 seconds Baseline:  Goal status: INITIAL   PLAN:  PT FREQUENCY: 2x/week  PT DURATION: 8 weeks  PLANNED INTERVENTIONS: 97110-Therapeutic exercises, 97530- Therapeutic activity, 97112- Neuromuscular re-education, 97535- Self Care, 30865- Manual therapy, [phone]- Gait training, [phone]- Aquatic Therapy, [phone]- Electrical stimulation (unattended), [phone]- Electrical stimulation (manual), 97016- Vasopneumatic device, 97035- Ultrasound, 27253- Ionotophoresis 4mg /ml Dexamethasone , Patient/Family education, Balance training, Stair training, Taping, Dry Needling, Joint mobilization, Spinal mobilization, DME instructions, Cryotherapy, and Moist heat  PLAN FOR NEXT SESSION: Nustep, follow up on home TENS use;  low level hip ROM, submax isometrics secondary to increased pain levels   Darien [name], PT 07/11/23 6:53 PM Phone: [phone] Fax: [phone]  [facility] [address] [place], [place] [address] Phone # [phone] Fax [phone]

## 2023-07-12 ENCOUNTER — Encounter: Admitting: Rehabilitative and Restorative Service Providers"

## 2023-07-13 ENCOUNTER — Other Ambulatory Visit: Payer: Self-pay | Admitting: Behavioral Health

## 2023-07-13 DIAGNOSIS — R454 Irritability and anger: Secondary | ICD-10-CM

## 2023-07-17 ENCOUNTER — Encounter: Admitting: Rehabilitative and Restorative Service Providers"

## 2023-07-17 ENCOUNTER — Other Ambulatory Visit: Payer: Self-pay | Admitting: Behavioral Health

## 2023-07-17 ENCOUNTER — Ambulatory Visit

## 2023-07-17 DIAGNOSIS — F331 Major depressive disorder, recurrent, moderate: Secondary | ICD-10-CM

## 2023-07-17 DIAGNOSIS — F900 Attention-deficit hyperactivity disorder, predominantly inattentive type: Secondary | ICD-10-CM

## 2023-07-17 DIAGNOSIS — F411 Generalized anxiety disorder: Secondary | ICD-10-CM

## 2023-07-18 ENCOUNTER — Ambulatory Visit

## 2023-07-18 DIAGNOSIS — M6281 Muscle weakness (generalized): Secondary | ICD-10-CM | POA: Diagnosis not present

## 2023-07-18 DIAGNOSIS — R252 Cramp and spasm: Secondary | ICD-10-CM | POA: Diagnosis not present

## 2023-07-18 DIAGNOSIS — R262 Difficulty in walking, not elsewhere classified: Secondary | ICD-10-CM | POA: Diagnosis not present

## 2023-07-18 DIAGNOSIS — M25551 Pain in right hip: Secondary | ICD-10-CM

## 2023-07-18 NOTE — Therapy (Signed)
 OUTPATIENT PHYSICAL THERAPY TREATMENT NOTE   Patient Name: Jordan [name] MRN: [id number] DOB:1961/02/09, 63 y.o., female Today's Date: 07/18/2023  END OF SESSION:  PT End of Session - 07/18/23 1152     Visit Number 4    Number of Visits 16    Date for PT Re-Evaluation 08/09/23    Authorization Type [employer] 06/14/23 - 08/09/2023 16 steps    Authorization - Visit Number 4    Authorization - Number of Visits 16    Progress Note Due on Visit 10    PT Start Time 1150    PT Stop Time 1226    PT Time Calculation (min) 36 min    Activity Tolerance Patient tolerated treatment well    Behavior During Therapy WFL for tasks assessed/performed             Past Medical History:  Diagnosis Date   Acute upper respiratory infections of unspecified site    ADD (attention deficit disorder)    Allergic rhinitis due to pollen    Anal fissure    Anemia, unspecified    Anxiety disorder    Asthma    Carpal tunnel syndrome 04/25/2007   Qualifier: Diagnosis of   By: Edmonia Gottron MD, Beauford Bounds        Chicken pox    Complication of anesthesia    wakes up slow   Cough 07/03/2011   Followed in Pulmonary clinic/ [facility]/ [facility]      - Spirometry nl during flare 07/03/2011      Had severe asthmatic bronchitis vs pneumonia requiring hospitalization April '13 for severe bronchospasm. Subsequently had persistent cough and bronchospasm for several months.      Depression, recurrent (HCC) 12/05/2006   Qualifier: Diagnosis of   By: Verdia Glad CMA, Jessica      History or severe depression - followed by Dr. Deborra Falter  Additional diagnosis: ADD, agraphobia, self-mutilation.  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    Iron deficiency anemia 03/24/2021   Lump or mass in breast    Meningitis, unspecified(322.9)    NASAL POLYP 05/14/2009   NECK PAIN, CHRONIC 02/19/2007   Qualifier: Diagnosis of   By: Edmonia Gottron MD, Beauford Bounds        Oral thrush    Other specified glaucoma    Perforation of tympanic membrane, unspecified    Radial styloid tenosynovitis    Seizures (HCC)    TYMPANIC MEMBRANE PERFORATION 12/05/2006   Annotation: REPAIRED  Qualifier: Diagnosis of   By: Verdia Glad CMA, Jessica      Replacing diagnoses that were inactivated after the 05/22/22 regulatory import     Past Surgical History:  Procedure Laterality Date   ADENOIDECTOMY  ANKLE SURGERY  1992   right ankle - scar tissue   DILATION AND CURETTAGE OF UTERUS  2006   History of Menometrorrhagia   INNER EAR SURGERY  1966 to 1986   6 surgeries on right ear   LAPAROSCOPY  1993   normal   REDUCTION MAMMAPLASTY Bilateral 2018   uterine ablation  2011   WRIST SURGERY  2001   for de-quervaine - bilateral wrists - tendonitis   Patient Active Problem List   Diagnosis Date Noted   Thyroid  nodule 04/16/2023   Postmenopausal estrogen deficiency 04/16/2023   Conductive hearing loss of right ear with unrestricted hearing of left ear 04/16/2023   Abnormal colonoscopy 01/05/2023   Iron deficiency 11/13/2022   Hip pain 11/13/2022   Vitamin D  deficiency 04/19/2022   Hypertriglyceridemia 03/24/2021   Low back pain 10/19/2020   Severe recurrent major depression without psychotic features (HCC) 09/27/2015    Class: Chronic   Fibromyalgia 04/03/2013   Back pain, lumbosacral 04/03/2013   Degeneration of cervical intervertebral disc 04/03/2013   Dyspepsia and disorder of function of stomach 03/16/2012   Mild cognitive impairment with  memory loss 01/03/2012   Bilateral dry eyes 05/13/2011   Back pain, thoracic 06/15/2010   MULTIPLE SCLEROSIS, PROGRESSIVE/RELAPSING 06/02/2009   Claustrophobia 04/25/2007   HAY FEVER 02/18/2007   GLAUCOMA NEC 12/05/2006   FIBROCYSTIC BREAST DISEASE 12/05/2006   Headache 12/05/2006    PCP: Anthon Kins, MD  REFERRING PROVIDER: Melville Stade  REFERRING DIAG:  Z61.096 other specified enthesopathies of the right lower limb, excluding the foot  THERAPY DIAG:  Pain in right hip  Muscle weakness (generalized)  Difficulty in walking, not elsewhere classified  Cramp and spasm  Rationale for Evaluation and Treatment: Rehabilitation  ONSET DATE: 05/28/23  SUBJECTIVE:   SUBJECTIVE STATEMENT: Patient reports she is having most of her pain at the right proximal thigh.     PERTINENT HISTORY: See above list for numerous co-morbidities Left hip labral tear and hamstring tears bil Left lesser trochanter excision 04/12/22 had PT at [facility] with Margo Shells good recovery Dizziness in some situations  1992 peroneal nerve surgery right  Surgery 4/7 for right hip enthesopathy- lesser trochanteric excision at [facility]; currently using a single point cane;  As a result of injury 4 years loading kayak and legs got twisted and fell.  Nerve wrapped around femoral head on both left and right hips.  Had same surgery left last year and did well with recovery.    Can walk 1/3 mile 2x/day (slowly) walks the dog Primary school teacher); drags right foot with fatigue; had a Doppler recently negative DVT  PAIN:  07/18/23 Are you having pain?  yes Yes: NPRS scale: 8/10 currently, when I wake up, it's a 10/10 Pain location: right hip Pain description: weakness front thigh, pain groin; lateral hip Aggravating factors: hip flexion needs assist leg in/out of car and to reach shoe; too much activity Relieving factors: lie or sit down, ice  PRECAUTIONS: Other: No lifting > 10#, no running  RED  FLAGS: None   WEIGHT BEARING RESTRICTIONS: No  FALLS:  Has patient fallen in last 6 months? Yes. Number of falls 1 yesterday in the front yard steep incline  LIVING ENVIRONMENT: Lives with: lives alone Lives in: House/apartment Stairs: Yes: External: 2 steps; none Has following equipment at home: Single point cane  OCCUPATION:  disability PLOF: Independent, Independent with basic ADLs, Independent with household mobility without device, Independent with community mobility without device, Independent with  homemaking with ambulation, Independent with gait, and Independent with transfers  PATIENT GOALS:  to be able to get back to being active, and get my life back; play basketball with kids in the neighborhood; swimming; hike and fly fishing; kayaking   NEXT MD VISIT: follow up on 07/10/2023  OBJECTIVE:  Note: Objective measures were completed at Evaluation unless otherwise noted.  DIAGNOSTIC FINDINGS: na  PATIENT SURVEYS:  Eval:  LEFS 23/80  COGNITION: Overall cognitive status: Within functional limits for tasks assessed     SENSATION: Intermittent pins/needles in lower leg/foot  POSTURE: rounded shoulders, forward head, and increased thoracic kyphosis  APPEARANCE: 5 portal incisions posterior hip with sutures intact  LOWER EXTREMITY ROM: Limited active seated and supine right hip flexion   LOWER EXTREMITY MMT:  MMT Right eval Left eval  Hip flexion 2 5  Hip extension 3 5  Hip abduction 2+ 5  Hip adduction    Hip internal rotation    Hip external rotation 3 5  Knee flexion    Knee extension 3 5  Ankle dorsiflexion    Ankle plantarflexion    Ankle inversion    Ankle eversion     (Blank rows = not tested)  FUNCTIONAL TESTS:  Eval: 5 times sit to stand: 24,36 no hands, shifted more to the left Timed up and go (TUG): 16.98 3 MWT 370 feet with single point cane  GAIT: Comments: decreased stance time on right; decreased hip extension; pelvic drop with  fatigue                                                                                                                                TREATMENT DATE:  DATE: 07/18/23 Seated piriformis stretch 3 x 30 sec Standing hip flexor/quad stretch 3 x 30 sec Lateral band walks with blue loop 3 laps of 10 steps each way Hook lying clam 2 x 10 with blue loop Side lying clam 2 x 10 each LE with blue loop Quadruped hip extension 2 x 10 Quadruped fire hydrant 2 x 10 Pelvic rocks supine 10x Trigger Point Dry Needling Initial Treatment: Pt instructed on Dry Needling rational, procedures, and possible side effects. Pt instructed to expect mild to moderate muscle soreness later in the day and/or into the next day.  Pt instructed in methods to reduce muscle soreness. Pt instructed to continue prescribed HEP. Because Dry Needling was performed over or adjacent to a lung field, pt was educated on S/S of pneumothorax and to seek immediate medical attention should they occur.  Patient was educated on signs and symptoms of infection and other risk factors and advised to seek medical attention should they occur.  Patient verbalized understanding of these instructions and education.  Patient Verbal Consent Given: Yes Education Handout Provided: Previously Provided Muscles Treated: right lateral quad Electrical Stimulation Performed: No Treatment Response/Outcome: Skilled palpation used to identify taut bands and trigger points.  Once identified, dry needling techniques  used to treat these areas.  Deep ache response ellicited along with palpable decreased spasm of the right lateral quad.  Following treatment, patient reported relief of tightness.     DATE: 07/11/23 Nustep x 8 min level 5 ES IFC  right groin, anterior thigh, lateral hip 9.2 ma 20 min partly concurrent with ex: Supine SAQ 10x 2 Supine HS isometric 10x Pelvic rocks supine 10x Supine ball squeeze 10x Supine clams green band isometric 10x Seated  quad submax isometric push into ball Pt has a TENS unit at home but hasn't used in a while, discussed trial at home for pain control (pt given electrodes to take home)  06/26/2023 Nustep level 5 x6 min with PT present to discuss status Standing hamstring stretch at stairs 2x20 sec bilat Quadruped backwards rocking 2x10 Supine hip adduction ball squeeze 2x10 Supine clamshells with yellow loop 2x10 Supine marching with yellow loop around knees 2x10 Supine straight leg raise 2x10 bilat Supine heel slide with foot on slider 2x10 right LE Sit to stand from PT mat with elevated foam pad seat:  2x10 FWD step ups on 4" step with unilateral UE support x7 bilat   06/14/23 Initial eval completed and initiated HEP    PATIENT EDUCATION:  Education details: Initiated HEP Person educated: Patient Education method: Programmer, multimedia, Facilities manager, Verbal cues, and Handouts Education comprehension: verbalized understanding, returned demonstration, and verbal cues required  HOME EXERCISE PROGRAM: Access Code: [email or web address] URL: [email or web address] Date: 06/14/2023 Prepared by: Darien [name]  Exercises - Quadruped Rocking Backward  - 1 x daily - 7 x weekly - 1 sets - 10 reps - Supine Pelvic Tilt  - 1 x daily - 7 x weekly - 1 sets - 10 reps - Supine Hip Abduction AROM  - 1 x daily - 7 x weekly - 1 sets - 10 reps - Clamshell  - 1 x daily - 7 x weekly - 1 sets - 10 reps  ASSESSMENT:  CLINICAL IMPRESSION: Maansi was able to do several challenging exercises with minimal c/o pain.  She demonstrates good technique and no need for frequent rest breaks.  We tried DN today on the lateral quad to see if this would alleviate the tightness she feels there.  She did not have any twitch responses but did feel some deep ache at the distal portion of the lateral quad.  She would benefit from continuing skilled PT to address goals stated below.    OBJECTIVE IMPAIRMENTS: Abnormal gait, difficulty walking,  decreased ROM, decreased strength, hypomobility, increased fascial restrictions, increased muscle spasms, impaired flexibility, postural dysfunction, and pain.   ACTIVITY LIMITATIONS: carrying, lifting, bending, sitting, standing, squatting, sleeping, stairs, transfers, bed mobility, bathing, toileting, dressing, and caring for others  PARTICIPATION LIMITATIONS: meal prep, cleaning, laundry, driving, shopping, community activity, yard work, and church  PERSONAL FACTORS: Behavior pattern, Fitness, Past/current experiences, and 3+ comorbidities: depressive disorder, fibromyalgia, anxiety are also affecting patient's functional outcome.   REHAB POTENTIAL: Fair due to multiple co-morbidities and nature of patient's chronic pain condition  CLINICAL DECISION MAKING: Evolving/moderate complexity  EVALUATION COMPLEXITY: Moderate   GOALS: Goals reviewed with patient? Yes  SHORT TERM GOALS: Target date: 07/05/2023  Patient will be independent with initial HEP  Baseline: Goal status: Met on 06/26/23  2.  Pain report to be no greater than 4/10  Baseline:  Goal status: Ongoing   LONG TERM GOALS: Target date: 08/09/2023  Patient to report pain no greater than 2/10  Baseline:  Goal status: INITIAL  2.  Patient  will be independent with initial HEP  Baseline:  Goal status: INITIAL  3.  Patient to be able to stand or walk for at least 15 min without leg pain  Baseline:  Goal status: INITIAL  4.  Patient to be able to bend, stoop and squat with pain no greater than 2/10  Baseline:  Goal status: INITIAL  5.  Patient to be able to do all ADL's and IADL's independently and safely Baseline:  Goal status: INITIAL  6.  TUG and 5 times sit to stand scores to improve by 2-3 seconds Baseline:  Goal status: INITIAL   PLAN:  PT FREQUENCY: 2x/week  PT DURATION: 8 weeks  PLANNED INTERVENTIONS: 97110-Therapeutic exercises, 97530- Therapeutic activity, [id number]- Neuromuscular re-education,  97535- Self Care, 69629- Manual therapy, [id number]- Gait training, [phone]- Aquatic Therapy, [phone]- Electrical stimulation (unattended), [phone]- Electrical stimulation (manual), 97016- Vasopneumatic device, [id number]- Ultrasound, [id number]- Ionotophoresis 4mg /ml Dexamethasone , Patient/Family education, Balance training, Stair training, Taping, Dry Needling, Joint mobilization, Spinal mobilization, DME instructions, Cryotherapy, and Moist heat  PLAN FOR NEXT SESSION: Nustep, DN again if effective, follow up on home TENS use;  low level hip ROM, submax isometrics secondary to increased pain levels  Rebbie Lauricella B. Bryton Romagnoli, PT 07/18/23 12:37 PM [facility] [address], [address] [place], [place] [address] Phone # [phone] Fax [phone]

## 2023-07-19 ENCOUNTER — Encounter: Admitting: Rehabilitative and Restorative Service Providers"

## 2023-07-22 ENCOUNTER — Other Ambulatory Visit: Payer: Self-pay | Admitting: Gastroenterology

## 2023-07-23 ENCOUNTER — Encounter: Admitting: Rehabilitative and Restorative Service Providers"

## 2023-07-24 ENCOUNTER — Ambulatory Visit: Attending: Surgery

## 2023-07-24 DIAGNOSIS — R262 Difficulty in walking, not elsewhere classified: Secondary | ICD-10-CM | POA: Insufficient documentation

## 2023-07-24 DIAGNOSIS — R252 Cramp and spasm: Secondary | ICD-10-CM | POA: Insufficient documentation

## 2023-07-24 DIAGNOSIS — M6281 Muscle weakness (generalized): Secondary | ICD-10-CM | POA: Insufficient documentation

## 2023-07-24 DIAGNOSIS — M25551 Pain in right hip: Secondary | ICD-10-CM | POA: Insufficient documentation

## 2023-07-26 ENCOUNTER — Ambulatory Visit

## 2023-07-26 ENCOUNTER — Encounter: Admitting: Rehabilitative and Restorative Service Providers"

## 2023-07-30 ENCOUNTER — Encounter: Admitting: Rehabilitative and Restorative Service Providers"

## 2023-07-31 ENCOUNTER — Ambulatory Visit

## 2023-08-02 ENCOUNTER — Encounter: Admitting: Rehabilitative and Restorative Service Providers"

## 2023-08-02 ENCOUNTER — Ambulatory Visit: Admitting: Physical Therapy

## 2023-08-07 ENCOUNTER — Ambulatory Visit

## 2023-08-09 ENCOUNTER — Telehealth: Payer: Self-pay | Admitting: Physical Therapy

## 2023-08-09 ENCOUNTER — Ambulatory Visit: Admitting: Physical Therapy

## 2023-08-09 NOTE — Telephone Encounter (Signed)
 Left message on voicemail regarding missed appt today and reminded of next appt on 6/24 at 10:15

## 2023-08-14 ENCOUNTER — Other Ambulatory Visit: Payer: Self-pay | Admitting: Behavioral Health

## 2023-08-14 ENCOUNTER — Ambulatory Visit: Admitting: Physical Therapy

## 2023-08-14 DIAGNOSIS — F331 Major depressive disorder, recurrent, moderate: Secondary | ICD-10-CM

## 2023-08-14 DIAGNOSIS — F900 Attention-deficit hyperactivity disorder, predominantly inattentive type: Secondary | ICD-10-CM

## 2023-08-14 DIAGNOSIS — F411 Generalized anxiety disorder: Secondary | ICD-10-CM

## 2023-08-15 ENCOUNTER — Ambulatory Visit (INDEPENDENT_AMBULATORY_CARE_PROVIDER_SITE_OTHER): Admitting: Family

## 2023-08-15 VITALS — BP 120/62 | HR 71 | Temp 98.1°F | Ht 63.0 in | Wt 128.4 lb

## 2023-08-15 DIAGNOSIS — D508 Other iron deficiency anemias: Secondary | ICD-10-CM | POA: Diagnosis not present

## 2023-08-15 NOTE — Progress Notes (Signed)
 Patient ID: Jordan [name], female    DOB: 04/19/1960, 63 y.o.   MRN: [id number]  Chief Complaint  Patient presents with   Fatigue    Started in late May. Tried taking Iron OTC and it wasn't helping.   Discussed the use of AI scribe software for clinical note transcription with the patient, who gave verbal consent to proceed.  History of Present Illness Jordan [name] is a 63 year old female with iron deficiency anemia who presents with severe fatigue and sleep disturbances.  She experiences severe fatigue, particularly in the mornings, despite daily vitamin D  and OTC iron intake and recent dietary changes to include meat. Her last blood work in February showed normal vitamin D , ferritin, and hemoglobin levels, but she reports having hip surgery thru [facility] in May and told her iron level was down. She takes over-the-counter iron supplements, but not daily due to causing constipation and dark stools. She has sleep disturbances, including waking up at night, which is unusual for her. She takes Xanax  at night but avoids trazodone  with it due to its sedative effects lasting into the morning. She also takes buspirone  and Cymbalta . Despite these medications, she lacks energy even after a good night's sleep. She has a history of depression but does not feel more depressed than usual. She believes her current fatigue is related to her iron levels rather than depression.  Assessment & Plan Fatigue Severe fatigue persists despite OTC iron and vitamin D  supplementation. Inadequate sleep suspected as a factor. - Order complete blood count to reassess iron level post-surgery. - Discuss alternative sleep medications to improve sleep quality. - Return to exercise as directed by surgeon - Hydrate with at least 2L water daily, eat low carb diet, w/minimal sweets  Sleep Disturbance Difficulty sleeping with current medication regimen. Xanax  and trazodone  combination excessively sedating. - Discuss  alternative sleep medications to replace trazodone  and Xanax .  Iron Deficiency Anemia Managed with supplementation. Told by [facility] she had low iron after surgery last month. Discontinued iron due to constipation. - Recommend slow-release iron supplement at 25-45 mg daily to reduce constipation. - Hydrate with at least 2L water qd, add fiber to diet - Check CBC today. - F/U with PCP and ortho surgeon prn  Post-Surgical Recovery Pain resolved post-hip surgery. Engaging in physical therapy and light exercise. - Encourage continuation of physical therapy and light exercise to aid recovery.   Subjective:    Outpatient Medications Prior to Visit  Medication Sig Dispense Refill   ALPRAZolam  (XANAX ) 0.5 MG tablet TAKE ONE TABLET BY MOUTH TWICE A DAY AS NEEDED FOR ANXIETY 60 tablet 5   busPIRone  (BUSPAR ) 30 MG tablet Take 1 tablet (30 mg total) by mouth 2 (two) times daily. 60 tablet 3   Cholecalciferol (VITAMIN D3) 125 MCG (5000 UT) CAPS Take 1 capsule (5,000 Units total) by mouth daily. 90 capsule 3   dicyclomine  (BENTYL ) 10 MG capsule TAKE ONE CAPSULE BY MOUTH EVERY 6 HOURS AS NEEDED FOR ABDOMINAL PAIN 30 capsule 1   DULoxetine  (CYMBALTA ) 60 MG capsule TAKE 1 CAPSULE BY MOUTH TWICE  DAILY 200 capsule 1   esomeprazole  (NEXIUM ) 40 MG capsule TAKE 1 CAPSULE BY MOUTH TWICE  DAILY BEFORE MEALS 200 capsule 1   fluticasone  (FLONASE ) 50 MCG/ACT nasal spray SPRAY 1 SPRAY IN EACH NOSTRIL ONCE DAILY AS NEEDED FOR ALLERGIES OR RHINITIS 16 mL 2   lamoTRIgine  (LAMICTAL ) 25 MG tablet TAKE 1 TABLET BY MOUTH 2 TIMES A DAY 60 tablet  1   traZODone  (DESYREL ) 50 MG tablet Take 1 tablet (50 mg total) by mouth at bedtime. 90 tablet 0   tiZANidine (ZANAFLEX) 2 MG tablet TAKE 1 TABLET BY MOUTH 3 TIMES A DAY AS NEEDED FOR PAIN/SPASM (Patient not taking: Reported on 08/15/2023)     No facility-administered medications prior to visit.   Past Medical History:  Diagnosis Date   Acute upper respiratory infections of  unspecified site    ADD (attention deficit disorder)    Allergic rhinitis due to pollen    Anal fissure    Anemia, unspecified    Anxiety disorder    Asthma    Carpal tunnel syndrome 04/25/2007   Qualifier: Diagnosis of   By: Harlow MD, Ozell BRAVO        Chicken pox    Complication of anesthesia    wakes up slow   Cough 07/03/2011   Followed in Pulmonary clinic/ [facility]/ [facility]      - Spirometry nl during flare 07/03/2011      Had severe asthmatic bronchitis vs pneumonia requiring hospitalization April '13 for severe bronchospasm. Subsequently had persistent cough and bronchospasm for several months.      Depression, recurrent (HCC) 12/05/2006   Qualifier: Diagnosis of   By: Lang CMA, Jessica      History or severe depression - followed by Dr. Vincente  Additional diagnosis: ADD, agraphobia, self-mutilation.  Med added - abilify     Depressive disorder, not elsewhere classified    Diffuse cystic mastopathy    Falls    Fibromyalgia    GERD (gastroesophageal reflux disease)    Headache(784.0)    History of anorexia nervosa    History of bulimia    IBS (irritable bowel syndrome)    IGT (impaired glucose tolerance) 03/24/2021   Lab Results      Component    Value    Date           HGBA1C    5.3    04/13/2022           Incontinence without sensory awareness 06/01/2009   Qualifier: Diagnosis of   By: Harlow MD, Ozell BRAVO        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    Iron deficiency anemia 03/24/2021   Lump or mass in breast    Meningitis, unspecified(322.9)    NASAL POLYP 05/14/2009   NECK PAIN, CHRONIC 02/19/2007   Qualifier: Diagnosis of   By: Harlow MD, Ozell BRAVO        Oral thrush    Other specified glaucoma    Perforation of tympanic membrane, unspecified    Radial styloid tenosynovitis    Seizures (HCC)    TYMPANIC MEMBRANE PERFORATION 12/05/2006   Annotation: REPAIRED  Qualifier: Diagnosis of   By: Lang CMA, Jessica      Replacing diagnoses that were inactivated after the  05/22/22 regulatory import     Past Surgical History:  Procedure Laterality Date   ADENOIDECTOMY     ANKLE SURGERY  1992   right ankle - scar tissue   DILATION AND CURETTAGE OF UTERUS  2006   History of Menometrorrhagia   INNER EAR SURGERY  1966 to 1986   6 surgeries on right ear   LAPAROSCOPY  1993   normal   REDUCTION MAMMAPLASTY Bilateral 2018   uterine ablation  2011   WRIST SURGERY  2001   for de-quervaine - bilateral wrists - tendonitis   Allergies  Allergen Reactions   Gabapentin  Other (  See Comments)   Mirtazapine Other (See Comments)    Severe hallucinations   Prednisone  Rash    Face redness and swollen per patient    Pregabalin Other (See Comments)    Delusions   Oxycodone Other (See Comments)    Severe hallucinations   Oxycodone Hcl       Objective:    Physical Exam Vitals and nursing note reviewed.  Constitutional:      Appearance: Normal appearance.   Cardiovascular:     Rate and Rhythm: Normal rate and regular rhythm.  Pulmonary:     Effort: Pulmonary effort is normal.     Breath sounds: Normal breath sounds.   Musculoskeletal:        General: Normal range of motion.   Skin:    General: Skin is warm and dry.   Neurological:     Mental Status: She is alert.   Psychiatric:        Mood and Affect: Mood normal.        Behavior: Behavior normal.    BP 120/62   Pulse 71   Temp 98.1 F (36.7 C) (Temporal)   Ht 5' 3 (1.6 m)   Wt 128 lb 6.4 oz (58.2 kg)   SpO2 97%   BMI 22.75 kg/m  Wt Readings from Last 3 Encounters:  08/15/23 128 lb 6.4 oz (58.2 kg)  04/16/23 127 lb 6.4 oz (57.8 kg)  03/20/23 126 lb (57.2 kg)       Lucius Krabbe, NP

## 2023-08-16 ENCOUNTER — Ambulatory Visit: Payer: Self-pay | Admitting: Family

## 2023-08-16 ENCOUNTER — Ambulatory Visit

## 2023-08-16 LAB — CBC WITH DIFFERENTIAL/PLATELET
Basophils Absolute: 0.1 10*3/uL (ref 0.0–0.1)
Basophils Relative: 1.1 % (ref 0.0–3.0)
Eosinophils Absolute: 0.1 10*3/uL (ref 0.0–0.7)
Eosinophils Relative: 2.5 % (ref 0.0–5.0)
HCT: 41.9 % (ref 36.0–46.0)
Hemoglobin: 14.3 g/dL (ref 12.0–15.0)
Lymphocytes Relative: 27.7 % (ref 12.0–46.0)
Lymphs Abs: 1.6 10*3/uL (ref 0.7–4.0)
MCHC: 34 g/dL (ref 30.0–36.0)
MCV: 96.2 fl (ref 78.0–100.0)
Monocytes Absolute: 0.4 10*3/uL (ref 0.1–1.0)
Monocytes Relative: 7.5 % (ref 3.0–12.0)
Neutro Abs: 3.5 10*3/uL (ref 1.4–7.7)
Neutrophils Relative %: 61.2 % (ref 43.0–77.0)
Platelets: 190 10*3/uL (ref 150.0–400.0)
RBC: 4.36 Mil/uL (ref 3.87–5.11)
RDW: 13.2 % (ref 11.5–15.5)
WBC: 5.7 10*3/uL (ref 4.0–10.5)

## 2023-08-18 ENCOUNTER — Other Ambulatory Visit: Payer: Self-pay | Admitting: Behavioral Health

## 2023-08-18 DIAGNOSIS — F900 Attention-deficit hyperactivity disorder, predominantly inattentive type: Secondary | ICD-10-CM

## 2023-08-18 DIAGNOSIS — F331 Major depressive disorder, recurrent, moderate: Secondary | ICD-10-CM

## 2023-08-18 DIAGNOSIS — F411 Generalized anxiety disorder: Secondary | ICD-10-CM

## 2023-08-21 ENCOUNTER — Ambulatory Visit: Admitting: Physical Therapy

## 2023-08-21 ENCOUNTER — Ambulatory Visit: Admitting: Behavioral Health

## 2023-08-23 ENCOUNTER — Encounter: Admitting: Physical Therapy

## 2023-08-24 DIAGNOSIS — S0993XA Unspecified injury of face, initial encounter: Secondary | ICD-10-CM | POA: Diagnosis not present

## 2023-08-24 DIAGNOSIS — S0083XA Contusion of other part of head, initial encounter: Secondary | ICD-10-CM | POA: Diagnosis not present

## 2023-08-29 ENCOUNTER — Ambulatory Visit: Admitting: Family Medicine

## 2023-08-29 NOTE — Progress Notes (Deleted)
   Jordan [name] is a 63 y.o. female who presents today for an office visit.  Assessment/Plan:  New/Acute Problems: ***  Chronic Problems Addressed Today: No problem-specific Assessment & Plan notes found for this encounter.     Subjective:  HPI:  Patient here for urgent care follow-up.  She went to the urgent care 5 days ago after falling out of bed after having a dream that she was being hit by a train.  She landed on her nightstand.  Felt a bump on her head and applied ice to the area.  Was not having any other symptoms at that time.  In urgent care had plain film performed which did not show any acute fractures.       Objective:  Physical Exam: There were no vitals taken for this visit.  Gen: No acute distress, resting comfortably*** CV: Regular rate and rhythm with no murmurs appreciated Pulm: Normal work of breathing, clear to auscultation bilaterally with no crackles, wheezes, or rhonchi Neuro: Grossly normal, moves all extremities Psych: Normal affect and thought content      Jordan Pietila M. Kennyth, MD 08/29/2023 8:45 AM

## 2023-08-31 ENCOUNTER — Ambulatory Visit (INDEPENDENT_AMBULATORY_CARE_PROVIDER_SITE_OTHER): Admitting: Internal Medicine

## 2023-08-31 ENCOUNTER — Encounter: Payer: Self-pay | Admitting: Internal Medicine

## 2023-08-31 VITALS — BP 120/72 | HR 83 | Temp 98.2°F | Ht 63.0 in | Wt 128.6 lb

## 2023-08-31 DIAGNOSIS — S0285XB Fracture of orbit, unspecified, initial encounter for open fracture: Secondary | ICD-10-CM | POA: Diagnosis not present

## 2023-08-31 DIAGNOSIS — G4759 Other parasomnia: Secondary | ICD-10-CM | POA: Diagnosis not present
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
## 2023-08-31 NOTE — Patient Instructions (Signed)
 After Visit Summary: Care Instructions for Periorbital (Orbital) Fracture What Happened: You have a fracture (break) in the bones around your eye. This is called a periorbital or orbital fracture. Your eye exam today did not show any trapped muscles or bones pushing the eye out of place. You have mild blurry vision, swelling, and bruising. What We Are Doing: We have ordered a CT scan to get a clearer picture of the injury. We have asked an eye specialist (ophthalmologist) to see you for further evaluation. How to Care for Yourself at Home: Ice: Place an ice pack gently on the area for 15-20 minutes every 1-2 hours for the first 1-2 days to reduce swelling. Elevate Your Head: Sleep with your head up on extra pillows to help decrease swelling. Pain Relief: Take acetaminophen  (Tylenol ) or ibuprofen  as needed for pain (unless told otherwise by your doctor). Do Not Blow Your Nose: This can force air into the area around your eye and cause complications. Rest: Avoid strenuous activity or contact sports until cleared by your doctor. Monitor Your Vision: If you notice any sudden changes in your vision, double vision that does not go away, severe pain, or if your eye starts to bulge or sink in, seek medical attention right away. What to Watch For - Call or Return If You Have: New or worsening vision changes (blurry vision, double vision, loss of vision) Severe or increasing pain Eye bulging or sinking in Numbness or tingling that is new or getting worse Nausea, vomiting, dizziness, or fainting Signs of infection (increasing redness, warmth, pus, or fever) Follow-Up: Attend your scheduled appointments for the CT scan and with the eye specialist. Keep any follow-up appointments as advised. Questions? If you have any concerns or symptoms listed above, contact our office or go to the nearest emergency department.  Your health and safety are our priority. Take care and follow these instructions until  your next appointment.

## 2023-09-01 ENCOUNTER — Encounter: Payer: Self-pay | Admitting: Internal Medicine

## 2023-09-01 DIAGNOSIS — S0285XB Fracture of orbit, unspecified, initial encounter for open fracture: Secondary | ICD-10-CM | POA: Insufficient documentation

## 2023-09-01 DIAGNOSIS — G4759 Other parasomnia: Secondary | ICD-10-CM | POA: Insufficient documentation

## 2023-09-01 NOTE — Assessment & Plan Note (Signed)
 Medication-induced vivid dreams   Vivid dreams, likely due to psychoactive medications (Trazodone , Xanax , Cymbalta , Buspar ), led to a fall and injury. Medication adjustments with her psychiatrist are advised to prevent recurrence. The risk of recurrence is high without adjustments or protective measures. Discuss medication adjustments with her psychiatrist to reduce the risk of vivid dreams. Consider reducing or discontinuing one or more psychoactive medications. Implement protective measures such as placing pillows around the bed.

## 2023-09-01 NOTE — Assessment & Plan Note (Signed)
 She sustained a periorbital fracture after falling out of bed during a vivid dream, resulting in visible bruising and bone asymmetry around the left eyebrow. Mild blurriness in vision is likely due to swelling. The fracture is not serious, but vision protection is necessary. Swelling should decrease with ice packs, and the fracture is likely stable. A CT scan is required to rule out an orbital floor blowout or unstable fracture. The risk of complications, such as a bone shard affecting the eye, is low but must be excluded. Order a CT scan of orbits and facial bones. Refer to an ophthalmologist for further evaluation. Advise using ice packs to reduce swelling. Instruct her to monitor vision and seek emergency care if vision worsens. Provide a handout with instructions to keep her head elevated, avoid blowing her nose, and monitor for signs of infection or eye bulging.

## 2023-09-03 ENCOUNTER — Ambulatory Visit (HOSPITAL_BASED_OUTPATIENT_CLINIC_OR_DEPARTMENT_OTHER)
Admission: RE | Admit: 2023-09-03 | Discharge: 2023-09-03 | Disposition: A | Discharge status: Home / Self Care | Source: Ambulatory Visit | Attending: Internal Medicine | Admitting: Internal Medicine

## 2023-09-03 ENCOUNTER — Ambulatory Visit: Payer: Self-pay | Admitting: Internal Medicine

## 2023-09-03 DIAGNOSIS — S0285XB Fracture of orbit, unspecified, initial encounter for open fracture: Secondary | ICD-10-CM | POA: Insufficient documentation

## 2023-09-03 DIAGNOSIS — J320 Chronic maxillary sinusitis: Secondary | ICD-10-CM | POA: Diagnosis not present

## 2023-09-03 DIAGNOSIS — S0590XA Unspecified injury of unspecified eye and orbit, initial encounter: Secondary | ICD-10-CM | POA: Diagnosis not present

## 2023-09-03 NOTE — Progress Notes (Signed)
 Great news:  no fracture visible on CT.. so if there was cracked bone its hairline crack.    Still, with vision changes I recommend follow up with eye specialist to check pressure in the eyeball. That stat referral needs to happen soon unless vision has normalized.

## 2023-09-04 ENCOUNTER — Encounter: Payer: Self-pay | Admitting: Internal Medicine

## 2023-09-07 ENCOUNTER — Ambulatory Visit: Admitting: Behavioral Health

## 2023-09-07 ENCOUNTER — Encounter: Payer: Self-pay | Admitting: Behavioral Health

## 2023-09-07 ENCOUNTER — Encounter: Payer: Self-pay | Admitting: Advanced Practice Midwife

## 2023-09-07 DIAGNOSIS — R454 Irritability and anger: Secondary | ICD-10-CM

## 2023-09-07 DIAGNOSIS — F331 Major depressive disorder, recurrent, moderate: Secondary | ICD-10-CM

## 2023-09-07 DIAGNOSIS — F411 Generalized anxiety disorder: Secondary | ICD-10-CM

## 2023-09-07 DIAGNOSIS — F900 Attention-deficit hyperactivity disorder, predominantly inattentive type: Secondary | ICD-10-CM | POA: Diagnosis not present

## 2023-09-07 MED ORDER — DULOXETINE HCL 60 MG PO CPEP: 60.0000 mg | ORAL_CAPSULE | Freq: Two times a day (BID) | ORAL | 1 refills | Status: DC

## 2023-09-07 MED ORDER — ALPRAZOLAM 0.5 MG PO TABS: ORAL_TABLET | ORAL | 5 refills | Status: DC

## 2023-09-07 MED ORDER — LAMOTRIGINE 25 MG PO TABS: 25.0000 mg | ORAL_TABLET | Freq: Two times a day (BID) | ORAL | 1 refills | Status: DC

## 2023-09-07 MED ORDER — TRAZODONE HCL 50 MG PO TABS
50.0000 mg | ORAL_TABLET | Freq: Every day | ORAL | 0 refills | Status: DC
Start: 2023-09-07 — End: 2023-09-25

## 2023-09-07 MED ORDER — BUSPIRONE HCL 30 MG PO TABS: 30.0000 mg | ORAL_TABLET | Freq: Two times a day (BID) | ORAL | 3 refills | Status: DC

## 2023-09-07 NOTE — Progress Notes (Signed)
 [facility]  Patient ID: Jordan [name],  MRN: [id number]  PCP: Jordan Bernardino MATSU, MD  Date of Evaluation: 09/07/2023 Time spent:30 minutes  Chief Complaint:  Chief Complaint   Anxiety; Depression; Follow-up; Patient Education; Medication Refill; Medication Problem     HISTORY/CURRENT STATUS: HPI   Jordan [name], 63 year old patient presents to this office face to face visit for  follow up and medication management.  She is doing OK. Struggling with some boredom and adapting to life changes. She will be traveling to [place]  again end of summer. Still having some breakthrough anxiety during day and requesting small increase.  Reporting no depression. Says her depression today is 0/10 and anxiety 2/10. She is sleeping 7-8 hours per night. She denies any current or prior mania.No psychosis, no auditory or visual hallucinations. Strong family hx of dementia. No SI or HI.   Prior psychiatric medication trials:   Abilify Latuda Risperdone Amitriptyline  Trintellix Lexapro Prozac Zoloft Paxil Wellbutrin  Remeron Effexor Cymbalta  Rexulti Individual Medical History/ Review of Systems: Changes? :No   Allergies: Gabapentin , Mirtazapine, Prednisone , Pregabalin, Oxycodone, and Oxycodone hcl  Current Medications:  Current Outpatient Medications:    ALPRAZolam  (XANAX ) 0.5 MG tablet, Take one tablet by mouth twice daily and an additional 1/2 tablet as needed for severe anxiety., Disp: 75 tablet, Rfl: 5   busPIRone  (BUSPAR ) 30 MG tablet, Take 1 tablet (30 mg total) by mouth 2 (two) times daily., Disp: 60 tablet, Rfl: 3   Cholecalciferol (VITAMIN D3) 125 MCG (5000 UT) CAPS, Take 1 capsule (5,000 Units total) by mouth daily., Disp: 90 capsule, Rfl: 3   dicyclomine  (BENTYL ) 10 MG capsule, TAKE ONE CAPSULE BY MOUTH EVERY 6 HOURS AS NEEDED FOR ABDOMINAL PAIN, Disp: 30 capsule, Rfl: 1   DULoxetine  (CYMBALTA ) 60 MG capsule, Take 1 capsule (60 mg total) by mouth 2 (two) times daily., Disp:  200 capsule, Rfl: 1   esomeprazole  (NEXIUM ) 40 MG capsule, TAKE 1 CAPSULE BY MOUTH TWICE  DAILY BEFORE MEALS, Disp: 200 capsule, Rfl: 1   fluticasone  (FLONASE ) 50 MCG/ACT nasal spray, SPRAY 1 SPRAY IN EACH NOSTRIL ONCE DAILY AS NEEDED FOR ALLERGIES OR RHINITIS, Disp: 16 mL, Rfl: 2   lamoTRIgine  (LAMICTAL ) 25 MG tablet, Take 1 tablet (25 mg total) by mouth 2 (two) times daily., Disp: 60 tablet, Rfl: 1   tiZANidine (ZANAFLEX) 2 MG tablet, TAKE 1 TABLET BY MOUTH 3 TIMES A DAY AS NEEDED FOR PAIN/SPASM (Patient not taking: Reported on 08/15/2023), Disp: , Rfl:    traZODone  (DESYREL ) 50 MG tablet, Take 1 tablet (50 mg total) by mouth at bedtime., Disp: 90 tablet, Rfl: 0 Medication Side Effects: none  Family Medical/ Social History: Changes? No  MENTAL HEALTH EXAM:  There were no vitals taken for this visit.There is no height or weight on file to calculate BMI.  General Appearance: Casual and Neat  Eye Contact:  Good  Speech:  Clear and Coherent  Volume:  Normal  Mood:  NA  Affect:  Appropriate  Thought Process:  Coherent  Orientation:  Full (Time, Place, and Person)  Thought Content: Logical   Suicidal Thoughts:  No  Homicidal Thoughts:  No  Memory:  WNL  Judgement:  Good  Insight:  Good  Psychomotor Activity:  Normal  Concentration:  Concentration: Good  Recall:  Good  Fund of Knowledge: Good  Language: Good  Assets:  Desire for Improvement  ADL's:  Intact  Cognition: WNL  Prognosis:  Good    DIAGNOSES:    ICD-10-CM  1. Generalized anxiety disorder  F41.1 ALPRAZolam  (XANAX ) 0.5 MG tablet    busPIRone  (BUSPAR ) 30 MG tablet    traZODone  (DESYREL ) 50 MG tablet    DULoxetine  (CYMBALTA ) 60 MG capsule    2. Irritability  R45.4 ALPRAZolam  (XANAX ) 0.5 MG tablet    lamoTRIgine  (LAMICTAL ) 25 MG tablet    3. Major depressive disorder, recurrent episode, moderate (HCC)  F33.1 busPIRone  (BUSPAR ) 30 MG tablet    traZODone  (DESYREL ) 50 MG tablet    DULoxetine  (CYMBALTA ) 60 MG capsule     4. Attention deficit hyperactivity disorder (ADHD), predominantly inattentive type  F90.0 traZODone  (DESYREL ) 50 MG tablet      Receiving Psychotherapy: No    RECOMMENDATIONS:   Greater than 50% of 30 face to face time with patient was spent on counseling and coordination of care. We discussed her current stability. Smiling today. Still experiencing some intermittent breakthrough anxiety.  Request no changes to medication this visit.    We agreed to:   Will continue Lamictal  50 mg daily Will continue Cymbalta  60 mg twice daily To continue Trazodone  50 mg at bedtime as reported by pt Continue Buspar  to 30 mg twice daily, at least 7-8 hours between doses. Increase Xanax  0.5 mg twice daily and additional 1/2 tablet for severe anxiety only.  To report worsening symptoms or side effects promptly Will follow up in 3 months to reassess Provided emergency contact information Discussed potential metabolic side effects associated with atypical antipsychotics, as well as potential risk for movement side effects. Advised pt to contact office if movement side effects occur.   Reviewed PDMP           [name], NP

## 2023-09-12 ENCOUNTER — Other Ambulatory Visit: Payer: Self-pay | Admitting: Internal Medicine

## 2023-09-12 DIAGNOSIS — E559 Vitamin D deficiency, unspecified: Secondary | ICD-10-CM

## 2023-09-12 NOTE — Progress Notes (Signed)
 [facility] Quality Team Note  Name: Jordan [name] Date of Birth: April 06, 1960 MRN: [id number] Date: 09/12/2023  [facility] Quality Team has reviewed this patient's chart, please see recommendations below:  [facility] Quality Other; (Chart reviewed for [facility]. Mammogram ordered but not completed. Outreach to office contact completed)

## 2023-09-18 ENCOUNTER — Ambulatory Visit: Admitting: Psychology

## 2023-09-24 ENCOUNTER — Other Ambulatory Visit: Payer: Self-pay | Admitting: Behavioral Health

## 2023-09-24 DIAGNOSIS — F331 Major depressive disorder, recurrent, moderate: Secondary | ICD-10-CM

## 2023-09-24 DIAGNOSIS — F411 Generalized anxiety disorder: Secondary | ICD-10-CM

## 2023-09-24 DIAGNOSIS — F900 Attention-deficit hyperactivity disorder, predominantly inattentive type: Secondary | ICD-10-CM

## 2023-10-04 ENCOUNTER — Ambulatory Visit (INDEPENDENT_AMBULATORY_CARE_PROVIDER_SITE_OTHER): Admitting: Psychology

## 2023-10-04 ENCOUNTER — Ambulatory Visit: Payer: Self-pay

## 2023-10-04 DIAGNOSIS — R296 Repeated falls: Secondary | ICD-10-CM

## 2023-10-04 DIAGNOSIS — R519 Headache, unspecified: Secondary | ICD-10-CM

## 2023-10-04 DIAGNOSIS — R42 Dizziness and giddiness: Secondary | ICD-10-CM

## 2023-10-04 DIAGNOSIS — F331 Major depressive disorder, recurrent, moderate: Secondary | ICD-10-CM | POA: Diagnosis not present

## 2023-10-04 NOTE — Telephone Encounter (Signed)
 FYI Only or Action Required?: FYI only for provider.  Patient was last seen in primary care on 08/31/2023 by Jesus Bernardino MATSU, MD.  Called Nurse Triage reporting Dizziness.  Symptoms began about a month ago.  Interventions attempted: OTC medications: Ibuprofen .  Symptoms are: unchanged.  Triage Disposition: See PCP When Office is Open (Within 3 Days)  Patient/caregiver understands and will follow disposition?: Yes           Copied from CRM #[id number]. Topic: Clinical - Red Word Triage >> Oct 04, 2023  3:14 PM Deleta RAMAN wrote: Red Word that prompted transfer to Nurse Triage: patient had a fall about 1 month ago still has dizziness and headaches Reason for Disposition  [1] MODERATE dizziness (e.g., interferes with normal activities) AND [2] has been evaluated by doctor (or NP/PA) for this  Answer Assessment - Initial Assessment Questions Pt states states she took her blood pressure today and the reading was 120 over something and states it was normal. She states everything feels light. Currently taking ibuprofen  for symptoms. She states she is able to drive ok and move around but does get dizzy at times. Denies fever and chest pain. Patient states she has been staying hydrated by drinking lots of fluids.     1. DESCRIPTION: Describe your dizziness.     Feels symptoms behind her eyes. Feels symptoms are worsening.  2. LIGHTHEADED: Do you feel lightheaded? (e.g., somewhat faint, woozy, weak upon standing)     Feels symptoms at rest when sitting/ standing 3. VERTIGO: Do you feel like either you or the room is spinning or tilting? (i.e., vertigo)     Spinning  4. SEVERITY: How bad is it?  Do you feel like you are going to faint? Can you stand and walk?     Often symptoms cause her to be unable to walk.  5. ONSET:  When did the dizziness begin?     08/24/23 6. AGGRAVATING FACTORS: Does anything make it worse? (e.g., standing, change in head position)     Moving  around  7. HEART RATE: Can you tell me your heart rate? How many beats in 15 seconds?  (Note: Not all patients can do this.)       Unable to check HR  8. CAUSE: What do you think is causing the dizziness? (e.g., decreased fluids or food, diarrhea, emotional distress, heat exposure, new medicine, sudden standing, vomiting; unknown)     Hit head after getting out of bed to fast went to ED for symptoms.  9. RECURRENT SYMPTOM: Have you had dizziness before? If Yes, ask: When was the last time? What happened that time?     3:15 pm symptoms prompted call  10. OTHER SYMPTOMS: Do you have any other symptoms? (e.g., fever, chest pain, vomiting, diarrhea, bleeding)       Teeth pain and headaches.  Protocols used: Dizziness - Lightheadedness-A-AH

## 2023-10-04 NOTE — Progress Notes (Signed)
 [facility] Counselor/Therapist Progress Note  Patient ID: Jordan [name], MRN: [id number],    Date: 10/04/2023  Time Spent: 4:00pm-4:45pm   45 minutes   Treatment Type: Individual Therapy  Reported Symptoms: stress  Mental Status Exam: Appearance:  Casual     Behavior: Appropriate  Motor: Normal  Speech/Language:  Normal Rate  Affect: Appropriate  Mood: normal  Thought process: normal  Thought content:   WNL  Sensory/Perceptual disturbances:   WNL  Orientation: oriented to person, place, time/date, and situation  Attention: Good  Concentration: Good  Memory: WNL  Fund of knowledge:  Good  Insight:   Good  Judgment:  Good  Impulse Control: Good   Risk Assessment: Danger to Self:  No Self-injurious Behavior: No Danger to Others: No Duty to Warn:no Physical Aggression / Violence:No  Access to Firearms a concern: No  Gang Involvement:No   Subjective: Pt present for face-to-face individual therapy via video.  Pt consents to telehealth video session and is aware of limitations and benefits of virtual sessions.  Location of pt: home Location of therapist: home office.  Pt states she has been having a hard time bc she fell in July.    Pt hit her eye and head on the bed side table.  Pt saw the doctor and they did CT scan of her eye but did not check out her head.   Pt has been having a lot of dizzy spells.  Pt has not been able to do much since her fall.  Pt has also had a lot of pain in her leg since her surgery.   Pt states she has been depressed bc of her pain issues.  Pt's neighbors are also not treating her well which is upsetting to pt.   Pt felt hopeless yesterday and had thoughts about not wanting to be here.   Helped pt process her thoughts and feelings and she contracted to not hurt herself.  Pt states she gets tired of being alone. Pt has not been able to see her boyfriend all summer bc he has to take care of his autistic daughter.   Pt's friend Alm has  been checking in with pt and taking her to church with him.   Addressed how pt can get more involved with activities and people so she won't be so alone and withdrawn.    Provided supportive therapy.     Interventions: Cognitive Behavioral Therapy and Insight-Oriented  Diagnosis:  F33.1  Plan of Care: Recommend ongoing therapy.  Pt participated in setting treatment goals.  She wants to improve coping skills and self esteem.   Plan to meet monthly.  Pt agrees with treatment plan.   Treatment Plan (Treatment Plan Target Date:  06/27/2024) Client Abilities/Strengths  Pt is bright, engaging, and motivated for therapy.   Client Treatment Preferences  Individual therapy.  Client Statement of Needs  Improve coping skills.  Symptoms  Depressed or irritable mood. Low self-esteem. Unresolved grief issues.   Problems Addressed  Unipolar Depression Goals 1. Alleviate depressive symptoms and return to previous level of effective functioning. 2. Appropriately grieve the loss in order to normalize mood and to return to previously adaptive level of functioning. Objective Learn and implement behavioral strategies to overcome depression. Target Date: 2024-06-27 Frequency: Monthly  Progress: 55 Modality: individual  Related Interventions Engage the client in behavioral activation, increasing his/her activity level and contact with sources of reward, while identifying processes that inhibit activation.  Use behavioral techniques such as instruction, rehearsal,  role-playing, role reversal, as needed, to facilitate activity in the client's daily life; reinforce success. Assist the client in developing skills that increase the likelihood of deriving pleasure from behavioral activation (e.g., assertiveness skills, developing an exercise plan, less internal/more external focus, increased social involvement); reinforce success. Objective Identify important people in life, past and present, and describe  the quality, good and poor, of those relationships. Target Date: 2024-06-27 Frequency: Monthly  Progress: 55 Modality: individual  Related Interventions Conduct Interpersonal Therapy beginning with the assessment of the client's interpersonal inventory of important past and present relationships; develop a case formulation linking depression to grief, interpersonal role disputes, role transitions, and/or interpersonal deficits). Objective Learn and implement problem-solving and decision-making skills. Target Date: 2024-06-27 Frequency: Monthly  Progress: 55 Modality: individual  Related Interventions Conduct Problem-Solving Therapy using techniques such as psychoeducation, modeling, and role-playing to teach client problem-solving skills (i.e., defining a problem specifically, generating possible solutions, evaluating the pros and cons of each solution, selecting and implementing a plan of action, evaluating the efficacy of the plan, accepting or revising the plan); role-play application of the problem-solving skill to a real life issue. Encourage in the client the development of a positive problem orientation in which problems and solving them are viewed as a natural part of life and not something to be feared, despaired, or avoided. 3. Develop healthy interpersonal relationships that lead to the alleviation and help prevent the relapse of depression. 4. Develop healthy thinking patterns and beliefs about self, others, and the world that lead to the alleviation and help prevent the relapse of depression. 5. Recognize, accept, and cope with feelings of depression. Diagnosis F33.1  Conditions For Discharge Achievement of treatment goals and objectives   Veva Alma, LCSW

## 2023-10-04 NOTE — Telephone Encounter (Signed)
 FYI Only or Action Required?: FYI only for provider.  Patient was last seen in primary care on 08/31/2023 by Jordan Bernardino MATSU, MD.  Called Nurse Triage reporting Dizziness.  Symptoms began about a month ago.  Interventions attempted: Nothing.  Symptoms are: unchanged.  Triage Disposition: See PCP When Office is Open (Within 3 Days)  Patient/caregiver understands and will follow disposition?: Yes    Copied from CRM #[id number]. Topic: Clinical - Red Word Triage >> Oct 04, 2023  3:14 PM Jordan [name] wrote: Red Word that prompted transfer to Nurse Triage: patient had a fall about 1 month ago still has dizziness and headaches Reason for Disposition  [1] MILD dizziness (e.g., walking normally) AND [2] has NOT been evaluated by doctor (or NP/PA) for this  (Exception: Dizziness caused by heat exposure, sudden standing, or poor fluid intake.)  Answer Assessment - Initial Assessment Questions 1. DESCRIPTION: Describe your dizziness.     Light headed 2. LIGHTHEADED: Do you feel lightheaded? (e.g., somewhat faint, woozy, weak upon standing)     lightheaded 3. VERTIGO: Do you feel like either you or the room is spinning or tilting? (i.e., vertigo)     spinning 4. SEVERITY: How bad is it?  Do you feel like you are going to faint? Can you stand and walk?     Can walk 5. ONSET:  When did the dizziness begin?     Since her fall about a month ago 6. AGGRAVATING FACTORS: Does anything make it worse? (e.g., standing, change in head position)     denies 7. HEART RATE: Can you tell me your heart rate? How many beats in 15 seconds?  (Note: Not all patients can do this.)       denies 8. CAUSE: What do you think is causing the dizziness? (e.g., decreased fluids or food, diarrhea, emotional distress, heat exposure, new medicine, sudden standing, vomiting; unknown)     Had a fall a month ago 9. RECURRENT SYMPTOM: Have you had dizziness before? If Yes, ask: When was the last time?  What happened that time?     Yes, with fall 10. OTHER SYMPTOMS: Do you have any other symptoms? (e.g., fever, chest pain, vomiting, diarrhea, bleeding)       denies 11. PREGNANCY: Is there any chance you are pregnant? When was your last menstrual period?       na  Protocols used: Dizziness - Lightheadedness-A-AH

## 2023-10-08 ENCOUNTER — Ambulatory Visit: Admitting: Physician Assistant

## 2023-10-15 ENCOUNTER — Other Ambulatory Visit: Payer: Self-pay | Admitting: Internal Medicine

## 2023-10-15 DIAGNOSIS — E559 Vitamin D deficiency, unspecified: Secondary | ICD-10-CM

## 2023-10-15 DIAGNOSIS — Z780 Asymptomatic menopausal state: Secondary | ICD-10-CM

## 2023-10-16 DIAGNOSIS — M76891 Other specified enthesopathies of right lower limb, excluding foot: Secondary | ICD-10-CM | POA: Diagnosis not present

## 2023-10-16 DIAGNOSIS — Z9889 Other specified postprocedural states: Secondary | ICD-10-CM | POA: Diagnosis not present

## 2023-10-17 ENCOUNTER — Ambulatory Visit (INDEPENDENT_AMBULATORY_CARE_PROVIDER_SITE_OTHER): Admitting: Internal Medicine

## 2023-10-17 ENCOUNTER — Encounter: Payer: Self-pay | Admitting: Internal Medicine

## 2023-10-17 VITALS — BP 128/74 | HR 82 | Temp 98.2°F | Ht 63.0 in | Wt 129.4 lb

## 2023-10-17 DIAGNOSIS — R202 Paresthesia of skin: Secondary | ICD-10-CM

## 2023-10-17 DIAGNOSIS — G8929 Other chronic pain: Secondary | ICD-10-CM | POA: Diagnosis not present

## 2023-10-17 DIAGNOSIS — R42 Dizziness and giddiness: Secondary | ICD-10-CM

## 2023-10-17 DIAGNOSIS — R200 Anesthesia of skin: Secondary | ICD-10-CM

## 2023-10-17 DIAGNOSIS — R519 Headache, unspecified: Secondary | ICD-10-CM

## 2023-10-17 DIAGNOSIS — M79606 Pain in leg, unspecified: Secondary | ICD-10-CM

## 2023-10-17 DIAGNOSIS — F5101 Primary insomnia: Secondary | ICD-10-CM

## 2023-10-17 DIAGNOSIS — G35 Multiple sclerosis: Secondary | ICD-10-CM | POA: Diagnosis not present

## 2023-10-17 DIAGNOSIS — G43511 Persistent migraine aura without cerebral infarction, intractable, with status migrainosus: Secondary | ICD-10-CM | POA: Diagnosis not present

## 2023-10-17 DIAGNOSIS — F0781 Postconcussional syndrome: Secondary | ICD-10-CM | POA: Diagnosis not present

## 2023-10-17 DIAGNOSIS — R29810 Facial weakness: Secondary | ICD-10-CM

## 2023-10-17 MED ORDER — TRAMADOL HCL 50 MG PO TABS: 50.0000 mg | ORAL_TABLET | Freq: Three times a day (TID) | ORAL | 0 refills | Status: AC | PRN

## 2023-10-17 MED ORDER — TOPIRAMATE 25 MG PO TABS: 25.0000 mg | ORAL_TABLET | Freq: Two times a day (BID) | ORAL | 2 refills | Status: DC

## 2023-10-17 MED ORDER — RIZATRIPTAN BENZOATE 10 MG PO TABS: 10.0000 mg | ORAL_TABLET | ORAL | 0 refills | Status: DC | PRN

## 2023-10-17 MED ORDER — KETOROLAC TROMETHAMINE 60 MG/2ML IM SOLN
60.0000 mg | Freq: Once | INTRAMUSCULAR | Status: AC
  Administered 2023-10-17: 60 mg via INTRAMUSCULAR

## 2023-10-17 MED ORDER — FISH OIL 1000 MG PO CAPS: 2.0000 | ORAL_CAPSULE | Freq: Two times a day (BID) | ORAL | 3 refills | Status: AC

## 2023-10-17 MED ORDER — MECLIZINE HCL 25 MG PO TABS
25.0000 mg | ORAL_TABLET | Freq: Three times a day (TID) | ORAL | 1 refills | Status: DC | PRN
Start: 2023-10-17 — End: 2024-01-15

## 2023-10-17 MED ORDER — B-12 1000 MCG SL SUBL: 1.0000 | SUBLINGUAL_TABLET | Freq: Every day | SUBLINGUAL | 3 refills | Status: AC

## 2023-10-17 NOTE — Progress Notes (Signed)
 ==============================  [facility]: [phone]   -- Medical Office Visit --  Patient: Jordan [name]      Age: 63 y.o.       Sex:  female  Date:   10/17/2023 Today's Healthcare Provider: Bernardino KANDICE Cone, MD  ==============================   Chief Complaint: Dizziness (Very lightheaded and head spinning since July fourth is when she had the fall ) and Fall (Had the fall she states the forehead area she is concerned was never check she stated she wants to make sure everything is good.)  Discussed the use of AI scribe software for clinical note transcription with the patient, who gave verbal consent to proceed. History of Present Illness  63 year old female with multiple sclerosis who presents with worsening dizziness and headaches.  She has been experiencing worsening dizziness and headaches since a fall. The dizziness is frequent and almost constant, while the headaches are diffuse, particularly around the temples, and are associated with migraines. She has been taking ibuprofen  for the headaches and leg pain.  She mentions a history of a fall that resulted in a bruise on her forehead, which has since reduced in size. No dental issues followed the fall, as confirmed by a dentist. She also reports sinus-related discomfort but notes that facial aches have decreased.  She has a history of multiple sclerosis and reports new symptoms of hand and feet tingling. She also notes a new onset of headaches and dizziness. She has not had an MRI previously but expresses concern about potential MS flare-ups.  She is currently taking trazodone  and alprazolam  at night for sleep. She has discussed with her psychiatrist about reducing trazodone  but has not yet made changes. She expresses concern about dependency on alprazolam .  She reports a facial droop on the left side, which she attributes to swelling from the fall. Her smile appears normal, but the droop is  noticeable when her face is relaxed.  She mentions a history of lung surgery and multiple ER visits this year, which have impacted her financially. She is considering a return to work due to boredom and has been approached to run for legislature, which she is contemplating despite being on disability. Dizziness-worsening more frequent.  Headaches around temples now. Tingling in hands. Background Reviewed: Problem List: has Claustrophobia; MULTIPLE SCLEROSIS, PROGRESSIVE/RELAPSING; GLAUCOMA NEC; HAY FEVER; FIBROCYSTIC BREAST DISEASE; New onset of headaches after age 60; Back pain, thoracic; Bilateral dry eyes; Mild cognitive impairment with memory loss; Dyspepsia and disorder of function of stomach; Fibromyalgia; Back pain, lumbosacral; Degeneration of cervical intervertebral disc; Severe recurrent major depression without psychotic features (HCC); Low back pain; Hypertriglyceridemia; Vitamin D  deficiency; Iron deficiency; Hip pain; Abnormal colonoscopy; Thyroid  nodule; Postmenopausal estrogen deficiency; Conductive hearing loss of right ear with unrestricted hearing of left ear; Other parasomnia; Fracture, orbital, open, initial encounter (HCC); Dizziness; and Numbness and tingling in both hands on their problem list. Past Medical History:  has a past medical history of Acute upper respiratory infections of unspecified site, ADD (attention deficit disorder), Allergic rhinitis due to pollen, Allergy, Anal fissure, Anemia, unspecified, Anxiety disorder, Asthma, Carpal tunnel syndrome (04/25/2007), Cataract (2024), Chicken pox, Complication of anesthesia, Cough (07/03/2011), Depression, recurrent (HCC) (12/05/2006), Depressive disorder, not elsewhere classified, Diffuse cystic mastopathy, Falls, Fibromyalgia, GERD (gastroesophageal reflux disease), Headache(784.0), History of anorexia nervosa, History of bulimia, IBS (irritable bowel syndrome), IGT (impaired glucose tolerance) (03/24/2021), Incontinence without  sensory awareness (06/01/2009), 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, Iron deficiency anemia (03/24/2021), Lump or  mass in breast, Meningitis, unspecified(322.9), NASAL POLYP (05/14/2009), NECK PAIN, CHRONIC (02/19/2007), Oral thrush, Other specified glaucoma, Perforation of tympanic membrane, unspecified, Radial styloid tenosynovitis, Seizures (HCC), Stroke (HCC) (2015), and TYMPANIC MEMBRANE PERFORATION (12/05/2006). Past Surgical History:   has a past surgical history that includes Dilation and curettage of uterus (2006); Wrist surgery (2001); Ankle surgery (1992); Inner ear surgery (1966 to 1986); uterine ablation (2011); laparoscopy (1993); Adenoidectomy; Reduction mammaplasty (Bilateral, 2018); Tubal ligation (2010); Abdominal hysterectomy (2010); Eye surgery (2006); Cosmetic surgery (2019); and Breast surgery (2018). Social History:   reports that she has never smoked. She has never used smokeless tobacco. She reports current alcohol use of about 7.0 standard drinks of alcohol per week. She reports that she does not use drugs. Family History:  family history includes ADD / ADHD in her mother, sister, and sister; Alcohol abuse in her maternal aunt, maternal grandfather, maternal uncle, mother, sister, sister, and sister; Anxiety disorder in her maternal aunt, sister, and sister; Arthritis in her father; Breast cancer in her maternal grandmother; Cancer in her mother; Colon polyps (age of onset: 42) in her mother; Coronary artery disease in her mother; Dementia in her mother; Depression in her father, maternal aunt, sister, and sister; Diabetes in her father; Drug abuse in her sister and sister; Hearing loss in her father; Heart disease in her father and mother; Heart failure in her father; Hyperlipidemia in her father; Hypertension in her father and mother; Obesity in her father; Ovarian cancer in her mother; Stroke in her father; Thyroid  disease in her mother; Vision loss in her father. Allergies:  is allergic to  gabapentin , mirtazapine, prednisone , pregabalin, oxycodone, and oxycodone hcl.   Medication Reconciliation: Current Outpatient Medications on File Prior to Visit  Medication Sig   ALPRAZolam  (XANAX ) 0.5 MG tablet Take one tablet by mouth twice daily and an additional 1/2 tablet as needed for severe anxiety.   busPIRone  (BUSPAR ) 30 MG tablet Take 1 tablet (30 mg total) by mouth 2 (two) times daily.   Cholecalciferol (VITAMIN D3) 125 MCG (5000 UT) CAPS TAKE 1 CAPSULE BY MOUTH DAILY   dicyclomine  (BENTYL ) 10 MG capsule TAKE ONE CAPSULE BY MOUTH EVERY 6 HOURS AS NEEDED FOR ABDOMINAL PAIN   DULoxetine  (CYMBALTA ) 60 MG capsule Take 1 capsule (60 mg total) by mouth 2 (two) times daily.   esomeprazole  (NEXIUM ) 40 MG capsule TAKE 1 CAPSULE BY MOUTH TWICE  DAILY BEFORE MEALS   fluticasone  (FLONASE ) 50 MCG/ACT nasal spray SPRAY 1 SPRAY IN EACH NOSTRIL ONCE DAILY AS NEEDED FOR ALLERGIES OR RHINITIS   lamoTRIgine  (LAMICTAL ) 25 MG tablet Take 1 tablet (25 mg total) by mouth 2 (two) times daily.   tiZANidine (ZANAFLEX) 2 MG tablet TAKE 1 TABLET BY MOUTH 3 TIMES A DAY AS NEEDED FOR PAIN/SPASM (Patient not taking: Reported on 08/15/2023)   traZODone  (DESYREL ) 50 MG tablet TAKE 1 TABLET BY MOUTH AT  BEDTIME   No current facility-administered medications on file prior to visit.  There are no discontinued medications.   Physical Exam:    10/17/2023    4:06 PM 08/31/2023    3:41 PM 08/15/2023    1:35 PM  Vitals with BMI  Height 5' 3 5' 3 5' 3  Weight 129 lbs 6 oz 128 lbs 10 oz 128 lbs 6 oz  BMI 22.93 22.79 22.75  Systolic 128 120 879  Diastolic 74 72 62  Pulse 82 83 71  Vital signs reviewed.  Nursing notes reviewed. Weight trend reviewed. Physical Activity: Insufficiently Active (  08/14/2023)   Exercise Vital Sign    Days of Exercise per Week: 5 days    Minutes of Exercise per Session: 20 min   General Appearance:  No acute distress appreciable.   Well-groomed, healthy-appearing female.  Well  proportioned with no abnormal fat distribution.  Good muscle tone. Pulmonary:  Normal work of breathing at rest, no respiratory distress apparent. SpO2: 98 %  Musculoskeletal: All extremities are intact.  Neurological:  Awake, alert, oriented, and engaged.  No obvious focal neurological deficits or cognitive impairments.  Sensorium seems unclouded.   Speech is clear and coherent with logical content. Psychiatric:  Appropriate mood, pleasant and cooperative demeanor, thoughtful and engaged during the exam   Verbalized to patient: Physical Exam NEUROLOGICAL: Left facial droop present.   Results:    08/31/2023    3:48 PM 08/31/2023    3:47 PM 04/16/2023    1:57 PM 03/20/2023    1:15 PM  PHQ 2/9 Scores  PHQ - 2 Score 0 0 0 0  PHQ- 9 Score 0   0    Verbalized to patient: Results     No results found for any visits on 10/17/23. Office Visit on 08/15/2023  Component Date Value Ref Range Status   WBC 08/15/2023 5.7  4.0 - 10.5 K/uL Final   RBC 08/15/2023 4.36  3.87 - 5.11 Mil/uL Final   Hemoglobin 08/15/2023 14.3  12.0 - 15.0 g/dL Final   HCT 93/74/7974 41.9  36.0 - 46.0 % Final   MCV 08/15/2023 96.2  78.0 - 100.0 fl Final   MCHC 08/15/2023 34.0  30.0 - 36.0 g/dL Final   RDW 93/74/7974 13.2  11.5 - 15.5 % Final   Platelets 08/15/2023 190.0  150.0 - 400.0 K/uL Final   Neutrophils Relative % 08/15/2023 61.2  43.0 - 77.0 % Final   Lymphocytes Relative 08/15/2023 27.7  12.0 - 46.0 % Final   Monocytes Relative 08/15/2023 7.5  3.0 - 12.0 % Final   Eosinophils Relative 08/15/2023 2.5  0.0 - 5.0 % Final   Basophils Relative 08/15/2023 1.1  0.0 - 3.0 % Final   Neutro Abs 08/15/2023 3.5  1.4 - 7.7 K/uL Final   Lymphs Abs 08/15/2023 1.6  0.7 - 4.0 K/uL Final   Monocytes Absolute 08/15/2023 0.4  0.1 - 1.0 K/uL Final   Eosinophils Absolute 08/15/2023 0.1  0.0 - 0.7 K/uL Final   Basophils Absolute 08/15/2023 0.1  0.0 - 0.1 K/uL Final  Office Visit on 04/16/2023  Component Date Value Ref  Range Status   Cholesterol 04/16/2023 181  0 - 200 mg/dL Final   Triglycerides 97/75/7974 124.0  0.0 - 149.0 mg/dL Final   HDL 97/75/7974 60.90  >39.00 mg/dL Final   VLDL 97/75/7974 24.8  0.0 - 40.0 mg/dL Final   LDL Cholesterol 04/16/2023 95  0 - 99 mg/dL Final   Total CHOL/HDL Ratio 04/16/2023 3   Final   NonHDL 04/16/2023 120.25   Final   Sodium 04/16/2023 141  135 - 145 mEq/L Final   Potassium 04/16/2023 4.2  3.5 - 5.1 mEq/L Final   Chloride 04/16/2023 106  96 - 112 mEq/L Final   CO2 04/16/2023 27  19 - 32 mEq/L Final   Glucose, Bld 04/16/2023 84  70 - 99 mg/dL Final   BUN 97/75/7974 18  6 - 23 mg/dL Final   Creatinine, Ser 04/16/2023 0.94  0.40 - 1.20 mg/dL Final   Total Bilirubin 04/16/2023 0.4  0.2 - 1.2 mg/dL Final   Alkaline  Phosphatase 04/16/2023 59  39 - 117 U/L Final   AST 04/16/2023 18  0 - 37 U/L Final   ALT 04/16/2023 14  0 - 35 U/L Final   Total Protein 04/16/2023 6.7  6.0 - 8.3 g/dL Final   Albumin 97/75/7974 4.4  3.5 - 5.2 g/dL Final   GFR 97/75/7974 64.94  >60.00 mL/min Final   Calcium 04/16/2023 9.2  8.4 - 10.5 mg/dL Final   WBC 97/75/7974 6.2  4.0 - 10.5 K/uL Final   RBC 04/16/2023 4.39  3.87 - 5.11 Mil/uL Final   Hemoglobin 04/16/2023 14.3  12.0 - 15.0 g/dL Final   HCT 97/75/7974 42.7  36.0 - 46.0 % Final   MCV 04/16/2023 97.3  78.0 - 100.0 fl Final   MCHC 04/16/2023 33.5  30.0 - 36.0 g/dL Final   RDW 97/75/7974 13.1  11.5 - 15.5 % Final   Platelets 04/16/2023 190.0  150.0 - 400.0 K/uL Final   Neutrophils Relative % 04/16/2023 66.0  43.0 - 77.0 % Final   Lymphocytes Relative 04/16/2023 23.3  12.0 - 46.0 % Final   Monocytes Relative 04/16/2023 6.7  3.0 - 12.0 % Final   Eosinophils Relative 04/16/2023 2.8  0.0 - 5.0 % Final   Basophils Relative 04/16/2023 1.2  0.0 - 3.0 % Final   Neutro Abs 04/16/2023 4.1  1.4 - 7.7 K/uL Final   Lymphs Abs 04/16/2023 1.4  0.7 - 4.0 K/uL Final   Monocytes Absolute 04/16/2023 0.4  0.1 - 1.0 K/uL Final   Eosinophils Absolute  04/16/2023 0.2  0.0 - 0.7 K/uL Final   Basophils Absolute 04/16/2023 0.1  0.0 - 0.1 K/uL Final   VITD 04/16/2023 59.67  30.00 - 100.00 ng/mL Final   T3 Uptake 04/16/2023 31  22 - 35 % Final   T4, Total 04/16/2023 5.6  5.1 - 11.9 mcg/dL Final   Free Thyroxine Index 04/16/2023 1.7  1.4 - 3.8 Final   TSH 04/16/2023 1.78  0.40 - 4.50 mIU/L Final   Ferritin 04/16/2023 28.2  10.0 - 291.0 ng/mL Final  Appointment on 12/20/2022  Component Date Value Ref Range Status   Iron 12/20/2022 69  42 - 145 ug/dL Final   Transferrin 89/69/7975 274.0  212.0 - 360.0 mg/dL Final   Saturation Ratios 12/20/2022 18.0 (L)  20.0 - 50.0 % Final   Ferritin 12/20/2022 22.1  10.0 - 291.0 ng/mL Final   TIBC 12/20/2022 383.6  250.0 - 450.0 mcg/dL Final   WBC 89/69/7975 8.9  4.0 - 10.5 K/uL Final   RBC 12/20/2022 4.32  3.87 - 5.11 Mil/uL Final   Hemoglobin 12/20/2022 14.2  12.0 - 15.0 g/dL Final   HCT 89/69/7975 41.9  36.0 - 46.0 % Final   MCV 12/20/2022 96.9  78.0 - 100.0 fl Final   MCHC 12/20/2022 34.0  30.0 - 36.0 g/dL Final   RDW 89/69/7975 12.9  11.5 - 15.5 % Final   Platelets 12/20/2022 216.0  150.0 - 400.0 K/uL Final   Neutrophils Relative % 12/20/2022 70.4  43.0 - 77.0 % Final   Lymphocytes Relative 12/20/2022 18.3  12.0 - 46.0 % Final   Monocytes Relative 12/20/2022 7.3  3.0 - 12.0 % Final   Eosinophils Relative 12/20/2022 3.1  0.0 - 5.0 % Final   Basophils Relative 12/20/2022 0.9  0.0 - 3.0 % Final   Neutro Abs 12/20/2022 6.3  1.4 - 7.7 K/uL Final   Lymphs Abs 12/20/2022 1.6  0.7 - 4.0 K/uL Final   Monocytes Absolute 12/20/2022 0.7  0.1 - 1.0 K/uL Final   Eosinophils Absolute 12/20/2022 0.3  0.0 - 0.7 K/uL Final   Basophils Absolute 12/20/2022 0.1  0.0 - 0.1 K/uL Final  Scanned Document on 11/28/2022  Component Date Value Ref Range Status   HM Pap smear 12/26/2019 NILM   Final   HPV, high-risk 12/26/2019 Negative   Final  Appointment on 08/18/2022  Component Date Value Ref Range Status   BUN  08/18/2022 18  6 - 23 mg/dL Final   Hemoglobin 93/71/7975 14.4  12.0 - 15.0 g/dL Final  Appointment on 93/95/7975  Component Date Value Ref Range Status   WBC 07/25/2022 6.9  4.0 - 10.5 K/uL Final   RBC 07/25/2022 4.47  3.87 - 5.11 Mil/uL Final   Hemoglobin 07/25/2022 14.4  12.0 - 15.0 g/dL Final   HCT 93/95/7975 42.9  36.0 - 46.0 % Final   MCV 07/25/2022 95.9  78.0 - 100.0 fl Final   MCHC 07/25/2022 33.7  30.0 - 36.0 g/dL Final   RDW 93/95/7975 14.0  11.5 - 15.5 % Final   Platelets 07/25/2022 203.0  150.0 - 400.0 K/uL Final   Neutrophils Relative % 07/25/2022 64.7  43.0 - 77.0 % Final   Lymphocytes Relative 07/25/2022 22.4  12.0 - 46.0 % Final   Monocytes Relative 07/25/2022 9.3  3.0 - 12.0 % Final   Eosinophils Relative 07/25/2022 2.8  0.0 - 5.0 % Final   Basophils Relative 07/25/2022 0.8  0.0 - 3.0 % Final   Neutro Abs 07/25/2022 4.5  1.4 - 7.7 K/uL Final   Lymphs Abs 07/25/2022 1.5  0.7 - 4.0 K/uL Final   Monocytes Absolute 07/25/2022 0.6  0.1 - 1.0 K/uL Final   Eosinophils Absolute 07/25/2022 0.2  0.0 - 0.7 K/uL Final   Basophils Absolute 07/25/2022 0.1  0.0 - 0.1 K/uL Final   Iron 07/25/2022 113  42 - 145 ug/dL Final   Transferrin 93/95/7975 320.0  212.0 - 360.0 mg/dL Final   Saturation Ratios 07/25/2022 25.2  20.0 - 50.0 % Final   Ferritin 07/25/2022 7.6 (L)  10.0 - 291.0 ng/mL Final   TIBC 07/25/2022 448.0  250.0 - 450.0 mcg/dL Final  Appointment on 95/75/7975  Component Date Value Ref Range Status   VITD 06/14/2022 44.56  30.00 - 100.00 ng/mL Final  Office Visit on 04/13/2022  Component Date Value Ref Range Status   Hgb A1c MFr Bld 04/13/2022 5.3  4.6 - 6.5 % Final   VITD 04/13/2022 22.07 (L)  30.00 - 100.00 ng/mL Final   Vitamin B-12 04/13/2022 611  211 - 911 pg/mL Final   TSH 04/13/2022 1.28  0.35 - 5.50 uIU/mL Final   Cholesterol 04/13/2022 200  0 - 200 mg/dL Final   Triglycerides 97/77/7975 191.0 (H)  0.0 - 149.0 mg/dL Final   HDL 97/77/7975 55.20  >39.00 mg/dL  Final   VLDL 97/77/7975 38.2  0.0 - 40.0 mg/dL Final   LDL Cholesterol 04/13/2022 106 (H)  0 - 99 mg/dL Final   Total CHOL/HDL Ratio 04/13/2022 4   Final   NonHDL 04/13/2022 144.36   Final   Sodium 04/13/2022 142  135 - 145 mEq/L Final   Potassium 04/13/2022 3.8  3.5 - 5.1 mEq/L Final   Chloride 04/13/2022 107  96 - 112 mEq/L Final   CO2 04/13/2022 25  19 - 32 mEq/L Final   Glucose, Bld 04/13/2022 96  70 - 99 mg/dL Final   BUN 97/77/7975 19  6 - 23 mg/dL Final   Creatinine, Ser  04/13/2022 1.00  0.40 - 1.20 mg/dL Final   Total Bilirubin 04/13/2022 0.6  0.2 - 1.2 mg/dL Final   Alkaline Phosphatase 04/13/2022 70  39 - 117 U/L Final   AST 04/13/2022 19  0 - 37 U/L Final   ALT 04/13/2022 14  0 - 35 U/L Final   Total Protein 04/13/2022 6.6  6.0 - 8.3 g/dL Final   Albumin 97/77/7975 4.6  3.5 - 5.2 g/dL Final   GFR 97/77/7975 60.72  >60.00 mL/min Final   Calcium 04/13/2022 9.4  8.4 - 10.5 mg/dL Final   WBC 97/77/7975 5.8  4.0 - 10.5 K/uL Final   RBC 04/13/2022 4.32  3.87 - 5.11 Mil/uL Final   Hemoglobin 04/13/2022 14.1  12.0 - 15.0 g/dL Final   HCT 97/77/7975 40.8  36.0 - 46.0 % Final   MCV 04/13/2022 94.4  78.0 - 100.0 fl Final   MCHC 04/13/2022 34.6  30.0 - 36.0 g/dL Final   RDW 97/77/7975 12.6  11.5 - 15.5 % Final   Platelets 04/13/2022 204.0  150.0 - 400.0 K/uL Final   Neutrophils Relative % 04/13/2022 58.7  43.0 - 77.0 % Final   Lymphocytes Relative 04/13/2022 28.3  12.0 - 46.0 % Final   Monocytes Relative 04/13/2022 8.8  3.0 - 12.0 % Final   Eosinophils Relative 04/13/2022 3.2  0.0 - 5.0 % Final   Basophils Relative 04/13/2022 1.0  0.0 - 3.0 % Final   Neutro Abs 04/13/2022 3.4  1.4 - 7.7 K/uL Final   Lymphs Abs 04/13/2022 1.6  0.7 - 4.0 K/uL Final   Monocytes Absolute 04/13/2022 0.5  0.1 - 1.0 K/uL Final   Eosinophils Absolute 04/13/2022 0.2  0.0 - 0.7 K/uL Final   Basophils Absolute 04/13/2022 0.1  0.0 - 0.1 K/uL Final   Iron 04/13/2022 99  42 - 145 ug/dL Final   Transferrin  97/77/7975 319.0  212.0 - 360.0 mg/dL Final   Saturation Ratios 04/13/2022 22.2  20.0 - 50.0 % Final   Ferritin 04/13/2022 22.3  10.0 - 291.0 ng/mL Final   TIBC 04/13/2022 446.6  250.0 - 450.0 mcg/dL Final  Appointment on 97/80/7975  Component Date Value Ref Range Status   Iron 04/10/2022 153 (H)  42 - 145 ug/dL Final   Transferrin 97/80/7975 315.0  212.0 - 360.0 mg/dL Final   Saturation Ratios 04/10/2022 34.7  20.0 - 50.0 % Final   Ferritin 04/10/2022 21.8  10.0 - 291.0 ng/mL Final   TIBC 04/10/2022 441.0  250.0 - 450.0 mcg/dL Final   WBC 97/80/7975 8.1  4.0 - 10.5 K/uL Final   RBC 04/10/2022 4.46  3.87 - 5.11 Mil/uL Final   Hemoglobin 04/10/2022 14.6  12.0 - 15.0 g/dL Final   HCT 97/80/7975 42.7  36.0 - 46.0 % Final   MCV 04/10/2022 95.9  78.0 - 100.0 fl Final   MCHC 04/10/2022 34.1  30.0 - 36.0 g/dL Final   RDW 97/80/7975 13.1  11.5 - 15.5 % Final   Platelets 04/10/2022 228.0  150.0 - 400.0 K/uL Final   Neutrophils Relative % 04/10/2022 67.1  43.0 - 77.0 % Final   Lymphocytes Relative 04/10/2022 22.0  12.0 - 46.0 % Final   Monocytes Relative 04/10/2022 8.1  3.0 - 12.0 % Final   Eosinophils Relative 04/10/2022 2.1  0.0 - 5.0 % Final   Basophils Relative 04/10/2022 0.7  0.0 - 3.0 % Final   Neutro Abs 04/10/2022 5.4  1.4 - 7.7 K/uL Final   Lymphs Abs 04/10/2022 1.8  0.7 -  4.0 K/uL Final   Monocytes Absolute 04/10/2022 0.7  0.1 - 1.0 K/uL Final   Eosinophils Absolute 04/10/2022 0.2  0.0 - 0.7 K/uL Final   Basophils Absolute 04/10/2022 0.1  0.0 - 0.1 K/uL Final  Appointment on 01/24/2022  Component Date Value Ref Range Status   Iron 01/24/2022 97  42 - 145 ug/dL Final   Transferrin 87/94/7976 286.0  212.0 - 360.0 mg/dL Final   Saturation Ratios 01/24/2022 24.2  20.0 - 50.0 % Final   Ferritin 01/24/2022 14.4  10.0 - 291.0 ng/mL Final   TIBC 01/24/2022 400.4  250.0 - 450.0 mcg/dL Final   WBC 87/94/7976 8.1  4.0 - 10.5 K/uL Final   RBC 01/24/2022 4.39  3.87 - 5.11 Mil/uL Final    Hemoglobin 01/24/2022 14.5  12.0 - 15.0 g/dL Final   HCT 87/94/7976 42.0  36.0 - 46.0 % Final   MCV 01/24/2022 95.6  78.0 - 100.0 fl Final   MCHC 01/24/2022 34.6  30.0 - 36.0 g/dL Final   RDW 87/94/7976 13.7  11.5 - 15.5 % Final   Platelets 01/24/2022 192.0  150.0 - 400.0 K/uL Final   Neutrophils Relative % 01/24/2022 66.4  43.0 - 77.0 % Final   Lymphocytes Relative 01/24/2022 22.8  12.0 - 46.0 % Final   Monocytes Relative 01/24/2022 7.1  3.0 - 12.0 % Final   Eosinophils Relative 01/24/2022 3.1  0.0 - 5.0 % Final   Basophils Relative 01/24/2022 0.6  0.0 - 3.0 % Final   Neutro Abs 01/24/2022 5.4  1.4 - 7.7 K/uL Final   Lymphs Abs 01/24/2022 1.9  0.7 - 4.0 K/uL Final   Monocytes Absolute 01/24/2022 0.6  0.1 - 1.0 K/uL Final   Eosinophils Absolute 01/24/2022 0.2  0.0 - 0.7 K/uL Final   Basophils Absolute 01/24/2022 0.0  0.0 - 0.1 K/uL Final  There may be more visits with results that are not included.  No image results found. CT ORBITS WO CONTRAST Result Date: 09/03/2023 CLINICAL DATA:  Orbital trauma EXAM: CT ORBITS WITHOUT CONTRAST TECHNIQUE: Multidetector CT imaging of the orbits was performed using the standard protocol without intravenous contrast. Multiplanar CT image reconstructions were also generated. RADIATION DOSE REDUCTION: This exam was performed according to the departmental dose-optimization program which includes automated exposure control, adjustment of the mA and/or kV according to patient size and/or use of iterative reconstruction technique. COMPARISON:  CT temporal bone 06/29/2023 FINDINGS: Orbits: No fracture or orbital emphysema. Globes are intact. Intra-ocular muscles and optic nerves unremarkable. Visible paranasal sinuses: Mucosal thickening in the right maxillary sinus. No air-fluid levels. Mastoid air cells clear. Soft tissues: Negative Osseous: No fracture or aggressive lesion. Limited intracranial: No acute or significant finding. IMPRESSION: No orbital fracture or  abnormality noted. Chronic right maxillary sinusitis. Electronically Signed   By: Franky Crease M.D.   On: 09/03/2023 13:36         ASSESSMENT & PLAN   Assessment & Plan Post concussion syndrome New onset of headaches after age 72 Intractable persistent migraine aura without cerebral infarction and with status migrainosus Multiple sclerosis (HCC) Dizziness Post-concussion syndrome with headache, dizziness, and migraine features   Persistent post-concussion syndrome presents with worsening dizziness and new onset headaches, likely migraines. Symptoms may persist for years, with dizziness and headaches related to post-concussive migraines. Order an MRI with and without contrast to assess post-injury changes and rule out other causes. Prescribe rizatriptan  for migraine headaches, starting with half a tablet due to potential side effects. Administer a Toradol  injection  for acute headache pain. Consider topiramate  to reduce headache and dizziness frequency. If tolerated, consider gabapentin  for headache and dizziness reduction. Recommend meclizine  for dizziness if migraine medications are ineffective.  Multiple sclerosis with new paresthesia of hands and feet   New onset paresthesia in hands and feet may relate to multiple sclerosis, with differential including peripheral nerve issues or B12 deficiency. Recommend B12 supplementation and prescribe omega-3 fatty acids to support nerve health. Refer to a neurologist for further evaluation and management of multiple sclerosis. Numbness and tingling in both hands Cause uncertain but she feels its not recurrence of carpal tunnel syndrome but its just in hands Mouth droop due to facial weakness Left facial droop   New left facial droop likely results from swelling due to a previous injury rather than true weakness. It does not affect her smile, suggesting it is not due to nerve damage. Primary insomnia Insomnia related to medication use   Trazodone  is  not dependency forming, while alprazolam  is highly dependency forming, requiring a gradual taper if reduction is attempted. Consider tapering trazodone  by reducing to half a tablet as a future option. Discussed potential challenges with tapering alprazolam  and recommended a gradual reduction if attempted. Chronic pain of lower extremity, unspecified laterality Chronic lower extremity pain is managed with ibuprofen .  Goals of Care   Discussed potential future candidacy for legislature and the impact of multiple sclerosis on her ability to serve. 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    ORDER ASSOCIATIONS  #   DIAGNOSIS / CONDITION ICD-10 ENCOUNTER ORDER     ICD-10-CM   1. Post concussion syndrome  F07.81     2. New onset of headaches after age 11  R51.9 rizatriptan  (MAXALT ) 10 MG tablet    topiramate  (TOPAMAX ) 25 MG tablet    ketorolac  (TORADOL ) injection 60 mg    traMADol  (ULTRAM ) 50 MG tablet    3. Numbness and tingling in both hands  R20.0 Cyanocobalamin  (B-12) 1000 MCG SUBL   R20.2     4. Dizziness  R42 meclizine  (ANTIVERT ) 25 MG tablet    5. Multiple sclerosis (HCC)  G35 Cyanocobalamin  (B-12) 1000 MCG SUBL    Omega-3 Fatty Acids (FISH OIL ) 1000 MG CAPS    6. Mouth droop due to facial weakness  R29.810     7. Primary insomnia  F51.01     8. Chronic pain of lower extremity, unspecified laterality  M79.606    G89.29     9. Intractable persistent migraine aura without cerebral infarction and with status migrainosus  G43.511          Orders Placed in Encounter:   Meds ordered this encounter  Medications   rizatriptan  (MAXALT ) 10 MG tablet    Sig: Take 1 tablet (10 mg total) by mouth as needed for migraine. May repeat in 2 hours if needed    Dispense:  10 tablet    Refill:  0   topiramate  (TOPAMAX ) 25 MG tablet    Sig: Take 1 tablet (25 mg total) by mouth 2 (two) times daily.    Dispense:  60 tablet     Refill:  2   Cyanocobalamin  (B-12) 1000 MCG SUBL    Sig: Place 1 tablet under the tongue daily at 6 (six) AM.    Dispense:  90 tablet    Refill:  3   Omega-3 Fatty Acids (FISH OIL ) 1000 MG CAPS    Sig: Take 2 capsules (  2,000 mg total) by mouth 2 (two) times daily before lunch and supper.    Dispense:  360 capsule    Refill:  3   meclizine  (ANTIVERT ) 25 MG tablet    Sig: Take 1 tablet (25 mg total) by mouth 3 (three) times daily as needed for dizziness.    Dispense:  30 tablet    Refill:  1   ketorolac  (TORADOL ) injection 60 mg   traMADol  (ULTRAM ) 50 MG tablet    Sig: Take 1 tablet (50 mg total) by mouth every 8 (eight) hours as needed for up to 5 days.    Dispense:  15 tablet    Refill:  0      This document was synthesized by artificial intelligence (Abridge) using HIPAA-compliant recording of the clinical interaction;   We discussed the use of AI scribe software for clinical note transcription with the patient, who gave verbal consent to proceed. additional Info: This encounter employed state-of-the-art, real-time, collaborative documentation. The patient actively reviewed and assisted in updating their electronic medical record on a shared screen, ensuring transparency and facilitating joint problem-solving for the problem list, overview, and plan. This approach promotes accurate, informed care. The treatment plan was discussed and reviewed in detail, including medication safety, potential side effects, and all patient questions. We confirmed understanding and comfort with the plan. Follow-up instructions were established, including contacting the office for any concerns, returning if symptoms worsen, persist, or new symptoms develop, and precautions for potential emergency department visits.

## 2023-10-18 ENCOUNTER — Encounter: Payer: Self-pay | Admitting: Internal Medicine

## 2023-10-18 NOTE — Patient Instructions (Signed)
 It was a pleasure seeing you today! Your health and satisfaction are our top priorities.  Bernardino Cone, MD  VISIT SUMMARY: Today, we discussed your worsening dizziness and headaches, which have been persistent since your fall. We also reviewed your new symptoms of hand and feet tingling, your concerns about medication dependency, and your chronic lower extremity pain. 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  YOUR PLAN: -POST-CONCUSSION SYNDROME WITH HEADACHE, DIZZINESS, AND MIGRAINE FEATURES: Post-concussion syndrome can cause symptoms like dizziness and headaches, which may persist for a long time. We will order an MRI to check for any changes since your injury and rule out other causes. You will start taking rizatriptan  for migraines, beginning with half a tablet to minimize side effects. You received a Toradol  injection today for acute headache pain. We may consider topiramate  or gabapentin  if your symptoms continue, and meclizine  if dizziness persists.  -MULTIPLE SCLEROSIS WITH NEW PARESTHESIA OF HANDS AND FEET: Multiple sclerosis can cause new tingling sensations in your hands and feet. We will start you on B12 supplements and omega-3 fatty acids to support nerve health. You will also be referred to a neurologist for further evaluation and management.  -LEFT FACIAL DROOP: Your left facial droop is likely due to swelling from your previous injury and not nerve damage, as it does not affect your smile.  -INSOMNIA RELATED TO MEDICATION USE: Trazodone  is not habit-forming, but alprazolam  can be. If you decide to reduce your medication, we recommend a gradual taper. You might consider reducing trazodone  to half a tablet in the future. We discussed the challenges of tapering alprazolam  and recommend a slow reduction if you choose to do so.  -CHRONIC LOWER EXTREMITY PAIN: Your chronic lower extremity pain is  currently managed with ibuprofen .  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 You are considering this opportunity despite your disability status, as it is a part-time position.  INSTRUCTIONS: Please schedule an MRI with and without contrast to assess post-injury changes. Follow up with a neurologist for further evaluation of your multiple sclerosis. If you decide to taper your medications, do so gradually and consult with your psychiatrist. Continue taking B12 supplements and omega-3 fatty acids as recommended.  Your Providers PCP: Cone Bernardino MATSU, MD,  [phone]) Referring Provider: Cone Bernardino MATSU, MD,  [phone]) Care Team Provider: Vincente Grip, MD,  [phone]) Care Team Provider: Maurice Lynwood HERO, MD,  [phone]) Care Team Provider: Erline Olam Plana, MD,  [phone]) Care Team Provider: Lonni Slain, MD,  [phone]) Care Team Provider: Leigh Elspeth SQUIBB, MD,  [phone])  NEXT STEPS: [x]  Early Intervention: Schedule sooner appointment, call our on-call services, or go to emergency room if there is any significant Increase in pain or discomfort New or worsening symptoms Sudden or severe changes in your health [x]  Flexible Follow-Up: We recommend a No follow-ups on file. for optimal routine care. This allows for progress monitoring and treatment adjustments. [x]  Preventive Care: Schedule your annual preventive care visit! It's typically covered by insurance and helps identify potential health issues early. [x]  Lab & X-ray Appointments: Incomplete tests scheduled today, or call to schedule. X-rays: [facility] (M-F, 8:30am-noon or 1pm-5pm). [x]  Medical Information Release: Sign a release form at front desk to obtain relevant medical information we don't have.  MAKING THE MOST OF OUR FOCUSED 20 MINUTE APPOINTMENTS: [x]   Clearly state  your top concerns at the beginning of the visit to focus our discussion [x]   If you anticipate you will need more time, please inform the front desk during scheduling - we can book multiple appointments in the same week. [x]   If you have transportation problems- use our convenient video appointments or ask about transportation support. [x]   We can get down to business faster if you use MyChart to update information before the visit and submit non-urgent questions before your visit. Thank you for taking the time to provide details through MyChart.  Let our nurse know and she can import this information into your encounter documents.  Arrival and Wait Times: [x]   Arriving on time ensures that everyone receives prompt attention. [x]   Early morning (8a) and afternoon (1p) appointments tend to have shortest wait times. [x]   Unfortunately, we cannot delay appointments for late arrivals or hold slots during phone calls.  Getting Answers and Following Up [x]   Simple Questions & Concerns: For quick questions or basic follow-up after your visit, reach us  at [phone] or MyChart messaging. [x]   Complex Concerns: If your concern is more complex, scheduling an appointment might be best. Discuss this with the staff to find the most suitable option. [x]   Lab & Imaging Results: We'll contact you directly if results are abnormal or you don't use MyChart. Most normal results will be on MyChart within 2-3 business days, with a review message from Dr. Jesus. Haven't heard back in 2 weeks? Need results sooner? Contact us  at [phone]. [x]   Referrals: Our referral coordinator will manage specialist referrals. The specialist's office should contact you within 2 weeks to schedule an appointment. Call us  if you haven't heard from them after 2 weeks.  Staying Connected [x]   MyChart: Activate your MyChart for the fastest way to access results and message us . See the last page of this paperwork for instructions on  how to activate.  Bring to Your Next Appointment [x]   Medications: Please bring all your medication bottles to your next appointment to ensure we have an accurate record of your prescriptions. [x]   Health Diaries: If you're monitoring any health conditions at home, keeping a diary of your readings can be very helpful for discussions at your next appointment.  Billing [x]   X-ray & Lab Orders: These are billed by separate companies. Contact the invoicing company directly for questions or concerns. [x]   Visit Charges: Discuss any billing inquiries with our administrative services team.  Your Satisfaction Matters [x]   Share Your Experience: We strive for your satisfaction! If you have any complaints, or preferably compliments, please let Dr. Jesus know directly or contact our Practice Administrators, Manuelita Rubin or [employer], by asking at the front desk.   Reviewing Your Records [x]   Review this early draft of your clinical encounter notes below and the final encounter summary tomorrow on MyChart after its been completed.  All orders placed so far are visible here: Post concussion syndrome  New onset of headaches after age 31 -     Rizatriptan  Benzoate; Take 1 tablet (10 mg total) by mouth as needed for migraine. May repeat in 2 hours if needed  Dispense: 10 tablet; Refill: 0 -     Topiramate ; Take 1 tablet (25 mg total) by mouth 2 (two) times daily.  Dispense: 60 tablet; Refill: 2 -     Ketorolac  Tromethamine  -     traMADol  HCl; Take 1 tablet (50 mg total) by mouth every 8 (eight) hours as needed  for up to 5 days.  Dispense: 15 tablet; Refill: 0  Numbness and tingling in both hands -     B-12; Place 1 tablet under the tongue daily at 6 (six) AM.  Dispense: 90 tablet; Refill: 3  Dizziness -     Meclizine  HCl; Take 1 tablet (25 mg total) by mouth 3 (three) times daily as needed for dizziness.  Dispense: 30 tablet; Refill: 1  Multiple sclerosis (HCC) -     B-12; Place 1 tablet under  the tongue daily at 6 (six) AM.  Dispense: 90 tablet; Refill: 3 -     Fish Oil ; Take 2 capsules (2,000 mg total) by mouth 2 (two) times daily before lunch and supper.  Dispense: 360 capsule; Refill: 3  Mouth droop due to facial weakness  Primary insomnia  Chronic pain of lower extremity, unspecified laterality  Intractable persistent migraine aura without cerebral infarction and with status migrainosus

## 2023-10-18 NOTE — Assessment & Plan Note (Signed)
 Cause uncertain but she feels its not recurrence of carpal tunnel syndrome but its just in hands

## 2023-10-18 NOTE — Assessment & Plan Note (Signed)
 Post-concussion syndrome with headache, dizziness, and migraine features   Persistent post-concussion syndrome presents with worsening dizziness and new onset headaches, likely migraines. Symptoms may persist for years, with dizziness and headaches related to post-concussive migraines. Order an MRI with and without contrast to assess post-injury changes and rule out other causes. Prescribe rizatriptan  for migraine headaches, starting with half a tablet due to potential side effects. Administer a Toradol  injection for acute headache pain. Consider topiramate  to reduce headache and dizziness frequency. If tolerated, consider gabapentin  for headache and dizziness reduction. Recommend meclizine  for dizziness if migraine medications are ineffective.  Multiple sclerosis with new paresthesia of hands and feet   New onset paresthesia in hands and feet may relate to multiple sclerosis, with differential including peripheral nerve issues or B12 deficiency. Recommend B12 supplementation and prescribe omega-3 fatty acids to support nerve health. Refer to a neurologist for further evaluation and management of multiple sclerosis.

## 2023-10-24 ENCOUNTER — Ambulatory Visit: Admitting: Psychology

## 2023-10-29 ENCOUNTER — Other Ambulatory Visit: Payer: Self-pay | Admitting: Internal Medicine

## 2023-10-29 ENCOUNTER — Telehealth: Payer: Self-pay | Admitting: Internal Medicine

## 2023-10-29 DIAGNOSIS — G43811 Other migraine, intractable, with status migrainosus: Secondary | ICD-10-CM

## 2023-10-29 DIAGNOSIS — R519 Headache, unspecified: Secondary | ICD-10-CM

## 2023-10-29 MED ORDER — RIZATRIPTAN BENZOATE 10 MG PO TBDP: 10.0000 mg | ORAL_TABLET | ORAL | 11 refills | Status: DC | PRN

## 2023-10-29 NOTE — Telephone Encounter (Signed)
 Advised to see provider within 3 days  Patient Name First: Jordan Last: [name] Gender: Female DOB: 1960-06-08 Age: 63 Y 2 M 28 D Return Phone Number: [phone] (Primary) Address: City/ State/ Zip: [place]  [address] Client [facility] Human [facility] [employer] Provider Jesus Motto- MD Contact Type Call Who Is Calling Patient / Member / Family / Caregiver Call Type Triage / Clinical Relationship To Patient Self Return Phone Number [phone] (Primary) Chief Complaint Headache Reason for Call Symptomatic / Request for Health Information Initial Comment Caller states that she had a head concussion 3 weeks ago. Currently out of migraine medication - does not have enough medication to last until the office reopens. c/o a migraine and dizziness. Translation No Nurse Assessment Nurse: Kirt, RN, Mabelene Date/Time (Eastern Time): 10/27/2023 5:50:42 PM Confirm and document reason for call. If symptomatic, describe symptoms. ---Caller states has been 2 months since she had a concussion and she has had a prescription for medication for migraines. She is out as of today. She took one today but does not have anymore. Directions are to take 1 now and every 2 hours as needed. No more refills on it. Rizatriptan  10 mg. Severe headache and dizziness today. No fever Does the patient have any new or worsening symptoms? ---Yes Will a triage be completed? ---Yes Related visit to physician within the last 2 weeks? ---Yes Does the PT have any chronic conditions? (i.e. diabetes, asthma, this includes High risk factors for pregnancy, etc.) ---Yes List chronic conditions. ---MS Is this a behavioral health or substance abuse call? ---No Nurse: Kirt, RN, Mabelene Date/Time (Eastern Time): 10/27/2023 6:02:53 PM You have opened med assessment, do you wish to continue? ---Yes Please select the assessment type  ---Refill Nurse Assessment Additional Documentation ---Caller states has been 2 months since she had a concussion and she has had a prescription for medication for migraines. She is out as of today. She took one today but does not have anymore. Directions are to take 1 now and every 2 hours as needed. No more refills on it. Rizatriptan  10 mg. Severe headache and dizziness today. No fever Does the patient have enough medication to last until the office opens? ---No Guidelines Guideline Title Affirmed Question Affirmed Notes Nurse Date/Time Titus Time) Headache [1] MILDMODERATE headache AND [2] present > 3 days (72 hours) AND [3] no improvement after using Care Advice Kesler, RN, Mabelene 10/27/2023 5:56:15 PM Disp. Time Titus Time) Disposition Final User 10/27/2023 5:38:27 PM Attempt made - message left Kirt, RN, Mabelene 10/27/2023 6:02:01 PM SEE PCP WITHIN 3 DAYS Yes Kesler, RN, Mabelene Final Disposition 10/27/2023 6:02:01 PM SEE PCP WITHIN 3 DAYS Yes Kesler, RN, Mabelene Caller Disagree/Comply Comply Caller Understands Yes PreDisposition Go to ED Care Advice Given Per Guideline SEE PCP WITHIN 3 DAYS: * You need to be seen within 2 or 3 days. * PCP VISIT: Call your doctor (or NP/PA) during regular office hours and make an appointment. A clinic or urgent care center are good places to go for care if your doctor's office is closed or you can't get an appointment. NOTE: If office will be open tomorrow, tell caller to call then, not in 3 days. PAIN MEDICINES: * For pain relief, you can take either acetaminophen , ibuprofen , or naproxen . REST FOR HEADACHE: * Lie down in a dark quiet place and relax until feeling better. * Close your eyes and imagine your entire body relaxing. COLD  PACK FOR HEADACHE: * Put a cold pack or an ice bag (wrapped in a moist towel) on the forehead. * Do this for 20 minutes. NECK MASSAGE FOR HEADACHE: * Stretch and massage any tight neck muscles. * This can  sometimes help decrease muscle tension headache pain. CALL BACK IF: * Severe headache lasts over 2 hours after pain medicine * Stiff neck occurs (can't touch chin to chest) * You become worse CARE ADVICE given per Headache (Adult) guideline. Referrals REFERRED TO PCP OFFICE

## 2023-10-29 NOTE — Telephone Encounter (Signed)
 Prescription Request  10/29/2023  LOV: 10/17/2023  What is the name of the medication or equipment? rizatriptan  (MAXALT ) 10 MG tablet   Have you contacted your pharmacy to request a refill? Yes   Which pharmacy would you like this sent to?    [id number] [place], [place] - [address] Phone: [phone]  Fax: [phone]       Patient notified that their request is being sent to the clinical staff for review and that they should receive a response within 2 business days.   Please advise at Mobile [phone] (mobile)

## 2023-11-06 NOTE — Addendum Note (Signed)
 Addended by: Yakelin Grenier G on: 11/06/2023 07:11 AM   Modules accepted: Orders

## 2023-11-13 ENCOUNTER — Encounter: Payer: Self-pay | Admitting: Internal Medicine

## 2023-11-13 DIAGNOSIS — R519 Headache, unspecified: Secondary | ICD-10-CM

## 2023-11-14 DIAGNOSIS — H04123 Dry eye syndrome of bilateral lacrimal glands: Secondary | ICD-10-CM | POA: Diagnosis not present

## 2023-11-14 DIAGNOSIS — H353131 Nonexudative age-related macular degeneration, bilateral, early dry stage: Secondary | ICD-10-CM | POA: Diagnosis not present

## 2023-11-14 DIAGNOSIS — H5203 Hypermetropia, bilateral: Secondary | ICD-10-CM | POA: Diagnosis not present

## 2023-11-14 DIAGNOSIS — H40013 Open angle with borderline findings, low risk, bilateral: Secondary | ICD-10-CM | POA: Diagnosis not present

## 2023-11-14 DIAGNOSIS — H2513 Age-related nuclear cataract, bilateral: Secondary | ICD-10-CM | POA: Diagnosis not present

## 2023-11-14 DIAGNOSIS — H52223 Regular astigmatism, bilateral: Secondary | ICD-10-CM | POA: Diagnosis not present

## 2023-11-14 DIAGNOSIS — H524 Presbyopia: Secondary | ICD-10-CM | POA: Diagnosis not present

## 2023-11-14 MED ORDER — RIZATRIPTAN BENZOATE 10 MG PO TABS: 10.0000 mg | ORAL_TABLET | ORAL | 11 refills | Status: DC | PRN

## 2023-11-14 NOTE — Addendum Note (Signed)
 Addended by: Vinia Jemmott G on: 11/14/2023 08:05 AM   Modules accepted: Orders

## 2023-11-16 ENCOUNTER — Ambulatory Visit: Admitting: Psychology

## 2023-11-16 DIAGNOSIS — F331 Major depressive disorder, recurrent, moderate: Secondary | ICD-10-CM | POA: Diagnosis not present

## 2023-11-16 NOTE — Progress Notes (Signed)
 [facility] Counselor/Therapist Progress Note  Patient ID: Jordan KARWOWSKI, MRN: [id number],    Date: 11/16/2023  Time Spent: 3:00pm-3:45pm   45 minutes   Treatment Type: Individual Therapy  Reported Symptoms: stress, sadness, loneliness  Mental Status Exam: Appearance:  Casual     Behavior: Appropriate  Motor: Normal  Speech/Language:  Normal Rate  Affect: Appropriate  Mood: normal  Thought process: normal  Thought content:   WNL  Sensory/Perceptual disturbances:   WNL  Orientation: oriented to person, place, time/date, and situation  Attention: Good  Concentration: Good  Memory: WNL  Fund of knowledge:  Good  Insight:   Good  Judgment:  Good  Impulse Control: Good   Risk Assessment: Danger to Self:  No Self-injurious Behavior: No Danger to Others: No Duty to Warn:no Physical Aggression / Violence:No  Access to Firearms a concern: No  Gang Involvement:No   Subjective: Pt present for face-to-face individual therapy via video.  Pt consents to telehealth video session and is aware of limitations and benefits of virtual sessions.  Location of pt: home Location of therapist: home office.  Pt talked about not feeling well and is still having dizziness and migraines since her fall in July. Pt had a concussion.  Pt fell again recently and bruised her hip.  Pt is spending a lot of time in bed and is lonely.   Pt has felt down and her relationship with Jeralyn has ended bc of the geographical distance between them.  Pt feels worried about growing old alone.  Pt is afraid of going out bc she is afraid of falling.  She is also feeling withdrawn.  Addressed how pt can get out and connect with people.   Pt is in charge of planning the family reunion that will occur the end of October.   Pt does walk her dog twice a day and at times with neighbors which helps.   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  Pt may sign up for  classes and may run for legislature in a couple of years.   Pt is feeling badly about herself and is hard on herself bc she feels she has failed at a lot of things.  Pt states she is not reliable to show up anymore bc of fear of failure and being judged.  Helped pt process her feelings and addressed how she can engage in a small social activity.  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  Worked on self care strategies.  Provided supportive therapy.     Interventions: Cognitive Behavioral Therapy and Insight-Oriented  Diagnosis:  F33.1  Plan of Care: Recommend ongoing therapy.  Pt participated in setting treatment goals.  She wants to improve coping skills and self esteem.   Plan to meet monthly.  Pt agrees with treatment plan.   Treatment Plan (Treatment Plan Target Date:  06/27/2024) Client Abilities/Strengths  Pt is bright, engaging, and motivated for therapy.   Client Treatment Preferences  Individual therapy.  Client Statement of Needs  Improve coping skills.  Symptoms  Depressed or irritable mood. Low self-esteem. Unresolved grief issues.   Problems Addressed  Unipolar Depression Goals 1. Alleviate depressive symptoms and return to previous level of effective functioning. 2. Appropriately grieve the loss in order to normalize mood and to return to previously adaptive level of functioning. Objective Learn and implement behavioral strategies to overcome depression. Target Date: 2024-06-27 Frequency: Monthly  Progress: 55 Modality: individual  Related Interventions Engage the client in behavioral activation, increasing his/her activity level and contact with sources of reward, while identifying processes that inhibit activation.  Use behavioral techniques such as instruction, rehearsal, role-playing, role reversal, as needed, to facilitate activity in the client's daily life; reinforce success. Assist the client in developing skills that increase the  likelihood of deriving pleasure from behavioral activation (e.g., assertiveness skills, developing an exercise plan, less internal/more external focus, increased social involvement); reinforce success. Objective Identify important people in life, past and present, and describe the quality, good and poor, of those relationships. Target Date: 2024-06-27 Frequency: Monthly  Progress: 55 Modality: individual  Related Interventions Conduct Interpersonal Therapy beginning with the assessment of the client's interpersonal inventory of important past and present relationships; develop a case formulation linking depression to grief, interpersonal role disputes, role transitions, and/or interpersonal deficits). Objective Learn and implement problem-solving and decision-making skills. Target Date: 2024-06-27 Frequency: Monthly  Progress: 55 Modality: individual  Related Interventions Conduct Problem-Solving Therapy using techniques such as psychoeducation, modeling, and role-playing to teach client problem-solving skills (i.e., defining a problem specifically, generating possible solutions, evaluating the pros and cons of each solution, selecting and implementing a plan of action, evaluating the efficacy of the plan, accepting or revising the plan); role-play application of the problem-solving skill to a real life issue. Encourage in the client the development of a positive problem orientation in which problems and solving them are viewed as a natural part of life and not something to be feared, despaired, or avoided. 3. Develop healthy interpersonal relationships that lead to the alleviation and help prevent the relapse of depression. 4. Develop healthy thinking patterns and beliefs about self, others, and the world that lead to the alleviation and help prevent the relapse of depression. 5. Recognize, accept, and cope with feelings of depression. Diagnosis F33.1  Conditions For Discharge Achievement of  treatment goals and objectives   Veva Alma, LCSW                  Nyesha Cliff Baldwyn, LCSW

## 2023-11-20 ENCOUNTER — Encounter: Payer: Self-pay | Admitting: Adult Health

## 2023-11-20 ENCOUNTER — Encounter: Payer: Self-pay | Admitting: Neurology

## 2023-11-20 ENCOUNTER — Ambulatory Visit (INDEPENDENT_AMBULATORY_CARE_PROVIDER_SITE_OTHER): Admitting: Physician Assistant

## 2023-11-20 ENCOUNTER — Ambulatory Visit: Admitting: Neurology

## 2023-11-20 ENCOUNTER — Other Ambulatory Visit: Payer: Self-pay | Admitting: Gastroenterology

## 2023-11-20 VITALS — BP 121/76 | HR 84 | Ht 63.0 in | Wt 131.0 lb

## 2023-11-20 DIAGNOSIS — G43709 Chronic migraine without aura, not intractable, without status migrainosus: Secondary | ICD-10-CM | POA: Diagnosis not present

## 2023-11-20 DIAGNOSIS — R519 Headache, unspecified: Secondary | ICD-10-CM

## 2023-11-20 MED ORDER — TIZANIDINE HCL 2 MG PO TABS: 2.0000 mg | ORAL_TABLET | Freq: Three times a day (TID) | ORAL | 5 refills | Status: AC | PRN

## 2023-11-20 MED ORDER — ONDANSETRON 4 MG PO TBDP: 4.0000 mg | ORAL_TABLET | Freq: Three times a day (TID) | ORAL | 6 refills | Status: AC | PRN

## 2023-11-20 MED ORDER — RIZATRIPTAN BENZOATE 10 MG PO TABS: 10.0000 mg | ORAL_TABLET | ORAL | 11 refills | Status: DC | PRN

## 2023-11-20 MED ORDER — NURTEC 75 MG PO TBDP: 75.0000 mg | ORAL_TABLET | ORAL | 11 refills | Status: DC

## 2023-11-20 NOTE — Patient Instructions (Addendum)
 Meds ordered this encounter  Medications   Rimegepant Sulfate (NURTEC) 75 MG TBDP    Sig: Take 1 tablet (75 mg total) by mouth every other day. As needed for migraine    Dispense:  16 tablet    Refill:  11  2 tiZANidine (ZANAFLEX) 2 MG tablet    Sig: Take 1 tablet (2 mg total) by mouth every 8 (eight) hours as needed for muscle spasms.    Dispense:  30 tablet    Refill:  5  1 rizatriptan  (MAXALT ) 10 MG tablet    Sig: Take 1 tablet (10 mg total) by mouth as needed for migraine. May repeat in 2 hours if needed    Dispense:  10 tablet    Refill:  11    Do not refill in less than 30 days  3 ondansetron  (ZOFRAN -ODT) 4 MG disintegrating tablet    Sig: Take 1 tablet (4 mg total) by mouth every 8 (eight) hours as needed.    Dispense:  20 tablet    Refill:  6    4. aleve 

## 2023-11-20 NOTE — Progress Notes (Signed)
 Chief Complaint  Patient presents with   New Patient (Initial Visit)    Rm14, alone, Urgent internal referral for Recurrent falls and dizziness and headaches, concern for MS on MRI. Ortho static bp completed. Falls screening completed. Pt stated that she hsa headache/migraine daily since fall on July 4th accompanied w/dizziness. Pt denied postural or positional changes causing dizziness      ASSESSMENT AND PLAN  Jordan [name] is a 63 y.o. female   Worsening migraine triggered by head trauma  Nurtec 75 mg every other day as preventative medication and rescue  For prolonged severe headaches may try Maxalt  combined with tizanidine Zofran  NSAIDs  She will contact clinic if her above measures fail to improve her symptoms,  If her headache gradually improved she will continue follow-up with her primary care  DIAGNOSTIC DATA (LABS, IMAGING, TESTING) - I reviewed patient records, labs, notes, testing and imaging myself where available.   MEDICAL HISTORY:  Jordan [name] is a 63 year old female, seen in request by Dr. Jesus Motto for evaluation of headache, initial evaluation November 20, 2023  History is obtained from the patient and review of electronic medical records. I personally reviewed pertinent available imaging films in PACS.   PMHx of  Depression, anxiety, on disability since 2015 GERD  Chronic migraine   She had long history of depression anxiety, went on disability due to that, she did have history of intermittent migraine headaches when she was younger, she most attributed to her right ear problem, had right ear surgery in the past  CT temporal bone May 2025 showed abnormal right temporal bone, right middle ear ossicles are largely absent eroded, thickened irregular right tympanic membrane, but the right tympanic cavity, mastoid remain clear, superimposed thinning/loss of the bony covering of the bilateral superior semicircular canal, but otherwise normal left temporal  bone  On August 24, 2023, she jumped out of the bed from her dream, bumped her left head into the corner of the nightstand, had a bruise, was treated at emergency room CT orbit showed no fracture, chronic right maxillary sinusitis  Ever since the event she has constant left-sided headaches, left retro-orbital area pain, with light, noise sensitivity, spreading down to her left face, shoulder,  She was given Maxalt  as needed, did help her headache well, but she had to take more than 1 dose in a day   PHYSICAL EXAM:   Vitals:   11/20/23 1052 11/20/23 1054 11/20/23 1055  BP: 101/64 111/74 121/76  Pulse: 75 81 84  SpO2: 95% 96% 95%  Weight: 131 lb (59.4 kg)    Height: 5' 3 (1.6 m)       Body mass index is 23.21 kg/m.  PHYSICAL EXAMNIATION:  Gen: NAD, conversant, well nourised, well groomed                     Cardiovascular: Regular rate rhythm, no peripheral edema, warm, nontender. Eyes: Conjunctivae clear without exudates or hemorrhage Neck: Supple, no carotid bruits. Pulmonary: Clear to auscultation bilaterally   NEUROLOGICAL EXAM:  MENTAL STATUS: Speech/cognition: Awake, alert, oriented to history taking and casual conversation CRANIAL NERVES: CN II: Visual fields are full to confrontation. Pupils are round equal and briskly reactive to light. CN III, IV, VI: extraocular movement are normal. No ptosis. CN V: Facial sensation is intact to light touch CN VII: Face is symmetric with normal eye closure  CN VIII: Hearing is normal to causal conversation. CN IX, X: Phonation is normal. CN  XI: Head turning and shoulder shrug are intact  MOTOR: There is no pronator drift of out-stretched arms. Muscle bulk and tone are normal. Muscle strength is normal.  REFLEXES: Reflexes are 2+ and symmetric at the biceps, triceps, knees, and ankles. Plantar responses are flexor.  SENSORY: Intact to light touch, pinprick and vibratory sensation are intact in fingers and  toes.  COORDINATION: There is no trunk or limb dysmetria noted.  GAIT/STANCE: Posture is normal. Gait is steady    REVIEW OF SYSTEMS:  Full 14 system review of systems performed and notable only for as above All other review of systems were negative.   ALLERGIES: Allergies  Allergen Reactions   Gabapentin  Other (See Comments)   Mirtazapine Other (See Comments)    Severe hallucinations   Pregabalin Other (See Comments)    Delusions   Oxycodone Other (See Comments)    Severe hallucinations   Oxycodone Hcl     HOME MEDICATIONS: Current Outpatient Medications  Medication Sig Dispense Refill   ALPRAZolam  (XANAX ) 0.5 MG tablet Take one tablet by mouth twice daily and an additional 1/2 tablet as needed for severe anxiety. 75 tablet 5   busPIRone  (BUSPAR ) 30 MG tablet Take 1 tablet (30 mg total) by mouth 2 (two) times daily. 60 tablet 3   Cholecalciferol (VITAMIN D3) 125 MCG (5000 UT) CAPS TAKE 1 CAPSULE BY MOUTH DAILY 100 capsule 4   Cyanocobalamin  (B-12) 1000 MCG SUBL Place 1 tablet under the tongue daily at 6 (six) AM. 90 tablet 3   dicyclomine  (BENTYL ) 10 MG capsule TAKE ONE CAPSULE BY MOUTH EVERY 6 HOURS AS NEEDED FOR ABDOMINAL PAIN 30 capsule 1   DULoxetine  (CYMBALTA ) 60 MG capsule Take 1 capsule (60 mg total) by mouth 2 (two) times daily. 200 capsule 1   esomeprazole  (NEXIUM ) 40 MG capsule TAKE 1 CAPSULE BY MOUTH TWICE  DAILY BEFORE MEALS 200 capsule 1   fluticasone  (FLONASE ) 50 MCG/ACT nasal spray SPRAY 1 SPRAY IN EACH NOSTRIL ONCE DAILY AS NEEDED FOR ALLERGIES OR RHINITIS 16 mL 2   lamoTRIgine  (LAMICTAL ) 25 MG tablet Take 1 tablet (25 mg total) by mouth 2 (two) times daily. 60 tablet 1   meclizine  (ANTIVERT ) 25 MG tablet Take 1 tablet (25 mg total) by mouth 3 (three) times daily as needed for dizziness. 30 tablet 1   Omega-3 Fatty Acids (FISH OIL ) 1000 MG CAPS Take 2 capsules (2,000 mg total) by mouth 2 (two) times daily before lunch and supper. 360 capsule 3   rizatriptan   (MAXALT ) 10 MG tablet Take 1 tablet (10 mg total) by mouth as needed for migraine. May repeat in 2 hours if needed 10 tablet 11   tiZANidine (ZANAFLEX) 2 MG tablet TAKE 1 TABLET BY MOUTH 3 TIMES A DAY AS NEEDED FOR PAIN/SPASM     topiramate  (TOPAMAX ) 25 MG tablet Take 1 tablet (25 mg total) by mouth 2 (two) times daily. 60 tablet 2   traZODone  (DESYREL ) 50 MG tablet TAKE 1 TABLET BY MOUTH AT  BEDTIME 90 tablet 0   No current facility-administered medications for this visit.    PAST MEDICAL HISTORY: Past Medical History:  Diagnosis Date   Acute upper respiratory infections of unspecified site    ADD (attention deficit disorder)    Allergic rhinitis due to pollen    Allergy    Hay fever, leaf season   Anal fissure    Anemia, unspecified    Anxiety disorder    Asthma    Carpal tunnel syndrome 04/25/2007  Qualifier: Diagnosis of   By: Harlow MD, Ozell BRAVO        Cataract 2024   One developing in left eye   Chicken pox    Complication of anesthesia    wakes up slow   Cough 07/03/2011   Followed in Pulmonary clinic/ [facility]/ [place]      - Spirometry nl during flare 07/03/2011      Had severe asthmatic bronchitis vs pneumonia requiring hospitalization April '13 for severe bronchospasm. Subsequently had persistent cough and bronchospasm for several months.      Depression, recurrent 12/05/2006   Qualifier: Diagnosis of   By: Lang CMA, Jessica      History or severe depression - followed by Dr. Vincente  Additional diagnosis: ADD, agraphobia, self-mutilation.  Med added - abilify     Depressive disorder, not elsewhere classified    Diffuse cystic mastopathy    Falls    Fibromyalgia    GERD (gastroesophageal reflux disease)    Headache(784.0)    History of anorexia nervosa    History of bulimia    IBS (irritable bowel syndrome)    IGT (impaired glucose tolerance) 03/24/2021   Lab Results      Component    Value    Date           HGBA1C    5.3    04/13/2022            Incontinence without sensory awareness 06/01/2009   Qualifier: Diagnosis of   By: Harlow MD, Ozell BRAVO        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    Iron deficiency anemia 03/24/2021   Lump or mass in breast    Meningitis, unspecified(322.9)    NASAL POLYP 05/14/2009   NECK PAIN, CHRONIC 02/19/2007   Qualifier: Diagnosis of   By: Harlow MD, Ozell BRAVO        Oral thrush    Other specified glaucoma    Perforation of tympanic membrane, unspecified    Radial styloid tenosynovitis    Seizures (HCC)    Stroke (HCC) 2015   Debated diagnosis mini strokes   TYMPANIC MEMBRANE PERFORATION 12/05/2006   Annotation: REPAIRED  Qualifier: Diagnosis of   By: Lang CMA, Jessica      Replacing diagnoses that were inactivated after the 05/22/22 regulatory import      PAST SURGICAL HISTORY: Past Surgical History:  Procedure Laterality Date   ABDOMINAL HYSTERECTOMY  2010   Uterine Ablation   ADENOIDECTOMY     ANKLE SURGERY  1992   right ankle - scar tissue   BREAST SURGERY  2018   Breast reduction   COSMETIC SURGERY  2019   Breast reduction   DILATION AND CURETTAGE OF UTERUS  2006   History of Menometrorrhagia   EYE SURGERY  2006   Glaucoma   INNER EAR SURGERY  1966 to 1986   6 surgeries on right ear   LAPAROSCOPY  1993   normal   REDUCTION MAMMAPLASTY Bilateral 2018   TUBAL LIGATION  2010   Uterine ablation tubes blocked   uterine ablation  2011   WRIST SURGERY  2001   for de-quervaine - bilateral wrists - tendonitis    FAMILY HISTORY: Family History  Problem Relation Age of Onset   Dementia Mother    Alcohol abuse Mother    Ovarian cancer Mother    Heart disease Mother    Hypertension Mother    Coronary artery disease Mother    Colon polyps  Mother 75   Thyroid  disease Mother    ADD / ADHD Mother    Cancer Mother    Heart failure Father    Hypertension Father    Hyperlipidemia Father    Diabetes Father    Arthritis Father    Depression Father    Hearing loss Father    Heart  disease Father    Obesity Father    Stroke Father    Vision loss Father    Alcohol abuse Sister    Breast cancer Maternal Grandmother    Alcohol abuse Maternal Grandfather    Anxiety disorder Maternal Aunt    Depression Maternal Aunt    Alcohol abuse Maternal Aunt    Alcohol abuse Maternal Uncle    ADD / ADHD Sister    Alcohol abuse Sister    Anxiety disorder Sister    Depression Sister    Drug abuse Sister    ADD / ADHD Sister    Alcohol abuse Sister    Anxiety disorder Sister    Depression Sister    Drug abuse Sister    Colon cancer Neg Hx    Esophageal cancer Neg Hx    Stomach cancer Neg Hx    Rectal cancer Neg Hx     SOCIAL HISTORY: Social History   Socioeconomic History   Marital status: Divorced    Spouse name: Not on file   Number of children: 0   Years of education: Masters   Highest education level: Master's degree (e.g., MA, MS, MEng, MEd, MSW, MBA)  Occupational History   Occupation: part time Airline pilot   Occupation: disability  Tobacco Use   Smoking status: Never   Smokeless tobacco: Never  Vaping Use   Vaping status: Never Used  Substance and Sexual Activity   Alcohol use: Yes    Alcohol/week: 7.0 standard drinks of alcohol    Types: 7 Glasses of wine per week    Comment: 2 glasses of wine per night   Drug use: No   Sexual activity: Not Currently  Other Topics Concern   Not on file  Social History Narrative   [employer], [employer] BA, App for MA. 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 Married 5 years - divorced, no children   Patient is right handed   Patient drinks 1 cup of coffee daily.               Social Drivers of Health   Financial Resource Strain: Medium Risk (08/14/2023)   Overall Financial Resource Strain (CARDIA)    Difficulty of Paying Living Expenses: Somewhat hard  Food Insecurity: Food Insecurity Present (08/14/2023)   Hunger Vital Sign    Worried  About Running Out of Food in the Last Year: Sometimes true    Ran Out of Food in the Last Year: Never true  Transportation Needs: No Transportation Needs (08/14/2023)   PRAPARE - Administrator, Civil Service (Medical): No    Lack of Transportation (Non-Medical): No  Physical Activity: Insufficiently Active (08/14/2023)   Exercise Vital Sign    Days of Exercise per Week: 5 days    Minutes of Exercise per Session: 20 min  Stress: Stress Concern Present (08/14/2023)   [employer] of Occupational Health - Occupational Stress Questionnaire    Feeling of Stress: To some extent  Social Connections: Moderately Isolated (08/14/2023)   Social Connection and Isolation Panel  Frequency of Communication with Friends and Family: Once a week    Frequency of Social Gatherings with Friends and Family: Twice a week    Attends Religious Services: 1 to 4 times per year    Active Member of [employer] or Organizations: No    Attends Banker Meetings: Not on file    Marital Status: Divorced  Intimate Partner Violence: Not At Risk (04/12/2022)   Humiliation, Afraid, Rape, and Kick questionnaire    Fear of Current or Ex-Partner: No    Emotionally Abused: No    Physically Abused: No    Sexually Abused: No      Modena Callander, M.D. Ph.D.  [facility] [address] [place], [place] [address] Ph: [phone] Fax: [phone]  CC:  Jesus Bernardino MATSU, MD [address] [place],  [place] [address]  Jesus Bernardino MATSU, MD

## 2023-11-21 ENCOUNTER — Encounter: Payer: Self-pay | Admitting: Neurology

## 2023-11-22 ENCOUNTER — Telehealth: Payer: Self-pay

## 2023-11-22 ENCOUNTER — Other Ambulatory Visit (HOSPITAL_COMMUNITY): Payer: Self-pay

## 2023-11-22 NOTE — Telephone Encounter (Signed)
 Pharmacy Patient Advocate Encounter   Received notification from Patient Advice Request messages that prior authorization for Nurtec 75mg  Tablet is required/requested.   Insurance verification completed.   The patient is insured through [facility].   Per test claim: PA required; PA started via CoverMyMeds. KEY [id number] . Waiting for clinical questions to populate.

## 2023-11-22 NOTE — Telephone Encounter (Signed)
 Clinical questions have been answered and PA submitted. PA currently Pending. Please be advised that most companies allow up to 30 days to make a decision. We will advise when a determination has been made, or follow up in 1 week.   Please reach out to our team, Rx Prior Auth Pool, if you haven't heard back in a week.

## 2023-11-22 NOTE — Telephone Encounter (Signed)
 Pharmacy Patient Advocate Encounter   Received notification from Patient Advice Request messages that prior authorization for Rizatriptan  10mg  Tablet is required/requested.   Insurance verification completed.   The patient is insured through [facility].   Per test claim: Refill too soon. PA is not needed at this time. Medication was filled 11/05/2023. Next eligible fill date is 11/24/2023.

## 2023-11-27 ENCOUNTER — Other Ambulatory Visit (HOSPITAL_COMMUNITY): Payer: Self-pay

## 2023-11-27 NOTE — Telephone Encounter (Signed)
 Pharmacy Patient Advocate Encounter  Received notification from [employer] that Prior Authorization for Nurtec has been APPROVED from 11/22/2023 to 02/19/2025. Ran test claim, Copay is $635.35. This test claim was processed through [facility]- copay amounts may vary at other pharmacies due to pharmacy/plan contracts, or as the patient moves through the different stages of their insurance plan.   PA #/Case ID/Reference #: [id number]   I sent PT a MyChart to let he know of approval, the high copay, and to either outreach [facility] to discuss a different direction for preventative medications and also that their medicare plan offers a payment plan.

## 2023-12-01 ENCOUNTER — Other Ambulatory Visit: Payer: Self-pay | Admitting: Behavioral Health

## 2023-12-01 DIAGNOSIS — R454 Irritability and anger: Secondary | ICD-10-CM

## 2023-12-07 ENCOUNTER — Ambulatory Visit: Admitting: Behavioral Health

## 2023-12-07 ENCOUNTER — Encounter: Payer: Self-pay | Admitting: Behavioral Health

## 2023-12-07 DIAGNOSIS — F900 Attention-deficit hyperactivity disorder, predominantly inattentive type: Secondary | ICD-10-CM

## 2023-12-07 DIAGNOSIS — F411 Generalized anxiety disorder: Secondary | ICD-10-CM

## 2023-12-07 DIAGNOSIS — R454 Irritability and anger: Secondary | ICD-10-CM

## 2023-12-07 DIAGNOSIS — F331 Major depressive disorder, recurrent, moderate: Secondary | ICD-10-CM

## 2023-12-07 MED ORDER — TRAZODONE HCL 50 MG PO TABS: 50.0000 mg | ORAL_TABLET | Freq: Every day | ORAL | 3 refills | Status: DC

## 2023-12-07 MED ORDER — LAMOTRIGINE 25 MG PO TABS: 25.0000 mg | ORAL_TABLET | Freq: Two times a day (BID) | ORAL | 3 refills | Status: DC

## 2023-12-07 NOTE — Progress Notes (Signed)
 [facility]  Patient ID: Jordan [name],  MRN: [id number]  PCP: Jesus Bernardino MATSU, MD  Date of Evaluation: 12/07/2023 Time spent:30 minutes  Chief Complaint:  Chief Complaint   Anxiety; Depression; Follow-up; Medication Refill; Patient Education     HISTORY/CURRENT STATUS: HPI Jordan [name], 63 year old patient presents to this office face to face visit for  follow up and medication management.  She is doing OK. Struggling with some boredom and adapting to life changes. Trying to plan and discover new activities and maybe explore hobbies she may be interested in. Says that she feels great emotionally. Reporting no depression. Says her depression today is 0/10 and anxiety 2/10. She is sleeping 7-8 hours per night. She denies any current or prior mania.No psychosis, no auditory or visual hallucinations. Strong family hx of dementia. No SI or HI.   Prior psychiatric medication trials:   Abilify Latuda Risperdone Amitriptyline  Trintellix Lexapro Prozac Zoloft Paxil Wellbutrin  Remeron Effexor Cymbalta  Rexulti      Individual Medical History/ Review of Systems: Changes? :No   Allergies: Gabapentin , Mirtazapine, Pregabalin, Oxycodone, and Oxycodone hcl  Current Medications:  Current Outpatient Medications:    ALPRAZolam  (XANAX ) 0.5 MG tablet, Take one tablet by mouth twice daily and an additional 1/2 tablet as needed for severe anxiety., Disp: 75 tablet, Rfl: 5   busPIRone  (BUSPAR ) 30 MG tablet, Take 1 tablet (30 mg total) by mouth 2 (two) times daily., Disp: 60 tablet, Rfl: 3   Cholecalciferol (VITAMIN D3) 125 MCG (5000 UT) CAPS, TAKE 1 CAPSULE BY MOUTH DAILY, Disp: 100 capsule, Rfl: 4   Cyanocobalamin  (B-12) 1000 MCG SUBL, Place 1 tablet under the tongue daily at 6 (six) AM., Disp: 90 tablet, Rfl: 3   dicyclomine  (BENTYL ) 10 MG capsule, TAKE ONE CAPSULE BY MOUTH EVERY 6 HOURS AS NEEDED FOR ABDOMINAL PAIN, Disp: 30 capsule, Rfl: 1   DULoxetine  (CYMBALTA ) 60 MG  capsule, Take 1 capsule (60 mg total) by mouth 2 (two) times daily., Disp: 200 capsule, Rfl: 1   esomeprazole  (NEXIUM ) 40 MG capsule, Take 1 capsule (40 mg total) by mouth 2 (two) times daily before a meal. Please schedule an office visit for further refills. Thank you, Disp: 100 capsule, Rfl: 0   fluticasone  (FLONASE ) 50 MCG/ACT nasal spray, SPRAY 1 SPRAY IN EACH NOSTRIL ONCE DAILY AS NEEDED FOR ALLERGIES OR RHINITIS, Disp: 16 mL, Rfl: 2   lamoTRIgine  (LAMICTAL ) 25 MG tablet, Take 1 tablet (25 mg total) by mouth 2 (two) times daily., Disp: 60 tablet, Rfl: 3   meclizine  (ANTIVERT ) 25 MG tablet, Take 1 tablet (25 mg total) by mouth 3 (three) times daily as needed for dizziness., Disp: 30 tablet, Rfl: 1   Omega-3 Fatty Acids (FISH OIL ) 1000 MG CAPS, Take 2 capsules (2,000 mg total) by mouth 2 (two) times daily before lunch and supper., Disp: 360 capsule, Rfl: 3   ondansetron  (ZOFRAN -ODT) 4 MG disintegrating tablet, Take 1 tablet (4 mg total) by mouth every 8 (eight) hours as needed., Disp: 20 tablet, Rfl: 6   Rimegepant Sulfate (NURTEC) 75 MG TBDP, Take 1 tablet (75 mg total) by mouth every other day. As needed for migraine, Disp: 16 tablet, Rfl: 11   rizatriptan  (MAXALT ) 10 MG tablet, Take 1 tablet (10 mg total) by mouth as needed for migraine. May repeat in 2 hours if needed, Disp: 10 tablet, Rfl: 11   tiZANidine (ZANAFLEX) 2 MG tablet, Take 1 tablet (2 mg total) by mouth every 8 (eight) hours as needed for  muscle spasms., Disp: 30 tablet, Rfl: 5   traZODone  (DESYREL ) 50 MG tablet, Take 1 tablet (50 mg total) by mouth at bedtime., Disp: 90 tablet, Rfl: 3 Medication Side Effects: none  Family Medical/ Social History: Changes? No  MENTAL HEALTH EXAM:  There were no vitals taken for this visit.There is no height or weight on file to calculate BMI.  General Appearance: Casual, Neat, and Well Groomed  Eye Contact:  Good  Speech:  Clear and Coherent  Volume:  Normal  Mood:  NA  Affect:  Appropriate   Thought Process:  Coherent  Orientation:  Full (Time, Place, and Person)  Thought Content: Logical   Suicidal Thoughts:  No  Homicidal Thoughts:  No  Memory:  WNL  Judgement:  Good  Insight:  Good  Psychomotor Activity:  Normal  Concentration:  Concentration: Good  Recall:  Good  Fund of Knowledge: Good  Language: Good  Assets:  Desire for Improvement  ADL's:  Intact  Cognition: WNL  Prognosis:  Good    DIAGNOSES:    ICD-10-CM   1. Irritability  R45.4 lamoTRIgine  (LAMICTAL ) 25 MG tablet    2. Generalized anxiety disorder  F41.1 traZODone  (DESYREL ) 50 MG tablet    3. Major depressive disorder, recurrent episode, moderate (HCC)  F33.1 traZODone  (DESYREL ) 50 MG tablet    4. Attention deficit hyperactivity disorder (ADHD), predominantly inattentive type  F90.0 traZODone  (DESYREL ) 50 MG tablet      Receiving Psychotherapy: No    RECOMMENDATIONS:   Greater than 50% of 30 face to face time with patient was spent on counseling and coordination of care. We discussed her current stability. Talked about her boredom producing more fatigue. She understands that she does much better when planning activities and and staying busy.  Request no changes to medication this visit.  At risk for loneliness.    We agreed to:   Will continue Lamictal  50 mg daily Will continue Cymbalta  60 mg twice daily To continue Trazodone  50 mg at bedtime as reported by pt Continue Buspar  to 30 mg twice daily, at least 7-8 hours between doses. Increase Xanax  0.5 mg twice daily and additional 1/2 tablet for severe anxiety only.  To report worsening symptoms or side effects promptly Will follow up in 3 months to reassess Provided emergency contact information Discussed potential metabolic side effects associated with atypical antipsychotics, as well as potential risk for movement side effects. Advised pt to contact office if movement side effects occur.   Reviewed PDMP       [name], NP

## 2023-12-12 ENCOUNTER — Other Ambulatory Visit: Payer: Medicare Other

## 2023-12-12 ENCOUNTER — Encounter: Payer: Self-pay | Admitting: Neurology

## 2023-12-14 ENCOUNTER — Encounter: Payer: Self-pay | Admitting: Internal Medicine

## 2023-12-21 ENCOUNTER — Ambulatory Visit: Admitting: Psychology

## 2023-12-27 ENCOUNTER — Other Ambulatory Visit: Payer: Self-pay | Admitting: Gastroenterology

## 2024-01-15 ENCOUNTER — Ambulatory Visit: Admitting: Neurology

## 2024-01-15 ENCOUNTER — Encounter: Payer: Self-pay | Admitting: Neurology

## 2024-01-15 ENCOUNTER — Telehealth: Payer: Self-pay

## 2024-01-15 VITALS — BP 119/77 | HR 76 | Ht 63.0 in | Wt 136.6 lb

## 2024-01-15 DIAGNOSIS — G43709 Chronic migraine without aura, not intractable, without status migrainosus: Secondary | ICD-10-CM

## 2024-01-15 DIAGNOSIS — R519 Headache, unspecified: Secondary | ICD-10-CM

## 2024-01-15 MED ORDER — BETAMETHASONE SOD PHOS & ACET 6 (3-3) MG/ML IJ SUSP: 9.0000 mg | Freq: Once | INTRAMUSCULAR | Status: AC

## 2024-01-15 MED ORDER — BUPIVACAINE HCL (PF) 0.5 % IJ SOLN: 7.5000 mL | Freq: Once | INTRAMUSCULAR | Status: AC

## 2024-01-15 NOTE — Progress Notes (Unsigned)
 Chief Complaint  Patient presents with   Follow-up    Pt in room 14. Alone.Here for post concussion issues    ASSESSMENT AND PLAN  Jordan [name] is a 63 y.o. female   Worsening migraine   Nurtec 75 mg every other day as preventative medication and rescue  For prolonged severe headaches may try Maxalt  combined with tizanidine  Zofran  NSAIDs  MRI of the brain [facility] February 2025, mild small vessel disease no acute abnormality, but previous neurologist and primary care mention the possibility of MS, this has caused a lot of anxiety on her, will repeat MRI of the brain in February 2026, she will contact office then for MRI orders  Persistent bilateral temporoparietal, right occipital area pain  Performed trigger point injection today, document separately  DIAGNOSTIC DATA (LABS, IMAGING, TESTING) - I reviewed patient records, labs, notes, testing and imaging myself where available.   MEDICAL HISTORY:  Jordan [name] is a 63 year old female, seen in request by Dr. Jesus Motto for evaluation of headache, initial evaluation November 20, 2023  History is obtained from the patient and review of electronic medical records. I personally reviewed pertinent available imaging films in PACS.   PMHx of  Depression, anxiety, on disability since 2015 GERD  Chronic migraine   She had long history of depression anxiety, went on disability due to that, she did have history of intermittent migraine headaches when she was younger, she attributed her headache to her right ear problem, had right ear surgery in the past  CT temporal bone May 2025 showed abnormal right temporal bone, right middle ear ossicles are largely absent eroded, thickened irregular right tympanic membrane, but the right tympanic cavity, mastoid remain clear, superimposed thinning/loss of the bony covering of the bilateral superior semicircular canal, but otherwise normal left temporal bone  On August 24, 2023, she jumped  out of the bed from her dream, bumped her left head into the corner of the nightstand, had a bruise, was treated at emergency room  CT orbit showed no fracture, chronic right maxillary sinusitis  Ever since the event she has constant left-sided headaches, left retro-orbital area pain, with light, noise sensitivity, spreading down to her left face, shoulder,  She was given Maxalt  as needed, did help her headache well, but she had to take more than 1 dose in a day  UPDATE Jan 15 2024: She complains of daily headaches across forehead, she takes ibuprofen  200mg  x4 tabs every morning, she did have surgery of her right hip in March 2025, it did help her right leg, hip pain, but still in recovery,   She has been on disability since 2015, due to major depression, fibromyalgia.  Long history of depression, taking cymbalta  60mg  bid, buspar  30mg  bid, Lamotrigine  25mg  bid, trazodone  25mg  at bedtime, xanax  1mg  at bedtime for sleep.  [facility] MRI brain w/wo Apr 06 2023. 1. No acute intracranial abnormality.  2.  Multiple small discrete white matter lesions are identified. These lesions are abnormal but nonspecific, usually resulting from benign/remote/incidental causes (e.g. prior inflammation/demyelinization, or chronic ischemia associated with migraines/atherosclerosis/other vasculopathies).    PHYSICAL EXAM:   Vitals:   01/15/24 1534  BP: 119/77  Pulse: 76  Weight: 136 lb 9.6 oz (62 kg)  Height: 5' 3 (1.6 m)     Body mass index is 24.2 kg/m.  PHYSICAL EXAMNIATION:  Gen: NAD, conversant, well nourised, well groomed  Cardiovascular: Regular rate rhythm, no peripheral edema, warm, nontender. Eyes: Conjunctivae clear without exudates or hemorrhage Neck: Supple, no carotid bruits. Pulmonary: Clear to auscultation bilaterally   NEUROLOGICAL EXAM:  MENTAL STATUS: Speech/cognition: Anxious looking middle-age female, awake, alert, oriented to history taking and casual  conversation CRANIAL NERVES: CN II: Visual fields are full to confrontation. Pupils are round equal and briskly reactive to light. CN III, IV, VI: extraocular movement are normal. No ptosis. CN V: Facial sensation is intact to light touch CN VII: Face is symmetric with normal eye closure  CN VIII: Hearing is normal to causal conversation. CN IX, X: Phonation is normal. CN XI: Head turning and shoulder shrug are intact  MOTOR: There is no pronator drift of out-stretched arms. Muscle bulk and tone are normal. Muscle strength is normal.  REFLEXES: Reflexes are 2+ and symmetric at the biceps, triceps, knees, and ankles. Plantar responses are flexor.  SENSORY: Intact to light touch, pinprick and vibratory sensation are intact in fingers and toes.  COORDINATION: There is no trunk or limb dysmetria noted.  GAIT/STANCE: Posture is normal. Gait is steady    REVIEW OF SYSTEMS:  Full 14 system review of systems performed and notable only for as above All other review of systems were negative.   ALLERGIES: Allergies  Allergen Reactions   Gabapentin  Other (See Comments)   Mirtazapine Other (See Comments)    Severe hallucinations   Pregabalin Other (See Comments)    Delusions   Oxycodone Other (See Comments)    Severe hallucinations   Oxycodone Hcl     HOME MEDICATIONS: Current Outpatient Medications  Medication Sig Dispense Refill   ALPRAZolam  (XANAX ) 0.5 MG tablet Take one tablet by mouth twice daily and an additional 1/2 tablet as needed for severe anxiety. 75 tablet 5   busPIRone  (BUSPAR ) 30 MG tablet Take 1 tablet (30 mg total) by mouth 2 (two) times daily. 60 tablet 3   Cholecalciferol (VITAMIN D3) 125 MCG (5000 UT) CAPS TAKE 1 CAPSULE BY MOUTH DAILY 100 capsule 4   Cyanocobalamin  (B-12) 1000 MCG SUBL Place 1 tablet under the tongue daily at 6 (six) AM. 90 tablet 3   dicyclomine  (BENTYL ) 10 MG capsule TAKE ONE CAPSULE BY MOUTH EVERY 6 HOURS AS NEEDED FOR ABDOMINAL PAIN 30  capsule 1   DULoxetine  (CYMBALTA ) 60 MG capsule Take 1 capsule (60 mg total) by mouth 2 (two) times daily. 200 capsule 1   esomeprazole  (NEXIUM ) 40 MG capsule Take 1 capsule (40 mg total) by mouth 2 (two) times daily before a meal. Please schedule an office visit for further refills. Thank you 100 capsule 0   fluticasone  (FLONASE ) 50 MCG/ACT nasal spray SPRAY 1 SPRAY IN EACH NOSTRIL ONCE DAILY AS NEEDED FOR ALLERGIES OR RHINITIS 16 mL 2   lamoTRIgine  (LAMICTAL ) 25 MG tablet Take 1 tablet (25 mg total) by mouth 2 (two) times daily. 60 tablet 3   Omega-3 Fatty Acids (FISH OIL ) 1000 MG CAPS Take 2 capsules (2,000 mg total) by mouth 2 (two) times daily before lunch and supper. 360 capsule 3   ondansetron  (ZOFRAN -ODT) 4 MG disintegrating tablet Take 1 tablet (4 mg total) by mouth every 8 (eight) hours as needed. 20 tablet 6   tiZANidine  (ZANAFLEX ) 2 MG tablet Take 1 tablet (2 mg total) by mouth every 8 (eight) hours as needed for muscle spasms. 30 tablet 5   traZODone  (DESYREL ) 50 MG tablet Take 1 tablet (50 mg total) by mouth at bedtime. (Patient taking differently: Take  50 mg by mouth at bedtime. Takes 1/2 tablet at bedtime) 90 tablet 3   rizatriptan  (MAXALT ) 10 MG tablet Take 1 tablet (10 mg total) by mouth as needed for migraine. May repeat in 2 hours if needed 10 tablet 11   No current facility-administered medications for this visit.    PAST MEDICAL HISTORY: Past Medical History:  Diagnosis Date   Acute upper respiratory infections of unspecified site    ADD (attention deficit disorder)    Allergic rhinitis due to pollen    Allergy    Hay fever, leaf season   Anal fissure    Anemia, unspecified    Anxiety disorder    Asthma    Carpal tunnel syndrome 04/25/2007   Qualifier: Diagnosis of   By: Harlow MD, Ozell BRAVO        Cataract 2024   One developing in left eye   Chicken pox    Complication of anesthesia    wakes up slow   Cough 07/03/2011   Followed in Pulmonary clinic/ [facility]/ [facility]      - Spirometry nl during flare 07/03/2011      Had severe asthmatic bronchitis vs pneumonia requiring hospitalization April '13 for severe bronchospasm. Subsequently had persistent cough and bronchospasm for several months.      Depression, recurrent 12/05/2006   Qualifier: Diagnosis of   By: Lang CMA, Jessica      History or severe depression - followed by Dr. Vincente  Additional diagnosis: ADD, agraphobia, self-mutilation.  Med added - abilify     Depressive disorder, not elsewhere classified    Diffuse cystic mastopathy    Falls    Fibromyalgia    GERD (gastroesophageal reflux disease)    Headache(784.0)    History of anorexia nervosa    History of bulimia    IBS (irritable bowel syndrome)    IGT (impaired glucose tolerance) 03/24/2021   Lab Results      Component    Value    Date           HGBA1C    5.3    04/13/2022           Incontinence without sensory awareness 06/01/2009   Qualifier: Diagnosis of   By: Harlow MD, Ozell BRAVO        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    Iron deficiency anemia 03/24/2021   Lump or mass in breast    Meningitis, unspecified(322.9)    NASAL POLYP 05/14/2009   NECK PAIN, CHRONIC 02/19/2007   Qualifier: Diagnosis of   By: Harlow MD, Ozell BRAVO        Oral thrush    Other specified glaucoma    Perforation of tympanic membrane, unspecified    Radial styloid tenosynovitis    Seizures (HCC)    Stroke (HCC) 2015   Debated diagnosis mini strokes   TYMPANIC MEMBRANE PERFORATION 12/05/2006   Annotation: REPAIRED  Qualifier: Diagnosis of   By: Lang CMA, Jessica      Replacing diagnoses that were inactivated after the 05/22/22 regulatory import      PAST SURGICAL HISTORY: Past Surgical History:  Procedure Laterality Date   ABDOMINAL HYSTERECTOMY  2010   Uterine Ablation   ADENOIDECTOMY     ANKLE SURGERY  1992   right ankle - scar tissue   BREAST SURGERY  2018   Breast reduction   COSMETIC SURGERY  2019   Breast reduction   DILATION AND  CURETTAGE OF UTERUS  2006  History of Menometrorrhagia   EYE SURGERY  2006   Glaucoma   INNER EAR SURGERY  1966 to 1986   6 surgeries on right ear   LAPAROSCOPY  1993   normal   REDUCTION MAMMAPLASTY Bilateral 2018   TUBAL LIGATION  2010   Uterine ablation tubes blocked   uterine ablation  2011   WRIST SURGERY  2001   for de-quervaine - bilateral wrists - tendonitis    FAMILY HISTORY: Family History  Problem Relation Age of Onset   Dementia Mother    Alcohol abuse Mother    Ovarian cancer Mother    Heart disease Mother    Hypertension Mother    Coronary artery disease Mother    Colon polyps Mother 14   Thyroid  disease Mother    ADD / ADHD Mother    Cancer Mother    Heart failure Father    Hypertension Father    Hyperlipidemia Father    Diabetes Father    Arthritis Father    Depression Father    Hearing loss Father    Heart disease Father    Obesity Father    Stroke Father    Vision loss Father    Alcohol abuse Sister    Breast cancer Maternal Grandmother    Alcohol abuse Maternal Grandfather    Anxiety disorder Maternal Aunt    Depression Maternal Aunt    Alcohol abuse Maternal Aunt    Alcohol abuse Maternal Uncle    ADD / ADHD Sister    Alcohol abuse Sister    Anxiety disorder Sister    Depression Sister    Drug abuse Sister    ADD / ADHD Sister    Alcohol abuse Sister    Anxiety disorder Sister    Depression Sister    Drug abuse Sister    Colon cancer Neg Hx    Esophageal cancer Neg Hx    Stomach cancer Neg Hx    Rectal cancer Neg Hx     SOCIAL HISTORY: Social History   Socioeconomic History   Marital status: Divorced    Spouse name: Not on file   Number of children: 0   Years of education: Masters   Highest education level: Master's degree (e.g., MA, MS, MEng, MEd, MSW, MBA)  Occupational History   Occupation: part time airline pilot   Occupation: disability  Tobacco Use   Smoking status: Never   Smokeless tobacco: Never  Vaping Use    Vaping status: Never Used  Substance and Sexual Activity   Alcohol use: Yes    Alcohol/week: 7.0 standard drinks of alcohol    Types: 7 Glasses of wine per week    Comment: 3 glasses of wine per night   Drug use: No   Sexual activity: Not Currently  Other Topics Concern   Not on file  Social History Narrative   [employer], [employer] BA, App for MA. 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 Married 5 years - divorced, no children   Patient is right handed   Patient drinks 1 cup of coffee daily.               Social Drivers of Health   Financial Resource Strain: Medium Risk (08/14/2023)   Overall Financial Resource Strain (CARDIA)    Difficulty of Paying Living Expenses: Somewhat hard  Food Insecurity: Food Insecurity Present (08/14/2023)   Hunger Vital Sign  Worried About Programme Researcher, Broadcasting/film/video in the Last Year: Sometimes true    [employer] of Food in the Last Year: Never true  Transportation Needs: No Transportation Needs (08/14/2023)   PRAPARE - Administrator, Civil Service (Medical): No    Lack of Transportation (Non-Medical): No  Physical Activity: Insufficiently Active (08/14/2023)   Exercise Vital Sign    Days of Exercise per Week: 5 days    Minutes of Exercise per Session: 20 min  Stress: Stress Concern Present (08/14/2023)   [employer] of Occupational Health - Occupational Stress Questionnaire    Feeling of Stress: To some extent  Social Connections: Moderately Isolated (08/14/2023)   Social Connection and Isolation Panel    Frequency of Communication with Friends and Family: Once a week    Frequency of Social Gatherings with Friends and Family: Twice a week    Attends Religious Services: 1 to 4 times per year    Active Member of [employer] or Organizations: No    Attends Banker Meetings: Not on file    Marital Status: Divorced  Intimate Partner Violence: Not At Risk  (04/12/2022)   Humiliation, Afraid, Rape, and Kick questionnaire    Fear of Current or Ex-Partner: No    Emotionally Abused: No    Physically Abused: No    Sexually Abused: No      Modena Callander, M.D. Ph.D.  [facility] [address], [address] [place], [place] [address] Ph: [phone] Fax: [phone]  CC:  Jesus Bernardino MATSU, MD [address] [place],  [place] [address]  Jesus Bernardino MATSU, MD

## 2024-01-15 NOTE — Progress Notes (Unsigned)
   History: persistent bilateral occipital temporal, parietal headache, more on right side   Trigger point injection of bilateral occipital region and  temporal region  Bupivacaine  0.5% 1.5 cc + 1.5 cc of betamethasone  6mg /ml, was injected on the scalp bilaterally at several locations:  -On the right occipital area of the head, 3 injections each side, 0.5 cc per injection at the midpoint between the mastoid process and the occipital protuberance. 2 other injections were done one finger breadth from the initial injection, one at a 10 o'clock position and the other at a 2 o'clock position.  -2 injections of 0.5 cc were done in the temporal regions, 2 fingerbreadths above the tragus of the ear, with the second injection one fingerbreadth posteriorly to the first.   - few injections were placed at left occipital region and temporal region in similar distribution   The patient tolerated the injections well, no complications of the procedure were noted. Injections were made with a 27-gauge needle.

## 2024-01-15 NOTE — Telephone Encounter (Signed)
 Copied from CRM #[id number]. Topic: General - Call Back - No Documentation >> Jan 15, 2024 10:57 AM Harlene ORN wrote: Reason for CRM: Patient is returning a call from Tina. Spoke with front desk and Ellouise hasn't been in the Practice all this week. Please call back the patient to return call.  Tried to call pt back no answer left message to call office back if she has any concerns or questions.

## 2024-01-21 ENCOUNTER — Telehealth: Payer: Self-pay | Admitting: Behavioral Health

## 2024-01-21 NOTE — Telephone Encounter (Signed)
 LVM to [facility]

## 2024-01-21 NOTE — Telephone Encounter (Signed)
 Pt is wanting to know if someone can call to req early rf on her Xanax . She has been having a lot of panic attacks.

## 2024-01-21 NOTE — Telephone Encounter (Signed)
 LF 11/2. Called pharmacy to see when last picked up - 11/5, due 12/3.

## 2024-01-24 ENCOUNTER — Ambulatory Visit: Admitting: Behavioral Health

## 2024-01-28 ENCOUNTER — Ambulatory Visit: Admitting: Psychology

## 2024-01-28 DIAGNOSIS — F331 Major depressive disorder, recurrent, moderate: Secondary | ICD-10-CM

## 2024-01-28 NOTE — Progress Notes (Signed)
 [facility] Counselor/Therapist Progress Note  Patient ID: Jordan [name], MRN: [id number],    Date: 01/28/2024  Time Spent: 3:00pm-3:50pm   50 minutes   Treatment Type: Individual Therapy  Reported Symptoms: stress, sadness, loneliness  Mental Status Exam: Appearance:  Casual     Behavior: Appropriate  Motor: Normal  Speech/Language:  Normal Rate  Affect: Appropriate  Mood: normal  Thought process: normal  Thought content:   WNL  Sensory/Perceptual disturbances:   WNL  Orientation: oriented to person, place, time/date, and situation  Attention: Good  Concentration: Good  Memory: WNL  Fund of knowledge:  Good  Insight:   Good  Judgment:  Good  Impulse Control: Good   Risk Assessment: Danger to Self:  No Self-injurious Behavior: No Danger to Others: No Duty to Warn:no Physical Aggression / Violence:No  Access to Firearms a concern: No  Gang Involvement:No   Subjective: Pt present for face-to-face individual therapy via video.  Pt consents to telehealth video session and is aware of limitations and benefits of virtual sessions.  Location of pt: home Location of therapist: home office.  Pt talked about being very down for awhile bc of the holidays and anniversaries of the death of her mother and a couple of friends who committed suicide.   Pt emailed her sister and her sister told pt that depression runs in their family.  Their father was depressed and pt was not aware of that.  Pt's sister has struggled with depression as well.  Pt's sister is alcoholic and has been sober for 22 years.  Pt feels her sister's email impacted her in a positive way and she understand more where her depression comes from.  Pt's parents put a lot of pressure on pt to be successful bc pt's sister wasn't and was alcoholic.   Pt's sister plans to visit pt this week.  Helped pt process her feelings and family dynamics with her sister.  Pt talked about her health.  She saw a neurologist  who gave her injections to help with pt's pain.   Pt is working with an exercise trainer twice a week.  She is feeling better emotionally since she is not feeling as alone.   Pt walks her dog every day.  Pt is sleeping better.  Pt is going to enroll in a 4 week program [employer] so she may be able to be on the board.  Pt is being intentional about planning some things to look forward to. Worked on self care strategies.  Provided supportive therapy.     Interventions: Cognitive Behavioral Therapy and Insight-Oriented  Diagnosis:  F33.1  Plan of Care: Recommend ongoing therapy.  Pt participated in setting treatment goals.  She wants to improve coping skills and self esteem.   Plan to meet monthly.  Pt agrees with treatment plan.   Treatment Plan (Treatment Plan Target Date:  06/27/2024) Client Abilities/Strengths  Pt is bright, engaging, and motivated for therapy.   Client Treatment Preferences  Individual therapy.  Client Statement of Needs  Improve coping skills.  Symptoms  Depressed or irritable mood. Low self-esteem. Unresolved grief issues.   Problems Addressed  Unipolar Depression Goals 1. Alleviate depressive symptoms and return to previous level of effective functioning. 2. Appropriately grieve the loss in order to normalize mood and to return to previously adaptive level of functioning. Objective Learn and implement behavioral strategies to overcome depression. Target Date: 2024-06-27 Frequency: Monthly  Progress: 55 Modality: individual  Related Interventions Engage the client in  behavioral activation, increasing his/her activity level and contact with sources of reward, while identifying processes that inhibit activation.  Use behavioral techniques such as instruction, rehearsal, role-playing, role reversal, as needed, to facilitate activity in the client's daily life; reinforce success. Assist the client in developing skills that increase the likelihood of deriving  pleasure from behavioral activation (e.g., assertiveness skills, developing an exercise plan, less internal/more external focus, increased social involvement); reinforce success. Objective Identify important people in life, past and present, and describe the quality, good and poor, of those relationships. Target Date: 2024-06-27 Frequency: Monthly  Progress: 55 Modality: individual  Related Interventions Conduct Interpersonal Therapy beginning with the assessment of the client's interpersonal inventory of important past and present relationships; develop a case formulation linking depression to grief, interpersonal role disputes, role transitions, and/or interpersonal deficits). Objective Learn and implement problem-solving and decision-making skills. Target Date: 2024-06-27 Frequency: Monthly  Progress: 55 Modality: individual  Related Interventions Conduct Problem-Solving Therapy using techniques such as psychoeducation, modeling, and role-playing to teach client problem-solving skills (i.e., defining a problem specifically, generating possible solutions, evaluating the pros and cons of each solution, selecting and implementing a plan of action, evaluating the efficacy of the plan, accepting or revising the plan); role-play application of the problem-solving skill to a real life issue. Encourage in the client the development of a positive problem orientation in which problems and solving them are viewed as a natural part of life and not something to be feared, despaired, or avoided. 3. Develop healthy interpersonal relationships that lead to the alleviation and help prevent the relapse of depression. 4. Develop healthy thinking patterns and beliefs about self, others, and the world that lead to the alleviation and help prevent the relapse of depression. 5. Recognize, accept, and cope with feelings of depression. Diagnosis F33.1  Conditions For Discharge Achievement of treatment goals and  objectives   Veva Alma, LCSW

## 2024-02-12 ENCOUNTER — Encounter: Payer: Self-pay | Admitting: Internal Medicine

## 2024-02-12 DIAGNOSIS — F0781 Postconcussional syndrome: Secondary | ICD-10-CM

## 2024-02-15 NOTE — Telephone Encounter (Signed)
 Printed and placed on provider desk.

## 2024-02-17 ENCOUNTER — Encounter: Payer: Self-pay | Admitting: Neurology

## 2024-02-18 ENCOUNTER — Ambulatory Visit: Admitting: Behavioral Health

## 2024-02-18 ENCOUNTER — Telehealth: Payer: Self-pay | Admitting: Behavioral Health

## 2024-02-18 ENCOUNTER — Telehealth: Payer: Self-pay

## 2024-02-18 DIAGNOSIS — R454 Irritability and anger: Secondary | ICD-10-CM

## 2024-02-18 NOTE — Telephone Encounter (Signed)
 Spoke with pt about form needing faxed over the form to generali clobal assistance at [phone]. Also made copy for pt and place in front office for pt to pick up in folder under the C's

## 2024-02-18 NOTE — Telephone Encounter (Signed)
 Patient Jordan [name] at 9:02 to cancel appt she is not feeling well. She also stated that she would like to up dosage for Lamictal  currently taking 25mg  and will need a new prescription for the new dosage. PH: [phone] Appt 1/16 [facility]acy [facility] [address] [place], [place]

## 2024-02-19 ENCOUNTER — Other Ambulatory Visit: Payer: Self-pay | Admitting: Behavioral Health

## 2024-02-19 DIAGNOSIS — F411 Generalized anxiety disorder: Secondary | ICD-10-CM

## 2024-02-19 DIAGNOSIS — F331 Major depressive disorder, recurrent, moderate: Secondary | ICD-10-CM

## 2024-02-19 NOTE — Telephone Encounter (Signed)
 LVM to [facility]. Patient should be taking 50 mg (two 25 mg tablets).

## 2024-02-20 ENCOUNTER — Other Ambulatory Visit: Payer: Self-pay | Admitting: Gastroenterology

## 2024-02-20 NOTE — Telephone Encounter (Signed)
 Yes, this is ok. Please pend refill as needed.

## 2024-02-20 NOTE — Telephone Encounter (Signed)
 Pt prescribed Lamictal  50 mg. She reports she has self-increased dose to an extra 1/2 25-mg tablet once a day. She said it is helping some. She has FU 1/16, but due to the increase she will need a RF earlier. Will send in Rx as appropriate. Please advise.

## 2024-02-20 NOTE — Telephone Encounter (Signed)
 Left second VM to [facility]

## 2024-02-22 ENCOUNTER — Telehealth: Payer: Self-pay | Admitting: Behavioral Health

## 2024-02-22 DIAGNOSIS — R454 Irritability and anger: Secondary | ICD-10-CM

## 2024-02-22 MED ORDER — LAMOTRIGINE 25 MG PO TABS: ORAL_TABLET | ORAL | 0 refills | Status: DC

## 2024-02-22 NOTE — Telephone Encounter (Signed)
 Sent corrected Rx

## 2024-02-22 NOTE — Telephone Encounter (Signed)
 Pharmacy [facility] needs to verify dose on Lamotrigine . RTC [phone]

## 2024-02-22 NOTE — Telephone Encounter (Signed)
 Sent in Rx for 50 mg BID and an additional 1/2 tablet as needed.

## 2024-02-28 NOTE — Telephone Encounter (Signed)
 Patient would like to know if this form could  be placed on her mychart for access?

## 2024-02-28 NOTE — Telephone Encounter (Signed)
 Sent message via mychart to pt

## 2024-03-05 ENCOUNTER — Other Ambulatory Visit: Payer: Self-pay | Admitting: Gastroenterology

## 2024-03-07 ENCOUNTER — Ambulatory Visit: Admitting: Behavioral Health

## 2024-03-07 NOTE — Progress Notes (Signed)
 Patient did not show for scheduled appointment and did not provide 24-hour notice as required.  However, patient is usually present for appointments.  No charge will be assessed at this time.

## 2024-03-08 ENCOUNTER — Encounter (INDEPENDENT_AMBULATORY_CARE_PROVIDER_SITE_OTHER): Payer: Self-pay

## 2024-03-10 ENCOUNTER — Encounter: Payer: Self-pay | Admitting: Internal Medicine

## 2024-03-11 ENCOUNTER — Ambulatory Visit: Admitting: Behavioral Health

## 2024-03-11 ENCOUNTER — Encounter: Payer: Self-pay | Admitting: Behavioral Health

## 2024-03-11 DIAGNOSIS — G35D Multiple sclerosis, unspecified: Secondary | ICD-10-CM

## 2024-03-11 DIAGNOSIS — F331 Major depressive disorder, recurrent, moderate: Secondary | ICD-10-CM | POA: Diagnosis not present

## 2024-03-11 DIAGNOSIS — F99 Mental disorder, not otherwise specified: Secondary | ICD-10-CM

## 2024-03-11 DIAGNOSIS — F900 Attention-deficit hyperactivity disorder, predominantly inattentive type: Secondary | ICD-10-CM | POA: Diagnosis not present

## 2024-03-11 DIAGNOSIS — F5105 Insomnia due to other mental disorder: Secondary | ICD-10-CM

## 2024-03-11 DIAGNOSIS — F411 Generalized anxiety disorder: Secondary | ICD-10-CM

## 2024-03-11 DIAGNOSIS — F39 Unspecified mood [affective] disorder: Secondary | ICD-10-CM | POA: Diagnosis not present

## 2024-03-11 DIAGNOSIS — R454 Irritability and anger: Secondary | ICD-10-CM

## 2024-03-11 MED ORDER — LAMOTRIGINE 100 MG PO TABS: 100.0000 mg | ORAL_TABLET | Freq: Every day | ORAL | 2 refills | Status: AC

## 2024-03-11 MED ORDER — BUSPIRONE HCL 30 MG PO TABS: 30.0000 mg | ORAL_TABLET | Freq: Two times a day (BID) | ORAL | 0 refills | Status: AC

## 2024-03-11 MED ORDER — TRAZODONE HCL 50 MG PO TABS: 50.0000 mg | ORAL_TABLET | Freq: Every day | ORAL | 3 refills | Status: AC

## 2024-03-11 MED ORDER — DULOXETINE HCL 60 MG PO CPEP: 60.0000 mg | ORAL_CAPSULE | Freq: Two times a day (BID) | ORAL | 1 refills | Status: AC

## 2024-03-11 NOTE — Progress Notes (Signed)
 "     [facility]  Patient ID: Jordan [name],  MRN: [id number]  PCP: Jesus Bernardino MATSU, MD  Date of Evaluation: 03/11/2024 Time spent:30 minutes  Chief Complaint:  Chief Complaint   Depression; Anxiety; Follow-up; Medication Refill; Patient Education; Stress     HISTORY/CURRENT STATUS: HPI  Jordan [name], 64 year old patient presents to this office face to face visit for  follow up and medication management.  She is doing OK.  States that she has been going to the gym and working out trying to establish her new hobbies as recommended.  She is still reporting problems with irritability and anger reaction.  She is requesting an increase of her Lamictal .  Reporting no depression. Says her depression today is 3/10 and anxiety 2/10. She is sleeping 7-8 hours per night. She denies any current or prior mania.No psychosis, no auditory or visual hallucinations. Strong family hx of dementia. No SI or HI.   Prior psychiatric medication trials:   Abilify Latuda Risperdone Amitriptyline  Trintellix Lexapro Prozac Zoloft Paxil Wellbutrin  Remeron Effexor Cymbalta  Rexulti    Individual Medical History/ Review of Systems: Changes? :No  Allergies: Gabapentin , Mirtazapine, Pregabalin, Oxycodone, and Oxycodone hcl  Current Medications: Current Medications[1] Medication Side Effects: none  Family Medical/ Social History: Changes? No  MENTAL HEALTH EXAM:  There were no vitals taken for this visit.There is no height or weight on file to calculate BMI.  General Appearance: Casual, Neat, and Well Groomed  Eye Contact:  Good  Speech:  Clear and Coherent  Volume:  Normal  Mood:  NA  Affect:  Appropriate  Thought Process:  Coherent  Orientation:  Full (Time, Place, and Person)  Thought Content: Logical   Suicidal Thoughts:  No  Homicidal Thoughts:  No  Memory:  WNL  Judgement:  Good  Insight:  Good  Psychomotor Activity:  Normal  Concentration:  Concentration: Good  Recall:   Good  Fund of Knowledge: Good  Language: Good  Assets:  Desire for Improvement  ADL's:  Intact  Cognition: WNL  Prognosis:  Good    DIAGNOSES:    ICD-10-CM   1. Generalized anxiety disorder  F41.1 busPIRone  (BUSPAR ) 30 MG tablet    DULoxetine  (CYMBALTA ) 60 MG capsule    traZODone  (DESYREL ) 50 MG tablet    2. Major depressive disorder, recurrent episode, moderate (HCC)  F33.1 busPIRone  (BUSPAR ) 30 MG tablet    DULoxetine  (CYMBALTA ) 60 MG capsule    traZODone  (DESYREL ) 50 MG tablet    3. Attention deficit hyperactivity disorder (ADHD), predominantly inattentive type  F90.0 traZODone  (DESYREL ) 50 MG tablet    4. Insomnia due to other mental disorder  F51.05    F99     5. Multiple sclerosis  G35.D     6. Irritability  R45.4 lamoTRIgine  (LAMICTAL ) 100 MG tablet    7. Anger reaction  R45.4 lamoTRIgine  (LAMICTAL ) 100 MG tablet    8. Unspecified mood (affective) disorder  F39 lamoTRIgine  (LAMICTAL ) 100 MG tablet      Receiving Psychotherapy: No    RECOMMENDATIONS:   Greater than 50% of 30 face to face time with patient was spent on counseling and coordination of care. We discussed her current stability.  We discussed her concerns about irritability and anger reaction.  She is being proactive in trying to establish new hobbies to stay busy.  Talked about seasonal affect and her subtle decline.  Patient has vacation planned for the summer going on a cruise.   At risk for loneliness.  We agreed to:   Increase Lamictal  to 100 mg daily divided into equal doses twice daily.  Will continue Cymbalta  60 mg twice daily To continue Trazodone  50 mg at bedtime as reported by pt Continue Buspar  to 30 mg twice daily, at least 7-8 hours between doses. Continue Xanax  0.5 mg twice daily and additional 1/2 tablet for severe anxiety only.  To report worsening symptoms or side effects promptly Will follow up in 3 months to reassess Provided emergency contact information Discussed potential  metabolic side effects associated with atypical antipsychotics, as well as potential risk for movement side effects. Advised pt to contact office if movement side effects occur.   Reviewed PDMP   Jordan DELENA Pizza, NP      [1]  Current Outpatient Medications:    lamoTRIgine  (LAMICTAL ) 100 MG tablet, Take 1 tablet (100 mg total) by mouth daily., Disp: 30 tablet, Rfl: 2   ALPRAZolam  (XANAX ) 0.5 MG tablet, Take one tablet by mouth twice daily and an additional 1/2 tablet as needed for severe anxiety., Disp: 75 tablet, Rfl: 5   busPIRone  (BUSPAR ) 30 MG tablet, Take 1 tablet (30 mg total) by mouth 2 (two) times daily., Disp: 180 tablet, Rfl: 0   Cholecalciferol (VITAMIN D3) 125 MCG (5000 UT) CAPS, TAKE 1 CAPSULE BY MOUTH DAILY, Disp: 100 capsule, Rfl: 4   Cyanocobalamin  (B-12) 1000 MCG SUBL, Place 1 tablet under the tongue daily at 6 (six) AM., Disp: 90 tablet, Rfl: 3   dicyclomine  (BENTYL ) 10 MG capsule, TAKE 1 CAPSULE BY MOUTH EVERY 6 HOURS AS NEEDED FOR ABDOMINAL PAIN, Disp: 30 capsule, Rfl: 1   DULoxetine  (CYMBALTA ) 60 MG capsule, Take 1 capsule (60 mg total) by mouth 2 (two) times daily., Disp: 200 capsule, Rfl: 1   esomeprazole  (NEXIUM ) 40 MG capsule, Take 1 capsule (40 mg total) by mouth 2 (two) times daily before a meal. Please schedule an office visit for further refills. Thank you, Disp: 100 capsule, Rfl: 0   fluticasone  (FLONASE ) 50 MCG/ACT nasal spray, SPRAY 1 SPRAY IN EACH NOSTRIL ONCE DAILY AS NEEDED FOR ALLERGIES OR RHINITIS, Disp: 16 mL, Rfl: 2   Omega-3 Fatty Acids (FISH OIL ) 1000 MG CAPS, Take 2 capsules (2,000 mg total) by mouth 2 (two) times daily before lunch and supper., Disp: 360 capsule, Rfl: 3   ondansetron  (ZOFRAN -ODT) 4 MG disintegrating tablet, Take 1 tablet (4 mg total) by mouth every 8 (eight) hours as needed., Disp: 20 tablet, Rfl: 6   rizatriptan  (MAXALT ) 10 MG tablet, Take 1 tablet (10 mg total) by mouth as needed for migraine. May repeat in 2 hours if needed, Disp: 10  tablet, Rfl: 11   tiZANidine  (ZANAFLEX ) 2 MG tablet, Take 1 tablet (2 mg total) by mouth every 8 (eight) hours as needed for muscle spasms., Disp: 30 tablet, Rfl: 5   traZODone  (DESYREL ) 50 MG tablet, Take 1 tablet (50 mg total) by mouth at bedtime., Disp: 90 tablet, Rfl: 3  Current Facility-Administered Medications:    betamethasone  acetate-betamethasone  sodium phosphate (CELESTONE ) injection 9 mg, 9 mg, Intramuscular, Once, Onita Duos, MD   bupivacaine (PF) (MARCAINE ) 0.5 % injection 7.5 mL, 7.5 mL, Other, Once, Onita Duos, MD  "

## 2024-03-12 ENCOUNTER — Other Ambulatory Visit: Payer: Self-pay

## 2024-03-12 ENCOUNTER — Ambulatory Visit (INDEPENDENT_AMBULATORY_CARE_PROVIDER_SITE_OTHER): Admitting: Physician Assistant

## 2024-03-12 DIAGNOSIS — J329 Chronic sinusitis, unspecified: Secondary | ICD-10-CM

## 2024-03-12 DIAGNOSIS — L918 Other hypertrophic disorders of the skin: Secondary | ICD-10-CM

## 2024-03-12 NOTE — Telephone Encounter (Signed)
 placed

## 2024-03-13 ENCOUNTER — Ambulatory Visit: Admitting: Psychology

## 2024-03-13 DIAGNOSIS — F331 Major depressive disorder, recurrent, moderate: Secondary | ICD-10-CM

## 2024-03-13 NOTE — Progress Notes (Deleted)
 "  [facility] Counselor/Therapist Progress Note  Patient ID: EMMALIN JAQUESS, MRN: [id number],    Date: 03/13/2024  Time Spent: 4:00pm-4:50pm   50 minutes   Treatment Type: Individual Therapy  Reported Symptoms: stress, sadness, loneliness  Mental Status Exam: Appearance:  Casual     Behavior: Appropriate  Motor: Normal  Speech/Language:  Normal Rate  Affect: Appropriate  Mood: normal  Thought process: normal  Thought content:   WNL  Sensory/Perceptual disturbances:   WNL  Orientation: oriented to person, place, time/date, and situation  Attention: Good  Concentration: Good  Memory: WNL  Fund of knowledge:  Good  Insight:   Good  Judgment:  Good  Impulse Control: Good   Risk Assessment: Danger to Self:  No Self-injurious Behavior: No Danger to Others: No Duty to Warn:no Physical Aggression / Violence:No  Access to Firearms a concern: No  Gang Involvement:No   Subjective: Pt present for face-to-face individual therapy via video.  Pt consents to telehealth video session and is aware of limitations and benefits of virtual sessions.  Location of pt: home Location of therapist: home office.  Pt talked about  Worked on self care strategies.  Provided supportive therapy.     Interventions: Cognitive Behavioral Therapy and Insight-Oriented  Diagnosis:  F33.1  Plan of Care: Recommend ongoing therapy.  Pt participated in setting treatment goals.  She wants to improve coping skills and self esteem.   Plan to meet monthly.  Pt agrees with treatment plan.   Treatment Plan (Treatment Plan Target Date:  06/27/2024) Client Abilities/Strengths  Pt is bright, engaging, and motivated for therapy.   Client Treatment Preferences  Individual therapy.  Client Statement of Needs  Improve coping skills.  Symptoms  Depressed or irritable mood. Low self-esteem. Unresolved grief issues.   Problems Addressed  Unipolar Depression Goals 1. Alleviate depressive  symptoms and return to previous level of effective functioning. 2. Appropriately grieve the loss in order to normalize mood and to return to previously adaptive level of functioning. Objective Learn and implement behavioral strategies to overcome depression. Target Date: 2024-06-27 Frequency: Monthly  Progress: 55 Modality: individual  Related Interventions Engage the client in behavioral activation, increasing his/her activity level and contact with sources of reward, while identifying processes that inhibit activation.  Use behavioral techniques such as instruction, rehearsal, role-playing, role reversal, as needed, to facilitate activity in the client's daily life; reinforce success. Assist the client in developing skills that increase the likelihood of deriving pleasure from behavioral activation (e.g., assertiveness skills, developing an exercise plan, less internal/more external focus, increased social involvement); reinforce success. Objective Identify important people in life, past and present, and describe the quality, good and poor, of those relationships. Target Date: 2024-06-27 Frequency: Monthly  Progress: 55 Modality: individual  Related Interventions Conduct Interpersonal Therapy beginning with the assessment of the client's interpersonal inventory of important past and present relationships; develop a case formulation linking depression to grief, interpersonal role disputes, role transitions, and/or interpersonal deficits). Objective Learn and implement problem-solving and decision-making skills. Target Date: 2024-06-27 Frequency: Monthly  Progress: 55 Modality: individual  Related Interventions Conduct Problem-Solving Therapy using techniques such as psychoeducation, modeling, and role-playing to teach client problem-solving skills (i.e., defining a problem specifically, generating possible solutions, evaluating the pros and cons of each solution, selecting and implementing a  plan of action, evaluating the efficacy of the plan, accepting or revising the plan); role-play application of the problem-solving skill to a real life issue. Encourage in the client the development  of a positive problem orientation in which problems and solving them are viewed as a natural part of life and not something to be feared, despaired, or avoided. 3. Develop healthy interpersonal relationships that lead to the alleviation and help prevent the relapse of depression. 4. Develop healthy thinking patterns and beliefs about self, others, and the world that lead to the alleviation and help prevent the relapse of depression. 5. Recognize, accept, and cope with feelings of depression. Diagnosis F33.1  Conditions For Discharge Achievement of treatment goals and objectives   Veva Alma, LCSW   "

## 2024-03-14 ENCOUNTER — Encounter (INDEPENDENT_AMBULATORY_CARE_PROVIDER_SITE_OTHER): Payer: Self-pay

## 2024-03-14 ENCOUNTER — Telehealth (INDEPENDENT_AMBULATORY_CARE_PROVIDER_SITE_OTHER): Payer: Self-pay | Admitting: Physician Assistant

## 2024-03-14 NOTE — Telephone Encounter (Signed)
 Left voice message and sent MyChart message for patient to call to  reschedule 03/17/2024 appointment - the office will not be open due to adverse weather conditions.

## 2024-03-17 ENCOUNTER — Ambulatory Visit (INDEPENDENT_AMBULATORY_CARE_PROVIDER_SITE_OTHER): Admitting: Physician Assistant

## 2024-03-18 ENCOUNTER — Ambulatory Visit (INDEPENDENT_AMBULATORY_CARE_PROVIDER_SITE_OTHER): Admitting: Physician Assistant

## 2024-03-20 ENCOUNTER — Ambulatory Visit (INDEPENDENT_AMBULATORY_CARE_PROVIDER_SITE_OTHER): Admitting: Physician Assistant

## 2024-03-20 ENCOUNTER — Ambulatory Visit: Admitting: Adult Health

## 2024-03-22 ENCOUNTER — Other Ambulatory Visit: Payer: Self-pay | Admitting: Behavioral Health

## 2024-03-22 DIAGNOSIS — F411 Generalized anxiety disorder: Secondary | ICD-10-CM

## 2024-03-22 DIAGNOSIS — R454 Irritability and anger: Secondary | ICD-10-CM

## 2024-03-31 ENCOUNTER — Ambulatory Visit (INDEPENDENT_AMBULATORY_CARE_PROVIDER_SITE_OTHER): Admitting: Physician Assistant

## 2024-04-02 ENCOUNTER — Ambulatory Visit: Admitting: Gastroenterology

## 2024-04-21 ENCOUNTER — Ambulatory Visit

## 2024-04-23 ENCOUNTER — Institutional Professional Consult (permissible substitution) (INDEPENDENT_AMBULATORY_CARE_PROVIDER_SITE_OTHER)

## 2024-06-09 ENCOUNTER — Ambulatory Visit: Admitting: Behavioral Health
